# Patient Record
Sex: Male | Born: 1940
Health system: Southern US, Community
[De-identification: ages and names within clinical notes are randomized; demographics above are authoritative.]

## PROBLEM LIST (undated history)

## (undated) DIAGNOSIS — G4730 Sleep apnea, unspecified: Secondary | ICD-10-CM

## (undated) DIAGNOSIS — I10 Essential (primary) hypertension: Secondary | ICD-10-CM

## (undated) DIAGNOSIS — I639 Cerebral infarction, unspecified: Secondary | ICD-10-CM

## (undated) DIAGNOSIS — I480 Paroxysmal atrial fibrillation: Secondary | ICD-10-CM

## (undated) DIAGNOSIS — R609 Edema, unspecified: Secondary | ICD-10-CM

## (undated) DIAGNOSIS — IMO0002 Reserved for concepts with insufficient information to code with codable children: Secondary | ICD-10-CM

## (undated) DIAGNOSIS — I739 Peripheral vascular disease, unspecified: Secondary | ICD-10-CM

## (undated) DIAGNOSIS — J449 Chronic obstructive pulmonary disease, unspecified: Secondary | ICD-10-CM

## (undated) DIAGNOSIS — I2510 Atherosclerotic heart disease of native coronary artery without angina pectoris: Secondary | ICD-10-CM

## (undated) DIAGNOSIS — E785 Hyperlipidemia, unspecified: Secondary | ICD-10-CM

## (undated) DIAGNOSIS — I219 Acute myocardial infarction, unspecified: Secondary | ICD-10-CM

## (undated) DIAGNOSIS — K219 Gastro-esophageal reflux disease without esophagitis: Secondary | ICD-10-CM

## (undated) DIAGNOSIS — M1990 Unspecified osteoarthritis, unspecified site: Secondary | ICD-10-CM

## (undated) DIAGNOSIS — E119 Type 2 diabetes mellitus without complications: Secondary | ICD-10-CM

## (undated) DIAGNOSIS — I499 Cardiac arrhythmia, unspecified: Secondary | ICD-10-CM

## (undated) HISTORY — PX: TONSILLECTOMY: SUR1361

## (undated) HISTORY — PX: CORONARY ANGIOPLASTY WITH STENT PLACEMENT: SHX49

## (undated) HISTORY — PX: APPENDECTOMY: SHX54

## (undated) HISTORY — PX: BACK SURGERY: SHX140

## (undated) HISTORY — PX: CATARACT EXTRACTION W/ INTRAOCULAR LENS  IMPLANT, BILATERAL: SHX1307

## (undated) HISTORY — PX: HEMORRHOID SURGERY: SHX153

## (undated) SURGERY — PERIPHERAL VASCULAR THROMBECTOMY
Anesthesia: LOCAL

---

## 1976-06-23 HISTORY — PX: POSTERIOR LUMBAR FUSION: SHX6036

## 1979-06-24 HISTORY — PX: ANTERIOR LUMBAR FUSION: SHX1170

## 1994-06-23 DIAGNOSIS — I219 Acute myocardial infarction, unspecified: Secondary | ICD-10-CM

## 1994-06-23 HISTORY — DX: Acute myocardial infarction, unspecified: I21.9

## 2000-05-20 ENCOUNTER — Ambulatory Visit (HOSPITAL_BASED_OUTPATIENT_CLINIC_OR_DEPARTMENT_OTHER): Admission: RE | Admit: 2000-05-20 | Discharge: 2000-05-20 | Payer: Self-pay | Admitting: General Surgery

## 2011-12-10 DIAGNOSIS — E785 Hyperlipidemia, unspecified: Secondary | ICD-10-CM | POA: Diagnosis not present

## 2011-12-10 DIAGNOSIS — I10 Essential (primary) hypertension: Secondary | ICD-10-CM | POA: Diagnosis not present

## 2011-12-10 DIAGNOSIS — I739 Peripheral vascular disease, unspecified: Secondary | ICD-10-CM | POA: Diagnosis not present

## 2011-12-10 DIAGNOSIS — I2510 Atherosclerotic heart disease of native coronary artery without angina pectoris: Secondary | ICD-10-CM | POA: Diagnosis not present

## 2013-03-10 DIAGNOSIS — I70219 Atherosclerosis of native arteries of extremities with intermittent claudication, unspecified extremity: Secondary | ICD-10-CM | POA: Diagnosis not present

## 2013-03-10 DIAGNOSIS — T82898A Other specified complication of vascular prosthetic devices, implants and grafts, initial encounter: Secondary | ICD-10-CM | POA: Diagnosis not present

## 2013-03-11 DIAGNOSIS — I70309 Unspecified atherosclerosis of unspecified type of bypass graft(s) of the extremities, unspecified extremity: Secondary | ICD-10-CM | POA: Diagnosis not present

## 2013-03-11 DIAGNOSIS — T82898A Other specified complication of vascular prosthetic devices, implants and grafts, initial encounter: Secondary | ICD-10-CM | POA: Diagnosis not present

## 2013-03-12 DIAGNOSIS — T82898A Other specified complication of vascular prosthetic devices, implants and grafts, initial encounter: Secondary | ICD-10-CM | POA: Diagnosis not present

## 2013-03-13 DIAGNOSIS — T82898A Other specified complication of vascular prosthetic devices, implants and grafts, initial encounter: Secondary | ICD-10-CM | POA: Diagnosis not present

## 2014-07-02 ENCOUNTER — Emergency Department (HOSPITAL_COMMUNITY): Payer: Medicare Other

## 2014-07-02 ENCOUNTER — Observation Stay (HOSPITAL_COMMUNITY)
Admission: EM | Admit: 2014-07-02 | Discharge: 2014-07-04 | Disposition: A | Discharge status: Home / Self Care | Payer: Medicare Other | Attending: Internal Medicine | Admitting: Internal Medicine

## 2014-07-02 ENCOUNTER — Encounter (HOSPITAL_COMMUNITY): Payer: Self-pay | Admitting: Emergency Medicine

## 2014-07-02 DIAGNOSIS — R778 Other specified abnormalities of plasma proteins: Secondary | ICD-10-CM

## 2014-07-02 DIAGNOSIS — I2510 Atherosclerotic heart disease of native coronary artery without angina pectoris: Secondary | ICD-10-CM | POA: Insufficient documentation

## 2014-07-02 DIAGNOSIS — R6510 Systemic inflammatory response syndrome (SIRS) of non-infectious origin without acute organ dysfunction: Secondary | ICD-10-CM | POA: Diagnosis present

## 2014-07-02 DIAGNOSIS — I4891 Unspecified atrial fibrillation: Principal | ICD-10-CM | POA: Diagnosis present

## 2014-07-02 DIAGNOSIS — R000 Tachycardia, unspecified: Secondary | ICD-10-CM | POA: Diagnosis not present

## 2014-07-02 DIAGNOSIS — E1122 Type 2 diabetes mellitus with diabetic chronic kidney disease: Secondary | ICD-10-CM

## 2014-07-02 DIAGNOSIS — I25119 Atherosclerotic heart disease of native coronary artery with unspecified angina pectoris: Secondary | ICD-10-CM

## 2014-07-02 DIAGNOSIS — R748 Abnormal levels of other serum enzymes: Secondary | ICD-10-CM | POA: Diagnosis not present

## 2014-07-02 DIAGNOSIS — I480 Paroxysmal atrial fibrillation: Secondary | ICD-10-CM

## 2014-07-02 DIAGNOSIS — I959 Hypotension, unspecified: Secondary | ICD-10-CM

## 2014-07-02 DIAGNOSIS — R7989 Other specified abnormal findings of blood chemistry: Secondary | ICD-10-CM | POA: Diagnosis not present

## 2014-07-02 DIAGNOSIS — E119 Type 2 diabetes mellitus without complications: Secondary | ICD-10-CM | POA: Insufficient documentation

## 2014-07-02 DIAGNOSIS — E785 Hyperlipidemia, unspecified: Secondary | ICD-10-CM

## 2014-07-02 DIAGNOSIS — R262 Difficulty in walking, not elsewhere classified: Secondary | ICD-10-CM | POA: Insufficient documentation

## 2014-07-02 DIAGNOSIS — R05 Cough: Secondary | ICD-10-CM

## 2014-07-02 DIAGNOSIS — R079 Chest pain, unspecified: Secondary | ICD-10-CM | POA: Diagnosis present

## 2014-07-02 DIAGNOSIS — A419 Sepsis, unspecified organism: Secondary | ICD-10-CM | POA: Diagnosis not present

## 2014-07-02 DIAGNOSIS — I10 Essential (primary) hypertension: Secondary | ICD-10-CM

## 2014-07-02 DIAGNOSIS — N1831 Chronic kidney disease, stage 3a: Secondary | ICD-10-CM

## 2014-07-02 DIAGNOSIS — R059 Cough, unspecified: Secondary | ICD-10-CM

## 2014-07-02 DIAGNOSIS — N289 Disorder of kidney and ureter, unspecified: Secondary | ICD-10-CM

## 2014-07-02 DIAGNOSIS — D72829 Elevated white blood cell count, unspecified: Secondary | ICD-10-CM | POA: Diagnosis not present

## 2014-07-02 DIAGNOSIS — Z87891 Personal history of nicotine dependence: Secondary | ICD-10-CM | POA: Insufficient documentation

## 2014-07-02 DIAGNOSIS — R0789 Other chest pain: Secondary | ICD-10-CM | POA: Diagnosis not present

## 2014-07-02 HISTORY — DX: Hyperlipidemia, unspecified: E78.5

## 2014-07-02 HISTORY — DX: Unspecified osteoarthritis, unspecified site: M19.90

## 2014-07-02 HISTORY — DX: Cardiac arrhythmia, unspecified: I49.9

## 2014-07-02 HISTORY — DX: Edema, unspecified: R60.9

## 2014-07-02 HISTORY — DX: Paroxysmal atrial fibrillation: I48.0

## 2014-07-02 HISTORY — DX: Gastro-esophageal reflux disease without esophagitis: K21.9

## 2014-07-02 HISTORY — DX: Essential (primary) hypertension: I10

## 2014-07-02 HISTORY — DX: Atherosclerotic heart disease of native coronary artery without angina pectoris: I25.10

## 2014-07-02 HISTORY — DX: Reserved for concepts with insufficient information to code with codable children: IMO0002

## 2014-07-02 HISTORY — DX: Acute myocardial infarction, unspecified: I21.9

## 2014-07-02 HISTORY — DX: Peripheral vascular disease, unspecified: I73.9

## 2014-07-02 LAB — URINALYSIS, ROUTINE W REFLEX MICROSCOPIC
Glucose, UA: NEGATIVE mg/dL
HGB URINE DIPSTICK: NEGATIVE
Ketones, ur: NEGATIVE mg/dL
Leukocytes, UA: NEGATIVE
Nitrite: NEGATIVE
Protein, ur: NEGATIVE mg/dL
SPECIFIC GRAVITY, URINE: 1.021 (ref 1.005–1.030)
UROBILINOGEN UA: 0.2 mg/dL (ref 0.0–1.0)
pH: 5.5 (ref 5.0–8.0)

## 2014-07-02 LAB — APTT: aPTT: 44 seconds — ABNORMAL HIGH (ref 24–37)

## 2014-07-02 LAB — BASIC METABOLIC PANEL
Anion gap: 16 — ABNORMAL HIGH (ref 5–15)
BUN: 21 mg/dL (ref 6–23)
CHLORIDE: 105 meq/L (ref 96–112)
CO2: 19 mmol/L (ref 19–32)
CREATININE: 1.37 mg/dL — AB (ref 0.50–1.35)
Calcium: 9.3 mg/dL (ref 8.4–10.5)
GFR calc Af Amer: 57 mL/min — ABNORMAL LOW (ref >90)
GFR calc non Af Amer: 49 mL/min — ABNORMAL LOW (ref >90)
Glucose, Bld: 131 mg/dL — ABNORMAL HIGH (ref 70–99)
POTASSIUM: 4.7 mmol/L (ref 3.5–5.1)
SODIUM: 140 mmol/L (ref 135–145)

## 2014-07-02 LAB — I-STAT TROPONIN, ED
Troponin i, poc: 0.02 ng/mL (ref 0.00–0.08)
Troponin i, poc: 0.36 ng/mL (ref 0.00–0.08)
Troponin i, poc: 0.79 ng/mL (ref 0.00–0.08)

## 2014-07-02 LAB — PROTIME-INR
INR: 2.82 — AB (ref 0.00–1.49)
Prothrombin Time: 29.9 seconds — ABNORMAL HIGH (ref 11.6–15.2)

## 2014-07-02 LAB — CBC
HCT: 43.8 % (ref 39.0–52.0)
HEMOGLOBIN: 14.4 g/dL (ref 13.0–17.0)
MCH: 30.3 pg (ref 26.0–34.0)
MCHC: 32.9 g/dL (ref 30.0–36.0)
MCV: 92.2 fL (ref 78.0–100.0)
Platelets: 286 10*3/uL (ref 150–400)
RBC: 4.75 MIL/uL (ref 4.22–5.81)
RDW: 13.2 % (ref 11.5–15.5)
WBC: 27.3 10*3/uL — ABNORMAL HIGH (ref 4.0–10.5)

## 2014-07-02 LAB — TROPONIN I: TROPONIN I: 1.25 ng/mL — AB (ref <0.031)

## 2014-07-02 MED ORDER — INSULIN ASPART 100 UNIT/ML ~~LOC~~ SOLN
0.0000 [IU] | Freq: Three times a day (TID) | SUBCUTANEOUS | Status: DC
  Administered 2014-07-04: 2 [IU] via SUBCUTANEOUS

## 2014-07-02 MED ORDER — ASPIRIN EC 81 MG PO TBEC
81.0000 mg | DELAYED_RELEASE_TABLET | Freq: Every day | ORAL | Status: DC
  Administered 2014-07-03: 81 mg via ORAL
  Filled 2014-07-02: qty 1

## 2014-07-02 MED ORDER — WARFARIN SODIUM 5 MG PO TABS
5.0000 mg | ORAL_TABLET | ORAL | Status: AC
  Administered 2014-07-02: 5 mg via ORAL
  Filled 2014-07-02: qty 1

## 2014-07-02 MED ORDER — METOPROLOL TARTRATE 50 MG PO TABS
50.0000 mg | ORAL_TABLET | Freq: Two times a day (BID) | ORAL | Status: DC
  Administered 2014-07-02 – 2014-07-04 (×4): 50 mg via ORAL
  Filled 2014-07-02: qty 1
  Filled 2014-07-02: qty 2
  Filled 2014-07-02: qty 1
  Filled 2014-07-02: qty 2

## 2014-07-02 MED ORDER — TRAMADOL HCL 50 MG PO TABS: 50.0000 mg | ORAL_TABLET | Freq: Four times a day (QID) | ORAL | Status: DC | PRN

## 2014-07-02 MED ORDER — WARFARIN - PHARMACIST DOSING INPATIENT
Freq: Every day | Status: DC
Start: 2014-07-03 — End: 2014-07-03

## 2014-07-02 MED ORDER — LOSARTAN POTASSIUM 50 MG PO TABS
50.0000 mg | ORAL_TABLET | Freq: Two times a day (BID) | ORAL | Status: DC
  Administered 2014-07-02: 50 mg via ORAL
  Filled 2014-07-02 (×4): qty 1

## 2014-07-02 MED ORDER — CHLORTHALIDONE 25 MG PO TABS: 6.0000 mg | ORAL_TABLET | Freq: Every day | ORAL | Status: DC

## 2014-07-02 MED ORDER — DILTIAZEM HCL 25 MG/5ML IV SOLN
10.0000 mg | Freq: Once | INTRAVENOUS | Status: AC
  Administered 2014-07-02: 10 mg via INTRAVENOUS
  Filled 2014-07-02: qty 5

## 2014-07-02 MED ORDER — ASPIRIN 81 MG PO CHEW
324.0000 mg | CHEWABLE_TABLET | Freq: Once | ORAL | Status: AC
  Administered 2014-07-03: 324 mg via ORAL
  Filled 2014-07-02: qty 4

## 2014-07-02 MED ORDER — SODIUM CHLORIDE 0.9 % IV SOLN
1000.0000 mL | INTRAVENOUS | Status: DC
  Administered 2014-07-02: 1000 mL via INTRAVENOUS

## 2014-07-02 MED ORDER — ASPIRIN 81 MG PO CHEW: 324.0000 mg | CHEWABLE_TABLET | Freq: Once | ORAL | Status: DC

## 2014-07-02 MED ORDER — ACETAMINOPHEN 325 MG PO TABS
650.0000 mg | ORAL_TABLET | ORAL | Status: DC | PRN
  Administered 2014-07-04: 650 mg via ORAL
  Filled 2014-07-02: qty 2

## 2014-07-02 MED ORDER — OMEGA-3-ACID ETHYL ESTERS 1 G PO CAPS
1.0000 g | ORAL_CAPSULE | Freq: Every day | ORAL | Status: DC
  Administered 2014-07-03 – 2014-07-04 (×2): 1 g via ORAL
  Filled 2014-07-02 (×4): qty 1

## 2014-07-02 MED ORDER — EZETIMIBE 10 MG PO TABS
5.0000 mg | ORAL_TABLET | Freq: Every day | ORAL | Status: DC
  Administered 2014-07-03 – 2014-07-04 (×2): 5 mg via ORAL
  Filled 2014-07-02 (×2): qty 0.5
  Filled 2014-07-02: qty 1
  Filled 2014-07-02: qty 0.5

## 2014-07-02 MED ORDER — SODIUM CHLORIDE 0.9 % IJ SOLN
3.0000 mL | Freq: Two times a day (BID) | INTRAMUSCULAR | Status: DC
  Administered 2014-07-02 – 2014-07-04 (×4): 3 mL via INTRAVENOUS
  Filled 2014-07-02: qty 3

## 2014-07-02 MED ORDER — SODIUM CHLORIDE 0.9 % IV BOLUS (SEPSIS)
500.0000 mL | Freq: Once | INTRAVENOUS | Status: AC
  Administered 2014-07-02: 500 mL via INTRAVENOUS

## 2014-07-02 MED ORDER — ONDANSETRON HCL 4 MG/2ML IJ SOLN: 4.0000 mg | Freq: Four times a day (QID) | INTRAMUSCULAR | Status: DC | PRN

## 2014-07-02 MED ORDER — CLOPIDOGREL BISULFATE 75 MG PO TABS
75.0000 mg | ORAL_TABLET | Freq: Every day | ORAL | Status: DC
  Administered 2014-07-03 – 2014-07-04 (×2): 75 mg via ORAL
  Filled 2014-07-02 (×2): qty 1

## 2014-07-02 MED ORDER — GABAPENTIN 300 MG PO CAPS
300.0000 mg | ORAL_CAPSULE | Freq: Every day | ORAL | Status: DC
  Administered 2014-07-03 – 2014-07-04 (×2): 300 mg via ORAL
  Filled 2014-07-02 (×2): qty 1

## 2014-07-02 MED ORDER — NITROGLYCERIN 0.4 MG SL SUBL: 0.4000 mg | SUBLINGUAL_TABLET | SUBLINGUAL | Status: DC | PRN

## 2014-07-02 MED ORDER — PANTOPRAZOLE SODIUM 40 MG PO TBEC
40.0000 mg | DELAYED_RELEASE_TABLET | Freq: Every day | ORAL | Status: DC
  Administered 2014-07-03 – 2014-07-04 (×2): 40 mg via ORAL
  Filled 2014-07-02 (×2): qty 1

## 2014-07-02 NOTE — Progress Notes (Signed)
ANTICOAGULATION CONSULT NOTE - Initial Consult  Pharmacy Consult for Coumadin Indication: atrial fibrillation  Allergies  Allergen Reactions  . Statins Other (See Comments)    Muscle and Bone pain    Patient Measurements: Height: 5\' 9"  (175.3 cm) Weight: 166 lb (75.297 kg) IBW/kg (Calculated) : 70.7  Vital Signs: Temp: 97.7 F (36.5 C) (01/10 1702) Temp Source: Oral (01/10 1702) BP: 116/53 mmHg (01/10 2200) Pulse Rate: 85 (01/10 2200)  Labs:  Recent Labs  07/02/14 1150  HGB 14.4  HCT 43.8  PLT 286  APTT 44*  LABPROT 29.9*  INR 2.82*  CREATININE 1.37*    Estimated Creatinine Clearance: 47.3 mL/min (by C-G formula based on Cr of 1.37).   Medical History: Past Medical History  Diagnosis Date  . PAF (paroxysmal atrial fibrillation)   . Coronary artery disease     a. MI s/p balloon 1996, details unclear.  . Hypertension   . Diabetes mellitus without complication   . PAD (peripheral artery disease)     a. s/p stenting 08/2012, 02/2013.   . Thrombosis of lower extremity     a. Listed on patient's medical bracelet - at [place].  Marland Kitchen Hyperlipidemia   . Chronic edema     a. Chronic RLE edema.    Assessment: 74 year old male admitted on Coumadin PTA for Afib admitted with Afib with RVR and cold congestion type symptoms  INR = 2.82 on admission Home dose = 5 mg on Tuesday, 7.5 mg other days (last dose 1/9)  Goal of Therapy:  INR 2-3 Monitor platelets by anticoagulation protocol: Yes   Plan:  Coumadin 5 mg po x 1 dose now (INR on the high end) Daily INR  Thank you. Anette Guarneri, PharmD [phone]  07/02/2014,10:22 PM

## 2014-07-02 NOTE — ED Provider Notes (Signed)
  Physical Exam  BP 122/63 mmHg  Pulse 80  Temp(Src) 97.7 F (36.5 C) (Oral)  Resp 20  Ht 5\' 9"  (1.753 m)  Wt 166 lb (75.297 kg)  BMI 24.50 kg/m2  SpO2 95%  Physical Exam  ED Course  Procedures  MDM Cardiology called me about Mr. Brabson. States that they have seen the patient and since he has a white count of 27 now requested he be admitted to internal medicine. Patient had a cough a month and half ago but has not had any since. Denies any changes in his urinary symptoms. No nausea vomiting diarrhea.      Jasper Riling. Alvino Chapel, MD 07/02/14 [phone]

## 2014-07-02 NOTE — ED Notes (Signed)
Dr Alvino Chapel given a copy of troponin results .[place]

## 2014-07-02 NOTE — H&P (Signed)
[facility] History and Physical  Donald Gordon VPX:[id number] DOB: May 29, 1941 DOA: 07/02/2014  Referring physician: ER physician. PCP: No PCP Per Patient   Chief Complaint: Cold sweaty and congestion.  HPI: Donald Gordon is a 74 y.o. male with history of atrial fibrillation, peripheral vascular disease status post stenting, CAD status post balloon angioplasty in 1996, hypertension and diabetes mellitus type 2 was brought to the ER after patient was having some chest congestion and palpitation. Patient was found to be in atrial fibrillation with RVR and patient was given Cardizem bolus following which patient reverted back to normal sinus rhythm. Patient's troponin was found to be elevated and on call cardiologist was consulted. Patient states that he woke up this morning around 1 AM with cold and chills and aching elbows. Following which eventually patient started developing some congestion by a morning with some palpitation. Patient took some sublingual nitroglycerin and aspirin and called EMS and patient was brought to the ER. In the ER patient was found to be in A. fib with RVR which converted back to sinus rhythm after Cardizem bolus. Patient denies any nausea vomiting abdominal pain diarrhea productive cough. Does not have any dysuria or discharge. Patient's lab work showed elevated troponin with markedly elevated WBC count. Patient has been afebrile. Chest x-ray and UA was unremarkable.   Review of Systems: As presented in the history of presenting illness, rest negative.  Past Medical History  Diagnosis Date  . PAF (paroxysmal atrial fibrillation)   . Coronary artery disease     a. MI s/p balloon 1996, details unclear.  . Hypertension   . Diabetes mellitus without complication   . PAD (peripheral artery disease)     a. s/p stenting 08/2012, 02/2013.   . Thrombosis of lower extremity     a. Listed on patient's medical bracelet - at [place].  Marland Kitchen Hyperlipidemia   . Chronic edema     a.  Chronic RLE edema.   Past Surgical History  Procedure Laterality Date  . Back surgery  1978, 1981   Social History:  reports that he has quit smoking. He does not have any smokeless tobacco history on file. He reports that he does not drink alcohol or use illicit drugs. Where does patient live home. Can patient participate in ADLs? Yes.  Allergies  Allergen Reactions  . Statins Other (See Comments)    Muscle and Bone pain    Family History:  Family History  Problem Relation Age of Onset  . CAD Mother   . CAD Brother   . Hypertension        Prior to Admission medications   Medication Sig Start Date End Date Taking? Authorizing Provider  aspirin 325 MG tablet Take 325 mg by mouth daily.   Yes Historical Provider, MD  chlorthalidone (HYGROTON) 25 MG tablet Take 6 mg by mouth daily. Pt breaks med up into 1/4 tabs   Yes Historical Provider, MD  clopidogrel (PLAVIX) 75 MG tablet Take 75 mg by mouth daily.   Yes Historical Provider, MD  ezetimibe (ZETIA) 10 MG tablet Take 5 mg by mouth daily.   Yes Historical Provider, MD  gabapentin (NEURONTIN) 300 MG capsule Take 300 mg by mouth daily.   Yes Historical Provider, MD  losartan (COZAAR) 50 MG tablet Take 50 mg by mouth 2 (two) times daily.   Yes Historical Provider, MD  metFORMIN (GLUCOPHAGE) 500 MG tablet Take 500 mg by mouth 2 (two) times daily with a meal.   Yes  Historical Provider, MD  metoprolol (LOPRESSOR) 50 MG tablet Take 50 mg by mouth 2 (two) times daily.   Yes Historical Provider, MD  Multiple Vitamins-Minerals (MULTIVITAMIN WITH MINERALS) tablet Take 1 tablet by mouth daily.   Yes Historical Provider, MD  nitroGLYCERIN (NITROSTAT) 0.4 MG SL tablet Place 0.4 mg under the tongue every 5 (five) minutes as needed for chest pain.   Yes Historical Provider, MD  Omega-3 Fatty Acids (FISH OIL PO) Take 1 capsule by mouth daily.   Yes Historical Provider, MD  pantoprazole (PROTONIX) 40 MG tablet Take 40 mg by mouth daily.   Yes  Historical Provider, MD  traMADol (ULTRAM) 50 MG tablet Take 50 mg by mouth every 6 (six) hours as needed for moderate pain.   Yes Historical Provider, MD  warfarin (COUMADIN) 5 MG tablet Take 5-7.5 mg by mouth daily. Takes 5 mg on Tuesday, all other days takes 7.5 mg   Yes Historical Provider, MD    Physical Exam: Filed Vitals:   07/02/14 1800 07/02/14 1830 07/02/14 1901 07/02/14 1956  BP: 109/86 131/60 139/58 123/64  Pulse: 81 85 86 89  Temp:      TempSrc:      Resp: 17 21 22 19   Height:      Weight:      SpO2: 99% 96% 94% 95%     General:  Moderately built and nourished.  Eyes: Anicteric no pallor.  ENT: No discharge from the ears eyes nose or mouth.  Neck: No mass felt.  Cardiovascular: S1 and S2 heard.  Respiratory: No rhonchi or crepitations.  Abdomen: Soft nontender bowel sounds present.  Skin: No rash.  Musculoskeletal: No edema.  Psychiatric: Appears normal.  Neurologic: Alert awake oriented to time place and person. Moves all extremities. No facial asymmetry. Tongue is midline.  Labs on Admission:  Basic Metabolic Panel:  Recent Labs Lab 07/02/14 1150  NA 140  K 4.7  CL 105  CO2 19  GLUCOSE 131*  BUN 21  CREATININE 1.37*  CALCIUM 9.3   Liver Function Tests: No results for input(s): AST, ALT, ALKPHOS, BILITOT, PROT, ALBUMIN in the last 168 hours. No results for input(s): LIPASE, AMYLASE in the last 168 hours. No results for input(s): AMMONIA in the last 168 hours. CBC:  Recent Labs Lab 07/02/14 1150  WBC 27.3*  HGB 14.4  HCT 43.8  MCV 92.2  PLT 286   Cardiac Enzymes: No results for input(s): CKTOTAL, CKMB, CKMBINDEX, TROPONINI in the last 168 hours.  BNP (last 3 results) No results for input(s): PROBNP in the last 8760 hours. CBG: No results for input(s): GLUCAP in the last 168 hours.  Radiological Exams on Admission: Dg Chest Port 1 View  07/02/2014   CLINICAL DATA:  Chest pain, tachycardia  EXAM: PORTABLE CHEST - 1 VIEW   COMPARISON:  None.  FINDINGS: Chronic interstitial markings. No focal consolidation. No pleural effusion or pneumothorax.  The heart is top-normal in size.  IMPRESSION: No evidence of acute cardiopulmonary disease.   Electronically Signed   By: Julian Hy M.D.   On: 07/02/2014 13:39    EKG: Independently reviewed. First EKG showed A. fib with RVR. Second EKG shows normal sinus rhythm with nonspecific ST-T changes.  Assessment/Plan Principal Problem:   Atrial fibrillation with RVR Active Problems:   Leukocytosis   Paroxysmal atrial fibrillation - with RVR   Elevated troponin   Renal insufficiency   Essential hypertension   Hypotension   CAD (coronary artery disease)   Cough  Hyperlipemia   SIRS (systemic inflammatory response syndrome)   Diabetes mellitus type 2, controlled   1. Atrial fibrillation with RVR - at this time is converted back to normal sinus rhythm. Patient is on metoprolol which will be continued. Coumadin per pharmacy. If patient's creatinine worsened and may change to heparin infusion in anticipation of possible cardiac procedures. 2. Elevated troponin with history of CAD - at this time we will cycle cardiac markers. Check 2-D echo. Patient is on Plavix and aspirin. Presently chest pain-free. 3. SIRS - patient has markedly elevated white blood cell count. Patient is afebrile. Patient does not have any history of leukemia. Blood cultures have been obtained. Closely follow CBC trends. Check influenza PCR. 4. Hypertension - since patient has low normal blood pressure I'm holding off Cozaar and HCT for now and closely follow blood pressure trends. 5. Diabetes mellitus type 2 - since patient's creatinine is mildly elevated and in anticipation of possible cardiac procedures we will hold off metformin for now. Sliding scale coverage. 6. Renal insufficiency duration not clear - closely follow intake output and metabolic panel. 7. Peripheral vascular disease status post  stenting - presently on anticoagulants and antiplatelet agents.  Appreciate cardiology consult.   DVT Prophylaxis patient is on full dose anticoagulants.  Code Status: Full code.  Family Communication: None.  Disposition Plan: Admit to inpatient.    Mackinzie Vuncannon N. [facility] Pager [phone].  If 7PM-7AM, please contact night-coverage [email or web address] Password [email or web address] 07/02/2014, 9:08 PM

## 2014-07-02 NOTE — ED Notes (Signed)
EKG completed and given to EDP.  

## 2014-07-02 NOTE — ED Notes (Signed)
Patient states does not feel any pain 0/10 checked own pulses and said " I bet my heart is back in rhythm." Alert answering and following commands appropriate.

## 2014-07-02 NOTE — ED Notes (Signed)
Onset today at midnight chest pain 4/10 feels palpitations called EMS Afib rvr per EMS patient took own aspirin 618ms aspirin prior to arrival. Alert answering and following commands appropriate.

## 2014-07-02 NOTE — ED Provider Notes (Signed)
CSN: [id number]     Arrival date & time 07/02/14  1127 History   First MD Initiated Contact with Patient 07/02/14 1139     Chief Complaint  Patient presents with  . Tachycardia  . Chest Pain   HPI Last evening the patient woke up feeling chilled. He thought he was feverish but when he felt his arms and legs they were normal. Patient eventually was able to go back to sleep. He woke up this morning he felt his heart racing and was having pain in his chest that was radiating to both elbows. Patient thought that he was in atrial fibrillation. He has a history of perioperative atrial fibrillation in the past. The symptoms today feel similar.  The patient is still having symptoms. Pain in his chest is similar to when you are running real hard and get out of breath. He denies any nausea or vomiting. He has a history of coronary artery disease but never has had stents or bypass. His cardiac care is pain through the [place]. He does take Coumadin. Past Medical History  Diagnosis Date  . A-fib   . Coronary artery disease   . Hypertension   . Diabetes mellitus without complication    Past Surgical History  Procedure Laterality Date  . Back surgery     No family history on file. History  Substance Use Topics  . Smoking status: Former Research scientist (life sciences)  . Smokeless tobacco: Not on file  . Alcohol Use: No    Review of Systems  All other systems reviewed and are negative.     Allergies  Statins  Home Medications   Prior to Admission medications   Not on File   BP 111/63 mmHg  Pulse 79  Temp(Src) 98.1 F (36.7 C) (Oral)  Resp 16  Ht 5\' 9"  (1.753 m)  Wt 166 lb (75.297 kg)  BMI 24.50 kg/m2  SpO2 97% Physical Exam  Constitutional: He appears well-developed and well-nourished. No distress.  HENT:  Head: Normocephalic and atraumatic.  Right Ear: External ear normal.  Left Ear: External ear normal.  Eyes: Conjunctivae are normal. Right eye exhibits no discharge. Left eye exhibits no discharge. No  scleral icterus.  Neck: Neck supple. No tracheal deviation present.  Cardiovascular: Normal rate and intact distal pulses.  An irregularly irregular rhythm present.  Pulmonary/Chest: Effort normal and breath sounds normal. No stridor. No respiratory distress. He has no wheezes. He has no rales.  Abdominal: Soft. Bowel sounds are normal. He exhibits no distension. There is no tenderness. There is no rebound and no guarding.  Musculoskeletal: He exhibits no edema or tenderness.  Neurological: He is alert. He has normal strength. No cranial nerve deficit (no facial droop, extraocular movements intact, no slurred speech) or sensory deficit. He exhibits normal muscle tone. He displays no seizure activity. Coordination normal.  Skin: Skin is warm and dry. No rash noted.  Psychiatric: He has a normal mood and affect.  Nursing note and vitals reviewed.   ED Course  Procedures (including critical care time) Labs Review Labs Reviewed  BASIC METABOLIC PANEL - Abnormal; Notable for the following:    Glucose, Bld 131 (*)    Creatinine, Ser 1.37 (*)    GFR calc non Af Amer 49 (*)    GFR calc Af Amer 57 (*)    Anion gap 16 (*)    All other components within normal limits  CBC - Abnormal; Notable for the following:    WBC 27.3 (*)  All other components within normal limits  PROTIME-INR - Abnormal; Notable for the following:    Prothrombin Time 29.9 (*)    INR 2.82 (*)    All other components within normal limits  APTT - Abnormal; Notable for the following:    aPTT 44 (*)    All other components within normal limits  I-STAT TROPOININ, ED - Abnormal; Notable for the following:    Troponin i, poc 0.36 (*)    All other components within normal limits  APTT  I-STAT TROPOININ, ED  I-STAT TROPOININ, ED  Randolm Idol, ED    Imaging Review Dg Chest Port 1 View  07/02/2014   CLINICAL DATA:  Chest pain, tachycardia  EXAM: PORTABLE CHEST - 1 VIEW  COMPARISON:  None.  FINDINGS: Chronic  interstitial markings. No focal consolidation. No pleural effusion or pneumothorax.  The heart is top-normal in size.  IMPRESSION: No evidence of acute cardiopulmonary disease.   Electronically Signed   By: Julian Hy M.D.   On: 07/02/2014 13:39     EKG Interpretation   Date/Time:  Sunday July 02 2014 11:30:13 EST Ventricular Rate:  153 PR Interval:    QRS Duration: 78 QT Interval:  324 QTC Calculation: 517 R Axis:   46 Text Interpretation:  Atrial fibrillation with rapid V-rate ST depression,  probably rate related No previous tracing Confirmed by Bellamy Judson  MD-J, Ahyana Skillin  ([id number]) on 07/02/2014 11:35:22 AM      EKG Interpretation  Date/Time:  Sunday July 02 2014 13:11:46 EST Ventricular Rate:  77 PR Interval:  153 QRS Duration: 90 QT Interval:  393 QTC Calculation: 445 R Axis:   61 Text Interpretation:  Sinus rhythm Borderline repolarization abnormality atrial fibrillation resolved sine last tracing Confirmed by Shamal Stracener  MD-J, Chanel Mcadams ([id number]) on 07/02/2014 1:20:51 PM         MDM   Final diagnoses:  Atrial fibrillation with rapid ventricular response  Elevated troponin  Leukocytosis    Repeat BP is over 100.  Will give fluid bolus and a low dose of cardizem.  Will need to watch his blood pressure.  Will check INR but patient may be a candidate for cardioversion.  Pt's A fib spontaneously resolved in the ED.  Back in sinus rhythm.  Pain in elbows and chest resolved however troponin is elevated.  Will consult with cardiology.   Dorie Rank, MD 07/03/14 [phone]

## 2014-07-02 NOTE — Consult Note (Signed)
Cardiology Consultation Note  Patient ID: Donald Gordon, MRN: [id number], DOB/AGE: 1941-03-29 74 y.o. Admit date: 07/02/2014   Date of Consult: 07/02/2014 Primary Physician: No PCP Per Patient Primary Cardiologist: Remotely Dr. Lia Foyer. Followed at [facility]. INR followed by [facility].  Chief Complaint: felt cold, sweaty, elbows aching Reason for Consult: AF RVR  HPI: Donald Gordon is a 74 y/o M with history of CAD (s/p balloon angioplasty in 1996), HTN, DM, HLD, PAF, PVD (s/p stenting to LE 08/2012, 02/2013, with LE thrombosis listed on patient's medical necklace), and 30 years of former tobacco abuse who presented to [facility] with AF RVR and marked leukocytosis. Records unavailable from the [place]. He says he hasn't seen a cardiologist in about 2 years due to his [place] doctor retiring. He says he was diagnosed with AF several years ago and believes his last episode to be in 02/2013 at time of LE intervention. He is maintained on Plavix and warfarin. He awoke at 2 am with the sensation of feeling cold, sweaty, and his elbows aching. He tried to change positions in bed but found no relief. He described a chest sensation like "congestion, when you walk out into the real cold." He felt somewhat SOB but denied palpitations. He took ASA, SL NTG and his usual BP meds without relief so 911 was called. They instructed family to administer 324mg  ASA. EMS brought the patient to [facility] where he was noted to be in AF RVR 150s with soft BPs. He was given NS bolus and 10mg  IV cardizem with spontaneous DCCV to NSR about a half hour later. The patient noted abrupt cessation of his symptoms of feeling cold/congestion/elbows aching as soon as he converted.  Labs notable for POC troponin 0.02->0.36. INR 2.82. Cr 1.37. WBC 27.3, normal Hgb/Plt. He denies any history of leukemia or blood disorder. Denies weight loss. CXR chronic interstitial markings, no acute changes. He has noted a chronic nonproductive cough  since Thanksgiving that he has been unable to shake.  Otherwise he denies acute complaint.   Past Medical History  Diagnosis Date  . PAF (paroxysmal atrial fibrillation)   . Coronary artery disease     a. MI s/p balloon 1996, details unclear.  . Hypertension   . Diabetes mellitus without complication   . PAD (peripheral artery disease)     a. s/p stenting 08/2012, 02/2013.   . Thrombosis of lower extremity     a. Listed on patient's medical bracelet - at [place].  Marland Kitchen Hyperlipidemia   . Chronic edema     a. Chronic RLE edema.      Most Recent Cardiac Studies: None available   Surgical History:  Past Surgical History  Procedure Laterality Date  . Back surgery  1978, 1981     Home Meds: Prior to Admission medications   Medication Sig Start Date End Date Taking? Authorizing Provider  aspirin 325 MG tablet Take 325 mg by mouth daily.   Yes Historical Provider, MD  chlorthalidone (HYGROTON) 25 MG tablet Take 6 mg by mouth daily. Pt breaks med up into 1/4 tabs   Yes Historical Provider, MD  clopidogrel (PLAVIX) 75 MG tablet Take 75 mg by mouth daily.   Yes Historical Provider, MD  ezetimibe (ZETIA) 10 MG tablet Take 5 mg by mouth daily.   Yes Historical Provider, MD  gabapentin (NEURONTIN) 300 MG capsule Take 300 mg by mouth daily.   Yes Historical Provider, MD  losartan (COZAAR) 50 MG tablet Take 50  mg by mouth 2 (two) times daily.   Yes Historical Provider, MD  metFORMIN (GLUCOPHAGE) 500 MG tablet Take 500 mg by mouth 2 (two) times daily with a meal.   Yes Historical Provider, MD  metoprolol (LOPRESSOR) 50 MG tablet Take 50 mg by mouth 2 (two) times daily.   Yes Historical Provider, MD  Multiple Vitamins-Minerals (MULTIVITAMIN WITH MINERALS) tablet Take 1 tablet by mouth daily.   Yes Historical Provider, MD  nitroGLYCERIN (NITROSTAT) 0.4 MG SL tablet Place 0.4 mg under the tongue every 5 (five) minutes as needed for chest pain.   Yes Historical Provider, MD  Omega-3 Fatty Acids (FISH OIL  PO) Take 1 capsule by mouth daily.   Yes Historical Provider, MD  pantoprazole (PROTONIX) 40 MG tablet Take 40 mg by mouth daily.   Yes Historical Provider, MD  traMADol (ULTRAM) 50 MG tablet Take 50 mg by mouth every 6 (six) hours as needed for moderate pain.   Yes Historical Provider, MD  warfarin (COUMADIN) 5 MG tablet Take 5-7.5 mg by mouth daily. Takes 5 mg on Tuesday, all other days takes 7.5 mg   Yes Historical Provider, MD    Inpatient Medications:  . aspirin  324 mg Oral Once  . aspirin  324 mg Oral Once   . sodium chloride 1,000 mL (Jul 12, 2014 1159)    Allergies:  Allergies  Allergen Reactions  . Statins Other (See Comments)    Muscle and Bone pain    History   Social History  . Marital Status: Legally Separated    Spouse Name: N/A    Number of Children: N/A  . Years of Education: N/A   Occupational History  . Not on file.   Social History Main Topics  . Smoking status: Former Research scientist (life sciences)  . Smokeless tobacco: Not on file     Comment: Smoked for 30 years, quit 1990s  . Alcohol Use: No     Comment: Remote alcohol use  . Drug Use: No  . Sexual Activity: Not on file   Other Topics Concern  . Not on file   Social History Narrative  . No narrative on file     Family History  Problem Relation Age of Onset  . CAD Mother   . CAD Brother   . Hypertension       Review of Systems:negative for orthopnea. He reports intermittent trace RLE edema which was present since LE stenting. No BRBPR, melena, hematemesis. No fevers or chills. All other systems reviewed and are otherwise negative except as noted above.  Labs:  Lab Results  Component Value Date   WBC 27.3* 07-12-2014   HGB 14.4 07/12/14   HCT 43.8 2014/07/12   MCV 92.2 12-Jul-2014   PLT 286 Jul 12, 2014    Recent Labs Lab 07/12/14 1150  NA 140  K 4.7  CL 105  CO2 19  BUN 21  CREATININE 1.37*  CALCIUM 9.3  GLUCOSE 131*   POC troponin 0.02->0.36.  Radiology/Studies:  Dg Chest Port 1  View  07/12/14   CLINICAL DATA:  Chest pain, tachycardia  EXAM: PORTABLE CHEST - 1 VIEW  COMPARISON:  None.  FINDINGS: Chronic interstitial markings. No focal consolidation. No pleural effusion or pneumothorax.  The heart is top-normal in size.  IMPRESSION: No evidence of acute cardiopulmonary disease.   Electronically Signed   By: Julian Hy M.D.   On: 2014-07-12 13:39   EKG:  1) atrial fib RVR 153bpm, subtle ST depression in II, V5-V6, TWI avL 2) NSR  77bpm, subtle ST sagging V3-V6, TWI avL, nonspecific T wave change I  Physical Exam: Blood pressure 116/57, pulse 79, temperature 98.1 F (36.7 C), temperature source Oral, resp. rate 21, height 5\' 9"  (1.753 m), weight 166 lb (75.297 kg), SpO2 96 %. General: Well developed, well nourished WM, in no acute distress. Head: Normocephalic, atraumatic, sclera non-icteric, no xanthomas, nares are without discharge.  Neck: Negative for carotid bruits. JVD not elevated. Lungs: Clear bilaterally to auscultation without wheezes, rales, or rhonchi. Breathing is unlabored. Heart: RRR with S1 S2. No murmurs, rubs, or gallops appreciated. Abdomen: Soft, non-tender, non-distended with normoactive bowel sounds. No hepatomegaly. No rebound/guarding. No obvious abdominal masses. Msk:  Strength and tone appear normal for age. Extremities: No clubbing or cyanosis. No edema.  Distal pedal pulses are 1+ on the left and difficult to appreciate on the right. Warm bilaterally with preserved pedal hair pattern. Neuro: Alert and oriented X 3. No facial asymmetry. No focal deficit. Moves all extremities spontaneously. Psych:  Responds to questions appropriately with a normal affect.   Assessment and Plan:   1. Recurrent AF with RVR, converted to NSR spontaneously in the ED 2. Marked leukocytosis of unclear cause 3. CAD s/p remote PTCA 1996, mildly elevated troponin 4. Recent cough >2 months 5. PAD s/p intervention 6. HTN with soft BP while in AF 7.  Hyperlipidemia, reportedly intolerant of statins 8. Renal insufficiency of unclear duration  Symptoms of elbow discomfort, "cold/congestion," and diaphoresis are likely attributed to AF as they resolved abruptly upon conversion in to NSR in the ED. The patient feels this is the first time he's had a recurrence of AF since 02/2013. At present time would observe for further episodes before determining if he requires anti-arrhythmic therapy. He is presently asymptomatic and denies any recent exertional CP or dyspnea. Troponin elevation may be rate-related but we recommend to cycle enzymes. Would continue Coumadin at present time but if chest discomfort develops or troponin rises significantly, we can hold and consider further ischemic evaluation. If troponins remain plateaued would consider stress testing as inpatient. Would decrease aspirin to 81mg  upon admission since he is presently on triple anticoagulant therapy. May need to determine if this is still necessarily before he goes home.   At hand we also have a markedly elevated WBC count of 27k without known history of hematologic malignancy. He also reports an unresolving cough for 2 months. This may be the bigger issue at hand since his cardiac symptoms have resolved. Recommend IM admission for further workup of these items. We will follow.  Signed, Melina Copa PA-C 07/02/2014, 4:45 PM

## 2014-07-02 NOTE — ED Notes (Signed)
Dr Alvino Chapel given a copy of the troponin results .Botines

## 2014-07-03 ENCOUNTER — Encounter (HOSPITAL_COMMUNITY): Payer: Self-pay | Admitting: General Practice

## 2014-07-03 DIAGNOSIS — N289 Disorder of kidney and ureter, unspecified: Secondary | ICD-10-CM | POA: Diagnosis not present

## 2014-07-03 DIAGNOSIS — I2510 Atherosclerotic heart disease of native coronary artery without angina pectoris: Secondary | ICD-10-CM | POA: Diagnosis not present

## 2014-07-03 DIAGNOSIS — D72829 Elevated white blood cell count, unspecified: Secondary | ICD-10-CM | POA: Diagnosis not present

## 2014-07-03 DIAGNOSIS — E785 Hyperlipidemia, unspecified: Secondary | ICD-10-CM | POA: Diagnosis not present

## 2014-07-03 DIAGNOSIS — I10 Essential (primary) hypertension: Secondary | ICD-10-CM | POA: Diagnosis not present

## 2014-07-03 DIAGNOSIS — I4891 Unspecified atrial fibrillation: Secondary | ICD-10-CM | POA: Insufficient documentation

## 2014-07-03 DIAGNOSIS — R7989 Other specified abnormal findings of blood chemistry: Secondary | ICD-10-CM | POA: Diagnosis not present

## 2014-07-03 DIAGNOSIS — E119 Type 2 diabetes mellitus without complications: Secondary | ICD-10-CM | POA: Diagnosis not present

## 2014-07-03 LAB — COMPREHENSIVE METABOLIC PANEL
ALBUMIN: 3.1 g/dL — AB (ref 3.5–5.2)
ALK PHOS: 55 U/L (ref 39–117)
ALT: 16 U/L (ref 0–53)
AST: 23 U/L (ref 0–37)
Anion gap: 8 (ref 5–15)
BILIRUBIN TOTAL: 0.3 mg/dL (ref 0.3–1.2)
BUN: 26 mg/dL — AB (ref 6–23)
CO2: 25 mmol/L (ref 19–32)
CREATININE: 0.9 mg/dL (ref 0.50–1.35)
Calcium: 8.4 mg/dL (ref 8.4–10.5)
Chloride: 104 mEq/L (ref 96–112)
GFR calc non Af Amer: 82 mL/min — ABNORMAL LOW (ref >90)
Glucose, Bld: 120 mg/dL — ABNORMAL HIGH (ref 70–99)
POTASSIUM: 3.7 mmol/L (ref 3.5–5.1)
Sodium: 137 mmol/L (ref 135–145)
TOTAL PROTEIN: 5.5 g/dL — AB (ref 6.0–8.3)

## 2014-07-03 LAB — LIPID PANEL
CHOLESTEROL: 134 mg/dL (ref 0–200)
HDL: 40 mg/dL (ref >39)
LDL Cholesterol: 70 mg/dL (ref 0–99)
Total CHOL/HDL Ratio: 3.4 RATIO
Triglycerides: 118 mg/dL (ref <150)
VLDL: 24 mg/dL (ref 0–40)

## 2014-07-03 LAB — CBC WITH DIFFERENTIAL/PLATELET
Basophils Absolute: 0 10*3/uL (ref 0.0–0.1)
Basophils Relative: 0 % (ref 0–1)
EOS ABS: 0.1 10*3/uL (ref 0.0–0.7)
Eosinophils Relative: 1 % (ref 0–5)
HEMATOCRIT: 37.1 % — AB (ref 39.0–52.0)
HEMOGLOBIN: 12.1 g/dL — AB (ref 13.0–17.0)
LYMPHS PCT: 16 % (ref 12–46)
Lymphs Abs: 2.2 10*3/uL (ref 0.7–4.0)
MCH: 30.3 pg (ref 26.0–34.0)
MCHC: 32.6 g/dL (ref 30.0–36.0)
MCV: 93 fL (ref 78.0–100.0)
Monocytes Absolute: 2 10*3/uL — ABNORMAL HIGH (ref 0.1–1.0)
Monocytes Relative: 15 % — ABNORMAL HIGH (ref 3–12)
NEUTROS ABS: 9.4 10*3/uL — AB (ref 1.7–7.7)
Neutrophils Relative %: 68 % (ref 43–77)
Platelets: 225 10*3/uL (ref 150–400)
RBC: 3.99 MIL/uL — ABNORMAL LOW (ref 4.22–5.81)
RDW: 13.5 % (ref 11.5–15.5)
WBC: 13.8 10*3/uL — ABNORMAL HIGH (ref 4.0–10.5)

## 2014-07-03 LAB — PROTIME-INR
INR: 3 — ABNORMAL HIGH (ref 0.00–1.49)
Prothrombin Time: 31.4 seconds — ABNORMAL HIGH (ref 11.6–15.2)

## 2014-07-03 LAB — CBG MONITORING, ED
GLUCOSE-CAPILLARY: 98 mg/dL (ref 70–99)
Glucose-Capillary: 93 mg/dL (ref 70–99)

## 2014-07-03 LAB — INFLUENZA PANEL BY PCR (TYPE A & B)
H1N1 flu by pcr: NOT DETECTED
Influenza A By PCR: NEGATIVE
Influenza B By PCR: NEGATIVE

## 2014-07-03 LAB — TSH: TSH: 2.486 u[IU]/mL (ref 0.350–4.500)

## 2014-07-03 LAB — TROPONIN I
Troponin I: 1.38 ng/mL (ref <0.031)
Troponin I: 0.83 ng/mL (ref <0.031)

## 2014-07-03 LAB — GLUCOSE, CAPILLARY
GLUCOSE-CAPILLARY: 102 mg/dL — AB (ref 70–99)
Glucose-Capillary: 172 mg/dL — ABNORMAL HIGH (ref 70–99)

## 2014-07-03 LAB — T4, FREE: Free T4: 1.12 ng/dL (ref 0.80–1.80)

## 2014-07-03 MED ORDER — MENTHOL 3 MG MT LOZG
1.0000 | LOZENGE | OROMUCOSAL | Status: DC | PRN
  Administered 2014-07-03: 3 mg via ORAL
  Filled 2014-07-03: qty 9

## 2014-07-03 NOTE — ED Notes (Signed)
Okay'd to transport by teletech to floor by [employer], RN

## 2014-07-03 NOTE — ED Notes (Signed)
Notified pharmacy again for ASA

## 2014-07-03 NOTE — ED Notes (Signed)
Notified RN of CBG 98

## 2014-07-03 NOTE — ED Notes (Signed)
Ordered meal through environmental and they will send to 3W10.

## 2014-07-03 NOTE — ED Notes (Signed)
Notified Admitting MD of elevated troponin of 1.25

## 2014-07-03 NOTE — Progress Notes (Addendum)
Patient Name: Donald Gordon Date of Encounter: 07/03/2014     Principal Problem:   Atrial fibrillation with RVR Active Problems:   Leukocytosis   Paroxysmal atrial fibrillation - with RVR   Elevated troponin   Renal insufficiency   Essential hypertension   Hypotension   CAD (coronary artery disease)   Cough   Hyperlipemia   SIRS (systemic inflammatory response syndrome)   Diabetes mellitus type 2, controlled    SUBJECTIVE  Feeling quite well. No CP or SOB. No swelling, orthopnea or palpitations. No fevers or chills.   CURRENT MEDS . aspirin EC  81 mg Oral Daily  . clopidogrel  75 mg Oral Daily  . ezetimibe  5 mg Oral Daily  . gabapentin  300 mg Oral Daily  . insulin aspart  0-9 Units Subcutaneous TID WC  . metoprolol  50 mg Oral BID  . omega-3 acid ethyl esters  1 g Oral Daily  . pantoprazole  40 mg Oral Daily  . sodium chloride  3 mL Intravenous Q12H  . Warfarin - Pharmacist Dosing Inpatient   Does not apply q1800    OBJECTIVE  Filed Vitals:   07/03/14 1015 07/03/14 1100 07/03/14 1145 07/03/14 1319  BP: 128/68 123/65 112/56 155/60  Pulse: 71 64 65 66  Temp:    97.7 F (36.5 C)  TempSrc:    Oral  Resp: 15 20 21 19   Height:    5\' 9"  (1.753 m)  Weight:    166 lb 7.2 oz (75.5 kg)  SpO2: 95% 94% 95% 96%    Intake/Output Summary (Last 24 hours) at 07/03/14 1351 Last data filed at 07/03/14 0808  Gross per 24 hour  Intake      0 ml  Output    475 ml  Net   -475 ml   Filed Weights   07/02/14 1133 07/03/14 1319  Weight: 166 lb (75.297 kg) 166 lb 7.2 oz (75.5 kg)    PHYSICAL EXAM  General: Pleasant, NAD. Neuro: Alert and oriented X 3. Moves all extremities spontaneously. Psych: Normal affect. HEENT:  Normal  Neck: Supple without bruits or JVD. Lungs:  Resp regular and unlabored, CTA. Heart: RRR no s3, s4, or murmurs. Abdomen: Soft, non-tender, non-distended, BS + x 4.  Extremities: No clubbing, cyanosis or edema. DP/PT/Radials 2+ and equal  bilaterally.  Accessory Clinical Findings  CBC  Recent Labs  07/02/14 1150 07/03/14 0138  WBC 27.3* 13.8*  NEUTROABS  --  9.4*  HGB 14.4 12.1*  HCT 43.8 37.1*  MCV 92.2 93.0  PLT 286 700   Basic Metabolic Panel  Recent Labs  07/02/14 1150 07/03/14 0138  NA 140 137  K 4.7 3.7  CL 105 104  CO2 19 25  GLUCOSE 131* 120*  BUN 21 26*  CREATININE 1.37* 0.90  CALCIUM 9.3 8.4   Liver Function Tests  Recent Labs  07/03/14 0138  AST 23  ALT 16  ALKPHOS 55  BILITOT 0.3  PROT 5.5*  ALBUMIN 3.1*   No results for input(s): LIPASE, AMYLASE in the last 72 hours. Cardiac Enzymes  Recent Labs  07/02/14 2220 07/03/14 0203 07/03/14 1008  TROPONINI 1.25* 1.38* 0.83*    Fasting Lipid Panel  Recent Labs  07/03/14 0138  CHOL 134  HDL 40  LDLCALC 70  TRIG 118  CHOLHDL 3.4   Thyroid Function Tests  Recent Labs  07/02/14 2220  TSH 2.486    TELE  NSR  Radiology/Studies  Dg Chest [facility]  07/02/2014   CLINICAL DATA:  Chest pain, tachycardia  EXAM: PORTABLE CHEST - 1 VIEW  COMPARISON:  None.  FINDINGS: Chronic interstitial markings. No focal consolidation. No pleural effusion or pneumothorax.  The heart is top-normal in size.  IMPRESSION: No evidence of acute cardiopulmonary disease.   Electronically Signed   By: Julian Hy M.D.   On: 07/02/2014 13:39    ASSESSMENT AND PLAN  Mr. Fredell is a 74 y/o M with history of CAD (s/p balloon angioplasty in 1996), HTN, DM, HLD, PAF, PVD (s/p stenting to LE 08/2012, 02/2013, with LE thrombosis listed on patient's medical necklace), and 30 years of former tobacco abuse who presented to [facility] with AF RVR and marked leukocytosis.  Recurrent AF with RVR- converted to NSR spontaneously in the ED -- Patient is on metoprolol 50mg  BID which will be continued.  --  INR 3. Coumadin currently not re-ordered in case he needs an ischemic eval. Heparin gtt per pharmacy.  -- TSH normal   CAD s/p remote PTCA  1996/ Mild troponin elevation- 1.25--> 1.38--> 0.83.  -- May need nuclear stress test while inpatient.   Marked leukocytosis of unclear cause- WBC 27K. Now trending downwards 13.8 today.   Recent cough >2 months  PAD s/p multiple interventions  HTN- continue metoprolol 50mg  BID. Consider adding back home losartan as his creat has normalized and BP mildly elevated.   Hyperlipidemia, reportedly intolerant of statins. Continue Zetia and lovaza.  Renal insufficiency - improving. 1.37--> 0.90   Signed, Eileen Stanford PA-C  Pager [phone]

## 2014-07-03 NOTE — ED Notes (Signed)
Attempted report to floor.  

## 2014-07-03 NOTE — Evaluation (Signed)
Physical Therapy Evaluation Patient Details Name: Donald Gordon MRN: [id number] DOB: 07-09-1940 Today's Date: 07/03/2014   History of Present Illness    74 year old male with a history of COPD, proximal margin fibrillation, peripheral vascular disease, coronary artery disease with history of angioplasty, hypertension, diabetes mellitus type 2 presented with one-day history of palpitations and chest discomfort. He was found to have atrial fibrillation with RVR by EMS.    Clinical Impression  Pt reported to be functioning at baseline status. Pt able to safely perform DGI with a score of 22/24 indicating that he is at a low risk of falls. Pt able to safely negotiate 10 stairs without rails and with alternating step pattern. Pt demonstrated to PT the exercises for his legs that he does at home. Pt refused to perform other exercises with PT but PT stressed the importance of pt keeping up with his set of exercises. Pt does not require further acute PT at this time and does not require PT at discharge.     Follow Up Recommendations No PT follow up    Equipment Recommendations  None recommended by PT    Recommendations for Other Services       Precautions / Restrictions Precautions Precautions: Fall Restrictions Weight Bearing Restrictions: No      Mobility  Bed Mobility Overal bed mobility: Independent             General bed mobility comments: Pt able to safely perform bed mobility without physical assistance or cuing. Pt able to perform at a normal pace  Transfers Overall transfer level: Independent Equipment used: None             General transfer comment: Pt able to safely stand from lowered bed without assistance, cuing or UE assistance. Pt without loss of balance in standing.   Ambulation/Gait Ambulation/Gait assistance: Supervision Ambulation Distance (Feet): 200 Feet Assistive device: None Gait Pattern/deviations: Step-through pattern;Decreased stride  length;Decreased step length - right;Decreased step length - left   Gait velocity interpretation: at or above normal speed for age/gender General Gait Details: Pt able to safely ambulate to stairs and back without assistive device. Pt had one instance of loss of balance which he was able to self correct. Able to accept challenges during ambulation without further loss of balance.   Stairs Stairs: Yes Stairs assistance: Supervision Stair Management: No rails;Alternating pattern;Forwards Number of Stairs: 10 General stair comments: Pt able to safely negotiate 10 stairs without loss of balance, assistance or cuing. PT supervision for safety.   Wheelchair Mobility    Modified Rankin (Stroke Patients Only)       Balance                                 Standardized Balance Assessment Standardized Balance Assessment : Dynamic Gait Index   Dynamic Gait Index Level Surface: Normal Change in Gait Speed: Normal Gait with Horizontal Head Turns: Normal Gait with Vertical Head Turns: Normal Gait and Pivot Turn: Mild Impairment Step Over Obstacle: Mild Impairment Step Around Obstacles: Normal Steps: Normal Total Score: 22       Pertinent Vitals/Pain Pain Assessment: No/denies pain    Home Living Family/patient expects to be discharged to:: Private residence Living Arrangements: Non-relatives/Friends Available Help at Discharge: Friend(s);Available 24 hours/day Type of Home: House Home Access: Stairs to enter Entrance Stairs-Rails: None Entrance Stairs-Number of Steps: 3 Home Layout: Two level;Able to live on main level with bedroom/bathroom  Home Equipment: [place] - 4 wheels Additional Comments: Pt stated that he lives with his "friend" Clinical cytogeneticist. At discharge he stated he will be going to Peggy's house so all information is for her residence. Pt stated that he lives in a mobile home.     Prior Function Level of Independence: Independent         Comments: Pt  continues to drive     Hand Dominance   Dominant Hand: Right    Extremity/Trunk Assessment               Lower Extremity Assessment: Overall WFL for tasks assessed         Communication   Communication: No difficulties  Cognition Arousal/Alertness: Awake/alert Behavior During Therapy: WFL for tasks assessed/performed Overall Cognitive Status: Within Functional Limits for tasks assessed                      General Comments      Exercises        Assessment/Plan    PT Assessment Patent does not need any further PT services  PT Diagnosis Difficulty walking   PT Problem List    PT Treatment Interventions     PT Goals (Current goals can be found in the Care Plan section)      Frequency     Barriers to discharge        Co-evaluation               End of Session Equipment Utilized During Treatment: Gait belt Activity Tolerance: Patient tolerated treatment well Patient left: in chair;with call bell/phone within reach Nurse Communication: Mobility status         Time: 1431-1445 PT Time Calculation (min) (ACUTE ONLY): 14 min   Charges:   PT Evaluation $Initial PT Evaluation Tier I: 1 Procedure PT Treatments $Gait Training: 8-22 mins   PT G CodesIrwin Gordon F SPT 07/03/2014, 3:11 PM  Donald Gordon, SPT  Acute Rehabilitation [phone] [facility] [place] Acute Rehabilitation [phone] (pager)

## 2014-07-03 NOTE — Progress Notes (Signed)
PROGRESS NOTE  Donald Gordon [id number] DOB: 19-Jul-1940 DOA: 07/02/2014 PCP: No PCP Per Patient  Brief history 74 year old male with a history of COPD, proximal margin fibrillation, peripheral vascular disease, coronary artery disease with history of angioplasty, hypertension, diabetes mellitus type 2 presented with one-day history of palpitations and chest discomfort.  The patient woke up approximately 1 AM on 07/02/2014 with chills and shivering and bilateral arthralgias and aching in his elbows. The patient took some subjective nitroglycerin and aspirin and called EMS. He was found to have atrial fibrillation with RVR by EMS. In the emergency department, the patient was given a bolus of diltiazem IV and he spontaneously converted to sinus rhythm. After conversion, the patient stated that his chest discomfort improved. The patient has a 90-pack-year history of tobacco, but quit in 1996. He denies any alcohol or illegal drug use. The patient has been in his usual state of health prior to the admission. Patient denies fevers, chills, headache, dyspnea, nausea, vomiting, diarrhea, abdominal pain, dysuria, hematuria, hematochezia, melena, synovitis. He has nonproductive cough, but states that this is not new for him. In the emergency department, the patient was found to have troponin elevation up to 1.38. In addition, he was also noted to have leukocytosis of 27.3. However the patient was afebrile and hemodynamically stable albeit his blood pressure was soft.  Assessment/Plan: Atrial fibrillation with RVR -Spontaneously converted to sinus rhythm after one intravenous bolus of diltiazem -Continue metoprolol tartrate -Echocardiogram -Appreciate cardiology evaluation and recommendations -Patient is already on warfarin, INR therapeutic -TSH--2.486 Elevated troponin with history of CAD -Appreciate cardiology evaluation -Plans noted for Myoview -repeat EKG with nonspecific ST-T  changes Leukocytosis -The patient is nontoxic,and afebrile, and hemodynamically stable -Urinalysis is negative for pyuria -will not start antibiotics at this time -Follow blood culture data -Chest x-ray without any consolidation -influenza PCR--neg -Differential on CBC is unremarkable -WBC improved to 13.8-->?reactive due to stress demargination AKI -Serum creatinine 1.37 on the day of admission -Improved to 0.9 -Hold metformin Hypertension -hold losartan and chlorthalidone due to soft BP and renal insufficiency Diabetes mellitus type 2 -NovoLog sliding scale -Hemoglobin A1c Peripheral vascular disease -The patient is on aspirin and Plavix--apparently due to hx of peripheral stents, bypass   Family Communication:   Pt at beside Disposition Plan:   Home when medically stable      Procedures/Studies: Dg Chest Port 1 View  07/02/2014   CLINICAL DATA:  Chest pain, tachycardia  EXAM: PORTABLE CHEST - 1 VIEW  COMPARISON:  None.  FINDINGS: Chronic interstitial markings. No focal consolidation. No pleural effusion or pneumothorax.  The heart is top-normal in size.  IMPRESSION: No evidence of acute cardiopulmonary disease.   Electronically Signed   By: Julian Hy M.D.   On: 07/02/2014 13:39         Subjective: Patient denies fevers, chills, headache, chest pain, dyspnea, nausea, vomiting, diarrhea, abdominal pain, dysuria, hematuria   Objective: Filed Vitals:   07/03/14 0930 07/03/14 1015 07/03/14 1100 07/03/14 1145  BP: 120/62 128/68 123/65 112/56  Pulse: 73 71 64 65  Temp:      TempSrc:      Resp: 16 15 20 21   Height:      Weight:      SpO2: 93% 95% 94% 95%    Intake/Output Summary (Last 24 hours) at 07/03/14 1242 Last data filed at 07/03/14 0808  Gross per 24 hour  Intake      0  ml  Output    475 ml  Net   -475 ml   Weight change:  Exam:   General:  Pt is alert, follows commands appropriately, not in acute distress  HEENT: No icterus, No thrush,  [place]/AT  Cardiovascular: RRR, S1/S2, no rubs, no gallops  Respiratory: Bibasilar crackles. No wheezing. Good air movement   Abdomen: Soft/+BS, non tender, non distended, no guarding  Extremities: No edema, No lymphangitis, No petechiae, No rashes, no synovitis  Data Reviewed: Basic Metabolic Panel:  Recent Labs Lab 07/02/14 1150 07/03/14 0138  NA 140 137  K 4.7 3.7  CL 105 104  CO2 19 25  GLUCOSE 131* 120*  BUN 21 26*  CREATININE 1.37* 0.90  CALCIUM 9.3 8.4   Liver Function Tests:  Recent Labs Lab 07/03/14 0138  AST 23  ALT 16  ALKPHOS 55  BILITOT 0.3  PROT 5.5*  ALBUMIN 3.1*   No results for input(s): LIPASE, AMYLASE in the last 168 hours. No results for input(s): AMMONIA in the last 168 hours. CBC:  Recent Labs Lab 07/02/14 1150 07/03/14 0138  WBC 27.3* 13.8*  NEUTROABS  --  9.4*  HGB 14.4 12.1*  HCT 43.8 37.1*  MCV 92.2 93.0  PLT 286 225   Cardiac Enzymes:  Recent Labs Lab 07/02/14 2220 07/03/14 0203 07/03/14 1008  TROPONINI 1.25* 1.38* 0.83*   BNP: Invalid input(s): POCBNP CBG:  Recent Labs Lab 07/03/14 0807 07/03/14 1152  GLUCAP 98 93    No results found for this or any previous visit (from the past 240 hour(s)).   Scheduled Meds: . aspirin EC  81 mg Oral Daily  . clopidogrel  75 mg Oral Daily  . ezetimibe  5 mg Oral Daily  . gabapentin  300 mg Oral Daily  . insulin aspart  0-9 Units Subcutaneous TID WC  . metoprolol  50 mg Oral BID  . omega-3 acid ethyl esters  1 g Oral Daily  . pantoprazole  40 mg Oral Daily  . sodium chloride  3 mL Intravenous Q12H  . Warfarin - Pharmacist Dosing Inpatient   Does not apply q1800   Continuous Infusions:    Cadey Bazile, DO  [facility] Pager [phone]  If 7PM-7AM, please contact night-coverage [email or web address] Password [email or web address] 07/03/2014, 12:42 PM   LOS: 1 day

## 2014-07-03 NOTE — Progress Notes (Signed)
ANTICOAGULATION CONSULT NOTE - Follow Up Consult  Pharmacy Consult for Heparin Indication: atrial fibrillation  Allergies  Allergen Reactions  . Statins Other (See Comments)    Muscle and Bone pain    Patient Measurements: Height: 5\' 9"  (175.3 cm) Weight: 166 lb 7.2 oz (75.5 kg) IBW/kg (Calculated) : 70.7 Heparin Dosing Weight:   Vital Signs: Temp: 97.7 F (36.5 C) (01/11 1319) Temp Source: Oral (01/11 1319) BP: 155/60 mmHg (01/11 1319) Pulse Rate: 66 (01/11 1319)  Labs:  Recent Labs  07/02/14 1150 07/02/14 2220 07/03/14 0138 07/03/14 0203 07/03/14 1008  HGB 14.4  --  12.1*  --   --   HCT 43.8  --  37.1*  --   --   PLT 286  --  225  --   --   APTT 44*  --   --   --   --   LABPROT 29.9*  --  31.4*  --   --   INR 2.82*  --  3.00*  --   --   CREATININE 1.37*  --  0.90  --   --   TROPONINI  --  1.25*  --  1.38* 0.83*    Estimated Creatinine Clearance: 72 mL/min (by C-G formula based on Cr of 0.9).   Medications:  Scheduled:  . aspirin EC  81 mg Oral Daily  . clopidogrel  75 mg Oral Daily  . ezetimibe  5 mg Oral Daily  . gabapentin  300 mg Oral Daily  . insulin aspart  0-9 Units Subcutaneous TID WC  . metoprolol  50 mg Oral BID  . omega-3 acid ethyl esters  1 g Oral Daily  . pantoprazole  40 mg Oral Daily  . sodium chloride  3 mL Intravenous Q12H  . Warfarin - Pharmacist Dosing Inpatient   Does not apply q1800    Assessment: 74yo male with AFib, Coumadin currently on hold with order for Heparin to start when INR < 2.  INR = 3 this AM, Hg 12.1 and pltc wnl.  No bleeding noted.  Have discussed KThompson PA as to whether pt should now be on Coumadin due to questions raised by her Progress note today.  She states to continue current plan and MD will evaluate if no further work-up necessary, resuming Coumadin at that time.  Goal of Therapy:  Heparin level 0.3-0.7 units/ml Monitor platelets by anticoagulation protocol: Yes   Plan:  -Heparin when INR <  2 -Daily INR -Monitor for s/s of bleeding  Gracy Bruins, [facility]

## 2014-07-03 NOTE — ED Notes (Signed)
Called pharmacy to ask for remaining meds for patient.

## 2014-07-03 NOTE — ED Notes (Signed)
Notified RN CBG [id number]

## 2014-07-03 NOTE — Progress Notes (Signed)
ANTICOAGULATION CONSULT NOTE - Initial Consult  Pharmacy Consult for Heparin (warfarin on hold) Indication: chest pain/ACS, afib  Patient Measurements: Height: 5\' 9"  (175.3 cm) Weight: 166 lb (75.297 kg) IBW/kg (Calculated) : 70.7   Labs:  Recent Labs  07/02/14 1150 07/02/14 2220 07/03/14 0138 07/03/14 0203  HGB 14.4  --  12.1*  --   HCT 43.8  --  37.1*  --   PLT 286  --  225  --   APTT 44*  --   --   --   LABPROT 29.9*  --  31.4*  --   INR 2.82*  --  3.00*  --   CREATININE 1.37*  --  0.90  --   TROPONINI  --  1.25*  --  1.38*    Assessment: INR 3, Hgb 12.1  Goal of Therapy:  Heparin level 0.3-0.7 units/ml Monitor platelets by anticoagulation protocol: Yes   Plan:  -Hold warfarin during CP work-up -Start heparin when INR is <2  Narda Bonds 07/03/2014,6:19 AM

## 2014-07-03 NOTE — ED Notes (Addendum)
Message to Dr. Carles Collet to see if patient can get a diet.

## 2014-07-04 ENCOUNTER — Inpatient Hospital Stay (HOSPITAL_COMMUNITY): Payer: Medicare Other

## 2014-07-04 DIAGNOSIS — I517 Cardiomegaly: Secondary | ICD-10-CM | POA: Diagnosis not present

## 2014-07-04 DIAGNOSIS — I4891 Unspecified atrial fibrillation: Secondary | ICD-10-CM | POA: Diagnosis present

## 2014-07-04 DIAGNOSIS — N289 Disorder of kidney and ureter, unspecified: Secondary | ICD-10-CM | POA: Diagnosis not present

## 2014-07-04 DIAGNOSIS — I2510 Atherosclerotic heart disease of native coronary artery without angina pectoris: Secondary | ICD-10-CM | POA: Diagnosis not present

## 2014-07-04 DIAGNOSIS — D72829 Elevated white blood cell count, unspecified: Secondary | ICD-10-CM

## 2014-07-04 DIAGNOSIS — I10 Essential (primary) hypertension: Secondary | ICD-10-CM | POA: Diagnosis not present

## 2014-07-04 DIAGNOSIS — E119 Type 2 diabetes mellitus without complications: Secondary | ICD-10-CM | POA: Diagnosis not present

## 2014-07-04 DIAGNOSIS — R7989 Other specified abnormal findings of blood chemistry: Secondary | ICD-10-CM | POA: Diagnosis not present

## 2014-07-04 DIAGNOSIS — R079 Chest pain, unspecified: Secondary | ICD-10-CM | POA: Diagnosis not present

## 2014-07-04 LAB — CBC WITH DIFFERENTIAL/PLATELET
Basophils Absolute: 0 10*3/uL (ref 0.0–0.1)
Basophils Relative: 1 % (ref 0–1)
EOS ABS: 0.2 10*3/uL (ref 0.0–0.7)
EOS PCT: 3 % (ref 0–5)
HEMATOCRIT: 38.3 % — AB (ref 39.0–52.0)
HEMOGLOBIN: 12.6 g/dL — AB (ref 13.0–17.0)
LYMPHS ABS: 1.8 10*3/uL (ref 0.7–4.0)
Lymphocytes Relative: 24 % (ref 12–46)
MCH: 31.3 pg (ref 26.0–34.0)
MCHC: 32.9 g/dL (ref 30.0–36.0)
MCV: 95.3 fL (ref 78.0–100.0)
MONO ABS: 1.1 10*3/uL — AB (ref 0.1–1.0)
MONOS PCT: 15 % — AB (ref 3–12)
Neutro Abs: 4.6 10*3/uL (ref 1.7–7.7)
Neutrophils Relative %: 59 % (ref 43–77)
Platelets: 250 10*3/uL (ref 150–400)
RBC: 4.02 MIL/uL — AB (ref 4.22–5.81)
RDW: 13.5 % (ref 11.5–15.5)
WBC: 7.8 10*3/uL (ref 4.0–10.5)

## 2014-07-04 LAB — PROTIME-INR
INR: 2.37 — ABNORMAL HIGH (ref 0.00–1.49)
Prothrombin Time: 26.1 seconds — ABNORMAL HIGH (ref 11.6–15.2)

## 2014-07-04 LAB — BASIC METABOLIC PANEL
Anion gap: 11 (ref 5–15)
BUN: 18 mg/dL (ref 6–23)
CHLORIDE: 107 meq/L (ref 96–112)
CO2: 22 mmol/L (ref 19–32)
Calcium: 8.7 mg/dL (ref 8.4–10.5)
Creatinine, Ser: 0.84 mg/dL (ref 0.50–1.35)
GFR calc Af Amer: >90 mL/min (ref >90)
GFR calc non Af Amer: 84 mL/min — ABNORMAL LOW (ref >90)
GLUCOSE: 102 mg/dL — AB (ref 70–99)
Potassium: 4.7 mmol/L (ref 3.5–5.1)
Sodium: 140 mmol/L (ref 135–145)

## 2014-07-04 LAB — GLUCOSE, CAPILLARY
Glucose-Capillary: 131 mg/dL — ABNORMAL HIGH (ref 70–99)
Glucose-Capillary: 200 mg/dL — ABNORMAL HIGH (ref 70–99)

## 2014-07-04 LAB — HEMOGLOBIN A1C
Hgb A1c MFr Bld: 6.1 % — ABNORMAL HIGH (ref <5.7)
Mean Plasma Glucose: 128 mg/dL — ABNORMAL HIGH (ref <117)

## 2014-07-04 LAB — T3, FREE: T3, Free: 1.9 pg/mL — ABNORMAL LOW (ref 2.0–4.4)

## 2014-07-04 MED ORDER — TECHNETIUM TC 99M SESTAMIBI GENERIC - CARDIOLITE
10.0000 | Freq: Once | INTRAVENOUS | Status: AC | PRN
  Administered 2014-07-04: 10 via INTRAVENOUS

## 2014-07-04 MED ORDER — ACETAMINOPHEN 325 MG PO TABS
ORAL_TABLET | ORAL | Status: AC
  Administered 2014-07-04: 325 mg
  Filled 2014-07-04: qty 2

## 2014-07-04 MED ORDER — REGADENOSON 0.4 MG/5ML IV SOLN
0.4000 mg | Freq: Once | INTRAVENOUS | Status: AC
  Administered 2014-07-04: 0.4 mg via INTRAVENOUS

## 2014-07-04 MED ORDER — TECHNETIUM TC 99M SESTAMIBI GENERIC - CARDIOLITE
30.0000 | Freq: Once | INTRAVENOUS | Status: AC | PRN
  Administered 2014-07-04: 30 via INTRAVENOUS

## 2014-07-04 MED ORDER — LOSARTAN POTASSIUM 50 MG PO TABS
50.0000 mg | ORAL_TABLET | Freq: Two times a day (BID) | ORAL | Status: DC
  Administered 2014-07-04: 50 mg via ORAL
  Filled 2014-07-04: qty 1

## 2014-07-04 MED ORDER — REGADENOSON 0.4 MG/5ML IV SOLN
INTRAVENOUS | Status: AC
  Filled 2014-07-04: qty 5

## 2014-07-04 NOTE — Progress Notes (Signed)
Echocardiogram 2D Echocardiogram has been performed.  Donald Gordon 07/04/2014, 11:19 AM

## 2014-07-04 NOTE — Discharge Summary (Signed)
Physician Discharge Summary  UTAH DELAUDER XBM:[id number] DOB: 1940/10/06 DOA: 07/02/2014  PCP: No PCP Per Patient  Admit date: 07/02/2014 Discharge date: 07/04/2014  Recommendations for Outpatient Follow-up:  1. Pt will need to follow up with PCP in 2 weeks post discharge 2. Please obtain BMP and CBC in one week 3. Please check INR on 07/06/2014 and adjust warfarin accordingly  Discharge Diagnoses:  Atrial fibrillation with RVR -Spontaneously converted to sinus rhythm after one intravenous bolus of diltiazem -Continue metoprolol tartrate -Echocardiogram--EF 65-70 percent, grade 1 diastolic dysfunction, no wall motion abnormality -Appreciate cardiology evaluation and recommendations -Patient is already on warfarin, INR therapeutic -TSH--2.486 -The patient was instructed to continue Plavix, d/c ASA Elevated troponin with history of CAD -Appreciate cardiology evaluation -Plans noted for Myoview -07/04/2014 blood medicine stress test negative for reversible ischemia, EF 71% -repeat EKG with nonspecific ST-T changes Leukocytosis -The patient is nontoxic,and afebrile, and hemodynamically stable -Urinalysis is negative for pyuria -will not start antibiotics at this time -Follow blood culture data--negative to date -Chest x-ray without any consolidation -influenza PCR--neg -Differential on CBC is unremarkable -WBC improved to 27.3-->13.8-->7.8-->?reactive due to stress demargination AKI -Serum creatinine 1.37 on the day of admission -Hold metformin during the hospitalization, but can be restarted after discharge -Improved to 0.8 for the day of discharge Hypertension -hold losartan and chlorthalidone due to soft BP and renal insufficiency Diabetes mellitus type 2 -NovoLog sliding scale -Hemoglobin A1c--6.1 Peripheral vascular disease -The patient is on aspirin and Plavix--apparently due to hx of peripheral stents, bypass Discharge Condition: Stable  Disposition:   home  Diet:heart healthy Wt Readings from Last 3 Encounters:  07/04/14 75 kg (165 lb 5.5 oz)    History of present illness:  74 year old male with a history of COPD, proximal margin fibrillation, peripheral vascular disease, coronary artery disease with history of angioplasty, hypertension, diabetes mellitus type 2 presented with one-day history of palpitations and chest discomfort. The patient woke up approximately 1 AM on 07/02/2014 with chills and shivering and bilateral arthralgias and aching in his elbows. The patient took some subjective nitroglycerin and aspirin and called EMS. He was found to have atrial fibrillation with RVR by EMS. In the emergency department, the patient was given a bolus of diltiazem IV and he spontaneously converted to sinus rhythm. After conversion, the patient stated that his chest discomfort improved. The patient has a 90-pack-year history of tobacco, but quit in 1996. He denies any alcohol or illegal drug use. The patient has been in his usual state of health prior to the admission. Patient denies fevers, chills, headache, dyspnea, nausea, vomiting, diarrhea, abdominal pain, dysuria, hematuria, hematochezia, melena, synovitis. He has nonproductive cough, but states that this is not new for him. In the emergency department, the patient was found to have troponin elevation up to 1.38. In addition, he was also noted to have leukocytosis of 27.3. However the patient was afebrile and hemodynamically stable albeit his blood pressure was soft. The patient's blood pressure improved throughout the hospitalization and actually became hypertensive once again. The patient's home medications including chlorthalidone and lipase are normal were restarted. Nuclear medicine stress test was negative for any reversible ischemia with ejection fraction 71%. Echocardiogram showed EF 65-70 percent, grade 1 diastolic dysfunction, no wall motion abnormality.  Consultants: cardiology  Discharge  Exam: Filed Vitals:   07/04/14 0943  BP: 152/71  Pulse:   Temp:   Resp:    Filed Vitals:   07/04/14 0937 07/04/14 0939 07/04/14 0941 07/04/14 0943  BP: 168/75  156/75 154/75 152/71  Pulse:      Temp:      TempSrc:      Resp:      Height:      Weight:      SpO2:       General: A&O x 3, NAD, pleasant, cooperative Cardiovascular: RRR, no rub, no gallop, no S3 Respiratory: CTAB, no wheeze, no rhonchi Abdomen:soft, nontender, nondistended, positive bowel sounds Extremities: No edema, No lymphangitis, no petechiae  Discharge Instructions      Discharge Instructions    Diet - low sodium heart healthy    Complete by:  As directed      Increase activity slowly    Complete by:  As directed             Medication List    STOP taking these medications        aspirin 325 MG tablet      TAKE these medications        chlorthalidone 25 MG tablet  Commonly known as:  HYGROTON  Take 6 mg by mouth daily. Pt breaks med up into 1/4 tabs     clopidogrel 75 MG tablet  Commonly known as:  PLAVIX  Take 75 mg by mouth daily.     ezetimibe 10 MG tablet  Commonly known as:  ZETIA  Take 5 mg by mouth daily.     FISH OIL PO  Take 1 capsule by mouth daily.     gabapentin 300 MG capsule  Commonly known as:  NEURONTIN  Take 300 mg by mouth daily.     losartan 50 MG tablet  Commonly known as:  COZAAR  Take 50 mg by mouth 2 (two) times daily.     metFORMIN 500 MG tablet  Commonly known as:  GLUCOPHAGE  Take 500 mg by mouth 2 (two) times daily with a meal.     metoprolol 50 MG tablet  Commonly known as:  LOPRESSOR  Take 50 mg by mouth 2 (two) times daily.     multivitamin with minerals tablet  Take 1 tablet by mouth daily.     nitroGLYCERIN 0.4 MG SL tablet  Commonly known as:  NITROSTAT  Place 0.4 mg under the tongue every 5 (five) minutes as needed for chest pain.     pantoprazole 40 MG tablet  Commonly known as:  PROTONIX  Take 40 mg by mouth daily.     traMADol  50 MG tablet  Commonly known as:  ULTRAM  Take 50 mg by mouth every 6 (six) hours as needed for moderate pain.     warfarin 5 MG tablet  Commonly known as:  COUMADIN  Take 5-7.5 mg by mouth daily. Takes 5 mg on Tuesday, all other days takes 7.5 mg         The results of significant diagnostics from this hospitalization (including imaging, microbiology, ancillary and laboratory) are listed below for reference.    Significant Diagnostic Studies: Nm Myocar Multi W/spect W/wall Motion / Ef  07/04/2014   CLINICAL DATA:  Chest pain, CAD  EXAM: MYOCARDIAL IMAGING WITH SPECT (REST AND PHARMACOLOGIC-STRESS)  GATED LEFT VENTRICULAR WALL MOTION STUDY  LEFT VENTRICULAR EJECTION FRACTION  TECHNIQUE: Standard myocardial SPECT imaging was performed after resting intravenous injection of 10 mCi Tc-29m sestamibi. Subsequently, intravenous infusion of Lexiscan was performed under the supervision of the Cardiology staff. At peak effect of the drug, 30 mCi Tc-28m sestamibi was injected intravenously and standard myocardial SPECT imaging was performed. Quantitative  gated imaging was also performed to evaluate left ventricular wall motion, and estimate left ventricular ejection fraction.  COMPARISON:  None.  FINDINGS: Perfusion: No decreased activity in the left ventricle on stress imaging to suggest reversible ischemia or infarction. Small fixed defect along the anterior apex.  Wall Motion: Normal left ventricular wall motion. No left ventricular dilation.  Left Ventricular Ejection Fraction: 71 %  End diastolic volume 64 ml  End systolic volume 18 ml  IMPRESSION: 1. No reversible ischemia or infarction. Small fixed defect along the anterior apex, possibly reflecting soft tissue attenuation versus prior infarct.  2. Normal left ventricular wall motion.  3. Left ventricular ejection fraction 71%  4. Low-risk stress test findings*.  *2012 Appropriate Use Criteria for Coronary Revascularization Focused Update: J Am Coll  Cardiol. 5465;03(5):465-681. http://content.airportbarriers.com.aspx?articleid=1201161   Electronically Signed   By: Julian Hy M.D.   On: 07/04/2014 14:14   Dg Chest Port 1 View  07/02/2014   CLINICAL DATA:  Chest pain, tachycardia  EXAM: PORTABLE CHEST - 1 VIEW  COMPARISON:  None.  FINDINGS: Chronic interstitial markings. No focal consolidation. No pleural effusion or pneumothorax.  The heart is top-normal in size.  IMPRESSION: No evidence of acute cardiopulmonary disease.   Electronically Signed   By: Julian Hy M.D.   On: 07/02/2014 13:39     Microbiology: Recent Results (from the past 240 hour(s))  Blood culture (routine x 2)     Status: None (Preliminary result)   Collection Time: 07/02/14  7:26 PM  Result Value Ref Range Status   Specimen Description BLOOD RIGHT HAND  Final   Special Requests BOTTLES DRAWN AEROBIC AND ANAEROBIC 5CC EA  Final   Culture   Final           BLOOD CULTURE RECEIVED NO GROWTH TO DATE CULTURE WILL BE HELD FOR 5 DAYS BEFORE ISSUING A FINAL NEGATIVE REPORT Performed at [facility]    Report Status PENDING  Incomplete  Blood culture (routine x 2)     Status: None (Preliminary result)   Collection Time: 07/02/14  7:27 PM  Result Value Ref Range Status   Specimen Description BLOOD LEFT ANTECUBITAL  Final   Special Requests BOTTLES DRAWN AEROBIC AND ANAEROBIC 5CC EA  Final   Culture   Final           BLOOD CULTURE RECEIVED NO GROWTH TO DATE CULTURE WILL BE HELD FOR 5 DAYS BEFORE ISSUING A FINAL NEGATIVE REPORT Performed at [facility]    Report Status PENDING  Incomplete     Labs: Basic Metabolic Panel:  Recent Labs Lab 07/02/14 1150 07/03/14 0138 07/04/14 0449  NA 140 137 140  K 4.7 3.7 4.7  CL 105 104 107  CO2 19 25 22   GLUCOSE 131* 120* 102*  BUN 21 26* 18  CREATININE 1.37* 0.90 0.84  CALCIUM 9.3 8.4 8.7   Liver Function Tests:  Recent Labs Lab 07/03/14 0138  AST 23  ALT 16  ALKPHOS 55  BILITOT 0.3   PROT 5.5*  ALBUMIN 3.1*   No results for input(s): LIPASE, AMYLASE in the last 168 hours. No results for input(s): AMMONIA in the last 168 hours. CBC:  Recent Labs Lab 07/02/14 1150 07/03/14 0138 07/04/14 0449  WBC 27.3* 13.8* 7.8  NEUTROABS  --  9.4* 4.6  HGB 14.4 12.1* 12.6*  HCT 43.8 37.1* 38.3*  MCV 92.2 93.0 95.3  PLT 286 225 250   Cardiac Enzymes:  Recent Labs Lab 07/02/14 2220  07/03/14 0203 07/03/14 1008  TROPONINI 1.25* 1.38* 0.83*   BNP: Invalid input(s): POCBNP CBG:  Recent Labs Lab 07/03/14 1152 07/03/14 1636 07/03/14 2049 07/04/14 1037 07/04/14 1147  GLUCAP 93 172* 102* 131* 200*    Time coordinating discharge:  Greater than 30 minutes  Signed:  Brock Larmon, DO [facility] Pager: [phone] 07/04/2014, 4:06 PM

## 2014-07-04 NOTE — Progress Notes (Signed)
Subjective: No chest pain, no SOB.    Objective: Vital signs in last 24 hours: Temp:  [97.7 F (36.5 C)-98.7 F (37.1 C)] 98.7 F (37.1 C) (01/12 0422) Pulse Rate:  [64-85] 77 (01/12 0422) Resp:  [15-21] 18 (01/12 0422) BP: (112-156)/(54-79) 156/79 mmHg (01/12 0846) SpO2:  [93 %-96 %] 94 % (01/12 0422) Weight:  [165 lb 5.5 oz (75 kg)-166 lb 7.2 oz (75.5 kg)] 165 lb 5.5 oz (75 kg) (01/12 0422) Weight change: 7.2 oz (0.203 kg) Last BM Date: 07/03/14 Intake/Output from previous day:-235 01/11 0701 - 01/12 0700 In: 240 [P.O.:240] Out: 475 [Urine:475] Intake/Output this shift:    PE: General:Pleasant affect, NAD Skin:Warm and dry, brisk capillary refill HEENT:normocephalic, sclera clear, mucus membranes moist Heart:S1S2 RRR without murmur, gallup, rub or click Lungs:clear without rales, rhonchi, or wheezes KZS:WFUX, non tender, + BS, do not palpate liver spleen or masses Ext:no lower ext edema, 2+ pedal pulses, 2+ radial pulses Neuro:alert and oriented, MAE, follows commands, + facial symmetry TELE: SR Lab Results:  Recent Labs  07/03/14 0138 07/04/14 0449  WBC 13.8* 7.8  HGB 12.1* 12.6*  HCT 37.1* 38.3*  PLT 225 250   BMET  Recent Labs  07/03/14 0138 07/04/14 0449  NA 137 140  K 3.7 4.7  CL 104 107  CO2 25 22  GLUCOSE 120* 102*  BUN 26* 18  CREATININE 0.90 0.84  CALCIUM 8.4 8.7    Recent Labs  07/03/14 0203 07/03/14 1008  TROPONINI 1.38* 0.83*    Lab Results  Component Value Date   CHOL 134 07/03/2014   HDL 40 07/03/2014   LDLCALC 70 07/03/2014   TRIG 118 07/03/2014   CHOLHDL 3.4 07/03/2014   No results found for: HGBA1C   Lab Results  Component Value Date   TSH 2.486 07/02/2014    Hepatic Function Panel  Recent Labs  07/03/14 0138  PROT 5.5*  ALBUMIN 3.1*  AST 23  ALT 16  ALKPHOS 55  BILITOT 0.3    Recent Labs  07/03/14 0138  CHOL 134   No results for input(s): PROTIME in the last 72  hours.     Studies/Results: Dg Chest Port 1 View  07/02/2014   CLINICAL DATA:  Chest pain, tachycardia  EXAM: PORTABLE CHEST - 1 VIEW  COMPARISON:  None.  FINDINGS: Chronic interstitial markings. No focal consolidation. No pleural effusion or pneumothorax.  The heart is top-normal in size.  IMPRESSION: No evidence of acute cardiopulmonary disease.   Electronically Signed   By: Julian Hy M.D.   On: 07/02/2014 13:39    Medications: I have reviewed the patient's current medications. Scheduled Meds: . aspirin EC  81 mg Oral Daily  . clopidogrel  75 mg Oral Daily  . ezetimibe  5 mg Oral Daily  . gabapentin  300 mg Oral Daily  . insulin aspart  0-9 Units Subcutaneous TID WC  . metoprolol  50 mg Oral BID  . omega-3 acid ethyl esters  1 g Oral Daily  . pantoprazole  40 mg Oral Daily  . sodium chloride  3 mL Intravenous Q12H   Continuous Infusions:  PRN Meds:.acetaminophen, menthol-cetylpyridinium, nitroGLYCERIN, ondansetron (ZOFRAN) IV, traMADol  Assessment/Plan: Mr. Baade is a 74 y/o M with history of CAD (s/p balloon angioplasty in 1996), HTN, DM, HLD, PAF, PVD (s/p stenting to LE 08/2012, 02/2013, with LE thrombosis listed on patient's medical necklace), and 30 years of former tobacco abuse who presented to [facility] with AF RVR and marked leukocytosis.  Recurrent AF with RVR- converted to NSR spontaneously in the ED no further episodes -- Patient is on metoprolol 50mg  BID which will be continued.  -- INR 3. Coumadin currently not re-ordered in case he needs an ischemic eval. Heparin gtt per pharmacy once INR <2.0.  Today 2.37 will hold coumadin until results back.  -- TSH normal   CAD s/p remote PTCA 1996/ Mild troponin elevation- 1.25--> 1.38--> 0.83.  -- for nuclear stress testtoday.   Marked leukocytosis of unclear cause- WBC 27K. Now trending downwards 13.8 today.   Recent cough >2 months  PAD s/p multiple interventions  HTN- continue metoprolol 50mg  BID.  Consider adding back home losartan as his creat has normalized and BP mildly elevated.   Hyperlipidemia, reportedly intolerant of statins. Continue Zetia and lovaza.  Renal insufficiency - improving. 1.37--> 0.90-->0.84   LOS: 2 days   Time spent with pt. :15 minutes. [facility] R  Nurse Practitioner Certified Pager [phone] or after 5pm and on weekends call [phone] 07/04/2014, 9:32 AM

## 2014-07-04 NOTE — Progress Notes (Signed)
UR Completed Francina Beery Graves-Bigelow, RN,BSN [phone]  

## 2014-07-04 NOTE — Progress Notes (Signed)
ANTICOAGULATION CONSULT NOTE - Follow Up Consult  Pharmacy Consult for Heparin when INR < 2 Indication: atrial fibrillation  Allergies  Allergen Reactions  . Statins Other (See Comments)    Muscle and Bone pain    Patient Measurements: Height: 5\' 9"  (175.3 cm) Weight: 165 lb 5.5 oz (75 kg) IBW/kg (Calculated) : 70.7 Heparin Dosing Weight:   Vital Signs: Temp: 98.7 F (37.1 C) (01/12 0422) Temp Source: Oral (01/12 0422) BP: 152/71 mmHg (01/12 0943) Pulse Rate: 77 (01/12 0422)  Labs:  Recent Labs  07/02/14 1150 07/02/14 2220 07/03/14 0138 07/03/14 0203 07/03/14 1008 07/04/14 0449  HGB 14.4  --  12.1*  --   --  12.6*  HCT 43.8  --  37.1*  --   --  38.3*  PLT 286  --  225  --   --  250  APTT 44*  --   --   --   --   --   LABPROT 29.9*  --  31.4*  --   --  26.1*  INR 2.82*  --  3.00*  --   --  2.37*  CREATININE 1.37*  --  0.90  --   --  0.84  TROPONINI  --  1.25*  --  1.38* 0.83*  --     Estimated Creatinine Clearance: 77.2 mL/min (by C-G formula based on Cr of 0.84).   Medications:  Scheduled:  . clopidogrel  75 mg Oral Daily  . ezetimibe  5 mg Oral Daily  . gabapentin  300 mg Oral Daily  . insulin aspart  0-9 Units Subcutaneous TID WC  . losartan  50 mg Oral BID  . metoprolol  50 mg Oral BID  . omega-3 acid ethyl esters  1 g Oral Daily  . pantoprazole  40 mg Oral Daily  . sodium chloride  3 mL Intravenous Q12H    Assessment: 74yo male on Coumadin pta for AFib, currently awaiting INR to fall < 2 to initiate Heparin.  Stress test was negative and currently awaiting ECHO results.  There is the possibility Heparin will be d/c'd based on these results.  INR was 2.37 this AM and Hg 12.6/pltc wnl.  No bleeding noted.    Goal of Therapy:  Heparin level 0.3-0.7 units/ml Monitor platelets by anticoagulation protocol: Yes   Plan:  Repeat INR this PM F/U plan re: Heparin   Gracy Bruins, PharmD Clinical Pharmacist [facility]

## 2014-07-04 NOTE — Progress Notes (Signed)
lexiscan myoview completed without complications.  No chest pain,  nuc result to follow.

## 2014-07-09 LAB — CULTURE, BLOOD (ROUTINE X 2)
CULTURE: NO GROWTH
CULTURE: NO GROWTH

## 2014-09-06 NOTE — Progress Notes (Deleted)
Late entry for missed gcode 07/03/14:   Sep 14, 2014 1000  PT G-Codes **NOT FOR INPATIENT CLASS**  Functional Assessment Tool Used clinical judgment  Functional Limitation Mobility: Walking and moving around  Mobility: Walking and Moving Around Current Status (E6754) CI  Mobility: Walking and Moving Around Goal Status [phone]) CI  Mobility: Walking and Moving Around Discharge Status [phone]) CI  [facility] [phone] (pager)

## 2014-09-06 NOTE — Progress Notes (Signed)
Late entry for missed gcode 07/03/14:   09/14/14 1000  PT G-Codes **NOT FOR INPATIENT CLASS**  Functional Assessment Tool Used clinical judgment  Functional Limitation Mobility: Walking and moving around  Mobility: Walking and Moving Around Current Status (I7185) CI  Mobility: Walking and Moving Around Goal Status [phone]) CI  Mobility: Walking and Moving Around Discharge Status [phone]) CI  [facility] [phone] (pager)

## 2018-01-09 ENCOUNTER — Inpatient Hospital Stay (HOSPITAL_COMMUNITY)
Admission: EM | Admit: 2018-01-09 | Discharge: 2018-01-14 | DRG: 254 | Disposition: A | Discharge status: Home Health | Payer: Medicare Other | Attending: Vascular Surgery | Admitting: Vascular Surgery

## 2018-01-09 ENCOUNTER — Encounter (HOSPITAL_COMMUNITY)
Admission: EM | Disposition: A | Discharge status: Home Health | Payer: Self-pay | Source: Home / Self Care | Attending: Vascular Surgery

## 2018-01-09 ENCOUNTER — Emergency Department (HOSPITAL_COMMUNITY): Payer: Medicare Other | Admitting: Anesthesiology

## 2018-01-09 ENCOUNTER — Encounter (HOSPITAL_COMMUNITY): Payer: Self-pay

## 2018-01-09 ENCOUNTER — Encounter (HOSPITAL_COMMUNITY): Payer: Medicare Other

## 2018-01-09 ENCOUNTER — Other Ambulatory Visit: Payer: Self-pay

## 2018-01-09 ENCOUNTER — Emergency Department (HOSPITAL_COMMUNITY): Payer: Medicare Other

## 2018-01-09 DIAGNOSIS — I70209 Unspecified atherosclerosis of native arteries of extremities, unspecified extremity: Secondary | ICD-10-CM | POA: Diagnosis present

## 2018-01-09 DIAGNOSIS — I480 Paroxysmal atrial fibrillation: Secondary | ICD-10-CM | POA: Diagnosis present

## 2018-01-09 DIAGNOSIS — Z888 Allergy status to other drugs, medicaments and biological substances status: Secondary | ICD-10-CM | POA: Diagnosis not present

## 2018-01-09 DIAGNOSIS — E1151 Type 2 diabetes mellitus with diabetic peripheral angiopathy without gangrene: Secondary | ICD-10-CM | POA: Diagnosis present

## 2018-01-09 DIAGNOSIS — Z87891 Personal history of nicotine dependence: Secondary | ICD-10-CM | POA: Diagnosis not present

## 2018-01-09 DIAGNOSIS — M79604 Pain in right leg: Secondary | ICD-10-CM

## 2018-01-09 DIAGNOSIS — I252 Old myocardial infarction: Secondary | ICD-10-CM | POA: Diagnosis not present

## 2018-01-09 DIAGNOSIS — Z7901 Long term (current) use of anticoagulants: Secondary | ICD-10-CM | POA: Diagnosis not present

## 2018-01-09 DIAGNOSIS — I2510 Atherosclerotic heart disease of native coronary artery without angina pectoris: Secondary | ICD-10-CM | POA: Diagnosis present

## 2018-01-09 DIAGNOSIS — Z8249 Family history of ischemic heart disease and other diseases of the circulatory system: Secondary | ICD-10-CM | POA: Diagnosis not present

## 2018-01-09 DIAGNOSIS — Z7984 Long term (current) use of oral hypoglycemic drugs: Secondary | ICD-10-CM

## 2018-01-09 DIAGNOSIS — Z79899 Other long term (current) drug therapy: Secondary | ICD-10-CM

## 2018-01-09 DIAGNOSIS — K219 Gastro-esophageal reflux disease without esophagitis: Secondary | ICD-10-CM | POA: Diagnosis present

## 2018-01-09 DIAGNOSIS — I70229 Atherosclerosis of native arteries of extremities with rest pain, unspecified extremity: Secondary | ICD-10-CM

## 2018-01-09 DIAGNOSIS — Z955 Presence of coronary angioplasty implant and graft: Secondary | ICD-10-CM | POA: Diagnosis not present

## 2018-01-09 DIAGNOSIS — E1122 Type 2 diabetes mellitus with diabetic chronic kidney disease: Secondary | ICD-10-CM | POA: Diagnosis present

## 2018-01-09 DIAGNOSIS — I998 Other disorder of circulatory system: Secondary | ICD-10-CM | POA: Diagnosis present

## 2018-01-09 DIAGNOSIS — R55 Syncope and collapse: Secondary | ICD-10-CM | POA: Diagnosis not present

## 2018-01-09 DIAGNOSIS — Z7902 Long term (current) use of antithrombotics/antiplatelets: Secondary | ICD-10-CM

## 2018-01-09 DIAGNOSIS — I481 Persistent atrial fibrillation: Secondary | ICD-10-CM | POA: Diagnosis not present

## 2018-01-09 DIAGNOSIS — E785 Hyperlipidemia, unspecified: Secondary | ICD-10-CM | POA: Diagnosis present

## 2018-01-09 DIAGNOSIS — I10 Essential (primary) hypertension: Secondary | ICD-10-CM | POA: Diagnosis present

## 2018-01-09 DIAGNOSIS — I5030 Unspecified diastolic (congestive) heart failure: Secondary | ICD-10-CM | POA: Diagnosis not present

## 2018-01-09 HISTORY — PX: FEMORAL-POPLITEAL BYPASS GRAFT: SHX937

## 2018-01-09 HISTORY — PX: FASCIOTOMY: SHX132

## 2018-01-09 HISTORY — PX: PATCH ANGIOPLASTY: SHX6230

## 2018-01-09 HISTORY — PX: APPLICATION OF WOUND VAC: SHX5189

## 2018-01-09 HISTORY — PX: THROMBECTOMY FEMORAL ARTERY: SHX6406

## 2018-01-09 LAB — CBC WITH DIFFERENTIAL/PLATELET
Abs Immature Granulocytes: 0.1 10*3/uL (ref 0.0–0.1)
BASOS PCT: 0 %
Basophils Absolute: 0 10*3/uL (ref 0.0–0.1)
Eosinophils Absolute: 0 10*3/uL (ref 0.0–0.7)
Eosinophils Relative: 0 %
HCT: 41.5 % (ref 39.0–52.0)
Hemoglobin: 13.7 g/dL (ref 13.0–17.0)
Immature Granulocytes: 1 %
Lymphocytes Relative: 7 %
Lymphs Abs: 1.1 10*3/uL (ref 0.7–4.0)
MCH: 30.6 pg (ref 26.0–34.0)
MCHC: 33 g/dL (ref 30.0–36.0)
MCV: 92.6 fL (ref 78.0–100.0)
Monocytes Absolute: 1.1 10*3/uL — ABNORMAL HIGH (ref 0.1–1.0)
Monocytes Relative: 7 %
NEUTROS PCT: 85 %
Neutro Abs: 12.7 10*3/uL — ABNORMAL HIGH (ref 1.7–7.7)
PLATELETS: 261 10*3/uL (ref 150–400)
RBC: 4.48 MIL/uL (ref 4.22–5.81)
RDW: 13.2 % (ref 11.5–15.5)
WBC: 14.9 10*3/uL — ABNORMAL HIGH (ref 4.0–10.5)

## 2018-01-09 LAB — COMPREHENSIVE METABOLIC PANEL
ALT: 25 U/L (ref 0–44)
AST: 26 U/L (ref 15–41)
Albumin: 4.2 g/dL (ref 3.5–5.0)
Alkaline Phosphatase: 100 U/L (ref 38–126)
Anion gap: 14 (ref 5–15)
BUN: 21 mg/dL (ref 8–23)
CALCIUM: 9.6 mg/dL (ref 8.9–10.3)
CO2: 21 mmol/L — ABNORMAL LOW (ref 22–32)
Chloride: 107 mmol/L (ref 98–111)
Creatinine, Ser: 1.93 mg/dL — ABNORMAL HIGH (ref 0.61–1.24)
GFR calc Af Amer: 37 mL/min — ABNORMAL LOW (ref >60)
GFR, EST NON AFRICAN AMERICAN: 32 mL/min — AB (ref >60)
Glucose, Bld: 170 mg/dL — ABNORMAL HIGH (ref 70–99)
Potassium: 4.2 mmol/L (ref 3.5–5.1)
Sodium: 142 mmol/L (ref 135–145)
Total Bilirubin: 0.7 mg/dL (ref 0.3–1.2)
Total Protein: 7 g/dL (ref 6.5–8.1)

## 2018-01-09 LAB — GLUCOSE, CAPILLARY: Glucose-Capillary: 142 mg/dL — ABNORMAL HIGH (ref 70–99)

## 2018-01-09 LAB — TYPE AND SCREEN
ABO/RH(D): A POS
Antibody Screen: NEGATIVE

## 2018-01-09 LAB — ABO/RH: ABO/RH(D): A POS

## 2018-01-09 LAB — PROTIME-INR
INR: 1.28
Prothrombin Time: 15.9 seconds — ABNORMAL HIGH (ref 11.4–15.2)

## 2018-01-09 SURGERY — THROMBECTOMY, ARTERY, FEMORAL
Anesthesia: General | Site: Leg Lower | Laterality: Right

## 2018-01-09 MED ORDER — DOCUSATE SODIUM 100 MG PO CAPS
100.0000 mg | ORAL_CAPSULE | Freq: Every day | ORAL | Status: DC
  Administered 2018-01-10 – 2018-01-14 (×4): 100 mg via ORAL
  Filled 2018-01-09 (×4): qty 1

## 2018-01-09 MED ORDER — POLYETHYLENE GLYCOL 3350 17 G PO PACK
17.0000 g | PACK | Freq: Every day | ORAL | Status: DC | PRN
  Administered 2018-01-10: 17 g via ORAL
  Filled 2018-01-09: qty 1

## 2018-01-09 MED ORDER — PROTAMINE SULFATE 10 MG/ML IV SOLN
INTRAVENOUS | Status: DC | PRN
  Administered 2018-01-09: 50 mg via INTRAVENOUS

## 2018-01-09 MED ORDER — ONDANSETRON HCL 4 MG/2ML IJ SOLN
INTRAMUSCULAR | Status: AC
  Filled 2018-01-09: qty 2

## 2018-01-09 MED ORDER — FLEET ENEMA 7-19 GM/118ML RE ENEM: 1.0000 | ENEMA | Freq: Once | RECTAL | Status: DC | PRN

## 2018-01-09 MED ORDER — PROTAMINE SULFATE 10 MG/ML IV SOLN
INTRAVENOUS | Status: AC
  Filled 2018-01-09: qty 5

## 2018-01-09 MED ORDER — ONDANSETRON HCL 4 MG/2ML IJ SOLN
4.0000 mg | Freq: Four times a day (QID) | INTRAMUSCULAR | Status: DC | PRN
  Administered 2018-01-12 – 2018-01-13 (×2): 4 mg via INTRAVENOUS
  Filled 2018-01-09 (×2): qty 2

## 2018-01-09 MED ORDER — 0.9 % SODIUM CHLORIDE (POUR BTL) OPTIME
TOPICAL | Status: DC | PRN
  Administered 2018-01-09: 2000 mL

## 2018-01-09 MED ORDER — ACETAMINOPHEN 325 MG RE SUPP: 325.0000 mg | RECTAL | Status: DC | PRN

## 2018-01-09 MED ORDER — GLYCOPYRROLATE PF 0.2 MG/ML IJ SOSY
PREFILLED_SYRINGE | INTRAMUSCULAR | Status: AC
  Filled 2018-01-09: qty 1

## 2018-01-09 MED ORDER — PHENYLEPHRINE HCL 10 MG/ML IJ SOLN
INTRAMUSCULAR | Status: DC | PRN
  Administered 2018-01-09 (×2): 100 ug via INTRAVENOUS

## 2018-01-09 MED ORDER — OXYCODONE HCL 5 MG/5ML PO SOLN: 5.0000 mg | Freq: Once | ORAL | Status: DC | PRN

## 2018-01-09 MED ORDER — OMEGA-3-ACID ETHYL ESTERS 1 G PO CAPS
1.0000 g | ORAL_CAPSULE | Freq: Two times a day (BID) | ORAL | Status: DC
  Administered 2018-01-10 – 2018-01-14 (×7): 1 g via ORAL
  Filled 2018-01-09 (×8): qty 1

## 2018-01-09 MED ORDER — HYDROMORPHONE HCL 1 MG/ML IJ SOLN
1.0000 mg | INTRAMUSCULAR | Status: DC | PRN
  Administered 2018-01-09: 1 mg via INTRAVENOUS
  Filled 2018-01-09: qty 1

## 2018-01-09 MED ORDER — ROCURONIUM BROMIDE 100 MG/10ML IV SOLN
INTRAVENOUS | Status: DC | PRN
  Administered 2018-01-09: 60 mg via INTRAVENOUS

## 2018-01-09 MED ORDER — GUAIFENESIN-DM 100-10 MG/5ML PO SYRP: 15.0000 mL | ORAL_SOLUTION | ORAL | Status: DC | PRN

## 2018-01-09 MED ORDER — SODIUM CHLORIDE 0.9 % IV SOLN: 500.0000 mL | Freq: Once | INTRAVENOUS | Status: DC | PRN

## 2018-01-09 MED ORDER — FENTANYL CITRATE (PF) 250 MCG/5ML IJ SOLN
INTRAMUSCULAR | Status: DC | PRN
  Administered 2018-01-09 (×4): 25 ug via INTRAVENOUS
  Administered 2018-01-09: 50 ug via INTRAVENOUS

## 2018-01-09 MED ORDER — BISACODYL 10 MG RE SUPP: 10.0000 mg | Freq: Every day | RECTAL | Status: DC | PRN

## 2018-01-09 MED ORDER — EZETIMIBE 10 MG PO TABS
5.0000 mg | ORAL_TABLET | Freq: Every day | ORAL | Status: DC
  Administered 2018-01-10 – 2018-01-14 (×4): 5 mg via ORAL
  Filled 2018-01-09 (×4): qty 1

## 2018-01-09 MED ORDER — MIDAZOLAM HCL 2 MG/2ML IJ SOLN
INTRAMUSCULAR | Status: DC | PRN
  Administered 2018-01-09: 1 mg via INTRAVENOUS

## 2018-01-09 MED ORDER — SODIUM CHLORIDE 0.9 % IV SOLN
INTRAVENOUS | Status: AC
  Filled 2018-01-09: qty 1.2

## 2018-01-09 MED ORDER — METFORMIN HCL 500 MG PO TABS
500.0000 mg | ORAL_TABLET | Freq: Two times a day (BID) | ORAL | Status: DC
  Administered 2018-01-10 – 2018-01-14 (×8): 500 mg via ORAL
  Filled 2018-01-09 (×8): qty 1

## 2018-01-09 MED ORDER — MIDAZOLAM HCL 2 MG/2ML IJ SOLN
INTRAMUSCULAR | Status: AC
  Filled 2018-01-09: qty 2

## 2018-01-09 MED ORDER — NITROGLYCERIN 0.4 MG SL SUBL
0.4000 mg | SUBLINGUAL_TABLET | SUBLINGUAL | Status: DC | PRN
  Filled 2018-01-09: qty 1

## 2018-01-09 MED ORDER — METOPROLOL TARTRATE 50 MG PO TABS
50.0000 mg | ORAL_TABLET | Freq: Two times a day (BID) | ORAL | Status: DC
  Administered 2018-01-10 – 2018-01-14 (×9): 50 mg via ORAL
  Filled 2018-01-09 (×9): qty 1

## 2018-01-09 MED ORDER — GABAPENTIN 300 MG PO CAPS
300.0000 mg | ORAL_CAPSULE | Freq: Every day | ORAL | Status: DC
  Administered 2018-01-10 – 2018-01-14 (×4): 300 mg via ORAL
  Filled 2018-01-09 (×4): qty 1

## 2018-01-09 MED ORDER — LACTATED RINGERS IV SOLN
INTRAVENOUS | Status: DC | PRN
  Administered 2018-01-09 (×2): via INTRAVENOUS

## 2018-01-09 MED ORDER — PANTOPRAZOLE SODIUM 40 MG PO TBEC: 40.0000 mg | DELAYED_RELEASE_TABLET | Freq: Every day | ORAL | Status: DC

## 2018-01-09 MED ORDER — PROPOFOL 10 MG/ML IV BOLUS
INTRAVENOUS | Status: DC | PRN
  Administered 2018-01-09 (×2): 20 mg via INTRAVENOUS
  Administered 2018-01-09: 90 mg via INTRAVENOUS
  Administered 2018-01-09: 20 mg via INTRAVENOUS

## 2018-01-09 MED ORDER — HYDRALAZINE HCL 20 MG/ML IJ SOLN
INTRAMUSCULAR | Status: AC
Start: 2018-01-09 — End: 2018-01-09
  Administered 2018-01-09: 23:00:00
  Filled 2018-01-09: qty 1

## 2018-01-09 MED ORDER — INSULIN ASPART 100 UNIT/ML ~~LOC~~ SOLN
0.0000 [IU] | Freq: Three times a day (TID) | SUBCUTANEOUS | Status: DC
  Administered 2018-01-10 – 2018-01-11 (×2): 2 [IU] via SUBCUTANEOUS
  Administered 2018-01-11: 3 [IU] via SUBCUTANEOUS
  Administered 2018-01-12 (×3): 2 [IU] via SUBCUTANEOUS

## 2018-01-09 MED ORDER — LIDOCAINE HCL (CARDIAC) PF 100 MG/5ML IV SOSY
PREFILLED_SYRINGE | INTRAVENOUS | Status: DC | PRN
  Administered 2018-01-09: 60 mg via INTRATRACHEAL

## 2018-01-09 MED ORDER — ALUM & MAG HYDROXIDE-SIMETH 200-200-20 MG/5ML PO SUSP
15.0000 mL | ORAL | Status: DC | PRN
  Administered 2018-01-12 – 2018-01-13 (×2): 30 mL via ORAL
  Filled 2018-01-09 (×2): qty 30

## 2018-01-09 MED ORDER — HYDRALAZINE HCL 20 MG/ML IJ SOLN: 5.0000 mg | INTRAMUSCULAR | Status: DC | PRN

## 2018-01-09 MED ORDER — OXYCODONE HCL 5 MG PO TABS
5.0000 mg | ORAL_TABLET | ORAL | Status: DC | PRN
  Administered 2018-01-10 – 2018-01-12 (×6): 10 mg via ORAL
  Filled 2018-01-09 (×6): qty 2

## 2018-01-09 MED ORDER — SODIUM CHLORIDE 0.9 % IV SOLN
INTRAVENOUS | Status: DC
  Administered 2018-01-09: 17:00:00 via INTRAVENOUS

## 2018-01-09 MED ORDER — FENTANYL CITRATE (PF) 100 MCG/2ML IJ SOLN
25.0000 ug | INTRAMUSCULAR | Status: DC | PRN
  Administered 2018-01-09 (×2): 50 ug via INTRAVENOUS

## 2018-01-09 MED ORDER — PHENOL 1.4 % MT LIQD
1.0000 | OROMUCOSAL | Status: DC | PRN
  Administered 2018-01-10: 1 via OROMUCOSAL
  Filled 2018-01-09: qty 177

## 2018-01-09 MED ORDER — ADULT MULTIVITAMIN W/MINERALS CH
1.0000 | ORAL_TABLET | Freq: Every day | ORAL | Status: DC
  Administered 2018-01-10 – 2018-01-14 (×4): 1 via ORAL
  Filled 2018-01-09 (×4): qty 1

## 2018-01-09 MED ORDER — LABETALOL HCL 5 MG/ML IV SOLN: 10.0000 mg | INTRAVENOUS | Status: DC | PRN

## 2018-01-09 MED ORDER — CEFAZOLIN SODIUM-DEXTROSE 2-4 GM/100ML-% IV SOLN
2.0000 g | Freq: Three times a day (TID) | INTRAVENOUS | Status: AC
  Administered 2018-01-09 – 2018-01-10 (×2): 2 g via INTRAVENOUS
  Filled 2018-01-09: qty 100

## 2018-01-09 MED ORDER — LIDOCAINE 2% (20 MG/ML) 5 ML SYRINGE
INTRAMUSCULAR | Status: AC
  Filled 2018-01-09: qty 5

## 2018-01-09 MED ORDER — ACETAMINOPHEN 325 MG PO TABS
325.0000 mg | ORAL_TABLET | ORAL | Status: DC | PRN
  Administered 2018-01-10 – 2018-01-11 (×2): 650 mg via ORAL
  Filled 2018-01-09 (×2): qty 2

## 2018-01-09 MED ORDER — PHENYLEPHRINE 40 MCG/ML (10ML) SYRINGE FOR IV PUSH (FOR BLOOD PRESSURE SUPPORT)
PREFILLED_SYRINGE | INTRAVENOUS | Status: AC
  Filled 2018-01-09: qty 10

## 2018-01-09 MED ORDER — SODIUM CHLORIDE 0.9 % IV SOLN
INTRAVENOUS | Status: DC
  Administered 2018-01-09 – 2018-01-12 (×3): via INTRAVENOUS

## 2018-01-09 MED ORDER — POTASSIUM CHLORIDE CRYS ER 20 MEQ PO TBCR: 20.0000 meq | EXTENDED_RELEASE_TABLET | Freq: Every day | ORAL | Status: DC | PRN

## 2018-01-09 MED ORDER — FENTANYL CITRATE (PF) 100 MCG/2ML IJ SOLN
INTRAMUSCULAR | Status: AC
  Filled 2018-01-09: qty 2

## 2018-01-09 MED ORDER — MORPHINE SULFATE (PF) 2 MG/ML IV SOLN
2.0000 mg | INTRAVENOUS | Status: DC | PRN
  Administered 2018-01-10: 2 mg via INTRAVENOUS
  Filled 2018-01-09: qty 1

## 2018-01-09 MED ORDER — PANTOPRAZOLE SODIUM 40 MG PO TBEC
40.0000 mg | DELAYED_RELEASE_TABLET | Freq: Every day | ORAL | Status: DC
Start: 2018-01-10 — End: 2018-01-14
  Administered 2018-01-10 – 2018-01-14 (×4): 40 mg via ORAL
  Filled 2018-01-09 (×4): qty 1

## 2018-01-09 MED ORDER — HEPARIN SODIUM (PORCINE) 1000 UNIT/ML IJ SOLN
INTRAMUSCULAR | Status: DC | PRN
  Administered 2018-01-09: 2000 [IU] via INTRAVENOUS
  Administered 2018-01-09: 8500 [IU] via INTRAVENOUS

## 2018-01-09 MED ORDER — SODIUM CHLORIDE 0.9 % IV SOLN
INTRAVENOUS | Status: DC | PRN
  Administered 2018-01-09: 10 ug/min via INTRAVENOUS

## 2018-01-09 MED ORDER — HEMOSTATIC AGENTS (NO CHARGE) OPTIME
TOPICAL | Status: DC | PRN
  Administered 2018-01-09: 1 via TOPICAL

## 2018-01-09 MED ORDER — DIPHENHYDRAMINE HCL 50 MG/ML IJ SOLN
INTRAMUSCULAR | Status: AC
  Filled 2018-01-09: qty 1

## 2018-01-09 MED ORDER — FENTANYL CITRATE (PF) 250 MCG/5ML IJ SOLN
INTRAMUSCULAR | Status: AC
  Filled 2018-01-09: qty 5

## 2018-01-09 MED ORDER — PHENYLEPHRINE HCL 10 MG/ML IJ SOLN
INTRAMUSCULAR | Status: AC
  Filled 2018-01-09: qty 1

## 2018-01-09 MED ORDER — SODIUM CHLORIDE 0.9 % IV SOLN
INTRAVENOUS | Status: DC | PRN
  Administered 2018-01-09: 18:00:00

## 2018-01-09 MED ORDER — CEFAZOLIN SODIUM-DEXTROSE 2-4 GM/100ML-% IV SOLN
INTRAVENOUS | Status: AC
  Filled 2018-01-09: qty 100

## 2018-01-09 MED ORDER — ROCURONIUM BROMIDE 10 MG/ML (PF) SYRINGE
PREFILLED_SYRINGE | INTRAVENOUS | Status: AC
  Filled 2018-01-09: qty 10

## 2018-01-09 MED ORDER — METOPROLOL TARTRATE 5 MG/5ML IV SOLN
2.0000 mg | INTRAVENOUS | Status: DC | PRN
  Administered 2018-01-10: 5 mg via INTRAVENOUS
  Filled 2018-01-09: qty 5

## 2018-01-09 MED ORDER — FENTANYL CITRATE (PF) 100 MCG/2ML IJ SOLN
INTRAMUSCULAR | Status: AC
  Administered 2018-01-09: 23:00:00
  Filled 2018-01-09: qty 2

## 2018-01-09 MED ORDER — OXYCODONE HCL 5 MG PO TABS: 5.0000 mg | ORAL_TABLET | Freq: Once | ORAL | Status: DC | PRN

## 2018-01-09 MED ORDER — ESMOLOL HCL 100 MG/10ML IV SOLN
INTRAVENOUS | Status: DC | PRN
  Administered 2018-01-09: 10 mg via INTRAVENOUS

## 2018-01-09 MED ORDER — EPHEDRINE SULFATE 50 MG/ML IJ SOLN
INTRAMUSCULAR | Status: DC | PRN
  Administered 2018-01-09 (×2): 5 mg via INTRAVENOUS

## 2018-01-09 MED ORDER — SODIUM CHLORIDE 0.9 % IJ SOLN
INTRAMUSCULAR | Status: AC
  Filled 2018-01-09: qty 20

## 2018-01-09 MED ORDER — MAGNESIUM SULFATE 2 GM/50ML IV SOLN: 2.0000 g | Freq: Every day | INTRAVENOUS | Status: DC | PRN

## 2018-01-09 MED ORDER — FENTANYL CITRATE (PF) 100 MCG/2ML IJ SOLN
50.0000 ug | INTRAMUSCULAR | Status: DC | PRN
  Administered 2018-01-09: 50 ug via INTRAVENOUS

## 2018-01-09 MED ORDER — HEPARIN SODIUM (PORCINE) 1000 UNIT/ML IJ SOLN
INTRAMUSCULAR | Status: AC
  Filled 2018-01-09: qty 4

## 2018-01-09 MED ORDER — EPHEDRINE SULFATE 50 MG/ML IJ SOLN
INTRAMUSCULAR | Status: AC
  Filled 2018-01-09: qty 1

## 2018-01-09 MED ORDER — CEFAZOLIN SODIUM-DEXTROSE 2-4 GM/100ML-% IV SOLN
2.0000 g | INTRAVENOUS | Status: AC
  Administered 2018-01-09: 2 g via INTRAVENOUS
  Filled 2018-01-09: qty 100

## 2018-01-09 MED ORDER — CLOPIDOGREL BISULFATE 75 MG PO TABS
75.0000 mg | ORAL_TABLET | Freq: Every day | ORAL | Status: DC
  Administered 2018-01-10 – 2018-01-14 (×4): 75 mg via ORAL
  Filled 2018-01-09 (×4): qty 1

## 2018-01-09 SURGICAL SUPPLY — 67 items
BANDAGE ACE 4X5 VEL STRL LF (GAUZE/BANDAGES/DRESSINGS) IMPLANT
BANDAGE ESMARK 6X9 LF (GAUZE/BANDAGES/DRESSINGS) IMPLANT
BNDG ESMARK 6X9 LF (GAUZE/BANDAGES/DRESSINGS)
CANISTER SUCT 3000ML PPV (MISCELLANEOUS) ×4 IMPLANT
CATH EMB 3FR 80CM (CATHETERS) ×8 IMPLANT
CATH EMB 4FR 80CM (CATHETERS) ×4 IMPLANT
CATH EMB 5FR 80CM (CATHETERS) ×4 IMPLANT
CLIP VESOCCLUDE MED 24/CT (CLIP) ×8 IMPLANT
CLIP VESOCCLUDE SM WIDE 24/CT (CLIP) ×8 IMPLANT
CONNECTOR Y ATS VAC SYSTEM (MISCELLANEOUS) ×4 IMPLANT
CONT SPEC 4OZ CLIKSEAL STRL BL (MISCELLANEOUS) ×4 IMPLANT
COVER PROBE W GEL 5X96 (DRAPES) ×4 IMPLANT
CUFF TOURNIQUET SINGLE 24IN (TOURNIQUET CUFF) IMPLANT
CUFF TOURNIQUET SINGLE 34IN LL (TOURNIQUET CUFF) IMPLANT
CUFF TOURNIQUET SINGLE 44IN (TOURNIQUET CUFF) IMPLANT
DERMABOND ADHESIVE PROPEN (GAUZE/BANDAGES/DRESSINGS) ×2
DERMABOND ADVANCED .7 DNX6 (GAUZE/BANDAGES/DRESSINGS) ×2 IMPLANT
DRAIN CHANNEL 15F RND FF W/TCR (WOUND CARE) IMPLANT
DRAPE C-ARM 42X72 X-RAY (DRAPES) ×4 IMPLANT
DRSG VAC ATS LRG SENSATRAC (GAUZE/BANDAGES/DRESSINGS) ×4 IMPLANT
ELECT REM PT RETURN 9FT ADLT (ELECTROSURGICAL) ×4
ELECTRODE REM PT RTRN 9FT ADLT (ELECTROSURGICAL) ×2 IMPLANT
EVACUATOR SILICONE 100CC (DRAIN) IMPLANT
GLOVE BIO SURGEON STRL SZ7 (GLOVE) ×8 IMPLANT
GLOVE BIOGEL PI IND STRL 7.5 (GLOVE) ×6 IMPLANT
GLOVE BIOGEL PI INDICATOR 7.5 (GLOVE) ×6
GOWN STRL REUS W/ TWL LRG LVL3 (GOWN DISPOSABLE) ×10 IMPLANT
GOWN STRL REUS W/TWL LRG LVL3 (GOWN DISPOSABLE) ×10
GRAFT PROPATEN THIN WALL 6X80 (Vascular Products) ×4 IMPLANT
HEMOSTAT SPONGE AVITENE ULTRA (HEMOSTASIS) ×4 IMPLANT
INSERT FOGARTY SM (MISCELLANEOUS) IMPLANT
KIT BASIN OR (CUSTOM PROCEDURE TRAY) ×4 IMPLANT
KIT TURNOVER KIT B (KITS) ×4 IMPLANT
LOOP VESSEL MAXI BLUE (MISCELLANEOUS) ×4 IMPLANT
MARKER GRAFT CORONARY BYPASS (MISCELLANEOUS) IMPLANT
NS IRRIG 1000ML POUR BTL (IV SOLUTION) ×8 IMPLANT
PACK PERIPHERAL VASCULAR (CUSTOM PROCEDURE TRAY) ×4 IMPLANT
PAD ARMBOARD 7.5X6 YLW CONV (MISCELLANEOUS) ×8 IMPLANT
PATCH VASC XENOSURE 1CMX6CM (Vascular Products) ×2 IMPLANT
PATCH VASC XENOSURE 1X6 (Vascular Products) ×2 IMPLANT
SET MICROPUNCTURE 5F STIFF (MISCELLANEOUS) IMPLANT
STAPLER VISISTAT 35W (STAPLE) IMPLANT
STOPCOCK 4 WAY LG BORE MALE ST (IV SETS) IMPLANT
SUT ETHILON 3 0 PS 1 (SUTURE) IMPLANT
SUT GORETEX 5 0 TT13 24 (SUTURE) IMPLANT
SUT GORETEX 6.0 TT13 (SUTURE) IMPLANT
SUT MNCRL AB 4-0 PS2 18 (SUTURE) ×16 IMPLANT
SUT PROLENE 5 0 C 1 24 (SUTURE) ×8 IMPLANT
SUT PROLENE 6 0 BV (SUTURE) ×4 IMPLANT
SUT PROLENE 6 0 CC (SUTURE) ×12 IMPLANT
SUT PROLENE 7 0 BV 1 (SUTURE) IMPLANT
SUT SILK 2 0 PERMA HAND 18 BK (SUTURE) IMPLANT
SUT SILK 3 0 (SUTURE)
SUT SILK 3-0 18XBRD TIE 12 (SUTURE) IMPLANT
SUT VIC AB 2-0 CT1 27 (SUTURE) ×6
SUT VIC AB 2-0 CT1 36 (SUTURE) ×12 IMPLANT
SUT VIC AB 2-0 CT1 TAPERPNT 27 (SUTURE) ×6 IMPLANT
SUT VIC AB 3-0 SH 27 (SUTURE) ×10
SUT VIC AB 3-0 SH 27X BRD (SUTURE) ×10 IMPLANT
SYR 3ML LL SCALE MARK (SYRINGE) ×4 IMPLANT
TOWEL GREEN STERILE (TOWEL DISPOSABLE) ×4 IMPLANT
TRAY FOLEY MTR SLVR 16FR STAT (SET/KITS/TRAYS/PACK) ×4 IMPLANT
TUBING EXTENTION W/L.L. (IV SETS) IMPLANT
UNDERPAD 30X30 (UNDERPADS AND DIAPERS) ×4 IMPLANT
VACUUM HOSE 7/8X10 W/ WAND (MISCELLANEOUS) ×4 IMPLANT
WATER STERILE IRR 1000ML POUR (IV SOLUTION) ×4 IMPLANT
WND VAC CANISTER 500ML (MISCELLANEOUS) ×4 IMPLANT

## 2018-01-09 NOTE — Consult Note (Addendum)
Requested by:  Dr. Laverta Baltimore [facility])  Reason for consultation: right leg ischemia   History of Present Illness   Donald Gordon is a 77 y.o. ([birth year]) male s/p prior bypass and stenting in right leg who presents with chief complaint: passed out and woke with numbness and weakness in right foot.  Patient thinks his leg sx started around noon.  Pain is described as numbness and motor loss in right foot.  Pt is uncertain what type of bypass was done in right leg.  His work was done at the [place].  Pt is chronically on Coumadin for prior MI and atrial fibrillation.  Patient has a limited recollection of the details of his prior vascular reconstruction.  Atherosclerotic risk factors include: DM, HLD, HTN.  Past Medical History:  Diagnosis Date  . Arthritis   . Cataract   . Chronic edema    a. Chronic RLE edema.  . Coronary artery disease    a. MI s/p balloon 1996, details unclear.  . Diabetes mellitus without complication ([place])    TYPE 2   . Dysrhythmia    PROXIMAL MARGIN FIBRILATION  . GERD (gastroesophageal reflux disease)   . Hyperlipidemia   . Hypertension   . Myocardial infarction ([place])   . PAD (peripheral artery disease) ([place])    a. s/p stenting 08/2012, 02/2013.   Marland Kitchen PAF (paroxysmal atrial fibrillation) ([place])   . Thrombosis of lower extremity    a. Listed on patient's medical bracelet - at [place].    Past Surgical History:  Procedure Laterality Date  . APPENDECTOMY    . [place]  . HEMORRHOID SURGERY    . TONSILLECTOMY      Social History   Socioeconomic History  . Marital status: Legally Separated    Spouse name: Not on file  . Number of children: Not on file  . Years of education: Not on file  . Highest education level: Not on file  Occupational History  . Not on file  Social Needs  . Financial resource strain: Not on file  . Food insecurity:    Worry: Not on file    Inability: Not on file  . Transportation needs:    Medical: Not on file   Non-medical: Not on file  Tobacco Use  . Smoking status: Former Research scientist (life sciences)  . Smokeless tobacco: Never Used  . Tobacco comment: Smoked for 30 years, quit 1990s  Substance and Sexual Activity  . Alcohol use: No    Alcohol/week: 0.0 oz    Comment: Remote alcohol use  . Drug use: No  . Sexual activity: Not on file  Lifestyle  . Physical activity:    Days per week: Not on file    Minutes per session: Not on file  . Stress: Not on file  Relationships  . Social connections:    Talks on phone: Not on file    Gets together: Not on file    Attends religious service: Not on file    Active member of club or organization: Not on file    Attends meetings of clubs or organizations: Not on file    Relationship status: Not on file  . Intimate partner violence:    Fear of current or ex partner: Not on file    Emotionally abused: Not on file    Physically abused: Not on file    Forced sexual activity: Not on file  Other Topics Concern  . Not on file  Social  History Narrative  . Not on file    Family History  Problem Relation Gordon of Onset  . CAD Mother   . CAD Brother   . Hypertension Unknown     Current Facility-Administered Medications  Medication Dose Route Frequency Provider Last Rate Last Dose  . [START ON 01/10/2018] ceFAZolin (ANCEF) IVPB 2g/100 mL premix  2 g Intravenous On Call to [place], MD      . HYDROmorphone (DILAUDID) injection 1 mg  1 mg Intravenous Q1H PRN Long, Wonda [place], MD   1 mg at 01/09/18 1623   Current Outpatient Medications  Medication Sig Dispense Refill  . chlorthalidone (HYGROTON) 25 MG tablet Take 6 mg by mouth daily. Pt breaks med up into 1/4 tabs    . clopidogrel (PLAVIX) 75 MG tablet Take 75 mg by mouth daily.    Marland Kitchen ezetimibe (ZETIA) 10 MG tablet Take 5 mg by mouth daily.    Marland Kitchen gabapentin (NEURONTIN) 300 MG capsule Take 300 mg by mouth daily.    Marland Kitchen losartan (COZAAR) 50 MG tablet Take 50 mg by mouth 2 (two) times daily.    . metFORMIN (GLUCOPHAGE) 500  MG tablet Take 500 mg by mouth 2 (two) times daily with a meal.    . metoprolol (LOPRESSOR) 50 MG tablet Take 50 mg by mouth 2 (two) times daily.    . Multiple Vitamins-Minerals (MULTIVITAMIN WITH MINERALS) tablet Take 1 tablet by mouth daily.    . nitroGLYCERIN (NITROSTAT) 0.4 MG SL tablet Place 0.4 mg under the tongue every 5 (five) minutes as needed for chest pain.    . Omega-3 Fatty Acids (FISH OIL PO) Take 1 capsule by mouth daily.    . pantoprazole (PROTONIX) 40 MG tablet Take 40 mg by mouth daily.    . traMADol (ULTRAM) 50 MG tablet Take 50 mg by mouth every 6 (six) hours as needed for moderate pain.    Marland Kitchen warfarin (COUMADIN) 5 MG tablet Take 5-7.5 mg by mouth daily. Takes 5 mg on Tuesday, all other days takes 7.5 mg      Allergies  Allergen Reactions  . Statins Other (See Comments)    Muscle and Bone pain    REVIEW OF SYSTEMS (negative unless checked):   Cardiac:  []  Chest pain or chest pressure? []  Shortness of breath upon activity? []  Shortness of breath when lying flat? [x]  Irregular heart rhythm?  Vascular:  [x]  Pain in calf, thigh, or hip brought on by walking? [x]  Pain in feet at night that wakes you up from your sleep? []  Blood clot in your veins? []  Leg swelling?  Pulmonary:  []  Oxygen at home? []  Productive cough? []  Wheezing?  Neurologic:  [x]  Sudden weakness in arms or legs? [x]  Sudden numbness in arms or legs? []  Sudden onset of difficult speaking or slurred speech? []  Temporary loss of vision in one eye? []  Problems with dizziness?  Gastrointestinal:  []  Blood in stool? []  Vomited blood?  Genitourinary:  []  Burning when urinating? []  Blood in urine?  Psychiatric:  []  Major depression  Hematologic:  []  Bleeding problems? []  Problems with blood clotting?  Dermatologic:  []  Rashes or ulcers?  Constitutional:  []  Fever or chills?  Ear/Nose/Throat:  []  Change in hearing? []  Nose bleeds? []  Sore throat?  Musculoskeletal:  []  Back  pain? []  Joint pain? []  Muscle pain?   For VQI Use Only   PRE-ADM LIVING Home  AMB STATUS Ambulatory  CAD Sx History of MI, but no symptoms  No MI within 6 months  PRIOR CHF None  STRESS TEST No    Physical Examination     Vitals:   01/09/18 1550 01/09/18 1551 01/09/18 1600 01/09/18 1615  BP: (!) 186/76  (!) 187/66 (!) 180/76  Pulse: 76  72 87  Resp: 18  16 20   Temp: 98 F (36.7 C)     TempSrc: Oral     SpO2: 95%  98% 93%  Weight:  180 lb (81.6 kg)    Height:  5\' 9"  (1.753 m)     Body mass index is 26.58 kg/m.  General Alert, O x 3, WD, Ill appearing  Head [place]/AT,    Ear/Nose/ Throat Hearing grossly intact, nares without erythema or drainage, oropharynx without Erythema or Exudate, Mallampati score: 3,   Eyes PERRLA, EOMI,    Neck Supple, mid-line trachea,    Pulmonary Sym exp, good B air movt, CTA B  Cardiac Irregularly, irregular rate and rhythm, no Murmurs, No rubs, No S3,S4  Vascular Vessel Right Left  Radial Palpable Palpable  Brachial Palpable Palpable  Carotid Palpable, No Bruit Palpable, No Bruit  Aorta Not palpable N/A  Femoral Palpable Palpable  Popliteal Not palpable Not palpable  PT Not palpable Not palpable  DP Not palpable Faintly palpable    Gastro- intestinal soft, non-distended, non-tender to palpation, No guarding or rebound, no HSM, no masses, no CVAT B, No palpable prominent aortic pulse,    Musculo- skeletal M/S 5/5 throughout except RLE 0/5 at foot level, Extremities without ischemic changes except R lower leg from mid-calf down appears mottled, No edema present, No visible varicosities , No Lipodermatosclerosis present, healed incisions in R groin, calf and thigh  Neurologic Cranial nerves grossly intact, Pain and light touch intact in extremities, Motor exam as listed above  Psychiatric Judgement intact, Mood & affect appropriate for pt's clinical situation  Dermatologic See M/S exam for extremity exam, No rashes otherwise noted    Lymphatic  Palpable lymph nodes: None   Laboratory   CBC CBC Latest Ref Rng & Units 01/09/2018 07/04/2014 07/03/2014  WBC 4.0 - 10.5 K/uL 14.9(H) 7.8 13.8(H)  Hemoglobin 13.0 - 17.0 g/dL 13.7 12.6(L) 12.1(L)  Hematocrit 39.0 - 52.0 % 41.5 38.3(L) 37.1(L)  Platelets 150 - 400 K/uL 261 250 225    BMP BMP Latest Ref Rng & Units 01/09/2018 07/04/2014 07/03/2014  Glucose 70 - 99 mg/dL 170(H) 102(H) 120(H)  BUN 8 - 23 mg/dL 21 18 26(H)  Creatinine 0.61 - 1.24 mg/dL 1.93(H) 0.84 0.90  Sodium 135 - 145 mmol/L 142 140 137  Potassium 3.5 - 5.1 mmol/L 4.2 4.7 3.7  Chloride 98 - 111 mmol/L 107 107 104  CO2 22 - 32 mmol/L 21(L) 22 25  Calcium 8.9 - 10.3 mg/dL 9.6 8.7 8.4    Coagulation Lab Results  Component Value Date   INR 1.28 01/09/2018   INR 2.37 (H) 07/04/2014   INR 3.00 (H) 07/03/2014   No results found for: PTT  Lipids    Component Value Date/Time   CHOL 134 07/03/2014 0138   TRIG 118 07/03/2014 0138   HDL 40 07/03/2014 0138   CHOLHDL 3.4 07/03/2014 0138   VLDL 24 07/03/2014 0138   LDLCALC 70 07/03/2014 0138    Radiology     Dg Chest Portable 1 View  Result Date: 01/09/2018 CLINICAL DATA:  Syncopal episode today. Coronary artery disease and previous myocardial infarction. Peripheral vascular disease. EXAM: PORTABLE CHEST 1 VIEW COMPARISON:  07/02/2014 FINDINGS: Stable mild cardiomegaly.  Both lungs are clear. The visualized skeletal structures are unremarkable. IMPRESSION: Mild cardiomegaly.  No active lung disease. Electronically Signed   By: Earle Gell M.D.   On: 01/09/2018 16:13     Medical Decision Making   Donald Gordon is a 77 y.o. male who presents with: likely thrombosed right femoropopliteal bypass resulting in threatened right leg, history of prior CKD, possible dehydration leading to ARI, known CAD, afib, DM   Unclear etiology of patient's syncope.  Work-up post-op will be needed.  Also unclear exactly what prior operation was done.  Incisions look like a  Right CFA to BK pop bypass with vein exploration.  Usually more incisions are needed for vein harvest, so prosthetic bypass might have been done.  Regardless, patient is likely >5 hours in his episode of acute limb ischemia and is at high risk of limb loss.  I discussed with the patient going emergently to the OR and proceeding with: thromboembolectomy of femoropopliteal graft, possible redo femoropopliteal bypass, likely 4-compartment fasciotomy. The risk, benefits, and alternative for bypass operations were discussed with the patient.   The patient is aware the risks include but are not limited to: bleeding, infection, myocardial infarction, stroke, limb loss, nerve damage, limb edema, need for additional procedures in the future, wound complications, and inability to complete the bypass.  Given the multiple challenges in this patient and co-morbidities, I discussed with the patient he is at high risk of limb loss and morbidity and momortality. The patient is aware of these risks and agreed to proceed.   Thank you for allowing [place] to participate in this patient's care.   Adele Barthel, MD, FACS [facility] Office: [phone] Pager: [phone]  01/09/2018, 5:02 PM

## 2018-01-09 NOTE — H&P (View-Only) (Signed)
Requested by:  Dr. Laverta Baltimore [facility] ED)  Reason for consultation: right leg ischemia   History of Present Illness   Donald Gordon is a 77 y.o. (August 07, 1940) male s/p prior bypass and stenting in right leg who presents with chief complaint: passed out and woke with numbness and weakness in right foot.  Patient thinks his leg sx started around noon.  Pain is described as numbness and motor loss in right foot.  Pt is uncertain what type of bypass was done in right leg.  His work was done at the [place].  Pt is chronically on Coumadin for prior MI and atrial fibrillation.  Patient has a limited recollection of the details of his prior vascular reconstruction.  Atherosclerotic risk factors include: DM, HLD, HTN.  Past Medical History:  Diagnosis Date  . Arthritis   . Cataract   . Chronic edema    a. Chronic RLE edema.  . Coronary artery disease    a. MI s/p balloon 1996, details unclear.  . Diabetes mellitus without complication ([place])    TYPE 2   . Dysrhythmia    PROXIMAL MARGIN FIBRILATION  . GERD (gastroesophageal reflux disease)   . Hyperlipidemia   . Hypertension   . Myocardial infarction ([place])   . PAD (peripheral artery disease) ([place])    a. s/p stenting 08/2012, 02/2013.   Marland Kitchen PAF (paroxysmal atrial fibrillation) ([place])   . Thrombosis of lower extremity    a. Listed on patient's medical bracelet - at [place].    Past Surgical History:  Procedure Laterality Date  . APPENDECTOMY    . [place]  . HEMORRHOID SURGERY    . TONSILLECTOMY      Social History   Socioeconomic History  . Marital status: Legally Separated    Spouse name: Not on file  . Number of children: Not on file  . Years of education: Not on file  . Highest education level: Not on file  Occupational History  . Not on file  Social Needs  . Financial resource strain: Not on file  . Food insecurity:    Worry: Not on file    Inability: Not on file  . Transportation needs:    Medical: Not on file   Non-medical: Not on file  Tobacco Use  . Smoking status: Former Research scientist (life sciences)  . Smokeless tobacco: Never Used  . Tobacco comment: Smoked for 30 years, quit 1990s  Substance and Sexual Activity  . Alcohol use: No    Alcohol/week: 0.0 oz    Comment: Remote alcohol use  . Drug use: No  . Sexual activity: Not on file  Lifestyle  . Physical activity:    Days per week: Not on file    Minutes per session: Not on file  . Stress: Not on file  Relationships  . Social connections:    Talks on phone: Not on file    Gets together: Not on file    Attends religious service: Not on file    Active member of club or organization: Not on file    Attends meetings of clubs or organizations: Not on file    Relationship status: Not on file  . Intimate partner violence:    Fear of current or ex partner: Not on file    Emotionally abused: Not on file    Physically abused: Not on file    Forced sexual activity: Not on file  Other Topics Concern  . Not on file  Social  History Narrative  . Not on file    Family History  Problem Relation Age of Onset  . CAD Mother   . CAD Brother   . Hypertension Unknown     Current Facility-Administered Medications  Medication Dose Route Frequency Provider Last Rate Last Dose  . [START ON 01/10/2018] ceFAZolin (ANCEF) IVPB 2g/100 mL premix  2 g Intravenous On Call to Raemon, MD      . HYDROmorphone (DILAUDID) injection 1 mg  1 mg Intravenous Q1H PRN Long, Wonda [facility], MD   1 mg at 01/09/18 1623   Current Outpatient Medications  Medication Sig Dispense Refill  . chlorthalidone (HYGROTON) 25 MG tablet Take 6 mg by mouth daily. Pt breaks med up into 1/4 tabs    . clopidogrel (PLAVIX) 75 MG tablet Take 75 mg by mouth daily.    Marland Kitchen ezetimibe (ZETIA) 10 MG tablet Take 5 mg by mouth daily.    Marland Kitchen gabapentin (NEURONTIN) 300 MG capsule Take 300 mg by mouth daily.    Marland Kitchen losartan (COZAAR) 50 MG tablet Take 50 mg by mouth 2 (two) times daily.    . metFORMIN (GLUCOPHAGE) 500  MG tablet Take 500 mg by mouth 2 (two) times daily with a meal.    . metoprolol (LOPRESSOR) 50 MG tablet Take 50 mg by mouth 2 (two) times daily.    . Multiple Vitamins-Minerals (MULTIVITAMIN WITH MINERALS) tablet Take 1 tablet by mouth daily.    . nitroGLYCERIN (NITROSTAT) 0.4 MG SL tablet Place 0.4 mg under the tongue every 5 (five) minutes as needed for chest pain.    . Omega-3 Fatty Acids (FISH OIL PO) Take 1 capsule by mouth daily.    . pantoprazole (PROTONIX) 40 MG tablet Take 40 mg by mouth daily.    . traMADol (ULTRAM) 50 MG tablet Take 50 mg by mouth every 6 (six) hours as needed for moderate pain.    Marland Kitchen warfarin (COUMADIN) 5 MG tablet Take 5-7.5 mg by mouth daily. Takes 5 mg on Tuesday, all other days takes 7.5 mg      Allergies  Allergen Reactions  . Statins Other (See Comments)    Muscle and Bone pain    REVIEW OF SYSTEMS (negative unless checked):   Cardiac:  []  Chest pain or chest pressure? []  Shortness of breath upon activity? []  Shortness of breath when lying flat? [x]  Irregular heart rhythm?  Vascular:  [x]  Pain in calf, thigh, or hip brought on by walking? [x]  Pain in feet at night that wakes you up from your sleep? []  Blood clot in your veins? []  Leg swelling?  Pulmonary:  []  Oxygen at home? []  Productive cough? []  Wheezing?  Neurologic:  [x]  Sudden weakness in arms or legs? [x]  Sudden numbness in arms or legs? []  Sudden onset of difficult speaking or slurred speech? []  Temporary loss of vision in one eye? []  Problems with dizziness?  Gastrointestinal:  []  Blood in stool? []  Vomited blood?  Genitourinary:  []  Burning when urinating? []  Blood in urine?  Psychiatric:  []  Major depression  Hematologic:  []  Bleeding problems? []  Problems with blood clotting?  Dermatologic:  []  Rashes or ulcers?  Constitutional:  []  Fever or chills?  Ear/Nose/Throat:  []  Change in hearing? []  Nose bleeds? []  Sore throat?  Musculoskeletal:  []  Back  pain? []  Joint pain? []  Muscle pain?   For VQI Use Only   PRE-ADM LIVING Home  AMB STATUS Ambulatory  CAD Sx History of MI, but no symptoms  No MI within 6 months  PRIOR CHF None  STRESS TEST No    Physical Examination     Vitals:   01/09/18 1550 01/09/18 1551 01/09/18 1600 01/09/18 1615  BP: (!) 186/76  (!) 187/66 (!) 180/76  Pulse: 76  72 87  Resp: 18  16 20   Temp: 98 F (36.7 C)     TempSrc: Oral     SpO2: 95%  98% 93%  Weight:  180 lb (81.6 kg)    Height:  5\' 9"  (1.753 m)     Body mass index is 26.58 kg/m.  General Alert, O x 3, WD, Ill appearing  Head [place]/AT,    Ear/Nose/ Throat Hearing grossly intact, nares without erythema or drainage, oropharynx without Erythema or Exudate, Mallampati score: 3,   Eyes PERRLA, EOMI,    Neck Supple, mid-line trachea,    Pulmonary Sym exp, good B air movt, CTA B  Cardiac Irregularly, irregular rate and rhythm, no Murmurs, No rubs, No S3,S4  Vascular Vessel Right Left  Radial Palpable Palpable  Brachial Palpable Palpable  Carotid Palpable, No Bruit Palpable, No Bruit  Aorta Not palpable N/A  Femoral Palpable Palpable  Popliteal Not palpable Not palpable  PT Not palpable Not palpable  DP Not palpable Faintly palpable    Gastro- intestinal soft, non-distended, non-tender to palpation, No guarding or rebound, no HSM, no masses, no CVAT B, No palpable prominent aortic pulse,    Musculo- skeletal M/S 5/5 throughout except RLE 0/5 at foot level, Extremities without ischemic changes except R lower leg from mid-calf down appears mottled, No edema present, No visible varicosities , No Lipodermatosclerosis present, healed incisions in R groin, calf and thigh  Neurologic Cranial nerves grossly intact, Pain and light touch intact in extremities, Motor exam as listed above  Psychiatric Judgement intact, Mood & affect appropriate for pt's clinical situation  Dermatologic See M/S exam for extremity exam, No rashes otherwise noted    Lymphatic  Palpable lymph nodes: None   Laboratory   CBC CBC Latest Ref Rng & Units 01/09/2018 07/04/2014 07/03/2014  WBC 4.0 - 10.5 K/uL 14.9(H) 7.8 13.8(H)  Hemoglobin 13.0 - 17.0 g/dL 13.7 12.6(L) 12.1(L)  Hematocrit 39.0 - 52.0 % 41.5 38.3(L) 37.1(L)  Platelets 150 - 400 K/uL 261 250 225    BMP BMP Latest Ref Rng & Units 01/09/2018 07/04/2014 07/03/2014  Glucose 70 - 99 mg/dL 170(H) 102(H) 120(H)  BUN 8 - 23 mg/dL 21 18 26(H)  Creatinine 0.61 - 1.24 mg/dL 1.93(H) 0.84 0.90  Sodium 135 - 145 mmol/L 142 140 137  Potassium 3.5 - 5.1 mmol/L 4.2 4.7 3.7  Chloride 98 - 111 mmol/L 107 107 104  CO2 22 - 32 mmol/L 21(L) 22 25  Calcium 8.9 - 10.3 mg/dL 9.6 8.7 8.4    Coagulation Lab Results  Component Value Date   INR 1.28 01/09/2018   INR 2.37 (H) 07/04/2014   INR 3.00 (H) 07/03/2014   No results found for: PTT  Lipids    Component Value Date/Time   CHOL 134 07/03/2014 0138   TRIG 118 07/03/2014 0138   HDL 40 07/03/2014 0138   CHOLHDL 3.4 07/03/2014 0138   VLDL 24 07/03/2014 0138   LDLCALC 70 07/03/2014 0138    Radiology     Dg Chest Portable 1 View  Result Date: 01/09/2018 CLINICAL DATA:  Syncopal episode today. Coronary artery disease and previous myocardial infarction. Peripheral vascular disease. EXAM: PORTABLE CHEST 1 VIEW COMPARISON:  07/02/2014 FINDINGS: Stable mild cardiomegaly.  Both lungs are clear. The visualized skeletal structures are unremarkable. IMPRESSION: Mild cardiomegaly.  No active lung disease. Electronically Signed   By: Earle Gell M.D.   On: 01/09/2018 16:13     Medical Decision Making   DAE HIGHLEY is a 77 y.o. male who presents with: likely thrombosed right femoropopliteal bypass resulting in threatened right leg, history of prior CKD, possible dehydration leading to ARI, known CAD, afib, DM   Unclear etiology of patient's syncope.  Work-up post-op will be needed.  Also unclear exactly what prior operation was done.  Incisions look like a  Right CFA to BK pop bypass with vein exploration.  Usually more incisions are needed for vein harvest, so prosthetic bypass might have been done.  Regardless, patient is likely >5 hours in his episode of acute limb ischemia and is at high risk of limb loss.  I discussed with the patient going emergently to the OR and proceeding with: thromboembolectomy of femoropopliteal graft, possible redo femoropopliteal bypass, likely 4-compartment fasciotomy. The risk, benefits, and alternative for bypass operations were discussed with the patient.   The patient is aware the risks include but are not limited to: bleeding, infection, myocardial infarction, stroke, limb loss, nerve damage, limb edema, need for additional procedures in the future, wound complications, and inability to complete the bypass.  Given the multiple challenges in this patient and co-morbidities, I discussed with the patient he is at high risk of limb loss and morbidity and momortality. The patient is aware of these risks and agreed to proceed.   Thank you for allowing [place] to participate in this patient's care.   Adele Barthel, MD, FACS [facility] Office: [phone] Pager: [phone]  01/09/2018, 5:02 PM

## 2018-01-09 NOTE — Anesthesia Procedure Notes (Signed)
Procedure Name: Intubation Date/Time: 01/09/2018 5:49 PM Performed by: Adalberto Ill, CRNA Pre-anesthesia Checklist: Patient identified, Emergency Drugs available, Suction available, Patient being monitored and Timeout performed Patient Re-evaluated:Patient Re-evaluated prior to induction Oxygen Delivery Method: Circle system utilized Preoxygenation: Pre-oxygenation with 100% oxygen Induction Type: IV induction Ventilation: Mask ventilation without difficulty Laryngoscope Size: Miller and 2 Grade View: Grade I Tube type: Oral Tube size: 7.5 mm Number of attempts: 1 (brief atraumatic ) Airway Equipment and Method: Stylet Placement Confirmation: ETT inserted through vocal cords under direct vision,  positive ETCO2 and breath sounds checked- equal and bilateral Secured at: 21 cm Tube secured with: Tape Dental Injury: Teeth and Oropharynx as per pre-operative assessment

## 2018-01-09 NOTE — Transfer of Care (Signed)
Immediate Anesthesia Transfer of Care Note  Patient: Donald Gordon  Procedure(s) Performed: THROMBECTOMY RIGHT LOWER LEG (Right Leg Lower) PATCH ANGIOPLASTY USING XENOSURE BIOLOGIC 1CM X 6CM PATCH (Right Leg Lower) RIGHT FEMORAL-POPLITEAL ARTERY BYPASS USING PROPATEN 6MM X 80CM VASCULAR GRAFT (Right Groin) FASCIOTOMY RIGHT LOWER LEG (Right Leg Lower) APPLICATION OF WOUND VAC ON RIGHT LOWER LEG (Right Leg Lower)  Patient Location: PACU  Anesthesia Type:General  Level of Consciousness: awake, alert  and patient cooperative  Airway & Oxygen Therapy: Patient Spontanous Breathing and Patient connected to nasal cannula oxygen  Post-op Assessment: Report given to RN and Post -op Vital signs reviewed and stable  Post vital signs: Reviewed and stable  Last Vitals:  Vitals Value Taken Time  BP 152/77 01/09/2018  9:32 PM  Temp    Pulse 93 01/09/2018  9:34 PM  Resp 18 01/09/2018  9:34 PM  SpO2 89 % 01/09/2018  9:34 PM  Vitals shown include unvalidated device data.  Last Pain:  Vitals:   01/09/18 1625  TempSrc:   PainSc: 10-Worst pain ever         Complications: No apparent anesthesia complications

## 2018-01-09 NOTE — Op Note (Addendum)
OPERATIVE NOTE   PROCEDURE: 1. Redo exposure of right common femoral artery and below-the-knee popliteal artery  2. Thrombectomy of right anterior tibial artery, tibioperoneal trunk, peroneal artery, and below-the-knee popliteal artery  3. Bovine patch angiopalsty of right below-the-knee popliteal artery  4.   Right common femoral artery to below-the-knee popliteal artery bypass with Propaten 5.   Four compartment fasciotomy of right calf  PRE-OPERATIVE DIAGNOSIS: Acute limb ischemia right leg  POST-OPERATIVE DIAGNOSIS: same  SURGEON: Adele Barthel, MD  ASSISTANT(S): RNFA  ANESTHESIA: general  ESTIMATED BLOOD LOSS: 250 cc  FINDING(S): 1.  No evidence of femoropopliteal bypass.   2.  Findings are more consistent with prior thrombectomy on common femoral artery and below-the-knee popliteal artery: suture at both locations 3.  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  Sub-acute thrombus in anterior tibial artery: return of retrograde bleeding after thrombectomy 5.  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  No thrombus in common femoral artery  7.  Dopplerable anterior tibial artery and posterior tibial artery signals after femoropopliteal bypass: aborts with graft compression  SPECIMEN(S):  none  INDICATIONS:   Donald Gordon is a 77 y.o. male who presents with acute limb ischemia in right leg after a 5-6 hours duration.  The patient was not clear what prior procedure had been done in his right leg.  Based on exam, I recommended: thrombectomy of femoropopliteal graft, possible redo femoropopliteal bypass, and four-compartment fasciotomy.  The risk, benefits, and alternative for bypass operations were discussed with the patient.   The patient is aware the risks include but are not limited to: bleeding, infection, myocardial infarction, stroke, limb loss, nerve damage, limb edema, need for additional procedures in the future, wound complications, and inability to complete the bypass. The patient was made aware of the increase risk of limb loss given the duration of his process and increase morbidity and mortality.  The patient is aware of these risks and agreed to proceed.  DESCRIPTION: After obtaining full informed written consent, the patient was brought back to the operating room and placed supine upon the operating table.  The patient received IV antibiotics prior to induction.  A procedure time out was completed and the correct surgical site was verified.  After obtaining adequate anesthesia, the patient was prepped and draped in the standard fashion for: right femoropopliteal bypass.  Note at the beginning of this case, the right foot was mottled with early signs of rigor mortis.    I turned my attention to the right calf.  I placed a bump in the right knee and externally rotated the knee.  I made an incision through the prior incision.  I dissected down through the subcutaneous tissue with some difficulty due to the extensive scarring.  I came across the greater saphenous vein which appeared to thickened and not useful as conduit.  I took down the fascia into the deep posterior compartment with electrocautery.  I retracted the medial belly of the gastrocneumius muscle.  I could not see a medial popliteal vein.  I could identify the below-the-knee popliteal artery.  The lateral popliteal vein appeared to be hypertrophied.  I dissected out the below-the-knee popliteal artery with difficulty given the extensive scar tissue.  I was able to identify the bifurcation into tibioperoneal trunk and  anterior tibial artery and distal below-the-knee popliteal artery.  The distal popliteal artery demonstrated an injury at the prior arteriotomy,  which was marked with Prolene stitches.  The arteriotomy had torn open during dissection.  There remained over 50% of intact lumen, so I felt repair with a bovine patch would be adequate.  I placed vessel loops on the anterior tibial artery, tibioperoneal trunk and below-the-knee popliteal artery.    The patient was given 8500 units of Heparin intravenously, which was a therapeutic bolus. An additional 2000 units of Heparin was administered every hour after initial bolus to maintain anticoagulation.  In total, 10500 units of Heparin was administrated to achieve and maintain a therapeutic level of anticoagulation.  I extended the below-the-knee popliteal artery arteriotomy proximally and distally with a Potts scissor.  I passed the 3 Fogarty down the anterior tibial artery.  I was able to retrieve sub-acute thrombus from the anterior tibial artery, regaining backbleeding after two passes.  After another two passes, no further thrombus was obtained down to the level of the ankle.  I placed the anterior tibial artery under tension.  I then passed the 3 Fogarty down the tibioperoneal trunk.  The Fogarty would only go down 30 cm, tracking presumptive down the posterior tibial artery.  Chronic thrombus was retrieved from the posterior tibial artery and tibioperoneal trunk.  There was return of some backbleeding.  I placed the tibioperoneal trunk under tension.  I passed a 3 and 4 Fogarty catheter proximally, only passing 30 cm.  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  Two of the 3 Fogarty balloons ruptured on what either calcific disease or a stent.  There was return of some antegrade bleeding from the below-the-knee popliteal artery.  I sharply tailored the frayed edges of the popliteal artery arteriotomy.  I fashioned a bovine pericardial patch for the geometry of this below-the-knee popliteal artery arteriotomy.  I sewed the bovine pericardial  patch to the below-the-knee popliteal artery with a running stitch of 6-0 Prolene.    I turned my attention to the right groin.  I made an incision through the prior incision.  I dissected through the scarred subcutaneous tissue with great difficulty.  Eventually I got down to the common femoral artery.  The scar tissue was dense enough to make dissection of the femoral bifurcation difficulty.  I eventually got the superficial femoral artery and profunda femoral artery dissected out enough to identify each.  In the process dissecting out the distal common femoral artery, an injury to the posterior distal common femoral artery occurred.  I repaired this with a 6-0 Prolene stitch.  I dissected out the common femoral artery more proximally, slowly dissecting through the scar tissue.  I identified another Prolene stitch proximally.  Eventually, I could dissect enough of the proximal common femoral artery to allow clamping proximally.  Distally, I could clamped the common femoral artery proximal to the femoral bifurcation.  Due to the above-the-knee popliteal artery occlusion, I felt a femoropopliteal bypass was necessary.  As the greater saphenous vein was inadequate, I elected to use 6 mm Propaten.  I turned my attention to the calf, I dissected a tunnel in the popliteal exposure and passed a long Gore tunneler in a sub-sartorial fashion up to the right groin.  I sewed a 6 mm Propaten graft to the inner cannula.  I pulled the graft through the metal tunnel, keeping the orientation of the graft.  I sharply released the graft and then pulled out the metal tunnel, leaving the graft in place.  I clamped the common femoral artery proximally and distally.  I made an arteriotomy and extended it proximally and distally.  I spatulated the proximal end of the graft and sewed the graft to the common femoral artery with a running stitch of 5-0 Prolene in an end-to-side configuration.  I pulled up on the suture line with a  nerve hook prior to completing the anastomosis.  I clamped the graft near the new anastomosis.  I turned my attention to the calf.  I reset the exposure of the popliteal space.  I pulled the graft to tension.  I straightened out the lower leg to determine the length of graft needed.  I spatulated the distal end of the graft, shortening the graft in the process.  I placed the anterior tibial artery, tibioperoneal trunk and below-the-knee popliteal artery under tension.  I made an incision in the bovine patch.  I sewed the graft to the bovine patched below-the-knee popliteal artery with a running stitch of 6-0 Prolene.  Prior to completion of this anastomosis, I backbled the anterior tibial artery and tibioperoneal trunk.  No thrombus was present and backbleeding was still present.  There was also limited antegrade bleeding from the above-the-knee popliteal artery.  I completed the anastomosis in the usual fashion.  I released all vessel loops and clamps.  Immediately there was a pulse in the below-the-knee popliteal artery.  Distally, I could doppler a posterior tibial artery and anterior tibial artery.  I packed Avitene into all incisions.  I gave the patient 50 mg of Protamine to reverse anticoagulation.  I turned my attention to the right groin.  There was no bleeding in this incision after removing the Avitene.  I repaired the right groin with a double layer of 2-0 Vicryl immediately superficial to the graft.  The superficial subcutaneous tissue was reapproximated in a double layer of 3-0 Vicryl.  The skin was reapproximated with a running subcuticular stitch of 4-0 Monocryl.  The skin was cleaned, dried, and reinforced with Dermabond.  I turned my attention to the popliteal artery exposure.  There was no active bleeding though there was a diffuse ooze.  I extended the incision down to distal 1/3 of the calf.  I dissected out the superficial fascia and opened it with electrocautery.  I then took down the  soleus muscle to open the deep posterior compartment.  The soleus muscle already appeared to be somewhat swollen.  I washed out the medial calf exposures.  I reapproximated the medial gastrocneumius muscle to its normal anatomic position with interrupted stitches of 2-0 Vicryl, covering the bypass graft.  I then loosely reapproximated the soleus muscle, covering the tibial veins with interrupted stitches of 2-0 Vicryl.  Using retractors, I was able to extended the fasciotomy distally to the level of the ankle in a subcutaneous fashion.  I packed the medial fasciotomy with a dry lap pad.   I turned my attention the lateral calf.  I made an incision half-way between the tibia and fibula.  I made a mirror incision to the medial incision.  Using electrocautery, I opened the subcutaneous tissue and dissected down to the fascia.  I opened the anterior and lateral compartment fascia with electrocautery.  The muscle in both compartments did not look swollen.  Using retractors, I was able to extended the fasciotomy distally to the level of the ankle in a subcutaneous  fashion.  I packed the lateral fasciotomy with a dry lap pad.   I fashioned a VAC sponge for the geometry of the medial and lateral fasciotomy.  I applied the VAC dressing to the medial fasciotomy and affixed the sponge with adhesive strips.   Similarly I applied the VAC dressing to the lateral fasciotomy and affixed the sponge with adhesive strips.  I cut a hole in the medial and lateral adhesive strips and applying a lilypad to the hole medially and laterally.  The two lilypads were joined with a Y-connector.  The Y-connector was connected to the VAC pump.  The pump was engaged at 125 continuous.  Both sponges became adherent without any leak.    At the end of the case, the right foot appeared viable with resolution of the mottled appearance.  There was a dopplerable anterior tibial artery and posterior tibial artery.   COMPLICATIONS:  none  CONDITION: stable   Adele Barthel, MD, [facility] [place] Office: [phone] Pager: [phone]  01/09/2018, 9:35 PM

## 2018-01-09 NOTE — Anesthesia Preprocedure Evaluation (Signed)
Anesthesia Evaluation  Patient identified by MRN, date of birth, ID band Patient awake    Reviewed: Allergy & Precautions, NPO status , Patient's Chart, lab work & pertinent test results, reviewed documented beta blocker date and time   History of Anesthesia Complications Negative for: history of anesthetic complications  Airway Mallampati: II  TM Distance: >3 FB Neck ROM: Full    Dental  (+) Edentulous Upper, Edentulous Lower   Pulmonary former smoker,    breath sounds clear to auscultation       Cardiovascular hypertension, Pt. on medications and Pt. on home beta blockers + CAD, + Past MI, + Cardiac Stents and + Peripheral Vascular Disease  + dysrhythmias Atrial Fibrillation  Rhythm:Regular     Neuro/Psych ? Syncope today, no witness    GI/Hepatic GERD  ,  Endo/Other  diabetes, Type 2  Renal/GU Renal disease     Musculoskeletal  (+) Arthritis ,   Abdominal   Peds  Hematology Coumadin for afib   Anesthesia Other Findings MI 1996, stent 1996, PVD with RLE bypass, xlap scar for unknown surgery, [facility] not [facility], patient poor historian  Reproductive/Obstetrics                             Anesthesia Physical Anesthesia Plan  ASA: IV  Anesthesia Plan: General   Post-op Pain Management:    Induction: Intravenous  PONV Risk Score and Plan: 2 and Ondansetron and Dexamethasone  Airway Management Planned: Oral ETT  Additional Equipment: None  Intra-op Plan:   Post-operative Plan: Extubation in OR and Possible Post-op intubation/ventilation  Informed Consent: I have reviewed the patients History and Physical, chart, labs and discussed the procedure including the risks, benefits and alternatives for the proposed anesthesia with the patient or authorized representative who has indicated his/her understanding and acceptance.   Dental advisory given  Plan Discussed with: CRNA and  Surgeon  Anesthesia Plan Comments:         Anesthesia Quick Evaluation

## 2018-01-09 NOTE — Interval H&P Note (Signed)
   History and Physical Update  The patient was interviewed and re-examined.  The patient's previous History and Physical has been reviewed and is unchanged from my consult.  There is no change in the plan of care: thromboembolectomy right femoropopliteal bypass graft, possible redo femoropopliteal bypass, and four-compartment fasciotomy.   The risk, benefits, and alternative for bypass operations were discussed with the patient.    The patient is aware the risks include but are not limited to: bleeding, infection, myocardial infarction, stroke, limb loss, nerve damage, limb edema, need for additional procedures in the future, wound complications, and inability to complete the bypass.   Patient is aware he is at higher risk for limb loss and morbidity and mortality.  The patient is aware of these risks and agreed to proceed. Adele Barthel, MD, FACS [facility] [place] Office: [phone] Pager: [phone]  01/09/2018, 5:15 PM

## 2018-01-09 NOTE — ED Provider Notes (Signed)
Emergency Department Provider Note   I have reviewed the triage vital signs and the nursing notes.   HISTORY  Chief Complaint Leg Pain and Loss of Consciousness   HPI Donald Gordon is a 77 y.o. male with PMH of DM, PAD s/p vascular bypass on the right leg at the [place], HTN, HLD, and PAF on Coumadin presents to the emergency department for evaluation after syncope event while working outside.  Patient states he became very sweaty and felt lightheaded.  He sat down and shortly afterwards began having severe pain and some numbness in the right foot.  He states he has had prior bypass procedure in the leg proximally 2 years ago at the [place]. he denies any associated chest pain, abdominal pain, palpitations, shortness of breath.  The leg became acutely painful at approximately 12:30 PM.  Patient is no longer feeling lightheaded or sweating but is complaining of severe pain in the right leg.   Past Medical History:  Diagnosis Date  . Arthritis   . Cataract   . Chronic edema    a. Chronic RLE edema.  . Coronary artery disease    a. MI s/p balloon 1996, details unclear.  . Diabetes mellitus without complication ([place])    TYPE 2   . Dysrhythmia    PROXIMAL MARGIN FIBRILATION  . GERD (gastroesophageal reflux disease)   . Hyperlipidemia   . Hypertension   . Myocardial infarction ([place])   . PAD (peripheral artery disease) ([place])    a. s/p stenting 08/2012, 02/2013.   Marland Kitchen PAF (paroxysmal atrial fibrillation) ([place])   . Thrombosis of lower extremity    a. Listed on patient's medical bracelet - at [place].    Patient Active Problem List   Diagnosis Date Noted  . Ischemia of right lower extremity 01/09/2018  . Critical lower limb ischemia 01/09/2018  . Atrial fibrillation ([place]) 07/04/2014  . Atrial fibrillation with rapid ventricular response ([place])   . Leukocytosis 07/02/2014  . Paroxysmal atrial fibrillation - with RVR 07/02/2014  . Elevated troponin 07/02/2014  . Renal insufficiency 07/02/2014  .  Essential hypertension 07/02/2014  . Hypotension 07/02/2014  . CAD (coronary artery disease) 07/02/2014  . Cough 07/02/2014  . Hyperlipemia 07/02/2014  . Atrial fibrillation with RVR ([place]) 07/02/2014  . SIRS (systemic inflammatory response syndrome) ([place]) 07/02/2014  . Diabetes mellitus type 2, controlled ([place]) 07/02/2014    Past Surgical History:  Procedure Laterality Date  . APPENDECTOMY    . [place]  . HEMORRHOID SURGERY    . TONSILLECTOMY      Allergies Statins  Family History  Problem Relation Age of Onset  . CAD Mother   . CAD Brother   . Hypertension Unknown     Social History Social History   Tobacco Use  . Smoking status: Former Research scientist (life sciences)  . Smokeless tobacco: Never Used  . Tobacco comment: Smoked for 30 years, quit 1990s  Substance Use Topics  . Alcohol use: No    Alcohol/week: 0.0 oz    Comment: Remote alcohol use  . Drug use: No    Review of Systems  Constitutional: No fever/chills Eyes: No visual changes. ENT: No sore throat. Cardiovascular: Denies chest pain.  Respiratory: Denies shortness of breath. Gastrointestinal: No abdominal pain.  No nausea, no vomiting.  No diarrhea.  No constipation. Genitourinary: Negative for dysuria. Musculoskeletal: Negative for back pain. Positive pain and numbness in the right lower leg/foot.  Skin: Negative for rash. Neurological: Negative for headaches, focal  weakness. Right lower leg numbness.   10-point ROS otherwise negative.  ____________________________________________   PHYSICAL EXAM:  VITAL SIGNS: ED Triage Vitals  Enc Vitals Group     BP 01/09/18 1550 (!) 186/76     Pulse Rate 01/09/18 1550 76     Resp 01/09/18 1550 18     Temp 01/09/18 1550 98 F (36.7 C)     Temp Source 01/09/18 1550 Oral     SpO2 01/09/18 1548 93 %     Weight 01/09/18 1551 180 lb (81.6 kg)     Height 01/09/18 1551 5\' 9"  (1.753 m)     Pain Score 01/09/18 1551 10   Constitutional: Alert and oriented. Well  appearing and in no acute distress. Eyes: Conjunctivae are normal.  Head: Atraumatic. Nose: No congestion/rhinnorhea. Mouth/Throat: Mucous membranes are moist.  Neck: No stridor.  Cardiovascular: Normal rate, regular rhythm. Grossly normal heart sounds. Unable to palpate or doppler right DP/PT pulses on the right foot. Pale right forefoot.  Respiratory: Normal respiratory effort.  No retractions. Lungs CTAB. Gastrointestinal: Soft and nontender. No distention.  Musculoskeletal: No lower extremity tenderness nor edema. No gross deformities of extremities. Neurologic:  Normal speech and language. No gross focal neurologic deficits are appreciated.  Skin:  Skin is warm, dry and intact. No rash noted.   ____________________________________________   LABS (all labs ordered are listed, but only abnormal results are displayed)  Labs Reviewed  COMPREHENSIVE METABOLIC PANEL - Abnormal; Notable for the following components:      Result Value   CO2 21 (*)    Glucose, Bld 170 (*)    Creatinine, Ser 1.93 (*)    GFR calc non Af Amer 32 (*)    GFR calc Af Amer 37 (*)    All other components within normal limits  CBC WITH DIFFERENTIAL/PLATELET - Abnormal; Notable for the following components:   WBC 14.9 (*)    Neutro Abs 12.7 (*)    Monocytes Absolute 1.1 (*)    All other components within normal limits  PROTIME-INR - Abnormal; Notable for the following components:   Prothrombin Time 15.9 (*)    All other components within normal limits  GLUCOSE, CAPILLARY - Abnormal; Notable for the following components:   Glucose-Capillary 142 (*)    All other components within normal limits  I-STAT TROPONIN, ED  TYPE AND SCREEN  ABO/RH  SURGICAL PATHOLOGY   ____________________________________________  EKG   EKG Interpretation  Date/Time:  Saturday January 09 2018 15:45:05 EDT Ventricular Rate:  72 PR Interval:    QRS Duration: 130 QT Interval:  429 QTC Calculation: 470 R Axis:   17 Text  Interpretation:  Sinus rhythm Nonspecific intraventricular conduction delay Borderline repolarization abnormality No STEMI.  Confirmed by Nanda Quinton [id number]) on 01/09/2018 4:01:37 PM       ____________________________________________  RADIOLOGY  Dg Chest Portable 1 View  Result Date: 01/09/2018 CLINICAL DATA:  Syncopal episode today. Coronary artery disease and previous myocardial infarction. Peripheral vascular disease. EXAM: PORTABLE CHEST 1 VIEW COMPARISON:  07/02/2014 FINDINGS: Stable mild cardiomegaly. Both lungs are clear. The visualized skeletal structures are unremarkable. IMPRESSION: Mild cardiomegaly.  No active lung disease. Electronically Signed   By: Earle Gell M.D.   On: 01/09/2018 16:13    ____________________________________________   PROCEDURES  Procedure(s) performed:   Procedures  CRITICAL CARE Performed by: Margette Fast Total critical care time: 35 minutes Critical care time was exclusive of separately billable procedures and treating other patients. Critical care was necessary  to treat or prevent imminent or life-threatening deterioration. Critical care was time spent personally by me on the following activities: development of treatment plan with patient and/or surrogate as well as nursing, discussions with consultants, evaluation of patient's response to treatment, examination of patient, obtaining history from patient or surrogate, ordering and performing treatments and interventions, ordering and review of laboratory studies, ordering and review of radiographic studies, pulse oximetry and re-evaluation of patient's condition.  Nanda Quinton, MD Emergency Medicine  ____________________________________________   INITIAL IMPRESSION / ASSESSMENT AND PLAN / ED COURSE  Pertinent labs & imaging results that were available during my care of the patient were reviewed by me and considered in my medical decision making (see chart for details).  Patient presents  to the emergency department for evaluation of acute onset right foot pain, numbness, discoloration.  Patient is on Coumadin at home and states is been compliant.  He also had a syncope event prior to acute onset of pain.  He is not experiencing any chest or abdominal pain.  My suspicion for aortic dissection is extremely low.  Patient has a history of peripheral artery disease and had an arterial bypass in that same leg 2 years ago at the [place]. consulting vascular surgery immediately.   04:05 PM Spoke with Vascular Surgery Dr. Bridgett Larsson. No Heparin at this time. Wants a Bilateral ABI and arterial duplex on the right. He is in the OR now but will evaluate in the ED ASAP.   Dr Bridgett Larsson plans to take the patient directly to the OR. Labs reviewed. Vas imaging cancelled with plan to go straight to the OR.   Discussed patient's case with Vascular Surgery, Dr. Bridgett Larsson to request admission. Patient and family (if present) updated with plan. Care transferred to Vascular Surgery service.  I reviewed all nursing notes, vitals, pertinent old records, EKGs, labs, imaging (as available).  ____________________________________________  FINAL CLINICAL IMPRESSION(S) / ED DIAGNOSES  Final diagnoses:  Critical ischemia of foot  Right leg pain     MEDICATIONS GIVEN DURING THIS VISIT:  Medications  0.9 %  sodium chloride infusion (has no administration in time range)  magnesium sulfate IVPB 2 g 50 mL (has no administration in time range)  ceFAZolin (ANCEF) IVPB 2g/100 mL premix (2 g Intravenous New Bag/Given 01/09/18 2225)  labetalol (NORMODYNE,TRANDATE) injection 10 mg (has no administration in time range)  hydrALAZINE (APRESOLINE) injection 5 mg (has no administration in time range)  0.9 %  sodium chloride infusion ( Intravenous New Bag/Given 01/09/18 2206)  fentaNYL (SUBLIMAZE) 100 MCG/2ML injection (has no administration in time range)  hydrALAZINE (APRESOLINE) 20 MG/ML injection (has no administration in time range)    ceFAZolin (ANCEF) 2-4 GM/100ML-% IVPB (has no administration in time range)  ceFAZolin (ANCEF) IVPB 2g/100 mL premix ( Intravenous MAR Unhold 01/09/18 2251)    Note:  This document was prepared using Dragon voice recognition software and may include unintentional dictation errors.  Nanda Quinton, MD Emergency Medicine    Long, [facility], MD 01/09/18 2252

## 2018-01-09 NOTE — ED Triage Notes (Signed)
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 Pt woke up with right lower leg pain/numbness. Has hx of bypass in same leg. Unable to palpate pulse in right foot with doppler on arrival. Pt axox4. Denies shob. VSS

## 2018-01-10 ENCOUNTER — Other Ambulatory Visit: Payer: Self-pay

## 2018-01-10 DIAGNOSIS — I481 Persistent atrial fibrillation: Secondary | ICD-10-CM

## 2018-01-10 LAB — GLUCOSE, CAPILLARY
GLUCOSE-CAPILLARY: 112 mg/dL — AB (ref 70–99)
GLUCOSE-CAPILLARY: 130 mg/dL — AB (ref 70–99)
GLUCOSE-CAPILLARY: 109 mg/dL — AB (ref 70–99)
Glucose-Capillary: 126 mg/dL — ABNORMAL HIGH (ref 70–99)

## 2018-01-10 LAB — HEMOGLOBIN A1C
Hgb A1c MFr Bld: 6.5 % — ABNORMAL HIGH (ref 4.8–5.6)
MEAN PLASMA GLUCOSE: 139.85 mg/dL

## 2018-01-10 LAB — BASIC METABOLIC PANEL
Anion gap: 11 (ref 5–15)
BUN: 20 mg/dL (ref 8–23)
CO2: 24 mmol/L (ref 22–32)
Calcium: 8.3 mg/dL — ABNORMAL LOW (ref 8.9–10.3)
Chloride: 108 mmol/L (ref 98–111)
Creatinine, Ser: 1.56 mg/dL — ABNORMAL HIGH (ref 0.61–1.24)
GFR calc Af Amer: 48 mL/min — ABNORMAL LOW (ref >60)
GFR, EST NON AFRICAN AMERICAN: 41 mL/min — AB (ref >60)
GLUCOSE: 154 mg/dL — AB (ref 70–99)
Potassium: 5 mmol/L (ref 3.5–5.1)
Sodium: 143 mmol/L (ref 135–145)

## 2018-01-10 LAB — CBC
HEMATOCRIT: 36.5 % — AB (ref 39.0–52.0)
Hemoglobin: 11.9 g/dL — ABNORMAL LOW (ref 13.0–17.0)
MCH: 30.5 pg (ref 26.0–34.0)
MCHC: 32.6 g/dL (ref 30.0–36.0)
MCV: 93.6 fL (ref 78.0–100.0)
Platelets: 153 10*3/uL (ref 150–400)
RBC: 3.9 MIL/uL — ABNORMAL LOW (ref 4.22–5.81)
RDW: 13.6 % (ref 11.5–15.5)
WBC: 15.8 10*3/uL — ABNORMAL HIGH (ref 4.0–10.5)

## 2018-01-10 LAB — MAGNESIUM: MAGNESIUM: 1.8 mg/dL (ref 1.7–2.4)

## 2018-01-10 LAB — HEPARIN LEVEL (UNFRACTIONATED): HEPARIN UNFRACTIONATED: 1.56 [IU]/mL — AB (ref 0.30–0.70)

## 2018-01-10 MED ORDER — CEFAZOLIN SODIUM-DEXTROSE 1-4 GM/50ML-% IV SOLN
1.0000 g | Freq: Three times a day (TID) | INTRAVENOUS | Status: DC
  Filled 2018-01-10: qty 50

## 2018-01-10 MED ORDER — HEPARIN (PORCINE) IN NACL 100-0.45 UNIT/ML-% IJ SOLN
1000.0000 [IU]/h | INTRAMUSCULAR | Status: DC
  Administered 2018-01-10: 1000 [IU]/h via INTRAVENOUS
  Filled 2018-01-10 (×2): qty 250

## 2018-01-10 MED ORDER — DILTIAZEM HCL-DEXTROSE 100-5 MG/100ML-% IV SOLN (PREMIX)
5.0000 mg/h | INTRAVENOUS | Status: DC
  Administered 2018-01-11: 10 mg/h via INTRAVENOUS
  Filled 2018-01-10 (×3): qty 100

## 2018-01-10 MED ORDER — DILTIAZEM LOAD VIA INFUSION
15.0000 mg | Freq: Once | INTRAVENOUS | Status: AC
  Administered 2018-01-10: 15 mg via INTRAVENOUS
  Filled 2018-01-10: qty 15

## 2018-01-10 MED ORDER — HEPARIN (PORCINE) IN NACL 100-0.45 UNIT/ML-% IJ SOLN: 800.0000 [IU]/h | INTRAMUSCULAR | Status: DC

## 2018-01-10 MED ORDER — HEPARIN (PORCINE) IN NACL 100-0.45 UNIT/ML-% IJ SOLN
1000.0000 [IU]/h | INTRAMUSCULAR | Status: DC
  Administered 2018-01-11: 800 [IU]/h via INTRAVENOUS

## 2018-01-10 MED ORDER — HEPARIN (PORCINE) IN NACL 100-0.45 UNIT/ML-% IJ SOLN: 1000.0000 [IU]/h | INTRAMUSCULAR | Status: DC

## 2018-01-10 MED ORDER — CEFAZOLIN SODIUM-DEXTROSE 1-4 GM/50ML-% IV SOLN
1.0000 g | Freq: Three times a day (TID) | INTRAVENOUS | Status: DC
  Administered 2018-01-10 – 2018-01-12 (×7): 1 g via INTRAVENOUS
  Filled 2018-01-10 (×7): qty 50

## 2018-01-10 NOTE — Progress Notes (Addendum)
Second attempt: pt still tachycardic with minimal bed level activity. Plan to reattempt possibly later today if schedule permits if not then tomorrow.  OT Cancellation Note  Patient Details Name: Donald Gordon MRN: [id number] DOB: [birth year]   Cancelled Treatment:    Reason Eval/Treat Not Completed: Medical issues which prohibited therapy. Per conversation with nurse, pt recently in a-fib with elevated heart rate, given med. Plan to reattempt later this morning.   Tyrone Schimke OTR/L Pager: [phone]  01/10/2018, 8:46 AM

## 2018-01-10 NOTE — Plan of Care (Signed)
Plan of care initiated and progressing

## 2018-01-10 NOTE — Progress Notes (Signed)
ANTICOAGULATION CONSULT NOTE - Initial Consult  Pharmacy Consult for Heparin (warfarin on hold) Indication: atrial fibrillation and right leg ischemia s/p embolectomy   Allergies  Allergen Reactions  . Statins Other (See Comments)    Muscle and Bone pain    Patient Measurements: Height: 5\' 9"  (175.3 cm) Weight: 180 lb (81.6 kg) IBW/kg (Calculated) : 70.7  Vital Signs: Temp: 98.5 F (36.9 C) (07/21 0000) Temp Source: Oral (07/21 0000) BP: 155/69 (07/21 0000) Pulse Rate: 78 (07/21 0000)  Labs: Recent Labs    01/09/18 1600 01/10/18 0028  HGB 13.7 11.9*  HCT 41.5 36.5*  PLT 261 153  LABPROT 15.9*  --   INR 1.28  --   CREATININE 1.93* 1.56*    Estimated Creatinine Clearance: 39.7 mL/min (A) (by C-G formula based on SCr of 1.56 mg/dL (H)).   Medical History: Past Medical History:  Diagnosis Date  . Arthritis   . Cataract   . Chronic edema    a. Chronic RLE edema.  . Coronary artery disease    a. MI s/p balloon 1996, details unclear.  . Diabetes mellitus without complication ([place])    TYPE 2   . Dysrhythmia    PROXIMAL MARGIN FIBRILATION  . GERD (gastroesophageal reflux disease)   . Hyperlipidemia   . Hypertension   . Myocardial infarction ([place])   . PAD (peripheral artery disease) ([place])    a. s/p stenting 08/2012, 02/2013.   Marland Kitchen PAF (paroxysmal atrial fibrillation) ([place])   . Thrombosis of lower extremity    a. Listed on patient's medical bracelet - at [place].    Assessment: 77 y/o M on warfarin PTA for afib, pt presents to the ED with right leg ischemia, pt in now s/p OR for embolectomy/bypass/fasciotomy, INR is low at 1.28, Hgb down some post-op, consulted to start heparin 8 hours post-op  Goal of Therapy:  Heparin level 0.3-0.7 units/ml Monitor platelets by anticoagulation protocol: Yes   Plan:  -No bolus -Start heparin drip at 1000 units/hr at 0600 -1400 HL -Daily CBC/HL -Monitor for bleeding  Narda Bonds 01/10/2018,3:55 AM

## 2018-01-10 NOTE — Consult Note (Addendum)
Cardiology Consultation:   Patient ID: Donald Gordon; [id number]; [birth year]   Admit date: 01/09/2018 Date of Consult: 01/10/2018  Primary Care Provider: Patient, No Pcp Per Primary Cardiologist: No primary care provider on file. last seen in [facility] 2016 no outpt follow up, normally followed a [place]  Primary Electrophysiologist:  NA   Patient Profile:   Donald Gordon is a 77 y.o. male with a hx of CAD (s/p PTCA 1996) PAF, HTN, HLD, DM, PAD with stent to LE 2014, and sept 2016with LE thrombosis)  who is being seen today for the evaluation of PAF at the request of Dr. Bridgett Larsson after admit for Rt leg ischemia.  History of Present Illness:   Donald Gordon with hx of CAD, with PTCA in 1996, PAF on coumadin, HTN, HLD, PAD with prior evals.  Last echo EF 65-70% with mild concentric hypertrophy, G1DD, LA was normal in size.      Pt presented to ER 7/201/19 with syncope, and woke with Rt foot numbness and motor loss of Rt foot.   Pt was seen by Dr. Bridgett Larsson for ischemia in Rt leg and taken to OR and underwent redo exposure of Rt CFA and below knee popliteal artery, thrombectomy of Rt ant. tib artery, tibioperoneal trunk, peroneal artery and below the knee popliteal artery.   Bovine patch angiopalsty of right below-the-knee popliteal artery.  Right common femoral artery to below-the-knee popliteal artery bypass with Propaten and Four compartment fasciotomy of right calf.    Today going in and out of a fib.  IV heparin is infusing.    EKG SR with mild ST depression V 2 and V4-6 some present on prior EKGs.  Na 143 . K+ 5.0, BUN 20, Cr 1.56 down from 1.93 Mg+ 1.8  Hgb 11.9, WBC 15.8, plts 153 down from 261 on admit. Hgb A1c 6.5   PCXR:  Mild cardiomegaly.  No active lung disease.  BP today 142/75 P- 97 to 146  Currently in a fib at 140 pt is aware of rapid HR.  He tells me last time he was in was 2 months ago.  Usually when he has a fib he has pain down Lt arm, this last time it was both arms.   Yesterday was working outside and did not feel well no racing HR no dizziness went to sit down and fell back with syncope.  He woke on ground about 2 feet from below where he was sitting.   No chest pain.  He then developed leg pain.   He only goes to [place] every 3 months.  He has orgotein meds at times.  Now no SOB or chest  pain.    Past Medical History:  Diagnosis Date  . Arthritis   . Cataract   . Chronic edema    a. Chronic RLE edema.  . Coronary artery disease    a. MI s/p balloon 1996, details unclear.  . Diabetes mellitus without complication ([place])    TYPE 2   . Dysrhythmia    PROXIMAL MARGIN FIBRILATION  . GERD (gastroesophageal reflux disease)   . Hyperlipidemia   . Hypertension   . Myocardial infarction ([place])   . PAD (peripheral artery disease) ([place])    a. s/p stenting 08/2012, 02/2013.   Marland Kitchen PAF (paroxysmal atrial fibrillation) ([place])   . Thrombosis of lower extremity    a. Listed on patient's medical bracelet - at [place].    Past Surgical History:  Procedure Laterality Date  . APPENDECTOMY    .  [place]  . HEMORRHOID SURGERY    . TONSILLECTOMY       Home Medications:  Prior to Admission medications   Medication Sig Start Date End Date Taking? Authorizing Provider  chlorthalidone (HYGROTON) 25 MG tablet Take 6 mg by mouth daily. Pt breaks med up into 1/4 tabs    [provider]  clopidogrel (PLAVIX) 75 MG tablet Take 75 mg by mouth daily.    [provider]  ezetimibe (ZETIA) 10 MG tablet Take 5 mg by mouth daily.    [provider]  gabapentin (NEURONTIN) 300 MG capsule Take 300 mg by mouth daily.    [provider]  losartan (COZAAR) 50 MG tablet Take 50 mg by mouth 2 (two) times daily.    [provider]  metFORMIN (GLUCOPHAGE) 500 MG tablet Take 500 mg by mouth 2 (two) times daily with a meal.    [provider]  metoprolol (LOPRESSOR) 50 MG tablet Take 50 mg by mouth 2 (two) times daily.     [provider]  Multiple Vitamins-Minerals (MULTIVITAMIN WITH MINERALS) tablet Take 1 tablet by mouth daily.    [provider]  nitroGLYCERIN (NITROSTAT) 0.4 MG SL tablet Place 0.4 mg under the tongue every 5 (five) minutes as needed for chest pain.    [provider]  Omega-3 Fatty Acids (FISH OIL PO) Take 1 capsule by mouth daily.    [provider]  pantoprazole (PROTONIX) 40 MG tablet Take 40 mg by mouth daily.    [provider]  traMADol (ULTRAM) 50 MG tablet Take 50 mg by mouth every 6 (six) hours as needed for moderate pain.    [provider]  warfarin (COUMADIN) 5 MG tablet Take 5-7.5 mg by mouth daily. Takes 5 mg on Tuesday, all other days takes 7.5 mg    [provider]    Inpatient Medications: Scheduled Meds: . clopidogrel  75 mg Oral Daily  . docusate sodium  100 mg Oral Daily  . ezetimibe  5 mg Oral Daily  . gabapentin  300 mg Oral Daily  . insulin aspart  0-15 Units Subcutaneous TID WC  . metFORMIN  500 mg Oral BID WC  . metoprolol tartrate  50 mg Oral BID  . multivitamin with minerals  1 tablet Oral Daily  . omega-3 acid ethyl esters  1 g Oral BID  . pantoprazole  40 mg Oral Daily   Continuous Infusions: . sodium chloride    . sodium chloride 75 mL/hr at 01/09/18 2206  .  ceFAZolin (ANCEF) IV    . heparin 1,000 Units/hr (01/10/18 0601)  . magnesium sulfate 1 - 4 g bolus IVPB     PRN Meds: sodium chloride, acetaminophen **OR** acetaminophen, alum & mag hydroxide-simeth, bisacodyl, guaiFENesin-dextromethorphan, hydrALAZINE, labetalol, magnesium sulfate 1 - 4 g bolus IVPB, metoprolol tartrate, morphine injection, nitroGLYCERIN, ondansetron, oxyCODONE, phenol, polyethylene glycol, potassium chloride, sodium phosphate  Allergies:    Allergies  Allergen Reactions  . Statins Other (See Comments)    Muscle and Bone pain    Social History:   Social History   Socioeconomic History  . Marital status:  Legally Separated    Spouse name: Not on file  . Number of children: Not on file  . Years of education: Not on file  . Highest education level: Not on file  Occupational History  . Not on file  Social Needs  . Financial resource strain: Not on file  . Food insecurity:  Worry: Not on file    Inability: Not on file  . Transportation needs:    Medical: Not on file    Non-medical: Not on file  Tobacco Use  . Smoking status: Former Research scientist (life sciences)  . Smokeless tobacco: Never Used  . Tobacco comment: Smoked for 30 years, quit 1990s  Substance and Sexual Activity  . Alcohol use: No    Alcohol/week: 0.0 oz    Comment: Remote alcohol use  . Drug use: No  . Sexual activity: Not on file  Lifestyle  . Physical activity:    Days per week: Not on file    Minutes per session: Not on file  . Stress: Not on file  Relationships  . Social connections:    Talks on phone: Not on file    Gets together: Not on file    Attends religious service: Not on file    Active member of club or organization: Not on file    Attends meetings of clubs or organizations: Not on file    Relationship status: Not on file  . Intimate partner violence:    Fear of current or ex partner: Not on file    Emotionally abused: Not on file    Physically abused: Not on file    Forced sexual activity: Not on file  Other Topics Concern  . Not on file  Social History Narrative  . Not on file    Family History:    Family History  Problem Relation Age of Onset  . CAD Mother   . CAD Brother   . Hypertension Unknown      ROS:  Please see the history of present illness.  General:no colds or fevers, no weight changes Skin:no rashes or ulcers HEENT:no blurred vision, no congestion CV:see HPI PUL:see HPI GI:no diarrhea constipation or melena, no indigestion GU:no hematuria, no dysuria MS:no joint pain, no claudication Neuro:no syncope, no lightheadedness Endo:no diabetes, no thyroid disease  All other ROS reviewed  and negative.     Physical Exam/Data:   Vitals:   01/09/18 2300 01/10/18 0000 01/10/18 0402 01/10/18 0808  BP:  (!) 155/69 (!) 144/78 (!) 142/75  Pulse: 82 78    Resp: 14 14    Temp:  98.5 F (36.9 C) 98.3 F (36.8 C) 97.9 F (36.6 C)  TempSrc:  Oral Oral Oral  SpO2: 95% 96% 96%   Weight:      Height:        Intake/Output Summary (Last 24 hours) at 01/10/2018 1037 Last data filed at 01/10/2018 4403 Gross per 24 hour  Intake 3223.54 ml  Output 990 ml  Net 2233.54 ml   Filed Weights   01/09/18 1551  Weight: 180 lb (81.6 kg)   Body mass index is 26.58 kg/m.  General:  Well nourished, well developed, in no acute distress HEENT: normal Lymph: no adenopathy Neck: no JVD Endocrine:  No thryomegaly Vascular: No carotid bruits; pedal pulses with doppler Cardiac:  irreg irreg rapid; no murmur gallup rub or click Lungs:  clear to auscultation bilaterally, no wheezing, rhonchi or rales  Abd: soft, nontender, no hepatomegaly  Ext: no edema Musculoskeletal:  No deformities, BUE and BLE strength normal and equal Skin: warm and dry  Neuro:  CNs 2-12 intact, no focal abnormalities noted Psych:  Normal affect    Relevant CV Studies: Echo 06/2014  Study Conclusions  - Left ventricle: The cavity size was normal. There was mild concentric hypertrophy. Systolic function was vigorous. The estimated ejection fraction  was in the range of 65% to 70%. Wall motion was normal; there were no regional wall motion abnormalities. Doppler parameters are consistent with abnormal left ventricular relaxation (grade 1 diastolic dysfunction). There was no evidence of elevated ventricular filling pressure by Doppler parameters. - Aortic valve: Trileaflet; normal thickness leaflets. There was no regurgitation. - Aortic root: The aortic root was normal in size. - Left atrium: The atrium was normal in size. - Right ventricle: Systolic function was normal. - Right atrium: The atrium  was normal in size. - Tricuspid valve: There was trivial regurgitation. - Pulmonic valve: There was no regurgitation. - Pulmonary arteries: Systolic pressure was within the normal range. - Inferior vena cava: The vessel was normal in size. - Pericardium, extracardiac: There was no pericardial effusion.  Impressions:  - Normal biventricular size and systolic function. Impaired relaxation with normal filling pressures. Trivial mitral and tricuspid regurgitation. Normal RVSP. Laboratory Data:  Chemistry Recent Labs  Lab 01/09/18 1600 01/10/18 0028  NA 142 143  K 4.2 5.0  CL 107 108  CO2 21* 24  GLUCOSE 170* 154*  BUN 21 20  CREATININE 1.93* 1.56*  CALCIUM 9.6 8.3*  GFRNONAA 32* 41*  GFRAA 37* 48*  ANIONGAP 14 11    Recent Labs  Lab 01/09/18 1600  PROT 7.0  ALBUMIN 4.2  AST 26  ALT 25  ALKPHOS 100  BILITOT 0.7   Hematology Recent Labs  Lab 01/09/18 1600 01/10/18 0028  WBC 14.9* 15.8*  RBC 4.48 3.90*  HGB 13.7 11.9*  HCT 41.5 36.5*  MCV 92.6 93.6  MCH 30.6 30.5  MCHC 33.0 32.6  RDW 13.2 13.6  PLT 261 153   Cardiac EnzymesNo results for input(s): TROPONINI in the last 168 hours. No results for input(s): TROPIPOC in the last 168 hours.  BNPNo results for input(s): BNP, PROBNP in the last 168 hours.  DDimer No results for input(s): DDIMER in the last 168 hours.  Radiology/Studies:  Dg Chest Portable 1 View  Result Date: 01/09/2018 CLINICAL DATA:  Syncopal episode today. Coronary artery disease and previous myocardial infarction. Peripheral vascular disease. EXAM: PORTABLE CHEST 1 VIEW COMPARISON:  07/02/2014 FINDINGS: Stable mild cardiomegaly. Both lungs are clear. The visualized skeletal structures are unremarkable. IMPRESSION: Mild cardiomegaly.  No active lung disease. Electronically Signed   By: Earle Gell M.D.   On: 01/09/2018 16:13    Assessment and Plan:   1. PAF had been on coumadin--INR was 1.28 on admit --now on IV heparin will add IV dilt  --he will need INR checks more freq as outpt.  Dr. Lovena Le to see.  2. Ischemic Rt leg pot op now improved.  Per Dr. Bridgett Larsson for fasciotomy closure tomorrow.  Once cleared by surgery would go to oral anticoagulation.   3.         CAD with hx of remote PTCA [facility] ? Check echo or leave to [place]     For questions or updates, please contact [place] Please consult [email or web address] for contact info under Cardiology/STEMI.   Signed, Cecilie Kicks, NP  01/10/2018 10:37 AM   Cardiology attending   Patient seen and examined. Agree with the findings as noted above.  The patient is a very pleasant 77 year old man with a history of coronary artery disease, peripheral vascular disease, paroxysmal atrial fibrillation, who was admitted to the hospital after having acute pain in his right leg after syncopal episode.  His INR was subtherapeutic.  He is undergone fasciotomy and revascularization.  He was noted to be in atrial fibrillation with a rapid ventricular response.  He was placed on intravenous heparin.  His exam is as noted above.  Assessment and plan 1.  Paroxysmal atrial fibrillation -  His ventricular rates are not well controlled. I have recommended he initiate intravenous Cardizem.  He will need intravenous heparin until his INR is therapeutic.  We will transition to oral calcium channel blockers over the next few days. 2.  Coronary artery disease -he denies anginal symptoms.  I would not work this up further while he is in the hospital. 3.  Anticoagulation with his subtherapeutic INR, I wonder if an oral anticoagulant would be more appropriate for this patient at this time -. 4.  Syncope -etiology is unclear.  I wonder about posttermination pauses with atrial fibrillation.  Hopefully he will return to sinus rhythm and we will better be able to evaluate this.  Having the patient wear a cardiac monitor as an outpatient would be worthwhile.  Cristopher Peru, MD

## 2018-01-10 NOTE — Progress Notes (Signed)
ANTICOAGULATION CONSULT NOTE  Pharmacy Consult for Heparin Indication: atrial fibrillation and right leg ischemia s/p embolectomy   Allergies  Allergen Reactions  . Statins Other (See Comments)    Muscle and Bone pain    Patient Measurements: Height: 5\' 9"  (175.3 cm) Weight: 180 lb (81.6 kg) IBW/kg (Calculated) : 70.7  Vital Signs: Temp: 97.9 F (36.6 C) (07/21 0808) Temp Source: Oral (07/21 0808) BP: 142/75 (07/21 0808)  Labs: Recent Labs    01/09/18 1600 01/10/18 0028 01/10/18 1614  HGB 13.7 11.9*  --   HCT 41.5 36.5*  --   PLT 261 153  --   LABPROT 15.9*  --   --   INR 1.28  --   --   HEPARINUNFRC  --   --  1.56*  CREATININE 1.93* 1.56*  --     Estimated Creatinine Clearance: 39.7 mL/min (A) (by C-G formula based on SCr of 1.56 mg/dL (H)).  Assessment: 77 y/o M admitted with right leg ischemia, now s/p OR for embolectomy/bypass/fasciotomy. Pharmacy consulted to resume heparin 8 hours post-op on 7/21 AM. CBC trend down a bit post-op. No bleeding issues documented.  **Note - previously thought to be on warfarin PTA for afib when heparin initially started and adjusted for high level, but family brought in updated med list 7/21 PM and appears patient is actually on apixaban PTA, NOT warfarin.  Heparin level previously though to be elevated is now thought to be falsely elevated due to influence of apixaban. Will monitor using aPTT, next level 6 hours after previous rate change.  Goal of Therapy:  Heparin level 0.3-0.7 units/ml Monitor platelets by anticoagulation protocol: Yes   Plan:  Continue heparin at 800 units/hr for now 6h aPTT Monitor daily heparin level/aPTT/CBC, s/sx bleeding  Elicia Lamp, PharmD, BCPS Clinical Pharmacist Please check AMION for all [place] contact numbers 01/10/2018 8:07 PM

## 2018-01-10 NOTE — Progress Notes (Signed)
Pt arrived to the floor with 2 nurses, pt has a foley, alert and oriented to self. Pt has a wound vac on the right lower extremity, 11ml drained. Pt on O2 at 3 l/h. Pulses doppler on BLLE. CHG bath done, Hear monitor applied, ccmd notified. We'll continue to monitor.

## 2018-01-10 NOTE — Progress Notes (Signed)
ANTICOAGULATION CONSULT NOTE - Initial Consult  Pharmacy Consult for Heparin (warfarin on hold) Indication: atrial fibrillation and right leg ischemia s/p embolectomy   Allergies  Allergen Reactions  . Statins Other (See Comments)    Muscle and Bone pain    Patient Measurements: Height: 5\' 9"  (175.3 cm) Weight: 180 lb (81.6 kg) IBW/kg (Calculated) : 70.7  Vital Signs: Temp: 97.9 F (36.6 C) (07/21 0808) Temp Source: Oral (07/21 0808) BP: 142/75 (07/21 0808)  Labs: Recent Labs    01/09/18 1600 01/10/18 0028 01/10/18 1614  HGB 13.7 11.9*  --   HCT 41.5 36.5*  --   PLT 261 153  --   LABPROT 15.9*  --   --   INR 1.28  --   --   HEPARINUNFRC  --   --  1.56*  CREATININE 1.93* 1.56*  --     Estimated Creatinine Clearance: 39.7 mL/min (A) (by C-G formula based on SCr of 1.56 mg/dL (H)).  Assessment: 77 y/o M on warfarin PTA for afib, pt presents to the ED with right leg ischemia, pt in now s/p OR for embolectomy/bypass/fasciotomy, INR is low at 1.28, Hgb down some post-op, consulted to start heparin 8 hours post-op  Initial hep lvl 1.56 - drawn arm opposite of infusion  Goal of Therapy:  Heparin level 0.3-0.7 units/ml Monitor platelets by anticoagulation protocol: Yes   Plan:  Hold heparin x 1 hr Heparin 800 units/hr at 1915 Next lvl w am labs  Levester Fresh, PharmD, BCPS, BCCCP Clinical Pharmacist [phone]  Please check AMION for all [place] numbers  01/10/2018 6:11 PM

## 2018-01-10 NOTE — Progress Notes (Signed)
PT Cancellation Note  Patient Details Name: Donald Gordon MRN: [id number] DOB: 08-Dec-1940   Cancelled Treatment:    Reason Eval/Treat Not Completed: Medical issues which prohibited therapy   Currently tachycardic in afib;   Will continue to follow for PT eval;   Roney Marion, [place] Pager [phone] Office [phone]    Colletta [place] 01/10/2018, 8:49 AM

## 2018-01-10 NOTE — Progress Notes (Signed)
   Daily Progress Note   Assessment/Planning:   POD #1 s/p R thrombectomy pop, AT, TPT and PT, BPA BK pop, R CFA to BK pop BPG w/ Propaten, 4-comp fasciotomy   Neuro: pain controlled  Pulm: maintain sat on RA  CV: flipping in and out of afib with RVR, pt minimally sx,   metoprolol given,   will get Cardiology involved   heparin drip already running  Held ARB and diuretic due to acute on chronic renal insufficiency  GI: ok to eat  FEN: cont NS @ 75 cc/hr to rehydrate, Cr already improving  Renal: 730 cc/12 hr so adequate UOP, pt baseline on diuretics  HEME/ID: stable HCT postop, will empirically continue abx until fasciotomies close  VASC: R foot foot viable with pulse, motor weak as expected with open fasciotomies, PT/OT likely needed postop, will plan closure Mon/Tues.  May not be available OR space tomorrow   Subjective  - 1 Day Post-Op   Pain much better in R foot   Objective   Vitals:   01/09/18 2300 01/10/18 0000 01/10/18 0402 01/10/18 0808  BP:  (!) 155/69 (!) 144/78 (!) 142/75  Pulse: 82 78    Resp: 14 14    Temp:  98.5 F (36.9 C) 98.3 F (36.8 C) 97.9 F (36.6 C)  TempSrc:  Oral Oral Oral  SpO2: 95% 96% 96%   Weight:      Height:         Intake/Output Summary (Last 24 hours) at 01/10/2018 2536 Last data filed at 01/10/2018 6440 Gross per 24 hour  Intake 3223.54 ml  Output 990 ml  Net 2233.54 ml    PULM  CTAB  CV  Irr, irr, HR 100-120s  GI  soft, NTND  VASC R groin inc c/d/i, calf inc with adherent VAC  NEURO Improved sensation in foot (slight somewhat decreased), PF/DF 3/5 (pain with testing, so possibly inaccurate)    Laboratory   CBC CBC Latest Ref Rng & Units 01/10/2018 01/09/2018 07/04/2014  WBC 4.0 - 10.5 K/uL 15.8(H) 14.9(H) 7.8  Hemoglobin 13.0 - 17.0 g/dL 11.9(L) 13.7 12.6(L)  Hematocrit 39.0 - 52.0 % 36.5(L) 41.5 38.3(L)  Platelets 150 - 400 K/uL 153 261 250    BMET    Component Value Date/Time   NA 143 01/10/2018 0028     K 5.0 01/10/2018 0028   CL 108 01/10/2018 0028   CO2 24 01/10/2018 0028   GLUCOSE 154 (H) 01/10/2018 0028   BUN 20 01/10/2018 0028   CREATININE 1.56 (H) 01/10/2018 0028   CALCIUM 8.3 (L) 01/10/2018 0028   GFRNONAA 41 (L) 01/10/2018 0028   GFRAA 48 (L) 01/10/2018 0028     Adele Barthel, MD, FACS [facility] Office: [phone] Pager: [phone]  01/10/2018, 9:23 AM

## 2018-01-11 ENCOUNTER — Inpatient Hospital Stay (HOSPITAL_COMMUNITY): Payer: Medicare Other

## 2018-01-11 ENCOUNTER — Encounter (HOSPITAL_COMMUNITY)
Admission: EM | Disposition: A | Discharge status: Home Health | Payer: Self-pay | Source: Home / Self Care | Attending: Vascular Surgery

## 2018-01-11 ENCOUNTER — Encounter (HOSPITAL_COMMUNITY): Payer: Self-pay | Admitting: Anesthesiology

## 2018-01-11 ENCOUNTER — Inpatient Hospital Stay (HOSPITAL_COMMUNITY): Payer: Medicare Other | Admitting: Anesthesiology

## 2018-01-11 ENCOUNTER — Other Ambulatory Visit (HOSPITAL_COMMUNITY): Payer: Medicare Other

## 2018-01-11 DIAGNOSIS — R55 Syncope and collapse: Secondary | ICD-10-CM

## 2018-01-11 DIAGNOSIS — I480 Paroxysmal atrial fibrillation: Secondary | ICD-10-CM

## 2018-01-11 DIAGNOSIS — I2510 Atherosclerotic heart disease of native coronary artery without angina pectoris: Secondary | ICD-10-CM

## 2018-01-11 HISTORY — PX: FASCIOTOMY CLOSURE: SHX5829

## 2018-01-11 LAB — APTT
APTT: 69 s — AB (ref 24–36)
aPTT: 59 seconds — ABNORMAL HIGH (ref 24–36)

## 2018-01-11 LAB — CBC
HEMATOCRIT: 37 % — AB (ref 39.0–52.0)
HEMOGLOBIN: 11.6 g/dL — AB (ref 13.0–17.0)
MCH: 30.4 pg (ref 26.0–34.0)
MCHC: 31.4 g/dL (ref 30.0–36.0)
MCV: 96.9 fL (ref 78.0–100.0)
PLATELETS: 217 10*3/uL (ref 150–400)
RBC: 3.82 MIL/uL — AB (ref 4.22–5.81)
RDW: 13.8 % (ref 11.5–15.5)
WBC: 15 10*3/uL — AB (ref 4.0–10.5)

## 2018-01-11 LAB — BASIC METABOLIC PANEL
ANION GAP: 11 (ref 5–15)
BUN: 16 mg/dL (ref 8–23)
CO2: 26 mmol/L (ref 22–32)
Calcium: 8.1 mg/dL — ABNORMAL LOW (ref 8.9–10.3)
Chloride: 102 mmol/L (ref 98–111)
Creatinine, Ser: 1.44 mg/dL — ABNORMAL HIGH (ref 0.61–1.24)
GFR calc non Af Amer: 45 mL/min — ABNORMAL LOW (ref >60)
GFR, EST AFRICAN AMERICAN: 53 mL/min — AB (ref >60)
Glucose, Bld: 131 mg/dL — ABNORMAL HIGH (ref 70–99)
Potassium: 4.5 mmol/L (ref 3.5–5.1)
Sodium: 139 mmol/L (ref 135–145)

## 2018-01-11 LAB — SURGICAL PCR SCREEN
MRSA, PCR: NEGATIVE
Staphylococcus aureus: NEGATIVE

## 2018-01-11 LAB — GLUCOSE, CAPILLARY
GLUCOSE-CAPILLARY: 122 mg/dL — AB (ref 70–99)
GLUCOSE-CAPILLARY: 128 mg/dL — AB (ref 70–99)
GLUCOSE-CAPILLARY: 174 mg/dL — AB (ref 70–99)
Glucose-Capillary: 125 mg/dL — ABNORMAL HIGH (ref 70–99)
Glucose-Capillary: 123 mg/dL — ABNORMAL HIGH (ref 70–99)
Glucose-Capillary: 132 mg/dL — ABNORMAL HIGH (ref 70–99)
Glucose-Capillary: 158 mg/dL — ABNORMAL HIGH (ref 70–99)

## 2018-01-11 LAB — HEPARIN LEVEL (UNFRACTIONATED)
Heparin Unfractionated: 1.16 IU/mL — ABNORMAL HIGH (ref 0.30–0.70)
Heparin Unfractionated: 1.2 IU/mL — ABNORMAL HIGH (ref 0.30–0.70)

## 2018-01-11 SURGERY — FASCIOTOMY CLOSURE
Anesthesia: General | Site: Leg Lower | Laterality: Right

## 2018-01-11 MED ORDER — MIDAZOLAM HCL 2 MG/2ML IJ SOLN
INTRAMUSCULAR | Status: AC
  Filled 2018-01-11: qty 2

## 2018-01-11 MED ORDER — FENTANYL CITRATE (PF) 100 MCG/2ML IJ SOLN
25.0000 ug | INTRAMUSCULAR | Status: DC | PRN
  Administered 2018-01-11: 50 ug via INTRAVENOUS

## 2018-01-11 MED ORDER — PROPOFOL 10 MG/ML IV BOLUS
INTRAVENOUS | Status: DC | PRN
  Administered 2018-01-11: 120 mg via INTRAVENOUS

## 2018-01-11 MED ORDER — SODIUM CHLORIDE 0.9 % IJ SOLN
INTRAMUSCULAR | Status: AC
  Filled 2018-01-11: qty 10

## 2018-01-11 MED ORDER — LACTATED RINGERS IV SOLN
INTRAVENOUS | Status: DC
  Administered 2018-01-11: 09:00:00 via INTRAVENOUS

## 2018-01-11 MED ORDER — ROCURONIUM BROMIDE 10 MG/ML (PF) SYRINGE
PREFILLED_SYRINGE | INTRAVENOUS | Status: AC
  Filled 2018-01-11: qty 10

## 2018-01-11 MED ORDER — SUGAMMADEX SODIUM 200 MG/2ML IV SOLN
INTRAVENOUS | Status: DC | PRN
  Administered 2018-01-11: 200 mg via INTRAVENOUS

## 2018-01-11 MED ORDER — HYDROCHLOROTHIAZIDE 12.5 MG PO CAPS
12.5000 mg | ORAL_CAPSULE | Freq: Every day | ORAL | Status: DC
  Administered 2018-01-12 – 2018-01-14 (×3): 12.5 mg via ORAL
  Filled 2018-01-11 (×3): qty 1

## 2018-01-11 MED ORDER — LIDOCAINE 2% (20 MG/ML) 5 ML SYRINGE
INTRAMUSCULAR | Status: AC
  Filled 2018-01-11: qty 5

## 2018-01-11 MED ORDER — DILTIAZEM HCL ER COATED BEADS 180 MG PO CP24
180.0000 mg | ORAL_CAPSULE | Freq: Every day | ORAL | Status: DC
  Administered 2018-01-11 – 2018-01-14 (×4): 180 mg via ORAL
  Filled 2018-01-11 (×7): qty 1

## 2018-01-11 MED ORDER — SUGAMMADEX SODIUM 200 MG/2ML IV SOLN
INTRAVENOUS | Status: AC
  Filled 2018-01-11: qty 2

## 2018-01-11 MED ORDER — EPHEDRINE SULFATE 50 MG/ML IJ SOLN
INTRAMUSCULAR | Status: AC
  Filled 2018-01-11: qty 1

## 2018-01-11 MED ORDER — LIDOCAINE 2% (20 MG/ML) 5 ML SYRINGE
INTRAMUSCULAR | Status: DC | PRN
  Administered 2018-01-11: 100 mg via INTRAVENOUS

## 2018-01-11 MED ORDER — FENTANYL CITRATE (PF) 100 MCG/2ML IJ SOLN
INTRAMUSCULAR | Status: AC
  Filled 2018-01-11: qty 2

## 2018-01-11 MED ORDER — FENTANYL CITRATE (PF) 250 MCG/5ML IJ SOLN
INTRAMUSCULAR | Status: AC
  Filled 2018-01-11: qty 5

## 2018-01-11 MED ORDER — FENTANYL CITRATE (PF) 100 MCG/2ML IJ SOLN
INTRAMUSCULAR | Status: DC | PRN
  Administered 2018-01-11: 100 ug via INTRAVENOUS

## 2018-01-11 MED ORDER — MEMANTINE HCL 10 MG PO TABS
10.0000 mg | ORAL_TABLET | Freq: Two times a day (BID) | ORAL | Status: DC
  Administered 2018-01-11 – 2018-01-14 (×7): 10 mg via ORAL
  Filled 2018-01-11 (×7): qty 1

## 2018-01-11 MED ORDER — MEPERIDINE HCL 50 MG/ML IJ SOLN: 6.2500 mg | INTRAMUSCULAR | Status: DC | PRN

## 2018-01-11 MED ORDER — ROCURONIUM BROMIDE 10 MG/ML (PF) SYRINGE
PREFILLED_SYRINGE | INTRAVENOUS | Status: DC | PRN
  Administered 2018-01-11: 40 mg via INTRAVENOUS

## 2018-01-11 MED ORDER — DILTIAZEM HCL ER 180 MG PO CP24: 180.0000 mg | ORAL_CAPSULE | Freq: Every day | ORAL | Status: DC

## 2018-01-11 MED ORDER — ASPIRIN EC 81 MG PO TBEC
81.0000 mg | DELAYED_RELEASE_TABLET | Freq: Every day | ORAL | Status: DC
  Administered 2018-01-11 – 2018-01-13 (×3): 81 mg via ORAL
  Filled 2018-01-11 (×3): qty 1

## 2018-01-11 MED ORDER — DEXAMETHASONE SODIUM PHOSPHATE 10 MG/ML IJ SOLN
INTRAMUSCULAR | Status: DC | PRN
  Administered 2018-01-11: 10 mg via INTRAVENOUS

## 2018-01-11 MED ORDER — PROPOFOL 10 MG/ML IV BOLUS
INTRAVENOUS | Status: AC
Start: 2018-01-11 — End: ?
  Filled 2018-01-11: qty 20

## 2018-01-11 MED ORDER — RIVASTIGMINE TARTRATE 1.5 MG PO CAPS
6.0000 mg | ORAL_CAPSULE | Freq: Two times a day (BID) | ORAL | Status: DC
  Administered 2018-01-11 – 2018-01-14 (×7): 6 mg via ORAL
  Filled 2018-01-11 (×8): qty 4

## 2018-01-11 MED ORDER — TAMSULOSIN HCL 0.4 MG PO CAPS
0.4000 mg | ORAL_CAPSULE | Freq: Every day | ORAL | Status: DC
  Administered 2018-01-11 – 2018-01-13 (×3): 0.4 mg via ORAL
  Filled 2018-01-11 (×3): qty 1

## 2018-01-11 MED ORDER — PROMETHAZINE HCL 25 MG/ML IJ SOLN: 6.2500 mg | INTRAMUSCULAR | Status: DC | PRN

## 2018-01-11 MED ORDER — ONDANSETRON HCL 4 MG/2ML IJ SOLN
INTRAMUSCULAR | Status: DC | PRN
  Administered 2018-01-11: 4 mg via INTRAVENOUS

## 2018-01-11 MED ORDER — APIXABAN 5 MG PO TABS
5.0000 mg | ORAL_TABLET | Freq: Two times a day (BID) | ORAL | Status: DC
  Administered 2018-01-11 – 2018-01-14 (×7): 5 mg via ORAL
  Filled 2018-01-11 (×7): qty 1

## 2018-01-11 MED ORDER — RAMIPRIL 10 MG PO CAPS
10.0000 mg | ORAL_CAPSULE | Freq: Two times a day (BID) | ORAL | Status: DC
  Administered 2018-01-11 – 2018-01-14 (×7): 10 mg via ORAL
  Filled 2018-01-11 (×7): qty 1

## 2018-01-11 MED ORDER — DEXAMETHASONE SODIUM PHOSPHATE 10 MG/ML IJ SOLN
INTRAMUSCULAR | Status: AC
  Filled 2018-01-11: qty 1

## 2018-01-11 MED ORDER — 0.9 % SODIUM CHLORIDE (POUR BTL) OPTIME
TOPICAL | Status: DC | PRN
  Administered 2018-01-11: 1000 mL

## 2018-01-11 MED ORDER — FENTANYL CITRATE (PF) 100 MCG/2ML IJ SOLN
INTRAMUSCULAR | Status: AC
  Administered 2018-01-11: 50 ug via INTRAVENOUS
  Filled 2018-01-11: qty 2

## 2018-01-11 SURGICAL SUPPLY — 35 items
BANDAGE ACE 4X5 VEL STRL LF (GAUZE/BANDAGES/DRESSINGS) IMPLANT
BANDAGE ACE 6X5 VEL STRL LF (GAUZE/BANDAGES/DRESSINGS) IMPLANT
BENZOIN TINCTURE PRP APPL 2/3 (GAUZE/BANDAGES/DRESSINGS) ×3 IMPLANT
BNDG GAUZE ELAST 4 BULKY (GAUZE/BANDAGES/DRESSINGS) IMPLANT
BRUSH SCRUB EZ PLAIN DRY (MISCELLANEOUS) ×6 IMPLANT
CANISTER SUCT 3000ML PPV (MISCELLANEOUS) ×3 IMPLANT
COVER SURGICAL LIGHT HANDLE (MISCELLANEOUS) ×3 IMPLANT
DRAPE INCISE IOBAN 66X45 STRL (DRAPES) IMPLANT
DRAPE ORTHO SPLIT 77X108 STRL (DRAPES) ×4
DRAPE SURG ORHT 6 SPLT 77X108 (DRAPES) ×2 IMPLANT
DRSG ADAPTIC 3X8 NADH LF (GAUZE/BANDAGES/DRESSINGS) ×3 IMPLANT
DRSG COVADERM 4X14 (GAUZE/BANDAGES/DRESSINGS) ×3 IMPLANT
ELECT REM PT RETURN 9FT ADLT (ELECTROSURGICAL) ×3
ELECTRODE REM PT RTRN 9FT ADLT (ELECTROSURGICAL) ×1 IMPLANT
GAUZE SPONGE 4X4 12PLY STRL (GAUZE/BANDAGES/DRESSINGS) ×3 IMPLANT
GLOVE BIO SURGEON STRL SZ7 (GLOVE) ×3 IMPLANT
GLOVE BIOGEL PI IND STRL 7.5 (GLOVE) ×1 IMPLANT
GLOVE BIOGEL PI INDICATOR 7.5 (GLOVE) ×2
GOWN STRL REUS W/ TWL LRG LVL3 (GOWN DISPOSABLE) ×3 IMPLANT
GOWN STRL REUS W/TWL LRG LVL3 (GOWN DISPOSABLE) ×6
KIT BASIN OR (CUSTOM PROCEDURE TRAY) ×3 IMPLANT
KIT TURNOVER KIT B (KITS) ×3 IMPLANT
NS IRRIG 1000ML POUR BTL (IV SOLUTION) ×3 IMPLANT
PACK GENERAL/GYN (CUSTOM PROCEDURE TRAY) ×3 IMPLANT
PACK UNIVERSAL I (CUSTOM PROCEDURE TRAY) IMPLANT
PAD ARMBOARD 7.5X6 YLW CONV (MISCELLANEOUS) ×6 IMPLANT
STAPLER VISISTAT 35W (STAPLE) ×6 IMPLANT
SUT ETHILON 2 0 PSLX (SUTURE) ×21 IMPLANT
SUT ETHILON 3 0 PS 1 (SUTURE) IMPLANT
SUT MNCRL AB 4-0 PS2 18 (SUTURE) IMPLANT
SUT VIC AB 2-0 CTX 36 (SUTURE) IMPLANT
SUT VIC AB 3-0 SH 27 (SUTURE)
SUT VIC AB 3-0 SH 27X BRD (SUTURE) IMPLANT
TOWEL GREEN STERILE (TOWEL DISPOSABLE) ×3 IMPLANT
WATER STERILE IRR 1000ML POUR (IV SOLUTION) ×3 IMPLANT

## 2018-01-11 NOTE — Progress Notes (Signed)
Progress Note  Patient Name: Donald Gordon Date of Encounter: 01/11/2018  Primary Cardiologist: Lowanda Foster [facility]  Subjective   Mr. Stapel is back from the OR having undergone closure of his fasciotomy by Dr. Bridgett Larsson this morning.  He is back in sinus rhythm.  He denies chest pain or shortness of breath.  Inpatient Medications    Scheduled Meds: . clopidogrel  75 mg Oral Daily  . docusate sodium  100 mg Oral Daily  . ezetimibe  5 mg Oral Daily  . gabapentin  300 mg Oral Daily  . insulin aspart  0-15 Units Subcutaneous TID WC  . metFORMIN  500 mg Oral BID WC  . metoprolol tartrate  50 mg Oral BID  . midazolam      . multivitamin with minerals  1 tablet Oral Daily  . omega-3 acid ethyl esters  1 g Oral BID  . pantoprazole  40 mg Oral Daily   Continuous Infusions: . sodium chloride    . sodium chloride 75 mL/hr at 01/09/18 2206  .  ceFAZolin (ANCEF) IV 1 g (01/11/18 0543)  . diltiazem (CARDIZEM) infusion 10 mg/hr (01/11/18 0940)  . heparin 950 Units/hr (01/11/18 0940)  . lactated ringers 10 mL/hr at 01/11/18 0849  . magnesium sulfate 1 - 4 g bolus IVPB     PRN Meds: sodium chloride, acetaminophen **OR** acetaminophen, alum & mag hydroxide-simeth, bisacodyl, guaiFENesin-dextromethorphan, hydrALAZINE, labetalol, magnesium sulfate 1 - 4 g bolus IVPB, metoprolol tartrate, morphine injection, nitroGLYCERIN, ondansetron, oxyCODONE, phenol, polyethylene glycol, potassium chloride, sodium phosphate   Vital Signs    Vitals:   01/11/18 1044 01/11/18 1100 01/11/18 1115 01/11/18 1120  BP: (!) 136/92 123/72 121/60 (!) 123/57  Pulse: 70 64 68 63  Resp: 17 15 14 14   Temp: 97.7 F (36.5 C)   97.9 F (36.6 C)  TempSrc:      SpO2: 98% 92% 93% 92%  Weight:      Height:        Intake/Output Summary (Last 24 hours) at 01/11/2018 1141 Last data filed at 01/11/2018 1121 Gross per 24 hour  Intake 1770.38 ml  Output 625 ml  Net 1145.38 ml   Filed Weights   01/09/18 1551 01/11/18 0452    Weight: 180 lb (81.6 kg) 187 lb 9.8 oz (85.1 kg)    Telemetry    Normal sinus rhythm- Personally Reviewed  ECG    Not performed today- Personally Reviewed  Physical Exam   GEN: No acute distress.   Neck: No JVD Cardiac: RRR, no murmurs, rubs, or gallops.  Respiratory: Clear to auscultation bilaterally. GI: Soft, nontender, non-distended  MS: No edema; No deformity. Neuro:  Nonfocal  Psych: Normal affect   Labs    Chemistry Recent Labs  Lab 01/09/18 1600 01/10/18 0028 01/11/18 0231  NA 142 143 139  K 4.2 5.0 4.5  CL 107 108 102  CO2 21* 24 26  GLUCOSE 170* 154* 131*  BUN 21 20 16   CREATININE 1.93* 1.56* 1.44*  CALCIUM 9.6 8.3* 8.1*  PROT 7.0  --   --   ALBUMIN 4.2  --   --   AST 26  --   --   ALT 25  --   --   ALKPHOS 100  --   --   BILITOT 0.7  --   --   GFRNONAA 32* 41* 45*  GFRAA 37* 48* 53*  ANIONGAP 14 11 11      Hematology Recent Labs  Lab 01/09/18 1600 01/10/18 0028  01/11/18 0231  WBC 14.9* 15.8* 15.0*  RBC 4.48 3.90* 3.82*  HGB 13.7 11.9* 11.6*  HCT 41.5 36.5* 37.0*  MCV 92.6 93.6 96.9  MCH 30.6 30.5 30.4  MCHC 33.0 32.6 31.4  RDW 13.2 13.6 13.8  PLT 261 153 217    Cardiac EnzymesNo results for input(s): TROPONINI in the last 168 hours. No results for input(s): TROPIPOC in the last 168 hours.   BNPNo results for input(s): BNP, PROBNP in the last 168 hours.   DDimer No results for input(s): DDIMER in the last 168 hours.   Radiology    Dg Chest Portable 1 View  Result Date: 01/09/2018 CLINICAL DATA:  Syncopal episode today. Coronary artery disease and previous myocardial infarction. Peripheral vascular disease. EXAM: PORTABLE CHEST 1 VIEW COMPARISON:  07/02/2014 FINDINGS: Stable mild cardiomegaly. Both lungs are clear. The visualized skeletal structures are unremarkable. IMPRESSION: Mild cardiomegaly.  No active lung disease. Electronically Signed   By: Earle Gell M.D.   On: 01/09/2018 16:13    Cardiac Studies   None  Patient  Profile     77 y.o. male with a history of CAD status post remote PCI in 1996.  His other problems include hypertension, hyperlipidemia, diabetes and PAD.  He was admitted with critical limb ischemia.  He underwent popliteal thrombectomy with bypass grafting and fasciotomy by Dr. Bridgett Larsson.  We were asked to see him because of A. fib with RVR.  His outpatient meds are long-acting diltiazem and Eliquis.  He has converted back to normal sinus rhythm today.  Assessment & Plan    1: PAF- currently in sinus rhythm.  His outpatient meds included Eliquis which I would put him back on as well as long-acting diltiazem.  2: Essential hypertension- controlled on current medications  3: Hyperlipidemia- on Zetia  4: Coronary artery disease- stable  5: Syncope- unclear etiology.  It might be related to his PAF.   From a cardiology point of view, nothing further to add.  He is back in sinus rhythm.  He can be discharged when the primary service feels appropriate from a vascular stent point.  He can go back on his home meds including Eliquis and long-acting diltiazem and follow-up with the [facility].  [facility] will sign off.   Medication Recommendations: Restart Eliquis and long-acting diltiazem Other recommendations (labs, testing, etc):   Follow up as an outpatient: With the [facility].  For questions or updates, please contact [place] Please consult [email or web address] for contact info under Cardiology/STEMI.      Signed, Quay Burow, MD  01/11/2018, 11:41 AM

## 2018-01-11 NOTE — Interval H&P Note (Signed)
History and Physical Interval Note:  01/11/2018 9:03 AM  Donald Gordon  has presented today for surgery, with the diagnosis of RIGHT CALF FASCIOTOMIES  The various methods of treatment have been discussed with the patient and family. After consideration of risks, benefits and other options for treatment, the patient has consented to  Procedure(s): FASCIOTOMY CLOSURE RIGHT CALF (Right) as a surgical intervention .  The patient's history has been reviewed, patient examined, no change in status, stable for surgery.  I have reviewed the patient's chart and labs.  Questions were answered to the patient's satisfaction.     Adele Barthel

## 2018-01-11 NOTE — Progress Notes (Signed)
OT Cancellation    01/11/18 1000  OT Visit Information  Last OT Received On 01/11/18  Reason Eval/Treat Not Completed Patient at procedure or test/ unavailable (Pt at surgery. Will return as scheule allows. Thank you.)   [place], OTR/L Acute Rehab Pager: [phone] Office: [phone]

## 2018-01-11 NOTE — Transfer of Care (Signed)
Immediate Anesthesia Transfer of Care Note  Patient: Donald Gordon  Procedure(s) Performed: FASCIOTOMY CLOSURE RIGHT CALF (Right Leg Lower)  Patient Location: PACU  Anesthesia Type:General  Level of Consciousness: awake, oriented and patient cooperative  Airway & Oxygen Therapy: Patient Spontanous Breathing and Patient connected to face mask oxygen  Post-op Assessment: Report given to RN and Post -op Vital signs reviewed and stable  Post vital signs: Reviewed  Last Vitals:  Vitals Value Taken Time  BP 136/92 01/11/2018 10:44 AM  Temp 36.5 C 01/11/2018 10:44 AM  Pulse 63 01/11/2018 10:46 AM  Resp 16 01/11/2018 10:46 AM  SpO2 97 % 01/11/2018 10:46 AM  Vitals shown include unvalidated device data.  Last Pain:  Vitals:   01/11/18 0452  TempSrc: Oral  PainSc:          Complications: No apparent anesthesia complications

## 2018-01-11 NOTE — Op Note (Signed)
    OPERATIVE NOTE   PROCEDURE: 1. Simple closure of right medial fasciotomy (25 cm) 2. Simple closure of right lateral fasciotomy (25 cm)  PRE-OPERATIVE DIAGNOSIS: right leg acute limb ischemia, prosthetic femoropopliteal bypass  POST-OPERATIVE DIAGNOSIS: same as above   SURGEON: Adele Barthel, MD  ANESTHESIA: general  ESTIMATED BLOOD LOSS: 50 cc  FINDING(S): 1.  Viable muscle throughout all compartment 2.  Mild amount of tension in suture lines  SPECIMEN(S):  none   INDICATIONS:   Donald Gordon is a 77 y.o. male who presents with after revascularization of right leg for acute limb ischemia due to above-the-knee popliteal artery occlusion.  The patient had a prosthetic femoropopliteal bypass completed due to severe ischemia in right leg.  He also required fasciotomies as revascularization occurred >6 hours after onset of symptoms.  Care was taken during the first procedure to cover the graft with muscle.  I recommended: early washout and possible fasciotomy closure due to the presence of prosthetic graft in the wound.  The patient is aware risk include but are not limited to: bleeding, infection, nerve damage, inability to close fasciotomy incision, and need for additional procedures.   DESCRIPTION: After obtaining full informed written consent, the patient was brought back to the operating room and placed supine upon the operating table.  The patient received IV antibiotics prior to induction.  A procedure time out was completed and the correct surgical site was verified.  After obtaining adequate anesthesia, the patient was prepped and draped in the standard fashion for: right calf fasciotomy closure.  I washed off both fasciotomy wounds with sterile normal saline.  Muscle were tested in all compartments with electrocautery: intact muscle function.  There was minimal swelling in all compartments.  I closed the medial fasciotomy with a combination of 2-0 Nylon stitches to offload  tension off the skin and staples to reapproximate the skin.  This incision measured 25 cm.  The lateral fasciotomy was similarly closed with 2-0 Nylon simple sutures from the proximal and distal end, moving toward the mid-segment to offload tension on the skin.  The skin was reapproximated with staples.  The right leg was washed off and sterile dressing applied to the medial and lateral incisions..   COMPLICATIONS: none  CONDITION: stable   Adele Barthel, MD, [facility] [place] Office: [phone] Pager: [phone]  01/11/2018, 10:27 AM

## 2018-01-11 NOTE — Evaluation (Addendum)
Occupational Therapy Evaluation Patient Details Name: Donald Gordon MRN: [id number] DOB: 1940-08-15 Today's Date: 01/11/2018    History of Present Illness 77 y.o. male who presenting to ED after passing out and woke with numbness and weakness of RLE. Pt reporting he had prior bypass and stenting in RLE completed at [place]. 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 PMH including cataract, DM type 2, GERD, HTN, MI, PAD, PAF, and thrombosis of LEs.   Clinical Impression   PTA, pt was living alone and was independent. Pt currently requiring Min-Mod A for LB ADLs and Min Guard A with RW for functional transfers. Pt presenting decreased strength and balance and pain at RLE. Pt would benefit from further acute OT to facilitate safe dc. Recommend dc to home once medically stable per physician with initial 24/7 support for safety.     Follow Up Recommendations  No OT follow up;Supervision/Assistance - 24 hour    Equipment Recommendations  3 in 1 bedside commode    Recommendations for Other Services PT consult     Precautions / Restrictions Precautions Precautions: Fall Restrictions Weight Bearing Restrictions: No      Mobility Bed Mobility Overal bed mobility: Needs Assistance Bed Mobility: Supine to Sit     Supine to sit: Min assist     General bed mobility comments: MIn A for managing RLE  Transfers Overall transfer level: Needs assistance Equipment used: Rolling walker (2 wheeled) Transfers: Sit to/from [facility] Sit to Stand: Supervision Stand pivot transfers: Min guard       General transfer comment: supervision-Min Guard A for safety. Pt using EOB for stability with BLEs until achieving standing    Balance Overall balance assessment: Needs assistance Sitting-balance support: No upper extremity supported;Feet supported Sitting balance-Leahy Scale: Fair     Standing  balance support: Bilateral upper extremity supported;During functional activity Standing balance-Leahy Scale: Poor Standing balance comment: Reliant on UE support                           ADL either performed or assessed with clinical judgement   ADL Overall ADL's : Needs assistance/impaired Eating/Feeding: Set up;Sitting   Grooming: Set up;Sitting   Upper Body Bathing: Set up;Supervision/ safety;Sitting   Lower Body Bathing: Minimal assistance;Sit to/from stand   Upper Body Dressing : Set up;Supervision/safety;Sitting   Lower Body Dressing: Moderate assistance;Sit to/from stand Lower Body Dressing Details (indicate cue type and reason): Donned right sock Toilet Transfer: Min guard;Stand-pivot;RW(Simulated to recliner)           Functional mobility during ADLs: Min guard;Rolling walker(stand pivot only) General ADL Comments: Pt presenting with decreased balance, strength, and acitvity tolerance compared to baseline. Feel pt with progress well with time. '     Vision         Perception     Praxis      Pertinent Vitals/Pain Pain Assessment: 0-10 Pain Score: 4  Pain Location: RLE Pain Descriptors / Indicators: Constant;Discomfort;Grimacing Pain Intervention(s): Monitored during session;Limited activity within patient's tolerance;Repositioned;Premedicated before session     Hand Dominance Right   Extremity/Trunk Assessment Upper Extremity Assessment Upper Extremity Assessment: Overall WFL for tasks assessed   Lower Extremity Assessment Lower Extremity Assessment: LLE deficits/detail LLE Deficits / Details: S/p femoropopliteal bypass LLE Coordination: decreased gross motor   Cervical / Trunk Assessment Cervical / Trunk Assessment: Normal   Communication Communication Communication: No difficulties  Cognition Arousal/Alertness: Awake/alert Behavior During Therapy: WFL for tasks assessed/performed Overall Cognitive Status: Within Functional  Limits for tasks assessed                                     General Comments  SpO2 in 90s on 2L O2    Exercises     Shoulder Instructions      Home Living Family/patient expects to be discharged to:: Private residence Living Arrangements: Alone Available Help at Discharge: Friend(s);Available PRN/intermittently Type of Home: Mobile home Home Access: Stairs to enter Entrance Stairs-Number of Steps: 3 Entrance Stairs-Rails: Can reach both Home Layout: One level     Bathroom Shower/Tub: Tub/shower unit;Curtain   Biochemist, clinical: Standard     Home Equipment: Environmental consultant - 4 wheels          Prior Functioning/Environment Level of Independence: Independent        Comments: ADLs, IADLs, driving taking care of his dog and cat.        OT Problem List: Decreased strength;Decreased range of motion;Decreased activity tolerance;Impaired balance (sitting and/or standing);Decreased knowledge of use of DME or AE;Decreased knowledge of precautions;Pain;Decreased safety awareness      OT Treatment/Interventions: Self-care/ADL training;Therapeutic exercise;Energy conservation;DME and/or AE instruction;Therapeutic activities;Patient/family education    OT Goals(Current goals can be found in the care plan section) Acute Rehab OT Goals Patient Stated Goal: "get back to my dog and cat." OT Goal Formulation: With patient Time For Goal Achievement: 01/25/18 Potential to Achieve Goals: Good  OT Frequency: Min 3X/week   Barriers to D/C:            Co-evaluation              AM-PAC PT "6 Clicks" Daily Activity     Outcome Measure Help from another person eating meals?: None Help from another person taking care of personal grooming?: None Help from another person toileting, which includes using toliet, bedpan, or urinal?: A Little Help from another person bathing (including washing, rinsing, drying)?: A Little Help from another person to put on and taking off  regular upper body clothing?: None Help from another person to put on and taking off regular lower body clothing?: A Little 6 Click Score: 21   End of Session Equipment Utilized During Treatment: Gait belt;Rolling walker;Oxygen Nurse Communication: Mobility status  Activity Tolerance: Patient tolerated treatment well Patient left: in chair;with call bell/phone within reach  OT Visit Diagnosis: Unsteadiness on feet (R26.81);Other abnormalities of gait and mobility (R26.89);Muscle weakness (generalized) (M62.81);Pain Pain - Right/Left: Right Pain - part of body: Leg                Time: 0932-6712 OT Time Calculation (min): 23 min Charges:  OT General Charges $OT Visit: 1 Visit OT Evaluation $OT Eval Moderate Complexity: 1 Mod OT Treatments $Self Care/Home Management : 8-22 mins G-Codes:     [place], OTR/L Acute Rehab Pager: [phone] Office: [place] 01/11/2018, 4:53 PM

## 2018-01-11 NOTE — Anesthesia Procedure Notes (Addendum)
Procedure Name: Intubation Date/Time: 01/11/2018 9:58 AM Performed by: Jenne Campus, CRNA Pre-anesthesia Checklist: Patient identified, Emergency Drugs available, Suction available and Patient being monitored Patient Re-evaluated:Patient Re-evaluated prior to induction Oxygen Delivery Method: Circle System Utilized Preoxygenation: Pre-oxygenation with 100% oxygen Induction Type: IV induction Ventilation: Mask ventilation without difficulty and Oral airway inserted - appropriate to patient size Laryngoscope Size: Sabra Heck and 2 Grade View: Grade I Tube type: Oral Tube size: 7.5 mm Number of attempts: 1 Airway Equipment and Method: Stylet and Oral airway Placement Confirmation: ETT inserted through vocal cords under direct vision,  positive ETCO2 and breath sounds checked- equal and bilateral Secured at: 23 cm Tube secured with: Tape Dental Injury: Teeth and Oropharynx as per pre-operative assessment

## 2018-01-11 NOTE — Progress Notes (Signed)
PT Cancellation Note  Patient Details Name: Donald Gordon MRN: [id number] DOB: [birth year]   Cancelled Treatment:    Reason Eval/Treat Not Completed: Patient at procedure or test/unavailable   Shary Decamp [facility] 01/11/2018, 10:51 AM Suanne Marker PT [phone]

## 2018-01-11 NOTE — Anesthesia Preprocedure Evaluation (Addendum)
Anesthesia Evaluation  Patient identified by MRN, date of birth, ID band Patient awake    Reviewed: Allergy & Precautions, NPO status , Patient's Chart, lab work & pertinent test results, reviewed documented beta blocker date and time   History of Anesthesia Complications Negative for: history of anesthetic complications  Airway Mallampati: II  TM Distance: >3 FB Neck ROM: Full    Dental  (+) Edentulous Upper, Edentulous Lower, Dental Advisory Given   Pulmonary former smoker,    breath sounds clear to auscultation       Cardiovascular hypertension, Pt. on medications and Pt. on home beta blockers + CAD, + Past MI, + Cardiac Stents and + Peripheral Vascular Disease  + dysrhythmias Atrial Fibrillation  Rhythm:Regular     Neuro/Psych ? Syncope today, no witness    GI/Hepatic GERD  ,  Endo/Other  diabetes, Type 2  Renal/GU Renal disease     Musculoskeletal  (+) Arthritis ,   Abdominal   Peds  Hematology Coumadin for afib   Anesthesia Other Findings MI 1996, stent 1996, PVD with RLE bypass, xlap scar for unknown surgery, [facility] not [facility], patient poor historian  Reproductive/Obstetrics                            Anesthesia Physical  Anesthesia Plan  ASA: IV  Anesthesia Plan: General   Post-op Pain Management:    Induction: Intravenous  PONV Risk Score and Plan: 2 and Ondansetron and Dexamethasone  Airway Management Planned: Oral ETT  Additional Equipment: None  Intra-op Plan:   Post-operative Plan: Extubation in OR  Informed Consent: I have reviewed the patients History and Physical, chart, labs and discussed the procedure including the risks, benefits and alternatives for the proposed anesthesia with the patient or authorized representative who has indicated his/her understanding and acceptance.   Dental advisory given  Plan Discussed with: CRNA  Anesthesia Plan  Comments:         Anesthesia Quick Evaluation

## 2018-01-11 NOTE — Anesthesia Postprocedure Evaluation (Signed)
Anesthesia Post Note  Patient: Donald Gordon  Procedure(s) Performed: FASCIOTOMY CLOSURE RIGHT CALF (Right Leg Lower)     Patient location during evaluation: PACU Anesthesia Type: General Level of consciousness: sedated and patient cooperative Pain management: pain level controlled Vital Signs Assessment: post-procedure vital signs reviewed and stable Respiratory status: spontaneous breathing Cardiovascular status: stable Anesthetic complications: no    Last Vitals:  Vitals:   01/11/18 1200 01/11/18 1300  BP: (!) 116/99 118/89  Pulse: 70 74  Resp: 18 20  Temp:    SpO2: 91% (!) 84%    Last Pain:  Vitals:   01/11/18 1254  TempSrc:   PainSc: 0-No pain                 Nolon Nations

## 2018-01-11 NOTE — Progress Notes (Addendum)
[place] for Heparin (apixban on hold) Indication: atrial fibrillation and right leg ischemia s/p embolectomy   Allergies  Allergen Reactions  . Statins Other (See Comments)    Muscle and Bone pain    Patient Measurements: Height: 5\' 9"  (175.3 cm) Weight: 187 lb 9.8 oz (85.1 kg) IBW/kg (Calculated) : 70.7  Vital Signs: Temp: 97.9 F (36.6 C) (07/22 1120) Temp Source: Oral (07/22 0452) BP: 123/57 (07/22 1120) Pulse Rate: 63 (07/22 1120)  Labs: Recent Labs    01/09/18 1600 01/10/18 0028 01/10/18 1614 01/11/18 0231 01/11/18 1133  HGB 13.7 11.9*  --  11.6*  --   HCT 41.5 36.5*  --  37.0*  --   PLT 261 153  --  217  --   APTT  --   --   --  59* 69*  LABPROT 15.9*  --   --   --   --   INR 1.28  --   --   --   --   HEPARINUNFRC  --   --  1.56* 1.16* 1.20*  CREATININE 1.93* 1.56*  --  1.44*  --     Estimated Creatinine Clearance: 46.5 mL/min (A) (by C-G formula based on SCr of 1.44 mg/dL (H)).   Medical History: Past Medical History:  Diagnosis Date  . Arthritis   . Cataract   . Chronic edema    a. Chronic RLE edema.  . Coronary artery disease    a. MI s/p balloon 1996, details unclear.  . Diabetes mellitus without complication ([place])    TYPE 2   . Dysrhythmia    PROXIMAL MARGIN FIBRILATION  . GERD (gastroesophageal reflux disease)   . Hyperlipidemia   . Hypertension   . Myocardial infarction ([place])   . PAD (peripheral artery disease) ([place])    a. s/p stenting 08/2012, 02/2013.   Marland Kitchen PAF (paroxysmal atrial fibrillation) ([place])   . Thrombosis of lower extremity    a. Listed on patient's medical bracelet - at [place].    Assessment: 77 y/o M on apixaban PTA for afib, pt presents to the ED with right leg ischemia, pt in now s/p OR for embolectomy/bypass/fasciotomy s/p closure. Currently using aPTT to dose heparin given apixaban influence on anti-Xa levels.  APTT is now low therapeutic at 69. H/H low stable. Plt wnl. RN reports no  s/s of bleeding   Goal of Therapy:  Heparin level 0.3-0.7 units/ml aPTT 66-102 seconds Monitor platelets by anticoagulation protocol: Yes   Plan:  -Inc heparin drip slightly to 1000 units/hr -2100 confirmatory HL -Daily CBC/HL/aPTT -Monitor for bleeding  Albertina Parr, PharmD., BCPS Clinical Pharmacist Clinical phone for 01/11/18 until 3:30pm: [phone] If after 3:30pm, please refer to [facility] for unit-specific pharmacist   Addendum: Now resuming home apixaban. SCr 1.44. Stop IV heparin and start apixaban 5 mg twice daily. Monitor renal function and for s/s of bleeding  Albertina Parr, PharmD., BCPS Clinical Pharmacist

## 2018-01-11 NOTE — Progress Notes (Signed)
[place] for Heparin (apixban on hold) Indication: atrial fibrillation and right leg ischemia s/p embolectomy   Allergies  Allergen Reactions  . Statins Other (See Comments)    Muscle and Bone pain    Patient Measurements: Height: 5\' 9"  (175.3 cm) Weight: 180 lb (81.6 kg) IBW/kg (Calculated) : 70.7  Vital Signs: Temp: 99.8 F (37.7 C) (07/21 2343) Temp Source: Oral (07/21 2343) BP: 134/65 (07/21 2343) Pulse Rate: 80 (07/21 2300)  Labs: Recent Labs    01/09/18 1600 01/10/18 0028 01/10/18 1614 01/11/18 0231  HGB 13.7 11.9*  --  11.6*  HCT 41.5 36.5*  --  37.0*  PLT 261 153  --  217  APTT  --   --   --  59*  LABPROT 15.9*  --   --   --   INR 1.28  --   --   --   HEPARINUNFRC  --   --  1.56* 1.16*  CREATININE 1.93* 1.56*  --  1.44*    Estimated Creatinine Clearance: 43 mL/min (A) (by C-G formula based on SCr of 1.44 mg/dL (H)).   Medical History: Past Medical History:  Diagnosis Date  . Arthritis   . Cataract   . Chronic edema    a. Chronic RLE edema.  . Coronary artery disease    a. MI s/p balloon 1996, details unclear.  . Diabetes mellitus without complication ([place])    TYPE 2   . Dysrhythmia    PROXIMAL MARGIN FIBRILATION  . GERD (gastroesophageal reflux disease)   . Hyperlipidemia   . Hypertension   . Myocardial infarction ([place])   . PAD (peripheral artery disease) ([place])    a. s/p stenting 08/2012, 02/2013.   Marland Kitchen PAF (paroxysmal atrial fibrillation) ([place])   . Thrombosis of lower extremity    a. Listed on patient's medical bracelet - at [place].    Assessment: 77 y/o M on apixaban PTA for afib, pt presents to the ED with right leg ischemia, pt in now s/p OR for embolectomy/bypass/fasciotomy, currently using aPTT to dose heparin given apixaban influence on anti-Xa levels, aPTT is low at 59  Goal of Therapy:  Heparin level 0.3-0.7 units/ml Monitor platelets by anticoagulation protocol: Yes   Plan:  -Inc heparin drip  to 950 units/hr -1200 aPTT/HL -Daily CBC/HL/aPTT -Monitor for bleeding  Narda Bonds 01/11/2018,3:25 AM

## 2018-01-11 NOTE — Progress Notes (Signed)
*  PRELIMINARY RESULTS* Vascular Ultrasound Carotid Duplex (Doppler) has been completed.   Findings suggest 1-39% internal carotid artery stenosis bilaterally. Vertebral arteries are patent with antegrade flow.  01/11/2018 4:46 PM Maudry Mayhew, BS, RVT, RDCS, RDMS

## 2018-01-11 NOTE — Progress Notes (Signed)
PT Cancellation Note  Patient Details Name: Donald Gordon MRN: [id number] DOB: [birth year]   Cancelled Treatment:    Reason Eval/Treat Not Completed: Other (comment). Pt with surgery this AM and now just up with OT. Will re-attempt tomorrow.   [place] 01/11/2018, 4:00 PM  [place]

## 2018-01-12 ENCOUNTER — Inpatient Hospital Stay (HOSPITAL_COMMUNITY): Payer: Medicare Other

## 2018-01-12 ENCOUNTER — Encounter (HOSPITAL_COMMUNITY): Payer: Self-pay | Admitting: Vascular Surgery

## 2018-01-12 DIAGNOSIS — R55 Syncope and collapse: Secondary | ICD-10-CM

## 2018-01-12 DIAGNOSIS — I5030 Unspecified diastolic (congestive) heart failure: Secondary | ICD-10-CM

## 2018-01-12 LAB — CBC
HEMATOCRIT: 31.2 % — AB (ref 39.0–52.0)
Hemoglobin: 10 g/dL — ABNORMAL LOW (ref 13.0–17.0)
MCH: 30.7 pg (ref 26.0–34.0)
MCHC: 32.1 g/dL (ref 30.0–36.0)
MCV: 95.7 fL (ref 78.0–100.0)
PLATELETS: 178 10*3/uL (ref 150–400)
RBC: 3.26 MIL/uL — ABNORMAL LOW (ref 4.22–5.81)
RDW: 13.6 % (ref 11.5–15.5)
WBC: 16.3 10*3/uL — AB (ref 4.0–10.5)

## 2018-01-12 LAB — GLUCOSE, CAPILLARY
GLUCOSE-CAPILLARY: 135 mg/dL — AB (ref 70–99)
GLUCOSE-CAPILLARY: 131 mg/dL — AB (ref 70–99)
Glucose-Capillary: 130 mg/dL — ABNORMAL HIGH (ref 70–99)
Glucose-Capillary: 127 mg/dL — ABNORMAL HIGH (ref 70–99)

## 2018-01-12 LAB — ECHOCARDIOGRAM COMPLETE
HEIGHTINCHES: 69 in
WEIGHTICAEL: 3001.78 [oz_av]

## 2018-01-12 NOTE — Progress Notes (Signed)
PT Cancellation Note  Patient Details Name: Donald Gordon MRN: [id number] DOB: December 24, 1940   Cancelled Treatment:    Reason Eval/Treat Not Completed: Patient at procedure or test/unavailable(Pt in vascular lab.  Will return as able.)   [name] 01/12/2018, 9:15 AM [name] Acute Rehabilitation [phone] (pager)

## 2018-01-12 NOTE — Plan of Care (Signed)
  Problem: Elimination: Goal: Will not experience complications related to bowel motility Outcome: Completed/Met   Problem: Elimination: Goal: Will not experience complications related to urinary retention Outcome: Completed/Met   Problem: Pain Managment: Goal: General experience of comfort will improve Outcome: Completed/Met   Problem: Clinical Measurements: Goal: Ability to maintain clinical measurements within normal limits will improve Outcome: Progressing   Problem: Clinical Measurements: Goal: Will remain free from infection Outcome: Progressing   Problem: Clinical Measurements: Goal: Respiratory complications will improve Outcome: Progressing

## 2018-01-12 NOTE — Progress Notes (Signed)
  Echocardiogram 2D Echocardiogram has been performed.  Merrie Roof F 01/12/2018, 10:10 AM

## 2018-01-12 NOTE — Progress Notes (Signed)
VASCULAR LAB PRELIMINARY  ARTERIAL  ABI completed:    RIGHT    LEFT    PRESSURE WAVEFORM  PRESSURE WAVEFORM  BRACHIAL 129 Triphasic BRACHIAL 139 Truphasic  DP Unable to obtain due to surgical incisions triphasic DP 120 Triphasic  AT   AT    PT Unable to obtain due to surgical incisions Triphasic PT 138 Triphasic  PER   PER    GREAT TOE  NA GREAT TOE  NA    RIGHT LEFT  ABI  0.99     Marshayla Mitschke E Marchel Foote 01/12/2018, 9:35 AM

## 2018-01-12 NOTE — Progress Notes (Addendum)
  Progress Note    01/12/2018 7:36 AM 1 Day Post-Op  Subjective:  Soreness in R lower leg but patient believes ROM of R foot is much improved   Vitals:   01/12/18 0000 01/12/18 0413  BP: (!) 107/44 (!) 123/50  Pulse: 81 71  Resp: 16 16  Temp: 98.6 F (37 C) 98.2 F (36.8 C)  SpO2: 94% 94%   Physical Exam: Lungs:  Non labored Incisions:  R groin incision soft, without drainage; lower leg incisions with minimal sanguinous collection on dressings Extremities:  R calf soft; AROM R ankle; R DP and PT by doppler Abdomen:  Soft Neurologic: A&O  CBC    Component Value Date/Time   WBC 15.0 (H) 01/11/2018 0231   RBC 3.82 (L) 01/11/2018 0231   HGB 11.6 (L) 01/11/2018 0231   HCT 37.0 (L) 01/11/2018 0231   PLT 217 01/11/2018 0231   MCV 96.9 01/11/2018 0231   MCH 30.4 01/11/2018 0231   MCHC 31.4 01/11/2018 0231   RDW 13.8 01/11/2018 0231   LYMPHSABS 1.1 01/09/2018 1600   MONOABS 1.1 (H) 01/09/2018 1600   EOSABS 0.0 01/09/2018 1600   BASOSABS 0.0 01/09/2018 1600    BMET    Component Value Date/Time   NA 139 01/11/2018 0231   K 4.5 01/11/2018 0231   CL 102 01/11/2018 0231   CO2 26 01/11/2018 0231   GLUCOSE 131 (H) 01/11/2018 0231   BUN 16 01/11/2018 0231   CREATININE 1.44 (H) 01/11/2018 0231   CALCIUM 8.1 (L) 01/11/2018 0231   GFRNONAA 45 (L) 01/11/2018 0231   GFRAA 53 (L) 01/11/2018 0231    INR    Component Value Date/Time   INR 1.28 01/09/2018 1600     Intake/Output Summary (Last 24 hours) at 01/12/2018 0736 Last data filed at 01/12/2018 0003 Gross per 24 hour  Intake 1445.23 ml  Output 275 ml  Net 1170.23 ml     Assessment/Plan:  77 y.o. male is s/p RLE thrombectomy with R fem - BK pop bypass with PTFE and 4 compartment fasciotomy with subsequent closure of fasciotomies 1 Day Post-Op   Eliquis per pharmacy; plavix restarted Carotid duplex shows B ICA 1-39% Echo pending PT/OT to eval and treat today Possible d/c tomorrow if embolic workup complete and  mobility improved   Dagoberto Ligas, PA-C [facility] [phone] 01/12/2018 7:36 AM   Addendum  I have independently interviewed and examined the patient, and I agree with the physician assistant's findings.  PT/OT.  Likely some PT needs as pt has some minimal foot drop which is greatly improved from his paralysis preop.  Finish syncope work-up: TTE.  I don't think Holter or loop recorder is going to add anything to his care, as this patient flips in and out of afib.  - Will need to see his PCP in the [place] after recovering for rx optimization   Adele Barthel, MD, FACS [facility] [place] Office: [phone] Pager: [phone]  01/12/2018, 8:56 AM

## 2018-01-12 NOTE — Progress Notes (Signed)
OT Cancellation Note  Patient Details Name: Donald Gordon MRN: [id number] DOB: June 22, 1941   Cancelled Treatment:    Reason Eval/Treat Not Completed: Patient declined, no reason specified (Pt reporting he is tired from PT session earlier and feeling nauseous; RN notified. Pt requesting OT return later today. Will return as schedule allows. Thank you.)  [place], OTR/L Acute Rehab Pager: [phone] Office: [phone] 01/12/2018, 11:09 AM

## 2018-01-12 NOTE — Progress Notes (Signed)
Occupational Therapy Treatment Patient Details Name: Donald Gordon MRN: [id number] DOB: May 04, 1941 Today's Date: 01/12/2018    History of present illness 77 y.o. male who presenting to ED after passing out and woke with numbness and weakness of RLE. Pt reporting he had prior bypass and stenting in RLE completed at [place]. 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 PMH including cataract, DM type 2, GERD, HTN, MI, PAD, PAF, and thrombosis of LEs.   OT comments  Pt progressing towards established OT goals. Pt performing toileting, hand hygiene, and LB dressing with Min guard A for safety and balance. Providing education on compensatory techniques for LB dressing and pt demonstrated understanding by donning underwear with Min guard A. Continue to recommend dc home once medically stable per physican. Will continue to follow acutely as admitted.    Follow Up Recommendations  No OT follow up;Supervision/Assistance - 24 hour    Equipment Recommendations  3 in 1 bedside commode    Recommendations for Other Services PT consult    Precautions / Restrictions Precautions Precautions: Fall Restrictions Weight Bearing Restrictions: No       Mobility Bed Mobility Overal bed mobility: Needs Assistance Bed Mobility: Supine to Sit     Supine to sit: Min guard     General bed mobility comments: Min Guard A for safety  Transfers Overall transfer level: Needs assistance Equipment used: Rolling walker (2 wheeled) Transfers: Sit to/from [facility] Sit to Stand: Min guard         General transfer comment: Min Guard A for safety    Balance Overall balance assessment: Needs assistance Sitting-balance support: No upper extremity supported;Feet supported Sitting balance-Leahy Scale: Fair     Standing balance support: Bilateral upper extremity supported;During functional activity Standing  balance-Leahy Scale: Poor Standing balance comment: Reliant on UE support                           ADL either performed or assessed with clinical judgement   ADL Overall ADL's : Needs assistance/impaired     Grooming: Set up;Sitting Grooming Details (indicate cue type and reason): Min Guard A for safety at sink. Pt with single LOB and requirign to reach out for UE support o correct balance and prevent fall.              Lower Body Dressing: Min guard;Sit to/from stand Lower Body Dressing Details (indicate cue type and reason): Education pt on compensatory techniques for LB dressing. Pt donning underwear with Min guard A for safety in standing Toilet Transfer: Min guard;RW;Ambulation;Regular Toilet;Grab bars Toilet Transfer Details (indicate cue type and reason): Min Guard A for safety Toileting- Clothing Manipulation and Hygiene: Supervision/safety;Sitting/lateral lean       Functional mobility during ADLs: Min guard;Rolling walker(stand pivot only) General ADL Comments: Pt presenting with decreased safety awareness as pt stating he is able to perform ADLs alone but demosntrating poor balance and strength     Vision       Perception     Praxis      Cognition Arousal/Alertness: Awake/alert Behavior During Therapy: WFL for tasks assessed/performed Overall Cognitive Status: Within Functional Limits for tasks assessed  Exercises     Shoulder Instructions       General Comments VSS    Pertinent Vitals/ Pain       Pain Assessment: Faces Faces Pain Scale: Hurts little more Pain Location: RLE Pain Descriptors / Indicators: Constant;Discomfort;Grimacing Pain Intervention(s): Monitored during session;Limited activity within patient's tolerance;Repositioned  Home Living                                          Prior Functioning/Environment              Frequency  Min  3X/week        Progress Toward Goals  OT Goals(current goals can now be found in the care plan section)  Progress towards OT goals: Progressing toward goals  Acute Rehab OT Goals Patient Stated Goal: "get back to my dog and cat." OT Goal Formulation: With patient Time For Goal Achievement: 01/25/18 Potential to Achieve Goals: Good  Plan Discharge plan remains appropriate    Co-evaluation                 AM-PAC PT "6 Clicks" Daily Activity     Outcome Measure   Help from another person eating meals?: None Help from another person taking care of personal grooming?: None Help from another person toileting, which includes using toliet, bedpan, or urinal?: A Little Help from another person bathing (including washing, rinsing, drying)?: A Little Help from another person to put on and taking off regular upper body clothing?: None Help from another person to put on and taking off regular lower body clothing?: A Little 6 Click Score: 21    End of Session Equipment Utilized During Treatment: Gait belt;Rolling walker  OT Visit Diagnosis: Unsteadiness on feet (R26.81);Other abnormalities of gait and mobility (R26.89);Muscle weakness (generalized) (M62.81);Pain Pain - Right/Left: Right Pain - part of body: Leg   Activity Tolerance Patient tolerated treatment well   Patient Left in chair;with call bell/phone within reach   Nurse Communication Mobility status        Time: 8466-5993 OT Time Calculation (min): 16 min  Charges: OT General Charges $OT Visit: 1 Visit OT Treatments $Self Care/Home Management : 8-22 mins  [place], OTR/L Acute Rehab Pager: [phone] Office: [place] 01/12/2018, 5:02 PM

## 2018-01-12 NOTE — Evaluation (Signed)
Physical Therapy Evaluation Patient Details Name: Donald Gordon MRN: [id number] DOB: [birth year] Today's Date: 01/12/2018   History of Present Illness  77 y.o. male who presenting to ED after passing out and woke with numbness and weakness of RLE. Pt reporting he had prior bypass and stenting in RLE completed at [place]. 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 PMH including cataract, DM type 2, GERD, HTN, MI, PAD, PAF, and thrombosis of LEs.  Clinical Impression  Pt admitted with above diagnosis. Pt currently with functional limitations due to the deficits listed below (see PT Problem List). Pt was able to ambulate with RW with overall good safety.  Will need a RW for home as feel that rollator may not be supportive enough for pt.  Pt agrees.  HH safety eval recommended to work on techniques with pt.  Pt will benefit from skilled PT to increase their independence and safety with mobility to allow discharge to the venue listed below.      Follow Up Recommendations Home health PT;Supervision/Assistance - 24 hour    Equipment Recommendations  Rolling walker with 5" wheels    Recommendations for Other Services       Precautions / Restrictions Precautions Precautions: Fall Restrictions Weight Bearing Restrictions: No      Mobility  Bed Mobility Overal bed mobility: Needs Assistance Bed Mobility: Supine to Sit     Supine to sit: Min guard     General bed mobility comments: did not need assist  for managing RLE  Transfers Overall transfer level: Needs assistance Equipment used: Rolling walker (2 wheeled) Transfers: Sit to/from Stand Sit to Stand: Min guard         General transfer comment: Min Guard A for safety. cues for hand placement.  Pt using EOB for stability with BLEs until achieving standing  Ambulation/Gait Ambulation/Gait assistance: Min guard Gait Distance (Feet): 18 Feet Assistive  device: Rolling walker (2 wheeled) Gait Pattern/deviations: Step-to pattern;Decreased stance time - right;Decreased weight shift to right;Decreased stride length;Antalgic   Gait velocity interpretation: <1.31 ft/sec, indicative of household ambulator General Gait Details: Pt needed cues to sequence steps and RW.  Pt with good safety with RW.  did need cues upon coming back to bed as pt sat abruptly and needed cues and assist to reach back for bed and control descent into bed.    Stairs            Wheelchair Mobility    Modified Rankin (Stroke Patients Only)       Balance Overall balance assessment: Needs assistance Sitting-balance support: No upper extremity supported;Feet supported Sitting balance-Leahy Scale: Fair     Standing balance support: Bilateral upper extremity supported;During functional activity Standing balance-Leahy Scale: Poor Standing balance comment: Reliant on UE support                             Pertinent Vitals/Pain Pain Assessment: Faces Faces Pain Scale: Hurts little more Pain Location: RLE Pain Descriptors / Indicators: Constant;Discomfort;Grimacing Pain Intervention(s): Limited activity within patient's tolerance;Monitored during session;Repositioned  VSS  Home Living Family/patient expects to be discharged to:: Private residence Living Arrangements: Alone Available Help at Discharge: Friend(s);Available PRN/intermittently Type of Home: Mobile home Home Access: Stairs to enter Entrance Stairs-Rails: Can reach both Entrance Stairs-Number of Steps: 3 Home Layout: One level Home Equipment: Walker - 4 wheels      Prior Function Level of  Independence: Independent         Comments: ADLs, IADLs, driving taking care of his dog and cat.     Hand Dominance   Dominant Hand: Right    Extremity/Trunk Assessment   Upper Extremity Assessment Upper Extremity Assessment: Defer to OT evaluation    Lower Extremity  Assessment Lower Extremity Assessment: RLE deficits/detail RLE Deficits / Details: grossly 3-/5 RLE: Unable to fully assess due to pain    Cervical / Trunk Assessment Cervical / Trunk Assessment: Normal  Communication   Communication: No difficulties  Cognition Arousal/Alertness: Awake/alert Behavior During Therapy: WFL for tasks assessed/performed Overall Cognitive Status: Within Functional Limits for tasks assessed                                        General Comments General comments (skin integrity, edema, etc.): O2 92-94% on RA throughout.  Replaced 2L O2 at end of treatment as it was on on arrival.     Exercises     Assessment/Plan    PT Assessment Patient needs continued PT services  PT Problem List Decreased strength;Decreased balance;Decreased activity tolerance;Decreased mobility;Decreased knowledge of use of DME;Decreased safety awareness;Decreased knowledge of precautions;Pain       PT Treatment Interventions DME instruction;Gait training;Functional mobility training;Stair training;Therapeutic activities;Therapeutic exercise;Balance training;Patient/family education    PT Goals (Current goals can be found in the Care Plan section)  Acute Rehab PT Goals Patient Stated Goal: "get back to my dog and cat." PT Goal Formulation: With patient Time For Goal Achievement: 01/26/18 Potential to Achieve Goals: Good    Frequency Min 3X/week   Barriers to discharge        Co-evaluation               AM-PAC PT "6 Clicks" Daily Activity  Outcome Measure Difficulty turning over in bed (including adjusting bedclothes, sheets and blankets)?: None Difficulty moving from lying on back to sitting on the side of the bed? : None Difficulty sitting down on and standing up from a chair with arms (e.g., wheelchair, bedside commode, etc,.)?: A Lot Help needed moving to and from a bed to chair (including a wheelchair)?: A Lot Help needed walking in hospital  room?: A Little Help needed climbing 3-5 steps with a railing? : Total 6 Click Score: 16    End of Session Equipment Utilized During Treatment: Gait belt;Oxygen Activity Tolerance: Patient limited by fatigue;Patient limited by pain Patient left: in bed;with call bell/phone within reach;with bed alarm set Nurse Communication: Mobility status PT Visit Diagnosis: Unsteadiness on feet (R26.81);Muscle weakness (generalized) (M62.81);Pain Pain - Right/Left: Right Pain - part of body: Leg    Time: 1011-1024 PT Time Calculation (min) (ACUTE ONLY): 13 min   Charges:   PT Evaluation $PT Eval Moderate Complexity: 1 Mod     PT G Codes:        Blakeley Scheier,PT Acute Rehabilitation [phone] (pager)   [name] 01/12/2018, 11:14 AM

## 2018-01-13 LAB — CBC
HCT: 28.5 % — ABNORMAL LOW (ref 39.0–52.0)
Hemoglobin: 9.1 g/dL — ABNORMAL LOW (ref 13.0–17.0)
MCH: 30.3 pg (ref 26.0–34.0)
MCHC: 31.9 g/dL (ref 30.0–36.0)
MCV: 95 fL (ref 78.0–100.0)
Platelets: 178 10*3/uL (ref 150–400)
RBC: 3 MIL/uL — ABNORMAL LOW (ref 4.22–5.81)
RDW: 13.7 % (ref 11.5–15.5)
WBC: 14.2 10*3/uL — ABNORMAL HIGH (ref 4.0–10.5)

## 2018-01-13 LAB — GLUCOSE, CAPILLARY
GLUCOSE-CAPILLARY: 108 mg/dL — AB (ref 70–99)
Glucose-Capillary: 120 mg/dL — ABNORMAL HIGH (ref 70–99)
Glucose-Capillary: 105 mg/dL — ABNORMAL HIGH (ref 70–99)
Glucose-Capillary: 104 mg/dL — ABNORMAL HIGH (ref 70–99)

## 2018-01-13 NOTE — Plan of Care (Signed)
Care plans reviewed and patient is progressing.  

## 2018-01-13 NOTE — Progress Notes (Addendum)
  Progress Note    01/13/2018 7:20 AM 2 Days Post-Op  Subjective:  Nauseous this morning   Vitals:   01/13/18 0018 01/13/18 0422  BP: (!) 122/57 (!) 114/58  Pulse:  (!) 48  Resp: (!) 21 18  Temp: 98.4 F (36.9 C) 97.7 F (36.5 C)  SpO2: 92% 95%   Physical Exam: Cardiac:  RRR Lungs:  Non labored on RA Incisions:  R groin incision c/d/i; lower leg incisions intact without drainage; pitting edema to level of knee RLE; no calf pain; R ankle ROM stable; palpable R ATA Abdomen:  Soft Neurologic: A&O  CBC    Component Value Date/Time   WBC 14.2 (H) 01/13/2018 0334   RBC 3.00 (L) 01/13/2018 0334   HGB 9.1 (L) 01/13/2018 0334   HCT 28.5 (L) 01/13/2018 0334   PLT 178 01/13/2018 0334   MCV 95.0 01/13/2018 0334   MCH 30.3 01/13/2018 0334   MCHC 31.9 01/13/2018 0334   RDW 13.7 01/13/2018 0334   LYMPHSABS 1.1 01/09/2018 1600   MONOABS 1.1 (H) 01/09/2018 1600   EOSABS 0.0 01/09/2018 1600   BASOSABS 0.0 01/09/2018 1600    BMET    Component Value Date/Time   NA 139 01/11/2018 0231   K 4.5 01/11/2018 0231   CL 102 01/11/2018 0231   CO2 26 01/11/2018 0231   GLUCOSE 131 (H) 01/11/2018 0231   BUN 16 01/11/2018 0231   CREATININE 1.44 (H) 01/11/2018 0231   CALCIUM 8.1 (L) 01/11/2018 0231   GFRNONAA 45 (L) 01/11/2018 0231   GFRAA 53 (L) 01/11/2018 0231    INR    Component Value Date/Time   INR 1.28 01/09/2018 1600     Intake/Output Summary (Last 24 hours) at 01/13/2018 0720 Last data filed at 01/12/2018 2000 Gross per 24 hour  Intake 240 ml  Output 125 ml  Net 115 ml     Assessment/Plan:  77 y.o. male is s/p RLE thrombectomy with R fem - BK pop bypass with PTFE and 4 compartment fasciotomy with subsequent closure of fasciotomies   2 Days Post-Op   Patent bypass with palpable ATA pulse Echo negative; etiology of syncope unknown; pt to follow up with PCP after d/c Eliquis per pharmacy; continue plavix PT recommended HH; case management consulted D/c this  afternoon if nausea improved and HH arranged   Dagoberto Ligas, PA-C [facility] [phone] 01/13/2018 7:20 AM  Addendum  I have independently interviewed and examined the patient, and I agree with the physician assistant's findings.  ABI demonstrate triphasic flow in tibial arteries.  [place] with d/c home if home needs arranged.  Pt can follow up in [place] for further work-up of syncope.  Staples out in 2 weeks.  F/U at that time  Adele Barthel, MD, [facility] [place] Office: [phone] Pager: [phone]  01/13/2018, 8:13 AM

## 2018-01-13 NOTE — Progress Notes (Signed)
PT Cancellation Note  Patient Details Name: LORANZO DESHA MRN: [id number] DOB: 1941-02-17   Cancelled Treatment:    Reason Eval/Treat Not Completed: Other (comment). Declining mobility secondary to nausea despite max encouragement especially if pt to d/c home today. "I just need to lay here and let this pass." Will follow-up for PT treatment as schedule permits.  Mabeline Caras, PT, DPT Acute Rehab Services  Pager: [phone] 01/13/2018, 8:27 AM

## 2018-01-13 NOTE — Discharge Instructions (Signed)

## 2018-01-13 NOTE — Care Management Note (Signed)
Case Management Note Marvetta Gibbons RN, BSN Unit 4E-Case Manager [phone]  Patient Details  Name: Donald Gordon MRN: [id number] Date of Birth: [birth year]  Subjective/Objective: Pt admitted s/p RLE thrombectomy with R fem - BK pop bypass with PTFE and 4 compartment fasciotomy with subsequent closure of fasciotomies            Action/Plan: PTA pt lived at home alone, orders placed for [facility] and DME needs- spoke with pt at bedside per pt he does not want 3n1 at this time and declines DME. - choice offered for [facility] agency- per pt he has used HH in past- thinks it may have been [facility]- wants to use them this time. Pt goes to the [facility] for primary care- PCP is Sin[place], CSW there is Percell Locus- contact # is [phone] ext. [phone]. Per pt he gets is meds via mail order from the [place]- was on Eliquis PTA. - Referral called to [place] with [facility] for HHPT needs- pt states he plans to go stay with his girlfriend Lavina Hamman in [place]- address is [address], [place] [address] phone# is [phone]  Expected Discharge Date:   01/13/18               Expected Discharge Plan:  [place]  In-House Referral:  NA  Discharge planning Services  CM Consult  Post Acute Care Choice:  Durable Medical Equipment, Home Health Choice offered to:  Patient  DME Arranged:  3-N-1, Patient refused services DME Agency:  NA  HH Arranged:  PT [place] Agency:  [facility] (formerly [employer])  Status of Service:  Completed, signed off  If discussed at [employer] of Stay Meetings, dates discussed:    Discharge Disposition: home/home health   Additional Comments:  Dawayne Patricia, RN 01/13/2018, 2:24 PM

## 2018-01-14 ENCOUNTER — Telehealth: Payer: Self-pay | Admitting: Vascular Surgery

## 2018-01-14 DIAGNOSIS — I998 Other disorder of circulatory system: Principal | ICD-10-CM

## 2018-01-14 LAB — CBC
HEMATOCRIT: 30.7 % — AB (ref 39.0–52.0)
Hemoglobin: 9.9 g/dL — ABNORMAL LOW (ref 13.0–17.0)
MCH: 30.4 pg (ref 26.0–34.0)
MCHC: 32.2 g/dL (ref 30.0–36.0)
MCV: 94.2 fL (ref 78.0–100.0)
PLATELETS: 204 10*3/uL (ref 150–400)
RBC: 3.26 MIL/uL — ABNORMAL LOW (ref 4.22–5.81)
RDW: 13.6 % (ref 11.5–15.5)
WBC: 9.2 10*3/uL (ref 4.0–10.5)

## 2018-01-14 LAB — GLUCOSE, CAPILLARY
GLUCOSE-CAPILLARY: 109 mg/dL — AB (ref 70–99)
Glucose-Capillary: 90 mg/dL (ref 70–99)

## 2018-01-14 MED ORDER — OXYCODONE HCL 5 MG PO TABS: 5.0000 mg | ORAL_TABLET | Freq: Four times a day (QID) | ORAL | 0 refills | Status: DC | PRN

## 2018-01-14 MED ORDER — DILTIAZEM HCL 60 MG PO TABS
60.0000 mg | ORAL_TABLET | Freq: Once | ORAL | Status: AC
  Administered 2018-01-14: 60 mg via ORAL
  Filled 2018-01-14: qty 1

## 2018-01-14 MED ORDER — CLOPIDOGREL BISULFATE 75 MG PO TABS: 75.0000 mg | ORAL_TABLET | Freq: Every day | ORAL | 1 refills | Status: DC

## 2018-01-14 NOTE — Telephone Encounter (Signed)
sch appt phone NA mld ltr 02/01/18 1130am p/o MD

## 2018-01-14 NOTE — Progress Notes (Signed)
Physical Therapy Treatment Patient Details Name: Donald Gordon MRN: [id number] DOB: September 28, 1940 Today's Date: 01/14/2018    History of Present Illness 77 y.o. male who presenting to ED after passing out and woke with numbness and weakness of RLE. Pt reporting he had prior bypass and stenting in RLE completed at [place]. 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 PMH including cataract, DM type 2, GERD, HTN, MI, PAD, PAF, and thrombosis of LEs.    PT Comments    Patient demonstrating progress this visit, able to focus on stair training. Ambulates short distances without AD, with mild unsteady noted, safer with RW at this time. Pt with safety on stairs and able to perform with supervision after cueing for sequencing. Agree with HHPT recs, no concerns for safe return home with supervision once medically stable. VSS on RA.      Follow Up Recommendations  Home health PT;Supervision/Assistance - 24 hour     Equipment Recommendations  Rolling walker with 5" wheels    Recommendations for Other Services       Precautions / Restrictions Precautions Precautions: Fall Restrictions Weight Bearing Restrictions: No    Mobility  Bed Mobility Overal bed mobility: Needs Assistance Bed Mobility: Supine to Sit     Supine to sit: Min guard     General bed mobility comments: Min Guard A for safety  Transfers Overall transfer level: Needs assistance Equipment used: Rolling walker (2 wheeled) Transfers: Sit to/from [facility] Sit to Stand: Supervision Stand pivot transfers: Supervision          Ambulation/Gait Ambulation/Gait assistance: Supervision Gait Distance (Feet): 120 Feet Assistive device: Rolling walker (2 wheeled) Gait Pattern/deviations: Step-to pattern;Decreased stance time - right;Decreased weight shift to right;Decreased stride length;Antalgic Gait velocity: decreased    General Gait Details: Pt ambulating without AD for short steps, unsteady safer with RW at this time. VSS   Stairs Stairs: Yes Stairs assistance: Min guard;Supervision Stair Management: One rail Left;One rail Right;Step to pattern Number of Stairs: 12 General stair comments: cues for sequencing to ensure safety, step to step pattern, pt able to demosntrate safety with BUE suport on rails and follow proper sequencing.    Wheelchair Mobility    Modified Rankin (Stroke Patients Only)       Balance Overall balance assessment: Needs assistance Sitting-balance support: No upper extremity supported;Feet supported Sitting balance-Leahy Scale: Fair     Standing balance support: Bilateral upper extremity supported;During functional activity Standing balance-Leahy Scale: Fair                              Cognition Arousal/Alertness: Awake/alert Behavior During Therapy: WFL for tasks assessed/performed Overall Cognitive Status: Within Functional Limits for tasks assessed                                        Exercises      General Comments General comments (skin integrity, edema, etc.): VSS      Pertinent Vitals/Pain Pain Assessment: 0-10 Pain Score: 4  Pain Location: RLE Pain Descriptors / Indicators: Constant;Discomfort;Grimacing Pain Intervention(s): Limited activity within patient's tolerance;Monitored during session;Premedicated before session    Home Living                      Prior Function  PT Goals (current goals can now be found in the care plan section) Acute Rehab PT Goals Patient Stated Goal: "get back to my dog and cat." PT Goal Formulation: With patient Time For Goal Achievement: 01/26/18 Potential to Achieve Goals: Good Progress towards PT goals: Progressing toward goals    Frequency    Min 3X/week      PT Plan Current plan remains appropriate    Co-evaluation              AM-PAC PT  "6 Clicks" Daily Activity  Outcome Measure  Difficulty turning over in bed (including adjusting bedclothes, sheets and blankets)?: None Difficulty moving from lying on back to sitting on the side of the bed? : None Difficulty sitting down on and standing up from a chair with arms (e.g., wheelchair, bedside commode, etc,.)?: A Lot Help needed moving to and from a bed to chair (including a wheelchair)?: A Lot Help needed walking in hospital room?: A Little Help needed climbing 3-5 steps with a railing? : Total 6 Click Score: 16    End of Session Equipment Utilized During Treatment: Gait belt Activity Tolerance: Patient tolerated treatment well Patient left: in chair;with call bell/phone within reach   PT Visit Diagnosis: Unsteadiness on feet (R26.81);Muscle weakness (generalized) (M62.81);Pain Pain - Right/Left: Right Pain - part of body: Leg     Time: 0815-0830 PT Time Calculation (min) (ACUTE ONLY): 15 min  Charges:  $Gait Training: 8-22 mins                    G Codes:       Reinaldo Berber, PT, DPT Acute Rehab Services Pager: [phone]     Reinaldo Berber 01/14/2018, 8:34 AM

## 2018-01-14 NOTE — Discharge Summary (Addendum)
Physician Discharge Summary   Patient ID: Donald Gordon [id number] 76 y.o. 12-03-1940  Admit date: 01/09/2018  Discharge date and time:  01/14/18  Admitting Physician: Conrad Vernon, MD   Discharge Physician: same  Admission Diagnoses: Critical lower limb ischemia [I99.8]  Discharge Diagnoses: same  Admission Condition: poor  Discharged Condition: fair  Indication for Admission: Acute limb ischemia right leg  Hospital Course: Donald Gordon is a 77 year old male who was brought to the emergency department with a likely thrombosed right femoral to popliteal bypass resulting in acute limb ischemia after experiencing a syncopal episode on 01/09/2018.  He was brought emergently to the OR by Dr. Bridgett Larsson for thrombectomy of right below the knee popliteal artery and tibial arteries as well as right femoral to below the knee popliteal artery bypass with PTFE and a 4 compartment fasciotomy.  He tolerated procedure well and was admitted to the stepdown unit postoperatively.  Cardiology was consulted postoperatively for a syncopal work-up.  Echocardiogram was negative.  Patient has a history of atrial fibrillation however had a low fibrillation burden during hospitalization.  Recommendations were to continue anticoagulation and to follow-up as an outpatient for patient to wear a cardiac monitor.  POD #1 patient was perfusing his right foot and had some return of sensation and motor function.  POD #2 he was taken back to the operating room for closure of medial and lateral fasciotomy.  Physical therapy and Occupational Therapy were consulted for evaluation and treatment.  Recommendations included home health physical therapy as well as 3 and 1 bedside commode.  Case management was consulted to arrange home health needs.  On exam today patient states he is ambulating with minimal assistance, tolerating a regular diet, and feeling fit for discharge home.  He will resume Eliquis and Plavix.  He will be prescribed 2 to  3 days of narcotic pain medication for continued postoperative pain control.  He will follow-up in office in 2 weeks for suture and staple removal and to evaluate circulation right lower extremity.  Discharge instructions were reviewed with the patient and he voices understanding.  He will be discharged home with home health this morning in stable condition.  It should also be noted that at the time of discharge patient has a palpable right anterior tibial artery pulse.  Consults: cardiology  Treatments: surgery by Dr. Bridgett Larsson 01/09/18 1. Redo exposure of right common femoral artery and below-the-knee popliteal artery  2. Thrombectomy of right anterior tibial artery, tibioperoneal trunk, peroneal artery, and below-the-knee popliteal artery  3. Bovine patch angiopalsty of right below-the-knee popliteal artery  4.   Right common femoral artery to below-the-knee popliteal artery bypass with Propaten 5.   Four compartment fasciotomy of right calf    Discharge Exam: See progress note 01/14/2018 Vitals:   01/13/18 2350 01/14/18 0512  BP: (!) 121/59 (!) 118/41  Pulse:    Resp:    Temp: 98.6 F (37 C) 98.8 F (37.1 C)  SpO2: 96%      Disposition: Discharge disposition: 01-Home or Self Care       - For [facility] Registry use ---  Post-op:  Wound infection: No  Graft infection: No  New Arrhythmia: No Ipsilateral amputation: [x ] no, [ ]  Minor, [ ]  BKA, [ ]  AKA Patency judged by: [ ]  Dopper only, [ ]  Palpable graft pulse, [x ] Palpable distal pulse, [ ]  ABI inc. > 0.15, [ ]  Duplex Discharge ABI: R unobtainable, L 0.99 D/C Ambulatory Status: Ambulatory with Assistance  Complications: MI: [x ] No, [ ]  Troponin only, [ ]  EKG or Clinical CHF: No Resp failure: [ x] none, [ ]  Pneumonia, [ ]  Ventilator Chg in renal function: [x ] none, [ ]  Inc. Cr > 0.5, [ ]  Temp. Dialysis, [ ]  Permanent dialysis Stroke: [x ] None, [ ]  Minor, [ ]  Major Return to OR: No  Reason for return to OR: [ ]  Bleeding, [  ] Infection, [ ]  Thrombosis, [ ]  Revision  Discharge medications: Statin use:  Yes ASA use:  Yes Plavix use:  Yes Beta blocker use: Yes Coumadin use: Yes, Eliquis    Patient Instructions:  Allergies as of 01/14/2018      Reactions   Statins Other (See Comments)   Muscle and Bone pain      Medication List    TAKE these medications   acetaminophen 500 MG tablet Commonly known as:  TYLENOL Take 500 mg by mouth 2 (two) times daily as needed (for pain or headaches).   aspirin EC 81 MG tablet Take 81 mg by mouth at bedtime.   clopidogrel 75 MG tablet Commonly known as:  PLAVIX Take 1 tablet (75 mg total) by mouth daily.   diltiazem 180 MG 24 hr capsule Commonly known as:  DILACOR XR Take 180 mg by mouth daily.   ELIQUIS 5 MG Tabs tablet Generic drug:  apixaban Take 5 mg by mouth 2 (two) times daily.   ezetimibe 10 MG tablet Commonly known as:  ZETIA Take 5 mg by mouth daily.   hydrochlorothiazide 12.5 MG capsule Commonly known as:  MICROZIDE Take 12.5 mg by mouth daily with breakfast.   memantine 10 MG tablet Commonly known as:  NAMENDA Take 10 mg by mouth 2 (two) times daily.   metoprolol tartrate 50 MG tablet Commonly known as:  LOPRESSOR Take 50 mg by mouth 2 (two) times daily.   multivitamin with minerals tablet Take 1 tablet by mouth daily.   nitroGLYCERIN 0.4 MG SL tablet Commonly known as:  NITROSTAT Place 0.4 mg under the tongue every 5 (five) minutes as needed for chest pain.   oxyCODONE 5 MG immediate release tablet Commonly known as:  Oxy IR/ROXICODONE Take 1 tablet (5 mg total) by mouth every 6 (six) hours as needed for moderate pain.   pantoprazole 40 MG tablet Commonly known as:  PROTONIX Take 40 mg by mouth daily.   ramipril 10 MG capsule Commonly known as:  ALTACE Take 10 mg by mouth 2 (two) times daily.   rivastigmine 6 MG capsule Commonly known as:  EXELON Take 6 mg by mouth 2 (two) times daily.   tamsulosin 0.4 MG Caps  capsule Commonly known as:  FLOMAX Take 0.4 mg by mouth at bedtime.   VOLTAREN 1 % Gel Generic drug:  diclofenac sodium Apply 2 g topically See admin instructions. Apply 2 grams to affected areas three times a day as directed            Durable Medical Equipment  (From admission, onward)        Start     Ordered   01/13/18 0709  For home use only DME 3 n 1  Once     01/13/18 0711     Activity: activity as tolerated Diet: regular diet Wound Care: keep wound clean and dry  Follow-up with Dr. Bridgett Larsson in 2 weeks.  Signed: Dagoberto Ligas 01/14/2018 8:49 AM   Addendum  Donald Gordon is a 77 y.o. (09-20-1940) male presented with threatened  right limb with likely 5-6 hours of acute limb ischemia.  The patient had a syncopal episode earlier the day.  Reportedly he discovered his leg symptoms upon waking.  The exact chronology was unknown.  I emergently took this patient to the operating room.  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  The patient underwent a left leg thrombectomy via a popliteal exposure.  Thrombus was retrieved from the entire anterior tibial artery and partially from the presumed posterior tibial artery.  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  I patched the distal below-the-knee popliteal artery with a bovine patch.  I explored the common femoral artery and no thrombus was noted here.  Both the popliteal and groin incision were redo exposure with dense scar tissue making the exposure difficult.  I elected to proceed with a right common femoral artery to below-the-knee popliteal artery artery bypass due to the limb threatened status of this right leg.  This was completed with Propaten to avoid additional ischemia time needed for vein harvest.  The greater saphenous vein at the calf also appeared to be thickened and likely not suitable for use.  After completion of the femoropopliteal bypass, I did  four compartment fasciotomies of this right calf.  I took care to cover the distal anastomosis with gastrocnemius muscle.  Immediately some superficial posterior compartment muscle swelling was noted.  I applied VAC dressing to both the medial and lateral fasciotomies.    In this patient's post-operative course, patient have recovery of motor and sensation.  On POD #3, I closed the medial and lateral fasciotomies in her right calf.  The rest of this patient's course consisted of advancing activity and further recovery of motor and sensation in the right foot.  ABI demonstrates triphasic flow in the right tibial arteries.  The patient will follow up with me in 2 weeks for staple remove and suture removal.   Adele Barthel, MD, [facility] [place]: [phone] Pager: [phone]  01/14/2018, 10:10 AM

## 2018-01-14 NOTE — Progress Notes (Addendum)
  Progress Note    01/14/2018 7:13 AM 3 Days Post-Op  Subjective:  Nausea resolved; pt anticipating discharge today   Vitals:   01/13/18 2350 01/14/18 0512  BP: (!) 121/59 (!) 118/41  Pulse:    Resp:    Temp: 98.6 F (37 C) 98.8 F (37.1 C)  SpO2: 96%    Physical Exam: Lungs:  Non labored Incisions:  R groin incision soft, without drainage; lower leg incisions c/d/i Extremities:  Palpable R ATA pulse Abdomen:  Soft Neurologic: A&O  CBC    Component Value Date/Time   WBC 9.2 01/14/2018 0246   RBC 3.26 (L) 01/14/2018 0246   HGB 9.9 (L) 01/14/2018 0246   HCT 30.7 (L) 01/14/2018 0246   PLT 204 01/14/2018 0246   MCV 94.2 01/14/2018 0246   MCH 30.4 01/14/2018 0246   MCHC 32.2 01/14/2018 0246   RDW 13.6 01/14/2018 0246   LYMPHSABS 1.1 01/09/2018 1600   MONOABS 1.1 (H) 01/09/2018 1600   EOSABS 0.0 01/09/2018 1600   BASOSABS 0.0 01/09/2018 1600    BMET    Component Value Date/Time   NA 139 01/11/2018 0231   K 4.5 01/11/2018 0231   CL 102 01/11/2018 0231   CO2 26 01/11/2018 0231   GLUCOSE 131 (H) 01/11/2018 0231   BUN 16 01/11/2018 0231   CREATININE 1.44 (H) 01/11/2018 0231   CALCIUM 8.1 (L) 01/11/2018 0231   GFRNONAA 45 (L) 01/11/2018 0231   GFRAA 53 (L) 01/11/2018 0231    INR    Component Value Date/Time   INR 1.28 01/09/2018 1600     Intake/Output Summary (Last 24 hours) at 01/14/2018 0713 Last data filed at 01/14/2018 0514 Gross per 24 hour  Intake 720 ml  Output 1026 ml  Net -306 ml     Assessment/Plan:  77 y.o. male is s/p RLE thrombectomy with R fem - BK pop bypass with PTFE and 4 compartment fasciotomy with subsequent closure of fasciotomies   3 Days Post-Op   Patent bypass with palpable R AT pulse HH arranged by case management D/c home today Follow up in office in 2 weeks   Dagoberto Ligas, PA-C [facility] [phone] 01/14/2018 7:13 AM   Addendum  I have independently interviewed and examined the patient,  and I agree with the physician assistant's findings.  F/U 2 weeks for suture and staple removal.   Adele Barthel, MD, [place] [facility] [place]: [phone] Pager: [phone]  01/14/2018, 7:24 AM

## 2018-01-14 NOTE — Care Management Important Message (Signed)
Important Message  Patient Details  Name: Donald Gordon MRN: [id number] Date of Birth: [birth year]   Medicare Important Message Given:  Yes    [name] P [name] 01/14/2018, 2:12 PM

## 2018-01-14 NOTE — Progress Notes (Signed)
   Notified by RN that patient is in atrial fibrillation with RVR this morning. HR persistently in the 120s-130s. Patient is without complaints and notes that this happens from time to time when he doesn't take his medications on time. He was discharged by Vascular Surgery this morning and his IV was already removed.   Plan: - Will given an extra dose of po diltiazem 60mg  now in an effort to slow him down. Could consider additional dosing if he remains in RVR - Continue eliquis for stroke prevention.  - He should follow-up at the [facility] for close monitoring  Roby Lofts, PA-C

## 2018-01-18 ENCOUNTER — Encounter (HOSPITAL_COMMUNITY): Payer: Self-pay | Admitting: Vascular Surgery

## 2018-01-18 NOTE — Anesthesia Postprocedure Evaluation (Signed)
Anesthesia Post Note  Patient: Donald Gordon  Procedure(s) Performed: THROMBECTOMY RIGHT LOWER LEG (Right Leg Lower) PATCH ANGIOPLASTY USING XENOSURE BIOLOGIC 1CM X 6CM PATCH (Right Leg Lower) RIGHT FEMORAL-POPLITEAL ARTERY BYPASS USING PROPATEN 6MM X 80CM VASCULAR GRAFT (Right Groin) FASCIOTOMY RIGHT LOWER LEG (Right Leg Lower) APPLICATION OF WOUND VAC ON RIGHT LOWER LEG (Right Leg Lower)     Patient location during evaluation: PACU Anesthesia Type: General Level of consciousness: awake and alert Pain management: pain level controlled Vital Signs Assessment: post-procedure vital signs reviewed and stable Respiratory status: spontaneous breathing, nonlabored ventilation, respiratory function stable and patient connected to nasal cannula oxygen Cardiovascular status: blood pressure returned to baseline and stable Postop Assessment: no apparent nausea or vomiting Anesthetic complications: no    Last Vitals:  Vitals:   01/14/18 0948 01/14/18 1200  BP:    Pulse:    Resp: (!) 21 20  Temp:    SpO2:      Last Pain:  Vitals:   01/14/18 0900  TempSrc:   PainSc: 0-No pain                 Chloey Ricard

## 2018-01-28 NOTE — Progress Notes (Deleted)
    Postoperative Visit   History of Present Illness   Donald Gordon is a 77 y.o. year old male who presents for postoperative follow-up for: TE R AT, TPT, peroneal and BK pop, BPA R BTK pop, R CFA to BK pop BPG w/ Propaten, calf fasciotomy followed by fasciotomy closure (01/09/18, 01/11/18).  The patient's wounds are *** healed.  The patient notes *** resolution of lower extremity symptoms.  The patient is *** able to complete their activities of daily living.  The patient's current symptoms are: ***.   For VQI Use Only   PRE-ADM LIVING: {VQI Pre-admission Living:20973}  AMB STATUS: {VQI Ambulatory Status:20974}   Physical Examination  ***There were no vitals filed for this visit.  RLE: Incisions are *** healed, wounds are *** healed, pedal pulses are ***   Medical Decision Making   Donald Gordon is a 77 y.o. year old male who presents s/p TE R AT, TPT, peroneal and BK pop, BPA R BTK pop, R CFA to BK pop BPG w/ Propaten, calf fasciotomy followed by fasciotomy closure   The patient's bypass incisions are *** healing appropriately with resolution of pre-operative symptoms. I discussed in depth with the patient the nature of atherosclerosis, and emphasized the importance of maximal medical management including strict control of blood pressure, blood glucose, and lipid levels, obtaining regular exercise, and cessation of smoking.  The patient is aware that without maximal medical management the underlying atherosclerotic disease process will progress, limiting the benefit of any interventions. The patient's surveillance will included ABI and bypass duplex studies which will be completed in: 3 months, at which time the patient will be re-evaluated.   I emphasized the importance of routine surveillance of the patient's bypass, as the vascular surgery literature emphasize the improved patency possible with assisted primary patency procedures versus secondary patency procedures. The patient  agrees to participate in their maximal medical care and routine surveillance. Thank you for allowing [place] to participate in this patient's care.   Adele Barthel, MD, FACS [facility] [place] Office: [phone] Pager: [phone]

## 2018-02-01 ENCOUNTER — Encounter: Payer: Self-pay | Admitting: Vascular Surgery

## 2018-02-01 ENCOUNTER — Encounter: Payer: Medicare Other | Admitting: Vascular Surgery

## 2018-02-01 ENCOUNTER — Other Ambulatory Visit: Payer: Self-pay

## 2018-02-01 ENCOUNTER — Ambulatory Visit (INDEPENDENT_AMBULATORY_CARE_PROVIDER_SITE_OTHER): Payer: Self-pay | Admitting: Vascular Surgery

## 2018-02-01 VITALS — BP 122/73 | HR 66 | Temp 96.7°F | Resp 16 | Ht 69.0 in | Wt 180.0 lb

## 2018-02-01 DIAGNOSIS — I743 Embolism and thrombosis of arteries of the lower extremities: Secondary | ICD-10-CM

## 2018-02-01 NOTE — Progress Notes (Signed)
    Postoperative Visit   History of Present Illness   KAIREE ISA is a 77 y.o. year old male who presents for postoperative follow-up for: post-op.  Prior procedures include: 1. TE R AT, TPT, peroneal, and BK pop, R calf fasciotomies (01/09/18) 2. Repair of fasciotomies (01/11/18)  The patient's wounds are not healed.  The patient notes resolution of lower extremity symptoms.  The patient is able to complete their activities of daily living.  The patient's current symptoms are: drainage R groin.  Pt has been pouring hydrogen peroxide on R groin wound.   For VQI Use Only   PRE-ADM LIVING: Home  AMB STATUS: Ambulatory   Physical Examination   Vitals:   02/01/18 0846 02/01/18 0853  BP: (!) 142/71 122/73  Pulse: 61 66  Resp: 16   Temp: (!) 96.7 F (35.9 C)   TempSrc: Oral   SpO2: 97%   Weight: 180 lb (81.6 kg)   Height: 5\' 9"  (1.753 m)     RLE:   R groin: superficial separation superiorly with some dry fat evident, rest of incision healed  R calf: Incisions are healed, staples and stitches in place, some irritation at staple site, swelling 1-2+  Faintly palpable DP  Medical Decision Making   BLAIKE VICKERS is a 77 y.o. year old male who presents s/p R leg TE, R fem-pop BPG w/ Propaten, 4-comp fasciotomies and closure.  Wet-to-dry dressing to R groin.  Will set up home health.  Suspect will take 1-2 months. Wound check in 2 months with Dr. Carlis Abbott, who is assuming my panel. Thank you for allowing [place] to participate in this patient's care.   Adele Barthel, MD, FACS [facility] [place] Office: [phone] Pager: [phone]

## 2018-02-04 ENCOUNTER — Telehealth: Payer: Self-pay | Admitting: *Deleted

## 2018-02-04 NOTE — Telephone Encounter (Signed)
Called patient to discuss home health needs.  Tiffany, from [facility] came by the office to inform me that since Donald Gordon is not homebound, [facility] could not extend home care.   Some supplies were left with the patient and instructions were given.  Patient expressed to me that he has spoken to the [place] and was waiting for a call back to see if they would pay for the care.  I explained to the patient if he would come by [facility] I would give him additional supplies and instruct him further on wound care and the importance of keeping the wound clean and dry.  Patient states that he understands and he will try to find someone to bring him by the office.

## 2018-03-16 DIAGNOSIS — I4891 Unspecified atrial fibrillation: Secondary | ICD-10-CM | POA: Diagnosis not present

## 2018-03-16 DIAGNOSIS — R079 Chest pain, unspecified: Secondary | ICD-10-CM | POA: Diagnosis not present

## 2018-03-16 DIAGNOSIS — I361 Nonrheumatic tricuspid (valve) insufficiency: Secondary | ICD-10-CM | POA: Diagnosis not present

## 2018-03-16 DIAGNOSIS — I200 Unstable angina: Secondary | ICD-10-CM

## 2018-03-17 ENCOUNTER — Inpatient Hospital Stay (HOSPITAL_COMMUNITY)
Admission: AD | Admit: 2018-03-17 | Discharge: 2018-03-19 | DRG: 282 | Disposition: A | Discharge status: Home / Self Care | Payer: Medicare Other | Source: Other Acute Inpatient Hospital | Attending: Cardiovascular Disease | Admitting: Cardiovascular Disease

## 2018-03-17 ENCOUNTER — Other Ambulatory Visit: Payer: Self-pay

## 2018-03-17 DIAGNOSIS — Z7982 Long term (current) use of aspirin: Secondary | ICD-10-CM | POA: Diagnosis not present

## 2018-03-17 DIAGNOSIS — I252 Old myocardial infarction: Secondary | ICD-10-CM

## 2018-03-17 DIAGNOSIS — J449 Chronic obstructive pulmonary disease, unspecified: Secondary | ICD-10-CM | POA: Diagnosis present

## 2018-03-17 DIAGNOSIS — E785 Hyperlipidemia, unspecified: Secondary | ICD-10-CM | POA: Diagnosis present

## 2018-03-17 DIAGNOSIS — I214 Non-ST elevation (NSTEMI) myocardial infarction: Secondary | ICD-10-CM | POA: Diagnosis present

## 2018-03-17 DIAGNOSIS — M1990 Unspecified osteoarthritis, unspecified site: Secondary | ICD-10-CM | POA: Diagnosis not present

## 2018-03-17 DIAGNOSIS — E119 Type 2 diabetes mellitus without complications: Secondary | ICD-10-CM

## 2018-03-17 DIAGNOSIS — E1151 Type 2 diabetes mellitus with diabetic peripheral angiopathy without gangrene: Secondary | ICD-10-CM | POA: Diagnosis not present

## 2018-03-17 DIAGNOSIS — Z7901 Long term (current) use of anticoagulants: Secondary | ICD-10-CM | POA: Diagnosis not present

## 2018-03-17 DIAGNOSIS — N1831 Chronic kidney disease, stage 3a: Secondary | ICD-10-CM

## 2018-03-17 DIAGNOSIS — I25119 Atherosclerotic heart disease of native coronary artery with unspecified angina pectoris: Secondary | ICD-10-CM | POA: Diagnosis present

## 2018-03-17 DIAGNOSIS — Z86718 Personal history of other venous thrombosis and embolism: Secondary | ICD-10-CM | POA: Diagnosis not present

## 2018-03-17 DIAGNOSIS — I4891 Unspecified atrial fibrillation: Secondary | ICD-10-CM | POA: Diagnosis present

## 2018-03-17 DIAGNOSIS — I10 Essential (primary) hypertension: Secondary | ICD-10-CM | POA: Diagnosis present

## 2018-03-17 DIAGNOSIS — Z885 Allergy status to narcotic agent status: Secondary | ICD-10-CM

## 2018-03-17 DIAGNOSIS — I480 Paroxysmal atrial fibrillation: Secondary | ICD-10-CM | POA: Diagnosis not present

## 2018-03-17 DIAGNOSIS — Z7902 Long term (current) use of antithrombotics/antiplatelets: Secondary | ICD-10-CM

## 2018-03-17 DIAGNOSIS — Z888 Allergy status to other drugs, medicaments and biological substances status: Secondary | ICD-10-CM | POA: Diagnosis not present

## 2018-03-17 DIAGNOSIS — Z87891 Personal history of nicotine dependence: Secondary | ICD-10-CM

## 2018-03-17 DIAGNOSIS — K219 Gastro-esophageal reflux disease without esophagitis: Secondary | ICD-10-CM | POA: Diagnosis not present

## 2018-03-17 DIAGNOSIS — Z8249 Family history of ischemic heart disease and other diseases of the circulatory system: Secondary | ICD-10-CM

## 2018-03-17 DIAGNOSIS — D649 Anemia, unspecified: Secondary | ICD-10-CM | POA: Diagnosis present

## 2018-03-17 DIAGNOSIS — R778 Other specified abnormalities of plasma proteins: Secondary | ICD-10-CM | POA: Diagnosis present

## 2018-03-17 DIAGNOSIS — Z955 Presence of coronary angioplasty implant and graft: Secondary | ICD-10-CM

## 2018-03-17 DIAGNOSIS — I2510 Atherosclerotic heart disease of native coronary artery without angina pectoris: Secondary | ICD-10-CM | POA: Diagnosis not present

## 2018-03-17 DIAGNOSIS — R7989 Other specified abnormal findings of blood chemistry: Secondary | ICD-10-CM | POA: Diagnosis present

## 2018-03-17 DIAGNOSIS — R748 Abnormal levels of other serum enzymes: Secondary | ICD-10-CM | POA: Diagnosis not present

## 2018-03-17 DIAGNOSIS — I200 Unstable angina: Secondary | ICD-10-CM | POA: Diagnosis not present

## 2018-03-17 DIAGNOSIS — R079 Chest pain, unspecified: Secondary | ICD-10-CM | POA: Diagnosis present

## 2018-03-17 HISTORY — DX: Type 2 diabetes mellitus without complications: E11.9

## 2018-03-17 HISTORY — DX: Sleep apnea, unspecified: G47.30

## 2018-03-17 HISTORY — DX: Chronic obstructive pulmonary disease, unspecified: J44.9

## 2018-03-17 HISTORY — DX: Cerebral infarction, unspecified: I63.9

## 2018-03-17 LAB — APTT: aPTT: 40 seconds — ABNORMAL HIGH (ref 24–36)

## 2018-03-17 LAB — MRSA PCR SCREENING: MRSA by PCR: NEGATIVE

## 2018-03-17 LAB — HEPARIN LEVEL (UNFRACTIONATED): Heparin Unfractionated: >2.2 IU/mL — ABNORMAL HIGH (ref 0.30–0.70)

## 2018-03-17 MED ORDER — ACETAMINOPHEN 500 MG PO TABS: 500.0000 mg | ORAL_TABLET | Freq: Two times a day (BID) | ORAL | Status: DC | PRN

## 2018-03-17 MED ORDER — ADULT MULTIVITAMIN W/MINERALS CH
1.0000 | ORAL_TABLET | Freq: Every day | ORAL | Status: DC
  Administered 2018-03-18 – 2018-03-19 (×2): 1 via ORAL
  Filled 2018-03-17 (×2): qty 1

## 2018-03-17 MED ORDER — HEPARIN (PORCINE) IN NACL 100-0.45 UNIT/ML-% IJ SOLN
1000.0000 [IU]/h | INTRAMUSCULAR | Status: DC
  Administered 2018-03-17: 850 [IU]/h via INTRAVENOUS
  Administered 2018-03-19: 1000 [IU]/h via INTRAVENOUS
  Filled 2018-03-17 (×2): qty 250

## 2018-03-17 MED ORDER — CLOPIDOGREL BISULFATE 75 MG PO TABS
75.0000 mg | ORAL_TABLET | Freq: Every day | ORAL | Status: DC
  Administered 2018-03-18 – 2018-03-19 (×2): 75 mg via ORAL
  Filled 2018-03-17 (×2): qty 1

## 2018-03-17 MED ORDER — NITROGLYCERIN 0.4 MG SL SUBL: 0.4000 mg | SUBLINGUAL_TABLET | SUBLINGUAL | Status: DC | PRN

## 2018-03-17 MED ORDER — TAMSULOSIN HCL 0.4 MG PO CAPS
0.4000 mg | ORAL_CAPSULE | Freq: Every day | ORAL | Status: DC
  Administered 2018-03-17 – 2018-03-18 (×2): 0.4 mg via ORAL
  Filled 2018-03-17 (×2): qty 1

## 2018-03-17 MED ORDER — EZETIMIBE 10 MG PO TABS
5.0000 mg | ORAL_TABLET | Freq: Every day | ORAL | Status: DC
  Administered 2018-03-18 – 2018-03-19 (×2): 5 mg via ORAL
  Filled 2018-03-17 (×2): qty 1

## 2018-03-17 MED ORDER — RAMIPRIL 10 MG PO CAPS
10.0000 mg | ORAL_CAPSULE | Freq: Two times a day (BID) | ORAL | Status: DC
  Administered 2018-03-17 – 2018-03-19 (×4): 10 mg via ORAL
  Filled 2018-03-17 (×4): qty 1

## 2018-03-17 MED ORDER — ONDANSETRON HCL 4 MG/2ML IJ SOLN: 4.0000 mg | Freq: Four times a day (QID) | INTRAMUSCULAR | Status: DC | PRN

## 2018-03-17 MED ORDER — ASPIRIN EC 81 MG PO TBEC
81.0000 mg | DELAYED_RELEASE_TABLET | Freq: Every day | ORAL | Status: DC
  Administered 2018-03-17 – 2018-03-18 (×2): 81 mg via ORAL
  Filled 2018-03-17 (×4): qty 1

## 2018-03-17 MED ORDER — METOPROLOL TARTRATE 50 MG PO TABS
50.0000 mg | ORAL_TABLET | Freq: Two times a day (BID) | ORAL | Status: DC
  Administered 2018-03-17 – 2018-03-19 (×4): 50 mg via ORAL
  Filled 2018-03-17 (×4): qty 1

## 2018-03-17 MED ORDER — DILTIAZEM HCL ER COATED BEADS 180 MG PO CP24
180.0000 mg | ORAL_CAPSULE | Freq: Every day | ORAL | Status: DC
  Administered 2018-03-18 – 2018-03-19 (×2): 180 mg via ORAL
  Filled 2018-03-17 (×2): qty 1

## 2018-03-17 MED ORDER — AMIODARONE HCL 200 MG PO TABS
400.0000 mg | ORAL_TABLET | Freq: Two times a day (BID) | ORAL | Status: DC
  Administered 2018-03-17 – 2018-03-19 (×4): 400 mg via ORAL
  Filled 2018-03-17 (×5): qty 2

## 2018-03-17 MED ORDER — RIVASTIGMINE TARTRATE 1.5 MG PO CAPS
6.0000 mg | ORAL_CAPSULE | Freq: Two times a day (BID) | ORAL | Status: DC
  Administered 2018-03-17 – 2018-03-19 (×4): 6 mg via ORAL
  Filled 2018-03-17 (×4): qty 4

## 2018-03-17 MED ORDER — MEMANTINE HCL 5 MG PO TABS
10.0000 mg | ORAL_TABLET | Freq: Two times a day (BID) | ORAL | Status: DC
  Administered 2018-03-17 – 2018-03-19 (×4): 10 mg via ORAL
  Filled 2018-03-17 (×4): qty 2

## 2018-03-17 MED ORDER — PANTOPRAZOLE SODIUM 40 MG PO TBEC
40.0000 mg | DELAYED_RELEASE_TABLET | Freq: Every day | ORAL | Status: DC
  Administered 2018-03-18 – 2018-03-19 (×2): 40 mg via ORAL
  Filled 2018-03-17 (×2): qty 1

## 2018-03-17 NOTE — H&P (Signed)
Cardiology Admission History and Physical:   Patient ID: Donald Gordon MRN: [id number]; DOB: [birth year]   Admission date: 03/17/2018  Primary Care Provider: Patient, No Pcp Per Primary Cardiologist: No primary care provider on file.  Primary Electrophysiologist:  None   Chief Complaint:  Chest pain  Patient Profile:   Donald Gordon is a 77 y.o. male with history of extensive vascular disease, coronary artery disease, and recent vascular surgery with fasciotomy.  He is admitted to [facility] with atrial fibrillation with RVR complicated by chest pain, marked ST depression on EKG, and elevated troponin.  He is transferred to [facility] for further treatment including possible cardiac catheterization.  History of Present Illness:   Donald Gordon he is here with his daughter.  He has just arrived in transfer from [facility].  The patient was hospitalized with chest pain after calling EMS about 48 hours ago.  He was found to be in atrial fibrillation with RVR and was treated with IV diltiazem and oral metoprolol.  The patient is been anticoagulated with apixaban and he is also treated with clopidogrel because of his vascular disease.  He spontaneously converted to sinus rhythm but has been in and out of atrial fibrillation over the last 24 hours.  He underwent a Lexiscan Myoview stress test today with report of ischemic EKG changes.  The perfusion images are currently pending.  Following his stress test he went back into atrial fibrillation.  At the time of my evaluation he is in normal sinus rhythm and he is not experiencing any chest pain at rest.  The patient has been previously active.  He works frequently in the yard.  He has had a slow recovery since undergoing extensive vascular surgery in July 2019 when he was treated with revascularization surgery and fasciotomies.  Over the last 3 to 4 days he complains of worsening fatigue and weakness.  He is also had episodic shortness of breath  and chest discomfort that he describes as a pressure-like sensation in the center of his chest.  He feels like he is been having more frequent atrial fibrillation.   Past Medical History:  Diagnosis Date  . Arthritis   . Cataract   . Chronic edema    a. Chronic RLE edema.  . Coronary artery disease    a. MI s/p balloon 1996, details unclear.  . Diabetes mellitus without complication ([place])    TYPE 2   . Dysrhythmia    PROXIMAL MARGIN FIBRILATION  . GERD (gastroesophageal reflux disease)   . Hyperlipidemia   . Hypertension   . Myocardial infarction ([place])   . PAD (peripheral artery disease) ([place])    a. s/p stenting 08/2012, 02/2013.   Marland Kitchen PAF (paroxysmal atrial fibrillation) ([place])   . Thrombosis of lower extremity    a. Listed on patient's medical bracelet - at [place].    Past Surgical History:  Procedure Laterality Date  . APPENDECTOMY    . APPLICATION OF WOUND VAC Right 01/09/2018   Procedure: APPLICATION OF WOUND VAC ON RIGHT LOWER LEG;  Surgeon: Conrad Cromwell, MD;  Location: [place];  Service: Vascular;  Laterality: Right;  . [place]  . FASCIOTOMY Right 01/09/2018   Procedure: FASCIOTOMY RIGHT LOWER LEG;  Surgeon: Conrad Kirkwood, MD;  Location: [place];  Service: Vascular;  Laterality: Right;  . FASCIOTOMY CLOSURE Right 01/11/2018   Procedure: FASCIOTOMY CLOSURE RIGHT CALF;  Surgeon: Conrad [place], MD;  Location: [place];  Service:  Vascular;  Laterality: Right;  . FEMORAL-POPLITEAL BYPASS GRAFT Right 01/09/2018   Procedure: RIGHT FEMORAL-POPLITEAL ARTERY BYPASS USING PROPATEN 6MM X 80CM VASCULAR GRAFT;  Surgeon: Conrad [place], MD;  Location: [place];  Service: Vascular;  Laterality: Right;  . HEMORRHOID SURGERY    . PATCH ANGIOPLASTY Right 01/09/2018   Procedure: PATCH ANGIOPLASTY USING Rueben Bash BIOLOGIC 1CM X 6CM PATCH;  Surgeon: Conrad Ashburn, MD;  Location: [place];  Service: Vascular;  Laterality: Right;  . THROMBECTOMY FEMORAL ARTERY Right 01/09/2018   Procedure: THROMBECTOMY  RIGHT LOWER LEG;  Surgeon: Conrad Strafford, MD;  Location: [place];  Service: Vascular;  Laterality: Right;  . TONSILLECTOMY       Medications Prior to Admission: Prior to Admission medications   Medication Sig Start Date End Date Taking? Authorizing Provider  acetaminophen (TYLENOL) 500 MG tablet Take 500 mg by mouth 2 (two) times daily as needed (for pain or headaches).     [provider]  apixaban (ELIQUIS) 5 MG TABS tablet Take 5 mg by mouth 2 (two) times daily.    [provider]  aspirin EC 81 MG tablet Take 81 mg by mouth at bedtime.     [provider]  clopidogrel (PLAVIX) 75 MG tablet Take 1 tablet (75 mg total) by mouth daily. 01/14/18   Dagoberto Ligas, PA-C  diclofenac sodium (VOLTAREN) 1 % GEL Apply 2 g topically See admin instructions. Apply 2 grams to affected areas three times a day as directed    [provider]  diltiazem (DILACOR XR) 180 MG 24 hr capsule Take 180 mg by mouth daily.    [provider]  ezetimibe (ZETIA) 10 MG tablet Take 5 mg by mouth daily.    [provider]  hydrochlorothiazide (MICROZIDE) 12.5 MG capsule Take 12.5 mg by mouth daily with breakfast.    [provider]  memantine (NAMENDA) 10 MG tablet Take 10 mg by mouth 2 (two) times daily.    [provider]  metoprolol (LOPRESSOR) 50 MG tablet Take 50 mg by mouth 2 (two) times daily.    [provider]  Multiple Vitamins-Minerals (MULTIVITAMIN WITH MINERALS) tablet Take 1 tablet by mouth daily.    [provider]  nitroGLYCERIN (NITROSTAT) 0.4 MG SL tablet Place 0.4 mg under the tongue every 5 (five) minutes as needed for chest pain.    [provider]  oxyCODONE (OXY IR/ROXICODONE) 5 MG immediate release tablet Take 1 tablet (5 mg total) by mouth every 6 (six) hours as needed for moderate pain. Patient not taking: Reported on 02/01/2018 01/14/18   Dagoberto Ligas, PA-C  pantoprazole (PROTONIX) 40 MG tablet Take  40 mg by mouth daily.    [provider]  ramipril (ALTACE) 10 MG capsule Take 10 mg by mouth 2 (two) times daily.    [provider]  rivastigmine (EXELON) 6 MG capsule Take 6 mg by mouth 2 (two) times daily.    [provider]  tamsulosin (FLOMAX) 0.4 MG CAPS capsule Take 0.4 mg by mouth at bedtime.    [provider]     Allergies:    Allergies  Allergen Reactions  . Oxycodone Nausea Only  . Statins Other (See Comments)    Muscle and Bone pain Muscle and Bone pain    Social History:   Social History   Socioeconomic History  . Marital status: Legally Separated    Spouse name: Not on file  . Number of children: Not on file  .  Years of education: Not on file  . Highest education level: Not on file  Occupational History  . Not on file  Social Needs  . Financial resource strain: Not on file  . Food insecurity:    Worry: Not on file    Inability: Not on file  . Transportation needs:    Medical: Not on file    Non-medical: Not on file  Tobacco Use  . Smoking status: Former Research scientist (life sciences)  . Smokeless tobacco: Never Used  . Tobacco comment: Smoked for 30 years, quit 1990s  Substance and Sexual Activity  . Alcohol use: No    Alcohol/week: 0.0 standard drinks    Comment: Remote alcohol use  . Drug use: No  . Sexual activity: Not on file  Lifestyle  . Physical activity:    Days per week: Not on file    Minutes per session: Not on file  . Stress: Not on file  Relationships  . Social connections:    Talks on phone: Not on file    Gets together: Not on file    Attends religious service: Not on file    Active member of club or organization: Not on file    Attends meetings of clubs or organizations: Not on file    Relationship status: Not on file  . Intimate partner violence:    Fear of current or ex partner: Not on file    Emotionally abused: Not on file    Physically abused: Not on file    Forced sexual activity: Not on file  Other Topics  Concern  . Not on file  Social History Narrative  . Not on file    Family History:   The patient's family history includes CAD in his brother and mother; Hypertension in his unknown relative.    ROS:  Please see the history of present illness.  All other ROS reviewed and negative.     Physical Exam/Data:   Vitals:   03/17/18 1609  BP: (!) 143/72  Pulse: 70  Resp: 16  Temp: 97.6 F (36.4 C)  TempSrc: Oral  SpO2: 98%  Weight: 80.5 kg  Height: 5\' 9"  (1.753 m)   No intake or output data in the 24 hours ending 03/17/18 1716 Filed Weights   03/17/18 1609  Weight: 80.5 kg   Body mass index is 26.2 kg/m.  General:  Well nourished, well developed, in no acute distress HEENT: normal Lymph: no adenopathy Neck: no JVD Endocrine:  No thryomegaly Vascular: Bilateral carotid bruits right greater than left; bilateral groin sites are clear with a healed incision over the right groin.  Radial pulses are 2+ and equal. Cardiac:  normal S1, S2; RRR; no murmur  Lungs:  clear to auscultation bilaterally, no wheezing, rhonchi or rales  Abd: soft, nontender, no hepatomegaly  Ext: no edema Musculoskeletal:  No deformities, BUE and BLE strength normal and equal Skin: warm and dry.  Well-healed surgical incision in the right leg Neuro:  CNs 2-12 intact, no focal abnormalities noted Psych:  Normal affect    EKG:  The ECG that was done  was personally reviewed and demonstrates atrial fibrillation 150 bpm, marked ST abnormality consider diffuse subendocardial ischemia/injury  Relevant CV Studies: 2D echocardiogram shows poor image quality, normal LV function with hyperdynamic LVEF greater than 65%, normal RV size and function, normal biatrial size, and mild aortic valve sclerosis without stenosis.  Laboratory Data:  ChemistryNo results for input(s): NA, K, CL, CO2, GLUCOSE, BUN, CREATININE, CALCIUM, GFRNONAA, GFRAA,  ANIONGAP in the last 168 hours.  No results for input(s): PROT, ALBUMIN,  AST, ALT, ALKPHOS, BILITOT in the last 168 hours. HematologyNo results for input(s): WBC, RBC, HGB, HCT, MCV, MCH, MCHC, RDW, PLT in the last 168 hours. Cardiac EnzymesNo results for input(s): TROPONINI in the last 168 hours. No results for input(s): TROPIPOC in the last 168 hours.  BNPNo results for input(s): BNP, PROBNP in the last 168 hours.  DDimer No results for input(s): DDIMER in the last 168 hours.  Radiology/Studies:  No results found.  Assessment and Plan:   1. Non-ST elevation MI: The patient has elevated troponin in the setting of atrial fibrillation with RVR, differential diagnosis includes non-STEMI versus demand ischemia.  The patient had significant chest discomfort and deep ST depression suggestive of diffuse ischemia.  I think cardiac catheterization and possible PCI are indicated.  Hopefully he will have access for a radial approach considering his extensive lower extremity PAD.  He received apixaban this morning so his cardiac catheterization will be scheduled after another 24 hours of washout. I have reviewed the risks, indications, and alternatives to cardiac catheterization, possible angioplasty, and stenting with the patient. Risks include but are not limited to bleeding, infection, vascular injury, stroke, myocardial infection, arrhythmia, kidney injury, radiation-related injury in the case of prolonged fluoroscopy use, emergency cardiac surgery, and death. The patient understands the risks of serious complication is 1-2 in 1607 with diagnostic cardiac cath and 1-2% or less with angioplasty/stenting.  The patient will continue on clopidogrel. 2. Atrial fibrillation with RVR: He is now in sinus rhythm.  He is having frequent episodes of atrial fibrillation.  Antiarrhythmic drug therapy is indicated.  Considering his comorbid conditions, I think amiodarone is probably the safest and most efficacious drug for him to use.  We will start him on 400 mg twice daily.  Will resume  apixaban after his cardiac catheterization.  We will bridge him with IV heparin for ACS.  The patient is currently on a Cardizem drip 5 mg/h.  We will discontinue this once he starts on oral amiodarone. 3. Peripheral arterial disease: Appears stable after recent surgery. 4. COPD: The patient carries this diagnosis.  He has been told about COPD from the [place] system.  He is never needed home oxygen nor does he use any maintenance therapy.  He quit smoking many years ago. 5. DVT prophylaxis: IV heparin, transition to apixaban post catheterization 6. Hyperlipidemia: The patient is statin intolerant.  He is treated with a PCSK9 inhibitor.  Severity of Illness: The appropriate patient status for this patient is INPATIENT. Inpatient status is judged to be reasonable and necessary in order to provide the required intensity of service to ensure the patient's safety. The patient's presenting symptoms, physical exam findings, and initial radiographic and laboratory data in the context of their chronic comorbidities is felt to place them at high risk for further clinical deterioration. Furthermore, it is not anticipated that the patient will be medically stable for discharge from the hospital within 2 midnights of admission. The following factors support the patient status of inpatient.   * I certify that at the point of admission it is my clinical judgment that the patient will require inpatient hospital care spanning beyond 2 midnights from the point of admission due to high intensity of service, high risk for further deterioration and high frequency of surveillance required.*    For questions or updates, please contact [place] Please consult [email or web address] for contact info under  Signed, Sherren Mocha, MD  03/17/2018 5:16 PM

## 2018-03-17 NOTE — Progress Notes (Signed)
ANTICOAGULATION CONSULT NOTE - Initial Consult  Pharmacy Consult for heparin Indication: chest pain/ACS  Allergies  Allergen Reactions  . Oxycodone Nausea Only  . Statins Other (See Comments)    Muscle and Bone pain Muscle and Bone pain    Patient Measurements: Height: 5\' 9"  (175.3 cm) Weight: 177 lb 6.4 oz (80.5 kg) IBW/kg (Calculated) : 70.7   Vital Signs: Temp: 97.6 F (36.4 C) (09/25 1609) Temp Source: Oral (09/25 1609) BP: 143/72 (09/25 1609) Pulse Rate: 70 (09/25 1609)  Labs: No results for input(s): HGB, HCT, PLT, APTT, LABPROT, INR, HEPARINUNFRC, HEPRLOWMOCWT, CREATININE, CKTOTAL, CKMB, TROPONINI in the last 72 hours.  CrCl cannot be calculated (Patient's most recent lab result is older than the maximum 21 days allowed.).   Medical History: Past Medical History:  Diagnosis Date  . Arthritis   . Cataract   . Chronic edema    a. Chronic RLE edema.  . Coronary artery disease    a. MI s/p balloon 1996, details unclear.  . Diabetes mellitus without complication ([place])    TYPE 2   . Dysrhythmia    PROXIMAL MARGIN FIBRILATION  . GERD (gastroesophageal reflux disease)   . Hyperlipidemia   . Hypertension   . Myocardial infarction ([place])   . PAD (peripheral artery disease) ([place])    a. s/p stenting 08/2012, 02/2013.   Marland Kitchen PAF (paroxysmal atrial fibrillation) ([place])   . Thrombosis of lower extremity    a. Listed on patient's medical bracelet - at [place].    Medications:  Medications Prior to Admission  Medication Sig Dispense Refill Last Dose  . acetaminophen (TYLENOL) 500 MG tablet Take 500 mg by mouth 2 (two) times daily as needed (for pain or headaches).    Taking  . apixaban (ELIQUIS) 5 MG TABS tablet Take 5 mg by mouth 2 (two) times daily.   Taking  . aspirin EC 81 MG tablet Take 81 mg by mouth at bedtime.    Not Taking  . clopidogrel (PLAVIX) 75 MG tablet Take 1 tablet (75 mg total) by mouth daily. 90 tablet 1 Taking  . diclofenac sodium (VOLTAREN) 1 % GEL Apply  2 g topically See admin instructions. Apply 2 grams to affected areas three times a day as directed   Taking  . diltiazem (DILACOR XR) 180 MG 24 hr capsule Take 180 mg by mouth daily.   Taking  . ezetimibe (ZETIA) 10 MG tablet Take 5 mg by mouth daily.   Taking  . hydrochlorothiazide (MICROZIDE) 12.5 MG capsule Take 12.5 mg by mouth daily with breakfast.   Taking  . memantine (NAMENDA) 10 MG tablet Take 10 mg by mouth 2 (two) times daily.   Taking  . metoprolol (LOPRESSOR) 50 MG tablet Take 50 mg by mouth 2 (two) times daily.   Taking  . Multiple Vitamins-Minerals (MULTIVITAMIN WITH MINERALS) tablet Take 1 tablet by mouth daily.   Taking  . nitroGLYCERIN (NITROSTAT) 0.4 MG SL tablet Place 0.4 mg under the tongue every 5 (five) minutes as needed for chest pain.   Taking  . oxyCODONE (OXY IR/ROXICODONE) 5 MG immediate release tablet Take 1 tablet (5 mg total) by mouth every 6 (six) hours as needed for moderate pain. (Patient not taking: Reported on 02/01/2018) 20 tablet 0 Not Taking  . pantoprazole (PROTONIX) 40 MG tablet Take 40 mg by mouth daily.   Taking  . ramipril (ALTACE) 10 MG capsule Take 10 mg by mouth 2 (two) times daily.   Taking  . rivastigmine (EXELON)  6 MG capsule Take 6 mg by mouth 2 (two) times daily.   Taking  . tamsulosin (FLOMAX) 0.4 MG CAPS capsule Take 0.4 mg by mouth at bedtime.   Taking   Scheduled:  . amiodarone  400 mg Oral BID  . aspirin EC  81 mg Oral QHS  . clopidogrel  75 mg Oral Daily  . diltiazem  180 mg Oral Daily  . ezetimibe  5 mg Oral Daily  . memantine  10 mg Oral BID  . metoprolol tartrate  50 mg Oral BID  . multivitamin with minerals  1 tablet Oral Daily  . pantoprazole  40 mg Oral Daily  . ramipril  10 mg Oral BID  . rivastigmine  6 mg Oral BID  . tamsulosin  0.4 mg Oral QHS    Assessment: 77 yo male from [facility] with CP and afib/RVR. He is noted on apixaban for afib with last dose this morning although the timing is not clear. Pharmacy  consulted to dose heparin (was not on heparin upon transfer here). Plans noted for cath.   Goal of Therapy:  Heparin level 0.3-0.7 units/ml Monitor platelets by anticoagulation protocol: Yes   Plan:  -Will obtain a baseline heparin level and aPTT -Start heparin at 11pm tonight at 850 units/hr -Heparin level in 8 hours and daily wth CBC daily  Hildred Laser, PharmD Clinical Pharmacist Please check Amion for pharmacy contact number

## 2018-03-17 NOTE — Progress Notes (Signed)
Paged IM to get copy of stress report. IM MD relayed result:  DEFECT IN ANTERIOR/INFERIOR WALLS DUE TO PATIENT INSTABILITY STRESS PORTION COULDN'T BE PERFORMED THIS AREAS MAY REPRESENT AREAS OF PRIOR INFARCT ANTERIOR DEFECT PRIMARILY NEAR THE APEX INFERIOR DEFECT NEAR THE BASE   Dewain Platz PA-C

## 2018-03-18 ENCOUNTER — Encounter (HOSPITAL_COMMUNITY): Payer: Self-pay | Admitting: General Practice

## 2018-03-18 LAB — BASIC METABOLIC PANEL
Anion gap: 9 (ref 5–15)
BUN: 16 mg/dL (ref 8–23)
CO2: 21 mmol/L — ABNORMAL LOW (ref 22–32)
Calcium: 8.2 mg/dL — ABNORMAL LOW (ref 8.9–10.3)
Chloride: 108 mmol/L (ref 98–111)
Creatinine, Ser: 1.21 mg/dL (ref 0.61–1.24)
GFR calc Af Amer: >60 mL/min (ref >60)
GFR, EST NON AFRICAN AMERICAN: 56 mL/min — AB (ref >60)
GLUCOSE: 99 mg/dL (ref 70–99)
Potassium: 4 mmol/L (ref 3.5–5.1)
SODIUM: 138 mmol/L (ref 135–145)

## 2018-03-18 LAB — HEPARIN LEVEL (UNFRACTIONATED): HEPARIN UNFRACTIONATED: 1.68 [IU]/mL — AB (ref 0.30–0.70)

## 2018-03-18 LAB — CBC
HEMATOCRIT: 30.8 % — AB (ref 39.0–52.0)
Hemoglobin: 9.6 g/dL — ABNORMAL LOW (ref 13.0–17.0)
MCH: 29.5 pg (ref 26.0–34.0)
MCHC: 31.2 g/dL (ref 30.0–36.0)
MCV: 94.8 fL (ref 78.0–100.0)
PLATELETS: 182 10*3/uL (ref 150–400)
RBC: 3.25 MIL/uL — ABNORMAL LOW (ref 4.22–5.81)
RDW: 12.8 % (ref 11.5–15.5)
WBC: 6.6 10*3/uL (ref 4.0–10.5)

## 2018-03-18 LAB — APTT
APTT: 50 s — AB (ref 24–36)
aPTT: 56 seconds — ABNORMAL HIGH (ref 24–36)

## 2018-03-18 MED ORDER — SODIUM CHLORIDE 0.9 % WEIGHT BASED INFUSION: 1.0000 mL/kg/h | INTRAVENOUS | Status: DC

## 2018-03-18 MED ORDER — SODIUM CHLORIDE 0.9 % WEIGHT BASED INFUSION
3.0000 mL/kg/h | INTRAVENOUS | Status: AC
  Administered 2018-03-19: 3 mL/kg/h via INTRAVENOUS

## 2018-03-18 MED ORDER — SODIUM CHLORIDE 0.9 % WEIGHT BASED INFUSION
1.0000 mL/kg/h | INTRAVENOUS | Status: DC
  Administered 2018-03-19: 1 mL/kg/h via INTRAVENOUS

## 2018-03-18 MED ORDER — SODIUM CHLORIDE 0.9% FLUSH: 3.0000 mL | INTRAVENOUS | Status: DC | PRN

## 2018-03-18 MED ORDER — SODIUM CHLORIDE 0.9 % IV SOLN: 250.0000 mL | INTRAVENOUS | Status: DC | PRN

## 2018-03-18 MED ORDER — SODIUM CHLORIDE 0.9% FLUSH
3.0000 mL | Freq: Two times a day (BID) | INTRAVENOUS | Status: DC
  Administered 2018-03-19: 3 mL via INTRAVENOUS

## 2018-03-18 NOTE — Progress Notes (Addendum)
Progress Note  Patient Name: Donald Gordon Date of Encounter: 03/18/2018  Primary Cardiologist: [facility]  Subjective   Feeling well. No chest pain, sob or palpitations.   Inpatient Medications    Scheduled Meds: . amiodarone  400 mg Oral BID  . aspirin EC  81 mg Oral QHS  . clopidogrel  75 mg Oral Daily  . diltiazem  180 mg Oral Daily  . ezetimibe  5 mg Oral Daily  . memantine  10 mg Oral BID  . metoprolol tartrate  50 mg Oral BID  . multivitamin with minerals  1 tablet Oral Daily  . pantoprazole  40 mg Oral Daily  . ramipril  10 mg Oral BID  . rivastigmine  6 mg Oral BID  . tamsulosin  0.4 mg Oral QHS   Continuous Infusions: . heparin 1,000 Units/hr (03/18/18 0713)   PRN Meds: acetaminophen, nitroGLYCERIN, ondansetron (ZOFRAN) IV   Vital Signs    Vitals:   03/17/18 2040 03/18/18 0022 03/18/18 0449 03/18/18 0744  BP:  (!) 103/57 118/60 (!) 135/59  Pulse:  66 67 67  Resp:  (!) 22 19 20   Temp:  97.6 F (36.4 C) (!) 97.5 F (36.4 C) 97.7 F (36.5 C)  TempSrc:  Oral Oral Oral  SpO2: 93% 95% 96% 95%  Weight:   80.9 kg   Height:        Intake/Output Summary (Last 24 hours) at 03/18/2018 0836 Last data filed at 03/18/2018 0700 Gross per 24 hour  Intake 288.06 ml  Output 125 ml  Net 163.06 ml   Filed Weights   03/17/18 1609 03/18/18 0449  Weight: 80.5 kg 80.9 kg    Telemetry    SR at controlled rate - Personally Reviewed  ECG    N/A  Physical Exam   GEN: No acute distress.   Neck: No JVD Cardiac: RRR, no murmurs, rubs, or gallops.  Respiratory: Clear to auscultation bilaterally. GI: Soft, nontender, non-distended  MS: No edema; No deformity. Neuro:  Nonfocal  Psych: Normal affect   Labs    Chemistry Recent Labs  Lab 03/18/18 0423  NA 138  K 4.0  CL 108  CO2 21*  GLUCOSE 99  BUN 16  CREATININE 1.21  CALCIUM 8.2*  GFRNONAA 56*  GFRAA >60  ANIONGAP 9     Hematology Recent Labs  Lab 03/18/18 0423  WBC 6.6  RBC 3.25*    HGB 9.6*  HCT 30.8*  MCV 94.8  MCH 29.5  MCHC 31.2  RDW 12.8  PLT 182    Radiology    No results found.  Cardiac Studies   Pending cath   Patient Profile     Donald Gordon is a 77 y.o. male with a hx of CAD (s/p PTCA 1996) PAF, HTN, HLD, DM, syncope, PAD with recent popliteal thrombectomy with bypass grafting and fasciotomy by Dr. Bridgett Larsson 12/2017 transferred from [facility] with atrial fibrillation with RVR complicated by chest pain, marked ST depression on EKG.   Assessment & Plan    1. NSTEMI -  Peak of troponin 0.06.His troponin elevation due be demand due to afib RVR. The patient had significant chest discomfort and deep ST depression suggestive of diffuse ischemia. Holding Eliquis (Last dose AM of 9/25). Plan for cath tomorrow.  - Continue ASA, Plavix, heparin and BB.   2. PAF - Maintaining sinus rhythm. Continue amiodarone, BB and cardizem. Heparin for anticoagulation while holding Eliquis.   3. Anemia - 12.1 on 9/24 >>10.1  on 9/25 >> here 9.6 today. Follow closely. Denies bleeding issue  4. HLD - Statin intolerance. Continue Zetia. Consider PCSK9 inhibitor as outpatient.   For questions or updates, please contact [place] Please consult [email or web address] for contact info under      SignedLeanor Kail, PA  03/18/2018, 8:36 AM    Patient seen, examined. Available data reviewed. Agree with findings, assessment, and plan as outlined by Robbie Lis, PA-C.  The patient is independently interviewed and examined.  On exam he is alert and oriented in no distress, heart is regular rate and rhythm with no murmur gallop, lungs are clear, abdomen soft nontender, extremities show no edema, skin is warm dry without rash.  He is chest pain-free since admission.  He is maintaining sinus rhythm now on oral amiodarone.  Eliquis is on hold for cardiac catheterization tomorrow.  Await cardiac catheterization for further disposition.  Sherren Mocha, M.D. 03/18/2018 11:59  AM

## 2018-03-18 NOTE — Progress Notes (Addendum)
[place] for Heparin Indication: chest pain/ACS  Allergies  Allergen Reactions  . Oxycodone Nausea Only  . Statins Other (See Comments)    Muscle and Bone pain Muscle and Bone pain    Patient Measurements: Height: 5\' 9"  (175.3 cm) Weight: 178 lb 4.8 oz (80.9 kg) IBW/kg (Calculated) : 70.7   Vital Signs: Temp: 97.5 F (36.4 C) (09/26 0449) Temp Source: Oral (09/26 0449) BP: 118/60 (09/26 0449) Pulse Rate: 67 (09/26 0449)  Labs: Recent Labs    03/17/18 1909 03/18/18 0423  HGB  --  9.6*  HCT  --  30.8*  PLT  --  182  APTT 40* 56*  HEPARINUNFRC >2.20* >2.20*  CREATININE  --  1.21    Estimated Creatinine Clearance: 51.1 mL/min (by C-G formula based on SCr of 1.21 mg/dL).   Medical History: Past Medical History:  Diagnosis Date  . Arthritis   . Cataract   . Chronic edema    a. Chronic RLE edema.  . Coronary artery disease    a. MI s/p balloon 1996, details unclear.  . Diabetes mellitus without complication ([place])    TYPE 2   . Dysrhythmia    PROXIMAL MARGIN FIBRILATION  . GERD (gastroesophageal reflux disease)   . Hyperlipidemia   . Hypertension   . Myocardial infarction ([place])   . PAD (peripheral artery disease) ([place])    a. s/p stenting 08/2012, 02/2013.   Marland Kitchen PAF (paroxysmal atrial fibrillation) ([place])   . Thrombosis of lower extremity    a. Listed on patient's medical bracelet - at [place].    Medications:  Medications Prior to Admission  Medication Sig Dispense Refill Last Dose  . acetaminophen (TYLENOL) 500 MG tablet Take 500 mg by mouth 2 (two) times daily as needed (for pain or headaches).    unk at prn  . apixaban (ELIQUIS) 5 MG TABS tablet Take 5 mg by mouth 2 (two) times daily.   unk  . clopidogrel (PLAVIX) 75 MG tablet Take 1 tablet (75 mg total) by mouth daily. 90 tablet 1 unk  . diclofenac sodium (VOLTAREN) 1 % GEL Apply 2 g topically See admin instructions. Apply 2 grams to affected areas three times a day as  directed   unk at prn  . diltiazem (DILACOR XR) 180 MG 24 hr capsule Take 180 mg by mouth daily.   unk  . ezetimibe (ZETIA) 10 MG tablet Take 5 mg by mouth daily.   unk  . hydrochlorothiazide (MICROZIDE) 12.5 MG capsule Take 12.5 mg by mouth daily.    unk  . memantine (NAMENDA) 10 MG tablet Take 10 mg by mouth 2 (two) times daily.   unk  . metoprolol (LOPRESSOR) 50 MG tablet Take 50 mg by mouth 2 (two) times daily.   unk  . nitroGLYCERIN (NITROSTAT) 0.4 MG SL tablet Place 0.4 mg under the tongue every 5 (five) minutes as needed for chest pain.   unk at prn  . pantoprazole (PROTONIX) 40 MG tablet Take 40 mg by mouth daily.   unk  . ramipril (ALTACE) 10 MG capsule Take 10 mg by mouth 2 (two) times daily.   unk  . rivastigmine (EXELON) 6 MG capsule Take 6 mg by mouth 2 (two) times daily.   unk  . oxyCODONE (OXY IR/ROXICODONE) 5 MG immediate release tablet Take 1 tablet (5 mg total) by mouth every 6 (six) hours as needed for moderate pain. (Patient not taking: Reported on 02/01/2018) 20 tablet 0 Not  Taking at Unknown time   Scheduled:  . amiodarone  400 mg Oral BID  . aspirin EC  81 mg Oral QHS  . clopidogrel  75 mg Oral Daily  . diltiazem  180 mg Oral Daily  . ezetimibe  5 mg Oral Daily  . memantine  10 mg Oral BID  . metoprolol tartrate  50 mg Oral BID  . multivitamin with minerals  1 tablet Oral Daily  . pantoprazole  40 mg Oral Daily  . ramipril  10 mg Oral BID  . rivastigmine  6 mg Oral BID  . tamsulosin  0.4 mg Oral QHS    Assessment: 77 yo male from [facility] with CP and afib/RVR. He is noted on apixaban for afib with last dose this morning although the timing is not clear. Pharmacy consulted to dose heparin (was not on heparin upon transfer here). Plans noted for cath.  9/26 AM update: heparin level is elevated due to apixaban use, using aPTT to dose heparin for now, aPTT is below goal at 56   Goal of Therapy:  APTT 66-102 secs Heparin level 0.3-0.7 units/ml Monitor  platelets by anticoagulation protocol: Yes   Plan:  -Inc heparin to 1000 units/hr -1430 HL/aPTT  Narda Bonds, PharmD, BCPS Clinical Pharmacist Phone: [phone]

## 2018-03-18 NOTE — H&P (View-Only) (Signed)
Progress Note  Patient Name: Donald Gordon Date of Encounter: 03/18/2018  Primary Cardiologist: [facility]  Subjective   Feeling well. No chest pain, sob or palpitations.   Inpatient Medications    Scheduled Meds: . amiodarone  400 mg Oral BID  . aspirin EC  81 mg Oral QHS  . clopidogrel  75 mg Oral Daily  . diltiazem  180 mg Oral Daily  . ezetimibe  5 mg Oral Daily  . memantine  10 mg Oral BID  . metoprolol tartrate  50 mg Oral BID  . multivitamin with minerals  1 tablet Oral Daily  . pantoprazole  40 mg Oral Daily  . ramipril  10 mg Oral BID  . rivastigmine  6 mg Oral BID  . tamsulosin  0.4 mg Oral QHS   Continuous Infusions: . heparin 1,000 Units/hr (03/18/18 0713)   PRN Meds: acetaminophen, nitroGLYCERIN, ondansetron (ZOFRAN) IV   Vital Signs    Vitals:   03/17/18 2040 03/18/18 0022 03/18/18 0449 03/18/18 0744  BP:  (!) 103/57 118/60 (!) 135/59  Pulse:  66 67 67  Resp:  (!) 22 19 20   Temp:  97.6 F (36.4 C) (!) 97.5 F (36.4 C) 97.7 F (36.5 C)  TempSrc:  Oral Oral Oral  SpO2: 93% 95% 96% 95%  Weight:   80.9 kg   Height:        Intake/Output Summary (Last 24 hours) at 03/18/2018 0836 Last data filed at 03/18/2018 0700 Gross per 24 hour  Intake 288.06 ml  Output 125 ml  Net 163.06 ml   Filed Weights   03/17/18 1609 03/18/18 0449  Weight: 80.5 kg 80.9 kg    Telemetry    SR at controlled rate - Personally Reviewed  ECG    N/A  Physical Exam   GEN: No acute distress.   Neck: No JVD Cardiac: RRR, no murmurs, rubs, or gallops.  Respiratory: Clear to auscultation bilaterally. GI: Soft, nontender, non-distended  MS: No edema; No deformity. Neuro:  Nonfocal  Psych: Normal affect   Labs    Chemistry Recent Labs  Lab 03/18/18 0423  NA 138  K 4.0  CL 108  CO2 21*  GLUCOSE 99  BUN 16  CREATININE 1.21  CALCIUM 8.2*  GFRNONAA 56*  GFRAA >60  ANIONGAP 9     Hematology Recent Labs  Lab 03/18/18 0423  WBC 6.6  RBC 3.25*    HGB 9.6*  HCT 30.8*  MCV 94.8  MCH 29.5  MCHC 31.2  RDW 12.8  PLT 182    Radiology    No results found.  Cardiac Studies   Pending cath   Patient Profile     Donald Gordon is a 77 y.o. male with a hx of CAD (s/p PTCA 1996) PAF, HTN, HLD, DM, syncope, PAD with recent popliteal thrombectomy with bypass grafting and fasciotomy by Dr. Bridgett Larsson 12/2017 transferred from [facility] with atrial fibrillation with RVR complicated by chest pain, marked ST depression on EKG.   Assessment & Plan    1. NSTEMI -  Peak of troponin 0.06.His troponin elevation due be demand due to afib RVR. The patient had significant chest discomfort and deep ST depression suggestive of diffuse ischemia. Holding Eliquis (Last dose AM of 9/25). Plan for cath tomorrow.  - Continue ASA, Plavix, heparin and BB.   2. PAF - Maintaining sinus rhythm. Continue amiodarone, BB and cardizem. Heparin for anticoagulation while holding Eliquis.   3. Anemia - 12.1 on 9/24 >>10.1  on 9/25 >> here 9.6 today. Follow closely. Denies bleeding issue  4. HLD - Statin intolerance. Continue Zetia. Consider PCSK9 inhibitor as outpatient.   For questions or updates, please contact [place] Please consult [email or web address] for contact info under      SignedLeanor Kail, PA  03/18/2018, 8:36 AM    Patient seen, examined. Available data reviewed. Agree with findings, assessment, and plan as outlined by Robbie Lis, PA-C.  The patient is independently interviewed and examined.  On exam he is alert and oriented in no distress, heart is regular rate and rhythm with no murmur gallop, lungs are clear, abdomen soft nontender, extremities show no edema, skin is warm dry without rash.  He is chest pain-free since admission.  He is maintaining sinus rhythm now on oral amiodarone.  Eliquis is on hold for cardiac catheterization tomorrow.  Await cardiac catheterization for further disposition.  Sherren Mocha, M.D. 03/18/2018 11:59  AM

## 2018-03-18 NOTE — Plan of Care (Signed)

## 2018-03-18 NOTE — Progress Notes (Signed)
[place] for Heparin Indication: chest pain/ACS  Allergies  Allergen Reactions  . Oxycodone Nausea Only  . Statins Other (See Comments)    Muscle and Bone pain Muscle and Bone pain    Patient Measurements: Height: 5\' 9"  (175.3 cm) Weight: 178 lb 4.8 oz (80.9 kg) IBW/kg (Calculated) : 70.7   Vital Signs: Temp: 97.9 F (36.6 C) (09/26 1315) Temp Source: Oral (09/26 1315) BP: 105/44 (09/26 1315) Pulse Rate: 64 (09/26 1315)  Labs: Recent Labs    03/17/18 1909 03/18/18 0423 03/18/18 1429  HGB  --  9.6*  --   HCT  --  30.8*  --   PLT  --  182  --   APTT 40* 56* 50*  HEPARINUNFRC >2.20* >2.20*  --   CREATININE  --  1.21  --     Estimated Creatinine Clearance: 51.1 mL/min (by C-G formula based on SCr of 1.21 mg/dL).   Medical History: Past Medical History:  Diagnosis Date  . Arthritis    "knees, elbows" (03/18/2018)  . Chronic edema    a. Chronic RLE edema.  Marland Kitchen COPD (chronic obstructive pulmonary disease) ([place])   . Coronary artery disease    a. MI s/p balloon 1996, details unclear.  Marland Kitchen Dysrhythmia    PROXIMAL MARGIN FIBRILATION  . GERD (gastroesophageal reflux disease)   . Hyperlipidemia   . Hypertension   . Myocardial infarction ([place]) 1996  . PAD (peripheral artery disease) ([place])    a. s/p stenting 08/2012, 02/2013.   Marland Kitchen PAF (paroxysmal atrial fibrillation) ([place])   . Sleep apnea    "have mask; have to start wearing it" (03/18/2018)  . Stroke [facility]) ~ 2014   denies residual on 03/18/2018  . Thrombosis of lower extremity    a. Listed on patient's medical bracelet - at [place].  Marland Kitchen Type II diabetes mellitus (HCC)     Medications:  Medications Prior to Admission  Medication Sig Dispense Refill Last Dose  . acetaminophen (TYLENOL) 500 MG tablet Take 500 mg by mouth 2 (two) times daily as needed (for pain or headaches).    unk at prn  . apixaban (ELIQUIS) 5 MG TABS tablet Take 5 mg by mouth 2 (two) times daily.   unk  .  clopidogrel (PLAVIX) 75 MG tablet Take 1 tablet (75 mg total) by mouth daily. 90 tablet 1 unk  . diclofenac sodium (VOLTAREN) 1 % GEL Apply 2 g topically See admin instructions. Apply 2 grams to affected areas three times a day as directed   unk at prn  . diltiazem (DILACOR XR) 180 MG 24 hr capsule Take 180 mg by mouth daily.   unk  . ezetimibe (ZETIA) 10 MG tablet Take 5 mg by mouth daily.   unk  . hydrochlorothiazide (MICROZIDE) 12.5 MG capsule Take 12.5 mg by mouth daily.    unk  . memantine (NAMENDA) 10 MG tablet Take 10 mg by mouth 2 (two) times daily.   unk  . metoprolol (LOPRESSOR) 50 MG tablet Take 50 mg by mouth 2 (two) times daily.   unk  . nitroGLYCERIN (NITROSTAT) 0.4 MG SL tablet Place 0.4 mg under the tongue every 5 (five) minutes as needed for chest pain.   unk at prn  . pantoprazole (PROTONIX) 40 MG tablet Take 40 mg by mouth daily.   unk  . ramipril (ALTACE) 10 MG capsule Take 10 mg by mouth 2 (two) times daily.   unk  . rivastigmine (EXELON) 6 MG  capsule Take 6 mg by mouth 2 (two) times daily.   unk  . oxyCODONE (OXY IR/ROXICODONE) 5 MG immediate release tablet Take 1 tablet (5 mg total) by mouth every 6 (six) hours as needed for moderate pain. (Patient not taking: Reported on 02/01/2018) 20 tablet 0 Not Taking at Unknown time   Scheduled:  . amiodarone  400 mg Oral BID  . aspirin EC  81 mg Oral QHS  . clopidogrel  75 mg Oral Daily  . diltiazem  180 mg Oral Daily  . ezetimibe  5 mg Oral Daily  . memantine  10 mg Oral BID  . metoprolol tartrate  50 mg Oral BID  . multivitamin with minerals  1 tablet Oral Daily  . pantoprazole  40 mg Oral Daily  . ramipril  10 mg Oral BID  . rivastigmine  6 mg Oral BID  . tamsulosin  0.4 mg Oral QHS    Assessment: 77 yo male from [facility] with CP and afib/RVR. He is noted on apixaban for afib with last dose on 9/25 AM; although, the timing is not clear. Pharmacy consulted to dose heparin (was not on heparin upon transfer here).  Plans noted for cath.  APTT subtherapeutic at 50, on 1000 units/hr. Hgb down today (reported 12.1>10.4 at OSH) -now 9.6, plt 182. No s/sx of bleeding. Of note, heparin infusion was stopped at 1347 due to loss of IV access. IV team to place new line.  Goal of Therapy:  APTT 66-102 secs Heparin level 0.3-0.7 units/ml Monitor platelets by anticoagulation protocol: Yes   Plan:  -Continue heparin infusion at same rate and recheck aPTT 8 hours after new line placed -Monitor daily HL, aPTT, and for s/sx of bleeding  Doylene Canard, PharmD Clinical Pharmacist  Pager: [phone] Phone: [phone]

## 2018-03-19 ENCOUNTER — Encounter (HOSPITAL_COMMUNITY): Payer: Self-pay | Admitting: Cardiology

## 2018-03-19 ENCOUNTER — Encounter (HOSPITAL_COMMUNITY)
Admission: AD | Disposition: A | Discharge status: Home / Self Care | Payer: Self-pay | Source: Other Acute Inpatient Hospital | Attending: Cardiovascular Disease

## 2018-03-19 DIAGNOSIS — R748 Abnormal levels of other serum enzymes: Secondary | ICD-10-CM

## 2018-03-19 DIAGNOSIS — I2510 Atherosclerotic heart disease of native coronary artery without angina pectoris: Secondary | ICD-10-CM

## 2018-03-19 HISTORY — PX: LEFT HEART CATH AND CORONARY ANGIOGRAPHY: CATH118249

## 2018-03-19 LAB — CBC
HCT: 31.7 % — ABNORMAL LOW (ref 39.0–52.0)
Hemoglobin: 10 g/dL — ABNORMAL LOW (ref 13.0–17.0)
MCH: 29.7 pg (ref 26.0–34.0)
MCHC: 31.5 g/dL (ref 30.0–36.0)
MCV: 94.1 fL (ref 78.0–100.0)
PLATELETS: 204 10*3/uL (ref 150–400)
RBC: 3.37 MIL/uL — ABNORMAL LOW (ref 4.22–5.81)
RDW: 12.5 % (ref 11.5–15.5)
WBC: 5.9 10*3/uL (ref 4.0–10.5)

## 2018-03-19 LAB — APTT
APTT: 83 s — AB (ref 24–36)
APTT: 94 s — AB (ref 24–36)

## 2018-03-19 SURGERY — LEFT HEART CATH AND CORONARY ANGIOGRAPHY
Anesthesia: LOCAL

## 2018-03-19 MED ORDER — FENTANYL CITRATE (PF) 100 MCG/2ML IJ SOLN
INTRAMUSCULAR | Status: DC | PRN
  Administered 2018-03-19: 25 ug via INTRAVENOUS

## 2018-03-19 MED ORDER — MIDAZOLAM HCL 2 MG/2ML IJ SOLN
INTRAMUSCULAR | Status: AC
  Filled 2018-03-19: qty 2

## 2018-03-19 MED ORDER — LIDOCAINE HCL (PF) 1 % IJ SOLN
INTRAMUSCULAR | Status: AC
  Filled 2018-03-19: qty 30

## 2018-03-19 MED ORDER — HEPARIN (PORCINE) IN NACL 1000-0.9 UT/500ML-% IV SOLN
INTRAVENOUS | Status: AC
  Filled 2018-03-19: qty 1000

## 2018-03-19 MED ORDER — APIXABAN 5 MG PO TABS: 5.0000 mg | ORAL_TABLET | Freq: Two times a day (BID) | ORAL | Status: DC

## 2018-03-19 MED ORDER — SODIUM CHLORIDE 0.9 % WEIGHT BASED INFUSION: 1.0000 mL/kg/h | INTRAVENOUS | Status: DC

## 2018-03-19 MED ORDER — LIDOCAINE HCL (PF) 1 % IJ SOLN
INTRAMUSCULAR | Status: DC | PRN
  Administered 2018-03-19: 5 mL via SUBCUTANEOUS

## 2018-03-19 MED ORDER — AMIODARONE HCL 200 MG PO TABS: 200.0000 mg | ORAL_TABLET | Freq: Every day | ORAL | Status: DC

## 2018-03-19 MED ORDER — AMIODARONE HCL 400 MG PO TABS: 400.0000 mg | ORAL_TABLET | Freq: Two times a day (BID) | ORAL | 0 refills | Status: DC

## 2018-03-19 MED ORDER — AMIODARONE HCL 200 MG PO TABS: 200.0000 mg | ORAL_TABLET | Freq: Every day | ORAL | 1 refills | Status: DC

## 2018-03-19 MED ORDER — IOHEXOL 350 MG/ML SOLN
INTRAVENOUS | Status: DC | PRN
  Administered 2018-03-19: 110 mL via INTRA_ARTERIAL

## 2018-03-19 MED ORDER — MIDAZOLAM HCL 2 MG/2ML IJ SOLN
INTRAMUSCULAR | Status: DC | PRN
  Administered 2018-03-19: 1 mg via INTRAVENOUS

## 2018-03-19 MED ORDER — VERAPAMIL HCL 2.5 MG/ML IV SOLN
INTRAVENOUS | Status: DC | PRN
  Administered 2018-03-19: 10 mL via INTRA_ARTERIAL

## 2018-03-19 MED ORDER — HEPARIN (PORCINE) IN NACL 1000-0.9 UT/500ML-% IV SOLN
INTRAVENOUS | Status: DC | PRN
  Administered 2018-03-19 (×2): 500 mL

## 2018-03-19 MED ORDER — HEPARIN SODIUM (PORCINE) 1000 UNIT/ML IJ SOLN
INTRAMUSCULAR | Status: DC | PRN
  Administered 2018-03-19: 4000 [IU] via INTRAVENOUS

## 2018-03-19 MED ORDER — SODIUM CHLORIDE 0.9% FLUSH: 3.0000 mL | INTRAVENOUS | Status: DC | PRN

## 2018-03-19 MED ORDER — SODIUM CHLORIDE 0.9% FLUSH: 3.0000 mL | Freq: Two times a day (BID) | INTRAVENOUS | Status: DC

## 2018-03-19 MED ORDER — SODIUM CHLORIDE 0.9 % IV SOLN: 250.0000 mL | INTRAVENOUS | Status: DC | PRN

## 2018-03-19 MED ORDER — VERAPAMIL HCL 2.5 MG/ML IV SOLN
INTRAVENOUS | Status: AC
  Filled 2018-03-19: qty 2

## 2018-03-19 MED ORDER — FENTANYL CITRATE (PF) 100 MCG/2ML IJ SOLN
INTRAMUSCULAR | Status: AC
  Filled 2018-03-19: qty 2

## 2018-03-19 MED ORDER — ASPIRIN 81 MG PO CHEW
81.0000 mg | CHEWABLE_TABLET | Freq: Once | ORAL | Status: AC
  Administered 2018-03-19: 81 mg via ORAL
  Filled 2018-03-19: qty 1

## 2018-03-19 SURGICAL SUPPLY — 15 items
CATH 5FR JL3.5 JR4 ANG PIG MP (CATHETERS) ×2 IMPLANT
CATH INFINITI 5 FR 3DRC (CATHETERS) ×2 IMPLANT
CATH INFINITI 5 FR AR1 MOD (CATHETERS) ×2 IMPLANT
CATH INFINITI 5FR AL1 (CATHETERS) ×2 IMPLANT
CATH LAUNCHER 5F RADR (CATHETERS) ×1 IMPLANT
CATHETER LAUNCHER 5F RADR (CATHETERS) ×2
DEVICE RAD COMP TR BAND LRG (VASCULAR PRODUCTS) ×2 IMPLANT
GLIDESHEATH SLEND SS 6F .021 (SHEATH) ×2 IMPLANT
GUIDEWIRE INQWIRE 1.5J.035X260 (WIRE) ×1 IMPLANT
INQWIRE 1.5J .035X260CM (WIRE) ×2
KIT HEART LEFT (KITS) ×2 IMPLANT
PACK CARDIAC CATHETERIZATION (CUSTOM PROCEDURE TRAY) ×2 IMPLANT
SYR MEDRAD MARK V 150ML (SYRINGE) ×2 IMPLANT
TRANSDUCER W/STOPCOCK (MISCELLANEOUS) ×2 IMPLANT
TUBING CIL FLEX 10 FLL-RA (TUBING) ×2 IMPLANT

## 2018-03-19 NOTE — Interval H&P Note (Signed)
History and Physical Interval Note:  03/19/2018 8:52 AM  Donald Gordon  has presented today for surgery, with the diagnosis of cp  The various methods of treatment have been discussed with the patient and family. After consideration of risks, benefits and other options for treatment, the patient has consented to  Procedure(s): LEFT HEART CATH AND CORONARY ANGIOGRAPHY (N/A) as a surgical intervention .  The patient's history has been reviewed, patient examined, no change in status, stable for surgery.  I have reviewed the patient's chart and labs.  Questions were answered to the patient's satisfaction.   Cath Lab Visit (complete for each Cath Lab visit)  Clinical Evaluation Leading to the Procedure:   ACS: Yes.    Non-ACS:    Anginal Classification: CCS IV  Anti-ischemic medical therapy: Maximal Therapy (2 or more classes of medications)  Non-Invasive Test Results: Intermediate-risk stress test findings: cardiac mortality 1-3%/year  Prior CABG: No previous CABG        Donald Gordon 03/19/2018 8:52 AM

## 2018-03-19 NOTE — Progress Notes (Signed)
[place] for Heparin Indication: chest pain/ACS  Allergies  Allergen Reactions  . Oxycodone Nausea Only  . Statins Other (See Comments)    Muscle and Bone pain Muscle and Bone pain    Patient Measurements: Height: 5\' 9"  (175.3 cm) Weight: 178 lb 4.8 oz (80.9 kg) IBW/kg (Calculated) : 70.7   Vital Signs: Temp: 98.2 F (36.8 C) (09/26 2350) Temp Source: Oral (09/26 2350) BP: 105/48 (09/26 2350) Pulse Rate: 60 (09/26 2350)  Labs: Recent Labs    03/17/18 1909 03/18/18 0423 03/18/18 1429 03/19/18 0002  HGB  --  9.6*  --  10.0*  HCT  --  30.8*  --  31.7*  PLT  --  182  --  204  APTT 40* 56* 50* 94*  HEPARINUNFRC >2.20* >2.20* 1.68*  --   CREATININE  --  1.21  --   --     Estimated Creatinine Clearance: 51.1 mL/min (by C-G formula based on SCr of 1.21 mg/dL).   Medical History: Past Medical History:  Diagnosis Date  . Arthritis    "knees, elbows" (03/18/2018)  . Chronic edema    a. Chronic RLE edema.  Marland Kitchen COPD (chronic obstructive pulmonary disease) ([place])   . Coronary artery disease    a. MI s/p balloon 1996, details unclear.  Marland Kitchen Dysrhythmia    PROXIMAL MARGIN FIBRILATION  . GERD (gastroesophageal reflux disease)   . Hyperlipidemia   . Hypertension   . Myocardial infarction ([place]) 1996  . PAD (peripheral artery disease) ([place])    a. s/p stenting 08/2012, 02/2013.   Marland Kitchen PAF (paroxysmal atrial fibrillation) ([place])   . Sleep apnea    "have mask; have to start wearing it" (03/18/2018)  . Stroke [facility]) ~ 2014   denies residual on 03/18/2018  . Thrombosis of lower extremity    a. Listed on patient's medical bracelet - at [place].  Marland Kitchen Type II diabetes mellitus (HCC)     Medications:  Medications Prior to Admission  Medication Sig Dispense Refill Last Dose  . acetaminophen (TYLENOL) 500 MG tablet Take 500 mg by mouth 2 (two) times daily as needed (for pain or headaches).    unk at prn  . apixaban (ELIQUIS) 5 MG TABS tablet Take 5 mg by  mouth 2 (two) times daily.   unk  . clopidogrel (PLAVIX) 75 MG tablet Take 1 tablet (75 mg total) by mouth daily. 90 tablet 1 unk  . diclofenac sodium (VOLTAREN) 1 % GEL Apply 2 g topically See admin instructions. Apply 2 grams to affected areas three times a day as directed   unk at prn  . diltiazem (DILACOR XR) 180 MG 24 hr capsule Take 180 mg by mouth daily.   unk  . ezetimibe (ZETIA) 10 MG tablet Take 5 mg by mouth daily.   unk  . hydrochlorothiazide (MICROZIDE) 12.5 MG capsule Take 12.5 mg by mouth daily.    unk  . memantine (NAMENDA) 10 MG tablet Take 10 mg by mouth 2 (two) times daily.   unk  . metoprolol (LOPRESSOR) 50 MG tablet Take 50 mg by mouth 2 (two) times daily.   unk  . nitroGLYCERIN (NITROSTAT) 0.4 MG SL tablet Place 0.4 mg under the tongue every 5 (five) minutes as needed for chest pain.   unk at prn  . pantoprazole (PROTONIX) 40 MG tablet Take 40 mg by mouth daily.   unk  . ramipril (ALTACE) 10 MG capsule Take 10 mg by mouth 2 (two) times  daily.   unk  . rivastigmine (EXELON) 6 MG capsule Take 6 mg by mouth 2 (two) times daily.   unk  . oxyCODONE (OXY IR/ROXICODONE) 5 MG immediate release tablet Take 1 tablet (5 mg total) by mouth every 6 (six) hours as needed for moderate pain. (Patient not taking: Reported on 02/01/2018) 20 tablet 0 Not Taking at Unknown time   Scheduled:  . amiodarone  400 mg Oral BID  . aspirin EC  81 mg Oral QHS  . clopidogrel  75 mg Oral Daily  . diltiazem  180 mg Oral Daily  . ezetimibe  5 mg Oral Daily  . memantine  10 mg Oral BID  . metoprolol tartrate  50 mg Oral BID  . multivitamin with minerals  1 tablet Oral Daily  . pantoprazole  40 mg Oral Daily  . ramipril  10 mg Oral BID  . rivastigmine  6 mg Oral BID  . sodium chloride flush  3 mL Intravenous Q12H  . tamsulosin  0.4 mg Oral QHS    Assessment: 77 yo male from [facility] with CP and afib/RVR. He is noted on apixaban for afib with last dose this morning although the timing is not  clear. Pharmacy consulted to dose heparin (was not on heparin upon transfer here). Plans noted for cath.  9/26 AM update: using aPTT to dose for now given apixaban influence on anti-Xa levels, aPTT is therapeutic this AM after re-starting due to loss of access   Goal of Therapy:  APTT 66-102 secs Heparin level 0.3-0.7 units/ml Monitor platelets by anticoagulation protocol: Yes   Plan:  Cont heparin at 1000 units/hr 0900 aPTT Cath 9/27  Narda Bonds, PharmD, [place] Clinical Pharmacist Phone: [phone]

## 2018-03-19 NOTE — Progress Notes (Signed)
Progress Note  Patient Name: Donald Gordon Date of Encounter: 03/19/2018  Primary Cardiologist: No primary care provider on file.   Subjective   Feels well, no chest pain or dyspnea.  Had cardiac catheterization this morning.  Inpatient Medications    Scheduled Meds: . [START ON 04/01/2018] amiodarone  200 mg Oral Daily  . amiodarone  400 mg Oral BID  . apixaban  5 mg Oral BID  . clopidogrel  75 mg Oral Daily  . ezetimibe  5 mg Oral Daily  . memantine  10 mg Oral BID  . metoprolol tartrate  50 mg Oral BID  . multivitamin with minerals  1 tablet Oral Daily  . pantoprazole  40 mg Oral Daily  . ramipril  10 mg Oral BID  . rivastigmine  6 mg Oral BID  . sodium chloride flush  3 mL Intravenous Q12H  . tamsulosin  0.4 mg Oral QHS   Continuous Infusions: . sodium chloride    . sodium chloride     PRN Meds: sodium chloride, acetaminophen, nitroGLYCERIN, ondansetron (ZOFRAN) IV, sodium chloride flush   Vital Signs    Vitals:   03/19/18 0936 03/19/18 0940 03/19/18 0944 03/19/18 1218  BP: (!) 158/118 124/61 (!) 151/68 (!) 155/61  Pulse: (!) 56 67 (!) 59 (!) 57  Resp: 13 (!) 23 11   Temp:      TempSrc:      SpO2: 98% 98% 98% 96%  Weight:      Height:        Intake/Output Summary (Last 24 hours) at 03/19/2018 1315 Last data filed at 03/19/2018 0700 Gross per 24 hour  Intake 799.18 ml  Output 650 ml  Net 149.18 ml   Filed Weights   03/17/18 1609 03/18/18 0449 03/19/18 0409  Weight: 80.5 kg 80.9 kg 80.9 kg    Telemetry    Normal sinus rhythm, no recurrence of atrial fibrillation - Personally Reviewed   Physical Exam  Alert, oriented male in no distress GEN: No acute distress.   Neck: No JVD Cardiac: RRR, no murmurs, rubs, or gallops.  Respiratory: Clear to auscultation bilaterally. GI: Soft, nontender, non-distended  MS: No edema; No deformity.  TR band in place over the right wrist Neuro:  Nonfocal  Psych: Normal affect   Labs    Chemistry Recent  Labs  Lab 03/18/18 0423  NA 138  K 4.0  CL 108  CO2 21*  GLUCOSE 99  BUN 16  CREATININE 1.21  CALCIUM 8.2*  GFRNONAA 56*  GFRAA >60  ANIONGAP 9     Hematology Recent Labs  Lab 03/18/18 0423 03/19/18 0002  WBC 6.6 5.9  RBC 3.25* 3.37*  HGB 9.6* 10.0*  HCT 30.8* 31.7*  MCV 94.8 94.1  MCH 29.5 29.7  MCHC 31.2 31.5  RDW 12.8 12.5  PLT 182 204    Cardiac EnzymesNo results for input(s): TROPONINI in the last 168 hours. No results for input(s): TROPIPOC in the last 168 hours.   BNPNo results for input(s): BNP, PROBNP in the last 168 hours.   DDimer No results for input(s): DDIMER in the last 168 hours.   Radiology    No results found.  Cardiac Studies   Cardiac Cath: Conclusion     Prox RCA lesion is 40% stenosed.  Dist RCA lesion is 35% stenosed.  Prox LAD to Mid LAD lesion is 30% stenosed.  Prox Cx to Mid Cx lesion is 30% stenosed.  The left ventricular systolic function is normal.  LV end diastolic pressure is mildly elevated.  The left ventricular ejection fraction is 55-65% by visual estimate.   1. Nonobstructive CAD 2. Normal LV function 3. Mildly elevated LVEDP  Plan: medical therapy focused on control of Afib. Will stop ASA. Continue Plavix and resume Eliquis this pm  Recommend to resume Apixaban, at currently prescribed dose and frequency, on 03/19/18.  Recommend concurrent antiplatelet therapy of Clopidogrel 75mg  daily for 12 months.     Patient Profile     77 y.o. male with a hx of CAD (s/p PTCA 1996) PAF, HTN, HLD, DM, syncope, PAD with recent popliteal thrombectomy with bypass grafting and fasciotomy by Dr. Bridgett Larsson 12/2017 transferred from [facility] with atrial fibrillation with RVR complicated by chest pain, marked ST depression on EKG.   Assessment & Plan    1.  Non-STEMI: Likely related to atrial fibrillation with RVR.  Cardiac catheterization reviewed and demonstrated nonobstructive coronary artery disease.  Aspirin will  be discontinued.  The patient is statin intolerant.  He is treated with Zetia.  2.  Paroxysmal atrial fibrillation: He should continue oral amiodarone loading at a dose of 400 mg twice daily for 2 weeks.  We will continue him on a beta-blocker.  We will discontinue diltiazem since he is being loaded with amiodarone.  Resume anticoagulation with apixaban tonight  3. Hyperlipidemia: statin intolerant, treated with Zetia. Consider PCSK9 inhibitor as outpatient.   Disposition: The patient is stable for discharge from the hospital.  Will arrange outpatient follow-up.  Medication recommendations and post cardiac catheterization restrictions are reviewed with the patient and his daughter today.  For questions or updates, please contact [place] Please consult [email or web address] for contact info under      Signed, Sherren Mocha, MD  03/19/2018, 1:15 PM

## 2018-03-19 NOTE — Discharge Summary (Addendum)
Discharge Summary    Patient ID: KRISTAN VOTTA MRN: [id number]; DOB: 10-24-1940  Admit date: 03/17/2018 Discharge date: 03/19/2018  Primary Care Provider: [facility]  Primary Cardiologist: [facility] Primary Electrophysiologist:  Dr. Lovena Le  Discharge Diagnoses    Principal Problem:   Atrial fibrillation with rapid ventricular response ([place]) Active Problems:   Paroxysmal atrial fibrillation - with RVR   Elevated troponin   Essential hypertension   CAD (coronary artery disease)   Hyperlipemia   Diabetes mellitus type 2, controlled (HCC)   Allergies Allergies  Allergen Reactions  . Oxycodone Nausea Only  . Statins Other (See Comments)    Muscle and Bone pain Muscle and Bone pain    Diagnostic Studies/Procedures    Cath 03/19/2018  Prox RCA lesion is 40% stenosed.  Dist RCA lesion is 35% stenosed.  Prox LAD to Mid LAD lesion is 30% stenosed.  Prox Cx to Mid Cx lesion is 30% stenosed.  The left ventricular systolic function is normal.  LV end diastolic pressure is mildly elevated.  The left ventricular ejection fraction is 55-65% by visual estimate.   1. Nonobstructive CAD 2. Normal LV function 3. Mildly elevated LVEDP  Plan: medical therapy focused on control of Afib. Will stop ASA. Continue Plavix and resume Eliquis this pm  Recommend to resume Apixaban, at currently prescribed dose and frequency, on 03/19/18.  Recommend concurrent antiplatelet therapy of Clopidogrel 75mg  daily for 12 months.  Diagnostic Diagram       _____________   History of Present Illness     Mr. Desroches he is here with his daughter.  He has just arrived in transfer from [facility].  The patient was hospitalized with chest pain after calling EMS about 48 hours ago.  He was found to be in atrial fibrillation with RVR and was treated with IV diltiazem and oral metoprolol.  The patient is been anticoagulated with apixaban and he is also treated with clopidogrel because  of his vascular disease.  He spontaneously converted to sinus rhythm but has been in and out of atrial fibrillation over the last 24 hours.  He underwent a Lexiscan Myoview stress test today with report of ischemic EKG changes.  The perfusion images are currently pending.  Following his stress test he went back into atrial fibrillation.  At the time of my evaluation he is in normal sinus rhythm and he is not experiencing any chest pain at rest.  The patient has been previously active.  He works frequently in the yard.  He has had a slow recovery since undergoing extensive vascular surgery in July 2019 when he was treated with revascularization surgery and fasciotomies.  Over the last 3 to 4 days he complains of worsening fatigue and weakness.  He is also had episodic shortness of breath and chest discomfort that he describes as a pressure-like sensation in the center of his chest.  He feels like he is been having more frequent atrial fibrillation.  Hospital Course      Patient was admitted to cardiology service.  Troponin was elevated in the setting of atrial fibrillation with RVR.  Since patient had abnormal stress test, cardiac catheterization was scheduled.  He underwent Eliquis washout for 24 hours prior to her cath.  By the time he arrived in the hospital, he is in sinus rhythm with frequent bursts of atrial fibrillation.  Antiarrhythmic drug was indicated, however considering his comorbid conditions, amiodarone was felt to be the best option.  He was started on  400 mg twice daily of amiodarone.  He will continue beta-blocker.  Given the addition of amiodarone, his diltiazem will be discontinued.  He eventually underwent cardiac catheterization on 03/19/2018 which showed a 40% proximal RCA lesion, 35% distal RCA lesion, 30% proximal to mid LAD lesion, 30% proximal to mid left circumflex lesion, EF 55 to 65%.  Mildly elevated LVEDP.  Aspirin was stopped after the procedure.  Patient will continue Plavix  and resume his Eliquis this afternoon.  He was seen post-cath and appears to be stable.  He is deemed stable for discharge from cardiology perspective.  I have discussed with the patient, he wish to followup with his cardiologist at [facility]. Ideally, he will continue amiodarone at 400 mg BID for 2 weeks, then go down to 200mg  BID for 1 month, then 200mg  daily thereafter. Will need TSH and Liver Function Test to make sure no significant toxicity symptom on amiodarone. Will need annual eye check.    _____________  Discharge Vitals Blood pressure (!) 155/61, pulse (!) 57, temperature 97.6 F (36.4 C), temperature source Oral, resp. rate 11, height 5\' 9"  (1.753 m), weight 80.9 kg, SpO2 96 %.  Filed Weights   03/17/18 1609 03/18/18 0449 03/19/18 0409  Weight: 80.5 kg 80.9 kg 80.9 kg    Labs & Radiologic Studies    CBC Recent Labs    03/18/18 0423 03/19/18 0002  WBC 6.6 5.9  HGB 9.6* 10.0*  HCT 30.8* 31.7*  MCV 94.8 94.1  PLT 182 308   Basic Metabolic Panel Recent Labs    03/18/18 0423  NA 138  K 4.0  CL 108  CO2 21*  GLUCOSE 99  BUN 16  CREATININE 1.21  CALCIUM 8.2*   Liver Function Tests No results for input(s): AST, ALT, ALKPHOS, BILITOT, PROT, ALBUMIN in the last 72 hours. No results for input(s): LIPASE, AMYLASE in the last 72 hours. Cardiac Enzymes No results for input(s): CKTOTAL, CKMB, CKMBINDEX, TROPONINI in the last 72 hours. BNP Invalid input(s): POCBNP D-Dimer No results for input(s): DDIMER in the last 72 hours. Hemoglobin A1C No results for input(s): HGBA1C in the last 72 hours. Fasting Lipid Panel No results for input(s): CHOL, HDL, LDLCALC, TRIG, CHOLHDL, LDLDIRECT in the last 72 hours. Thyroid Function Tests No results for input(s): TSH, T4TOTAL, T3FREE, THYROIDAB in the last 72 hours.  Invalid input(s): FREET3 _____________  No results found. Disposition   Pt is being discharged home today in good condition.  Follow-up Plans & Appointments      Discharge Instructions    Diet - low sodium heart healthy   Complete by:  As directed    Increase activity slowly   Complete by:  As directed       Discharge Medications   Allergies as of 03/19/2018      Reactions   Oxycodone Nausea Only   Statins Other (See Comments)   Muscle and Bone pain Muscle and Bone pain      Medication List    STOP taking these medications   diltiazem 180 MG 24 hr capsule Commonly known as:  DILACOR XR   hydrochlorothiazide 12.5 MG capsule Commonly known as:  MICROZIDE     TAKE these medications   acetaminophen 500 MG tablet Commonly known as:  TYLENOL Take 500 mg by mouth 2 (two) times daily as needed (for pain or headaches).   amiodarone 400 MG tablet Commonly known as:  PACERONE Take 1 tablet (400 mg total) by mouth 2 (two) times daily.  amiodarone 200 MG tablet Commonly known as:  PACERONE Take 1 tablet (200 mg total) by mouth daily. Start taking on:  04/01/2018   clopidogrel 75 MG tablet Commonly known as:  PLAVIX Take 1 tablet (75 mg total) by mouth daily.   ELIQUIS 5 MG Tabs tablet Generic drug:  apixaban Take 5 mg by mouth 2 (two) times daily.   ezetimibe 10 MG tablet Commonly known as:  ZETIA Take 5 mg by mouth daily.   memantine 10 MG tablet Commonly known as:  NAMENDA Take 10 mg by mouth 2 (two) times daily.   metoprolol tartrate 50 MG tablet Commonly known as:  LOPRESSOR Take 50 mg by mouth 2 (two) times daily.   nitroGLYCERIN 0.4 MG SL tablet Commonly known as:  NITROSTAT Place 0.4 mg under the tongue every 5 (five) minutes as needed for chest pain.   oxyCODONE 5 MG immediate release tablet Commonly known as:  Oxy IR/ROXICODONE Take 1 tablet (5 mg total) by mouth every 6 (six) hours as needed for moderate pain.   pantoprazole 40 MG tablet Commonly known as:  PROTONIX Take 40 mg by mouth daily.   ramipril 10 MG capsule Commonly known as:  ALTACE Take 10 mg by mouth 2 (two) times daily.    rivastigmine 6 MG capsule Commonly known as:  EXELON Take 6 mg by mouth 2 (two) times daily.   VOLTAREN 1 % Gel Generic drug:  diclofenac sodium Apply 2 g topically See admin instructions. Apply 2 grams to affected areas three times a day as directed        Acute coronary syndrome (MI, NSTEMI, STEMI, etc) this admission?:  No.  The elevated Troponin was due to the acute medical illness or demand ischemia.    Outstanding Labs/Studies   TSH and LFT   Duration of Discharge Encounter   Greater than 30 minutes including physician time.  Hilbert Corrigan, PA 03/19/2018, 2:50 PM

## 2018-03-19 NOTE — Progress Notes (Signed)
[facility] for Heparin Indication: chest pain/ACS  Allergies  Allergen Reactions  . Oxycodone Nausea Only  . Statins Other (See Comments)    Muscle and Bone pain Muscle and Bone pain    Patient Measurements: Height: 5\' 9"  (175.3 cm) Weight: 178 lb 5.6 oz (80.9 kg) IBW/kg (Calculated) : 70.7   Vital Signs: Temp: 97.6 F (36.4 C) (09/27 0804) Temp Source: Oral (09/27 0804) BP: 151/68 (09/27 0944) Pulse Rate: 59 (09/27 0944)  Labs: Recent Labs    03/17/18 1909 03/18/18 0423 03/18/18 1429 03/19/18 0002 03/19/18 0838  HGB  --  9.6*  --  10.0*  --   HCT  --  30.8*  --  31.7*  --   PLT  --  182  --  204  --   APTT 40* 56* 50* 94* 83*  HEPARINUNFRC >2.20* >2.20* 1.68*  --   --   CREATININE  --  1.21  --   --   --     Estimated Creatinine Clearance: 51.1 mL/min (by C-G formula based on SCr of 1.21 mg/dL).   Medical History: Past Medical History:  Diagnosis Date  . Arthritis    "knees, elbows" (03/18/2018)  . Chronic edema    a. Chronic RLE edema.  Marland Kitchen COPD (chronic obstructive pulmonary disease) ([place])   . Coronary artery disease    a. MI s/p balloon 1996, details unclear.  Marland Kitchen Dysrhythmia    PROXIMAL MARGIN FIBRILATION  . GERD (gastroesophageal reflux disease)   . Hyperlipidemia   . Hypertension   . Myocardial infarction ([place]) 1996  . PAD (peripheral artery disease) ([place])    a. s/p stenting 08/2012, 02/2013.   Marland Kitchen PAF (paroxysmal atrial fibrillation) ([place])   . Sleep apnea    "have mask; have to start wearing it" (03/18/2018)  . Stroke [facility]) ~ 2014   denies residual on 03/18/2018  . Thrombosis of lower extremity    a. Listed on patient's medical bracelet - at [place].  Marland Kitchen Type II diabetes mellitus (HCC)     Medications:  Medications Prior to Admission  Medication Sig Dispense Refill Last Dose  . acetaminophen (TYLENOL) 500 MG tablet Take 500 mg by mouth 2 (two) times daily as needed (for pain or headaches).    unk at prn  .  apixaban (ELIQUIS) 5 MG TABS tablet Take 5 mg by mouth 2 (two) times daily.   unk  . clopidogrel (PLAVIX) 75 MG tablet Take 1 tablet (75 mg total) by mouth daily. 90 tablet 1 unk  . diclofenac sodium (VOLTAREN) 1 % GEL Apply 2 g topically See admin instructions. Apply 2 grams to affected areas three times a day as directed   unk at prn  . diltiazem (DILACOR XR) 180 MG 24 hr capsule Take 180 mg by mouth daily.   unk  . ezetimibe (ZETIA) 10 MG tablet Take 5 mg by mouth daily.   unk  . hydrochlorothiazide (MICROZIDE) 12.5 MG capsule Take 12.5 mg by mouth daily.    unk  . memantine (NAMENDA) 10 MG tablet Take 10 mg by mouth 2 (two) times daily.   unk  . metoprolol (LOPRESSOR) 50 MG tablet Take 50 mg by mouth 2 (two) times daily.   unk  . nitroGLYCERIN (NITROSTAT) 0.4 MG SL tablet Place 0.4 mg under the tongue every 5 (five) minutes as needed for chest pain.   unk at prn  . pantoprazole (PROTONIX) 40 MG tablet Take 40 mg by mouth daily.  unk  . ramipril (ALTACE) 10 MG capsule Take 10 mg by mouth 2 (two) times daily.   unk  . rivastigmine (EXELON) 6 MG capsule Take 6 mg by mouth 2 (two) times daily.   unk  . oxyCODONE (OXY IR/ROXICODONE) 5 MG immediate release tablet Take 1 tablet (5 mg total) by mouth every 6 (six) hours as needed for moderate pain. (Patient not taking: Reported on 02/01/2018) 20 tablet 0 Not Taking at Unknown time   Scheduled:  . amiodarone  400 mg Oral BID  . aspirin EC  81 mg Oral QHS  . clopidogrel  75 mg Oral Daily  . diltiazem  180 mg Oral Daily  . ezetimibe  5 mg Oral Daily  . memantine  10 mg Oral BID  . metoprolol tartrate  50 mg Oral BID  . multivitamin with minerals  1 tablet Oral Daily  . pantoprazole  40 mg Oral Daily  . ramipril  10 mg Oral BID  . rivastigmine  6 mg Oral BID  . tamsulosin  0.4 mg Oral QHS    Assessment: 77 yo male from [facility] with CP and afib/RVR. He is noted on apixaban for afib with last dose this morning although the timing is not  clear. Pharmacy consulted to dose heparin (was not on heparin upon transfer here). Plans noted for cath.  APTT came back therapeutic at 83, on 1000 units/hr. Underwent cardiac cath showing non-obstructive CAD. Hgb 10, plt 204 - stable. No s/sx of bleeding.   Plan to restart apixaban post-cath and discontinue heparin infusion.   Thanks for allowing [place] to care for this patient,  Doylene Canard, PharmD Clinical Pharmacist  Pager: [phone] Phone: [phone]

## 2018-03-19 NOTE — Discharge Instructions (Addendum)
No lifting over 5 lbs for 1 week. Keep procedure site clean & dry. If you notice increased pain, swelling, bleeding or pus, call/return!  You may shower, but no soaking baths/hot tubs/pools for 1 week.       Information on my medicine - ELIQUIS (apixaban)  Why was Eliquis prescribed for you? Eliquis was prescribed for you to reduce the risk of a blood clot forming that can cause a stroke if you have a medical condition called atrial fibrillation (a type of irregular heartbeat).  What do You need to know about Eliquis ? Take your Eliquis TWICE DAILY - one tablet in the morning and one tablet in the evening with or without food. If you have difficulty swallowing the tablet whole please discuss with your pharmacist how to take the medication safely.  Take Eliquis exactly as prescribed by your doctor and DO NOT stop taking Eliquis without talking to the doctor who prescribed the medication.  Stopping may increase your risk of developing a stroke.  Refill your prescription before you run out.  After discharge, you should have regular check-up appointments with your healthcare provider that is prescribing your Eliquis.  In the future your dose may need to be changed if your kidney function or weight changes by a significant amount or as you get older.  What do you do if you miss a dose? If you miss a dose, take it as soon as you remember on the same day and resume taking twice daily.  Do not take more than one dose of ELIQUIS at the same time to make up a missed dose.  Important Safety Information A possible side effect of Eliquis is bleeding. You should call your healthcare provider right away if you experience any of the following: ? Bleeding from an injury or your nose that does not stop. ? Unusual colored urine (red or dark brown) or unusual colored stools (red or black). ? Unusual bruising for unknown reasons. ? A serious fall or if you hit your head (even if there is no  bleeding).  Some medicines may interact with Eliquis and might increase your risk of bleeding or clotting while on Eliquis. To help avoid this, consult your healthcare provider or pharmacist prior to using any new prescription or non-prescription medications, including herbals, vitamins, non-steroidal anti-inflammatory drugs (NSAIDs) and supplements.  This website has more information on Eliquis (apixaban): http://[email or web address].com/eliquis/home  - - - - - - - - - - - - - - - - - - - - - - - - - - - - - - - - - - - - - - - - - - - - - - - - - - - - - -  Information about your medication: Plavix (anti-platelet agent)  Generic Name (Brand): clopidogrel (Plavix), once daily medication  PURPOSE: You are taking this medication along with aspirin to lower your chance of having a heart attack, stroke, or blood clots in your heart stent. These can be fatal. Brilinta and aspirin help prevent platelets from sticking together and forming a clot that can block an artery or your stent.   Common SIDE EFFECTS you may experience include: bruising or bleeding more easily, shortness of breath  Do not stop taking PLAVIX without talking to the doctor who prescribes it for you. People who are treated with a stent and stop taking Plavix too soon, have a higher risk of getting a blood clot in the stent, having a heart attack, or dying. If  you stop Plavix because of bleeding, or for other reasons, your risk of a heart attack or stroke may increase.   Tell all of your doctors and dentists that you are taking Plavix. They should talk to the doctor who prescribed plavix for you before you have any surgery or invasive procedure.   Contact your health care provider if you experience: severe or uncontrollable bleeding, pink/red/brown urine, vomiting blood or vomit that looks like "coffee grounds", red or black stools (looks like tar), coughing up blood or blood  clots ----------------------------------------------------------------------------------------------------------------------

## 2018-03-30 ENCOUNTER — Ambulatory Visit (INDEPENDENT_AMBULATORY_CARE_PROVIDER_SITE_OTHER): Payer: Self-pay | Admitting: Vascular Surgery

## 2018-03-30 ENCOUNTER — Encounter: Payer: Self-pay | Admitting: Vascular Surgery

## 2018-03-30 VITALS — BP 124/66 | HR 92 | Temp 97.0°F | Resp 16 | Ht 69.0 in | Wt 179.0 lb

## 2018-03-30 DIAGNOSIS — I998 Other disorder of circulatory system: Secondary | ICD-10-CM

## 2018-03-30 NOTE — Progress Notes (Signed)
Patient name: Donald Gordon MRN: [id number] DOB: March 02, 1941 Sex: male  REASON FOR VISIT: 19-month follow-up status post thrombectomy of right lower extremity for acute limb ischemia  HPI: Donald Gordon is a 77 y.o. male with history of atrial fibrillation now on Eliquis that previous underwent a right lower extremity thrombectomy by Dr. Bridgett Larsson on 01/09/2018 for acute limb ischemia.  He has done well postop except for a wound in his right groin that required wet-to-dry packing with home health.  He presents for interval 73-month follow-up per Dr. Bridgett Larsson.  At this time he states his groin wound as well as his right leg compartment fasciotomy wounds have healed.  He has no complaints in his right leg including no rest pain no claudication.    Past Medical History:  Diagnosis Date  . Arthritis    "knees, elbows" (03/18/2018)  . Chronic edema    a. Chronic RLE edema.  Marland Kitchen COPD (chronic obstructive pulmonary disease) ([place])   . Coronary artery disease    a. MI s/p balloon 1996, details unclear.  Marland Kitchen Dysrhythmia    PROXIMAL MARGIN FIBRILATION  . GERD (gastroesophageal reflux disease)   . Hyperlipidemia   . Hypertension   . Myocardial infarction ([place]) 1996  . PAD (peripheral artery disease) ([place])    a. s/p stenting 08/2012, 02/2013.   Marland Kitchen PAF (paroxysmal atrial fibrillation) ([place])   . Sleep apnea    "have mask; have to start wearing it" (03/18/2018)  . Stroke [facility]) ~ 2014   denies residual on 03/18/2018  . Thrombosis of lower extremity    a. Listed on patient's medical bracelet - at [place].  Marland Kitchen Type II diabetes mellitus ([place])     Past Surgical History:  Procedure Laterality Date  . ANTERIOR LUMBAR FUSION  1981  . APPENDECTOMY    . APPLICATION OF WOUND VAC Right 01/09/2018   Procedure: APPLICATION OF WOUND VAC ON RIGHT LOWER LEG;  Surgeon: Conrad [place], MD;  Location: [place];  Service: Vascular;  Laterality: Right;  . BACK SURGERY    . CATARACT EXTRACTION W/ INTRAOCULAR LENS  IMPLANT, BILATERAL Bilateral     . CORONARY ANGIOPLASTY WITH STENT PLACEMENT     "I've got 1 stent in there" (03/18/2018)  . FASCIOTOMY Right 01/09/2018   Procedure: FASCIOTOMY RIGHT LOWER LEG;  Surgeon: Conrad [place], MD;  Location: [place];  Service: Vascular;  Laterality: Right;  . FASCIOTOMY CLOSURE Right 01/11/2018   Procedure: FASCIOTOMY CLOSURE RIGHT CALF;  Surgeon: Conrad [place], MD;  Location: [place];  Service: Vascular;  Laterality: Right;  . FEMORAL-POPLITEAL BYPASS GRAFT Right 01/09/2018   Procedure: RIGHT FEMORAL-POPLITEAL ARTERY BYPASS USING PROPATEN 6MM X 80CM VASCULAR GRAFT;  Surgeon: Conrad [place], MD;  Location: [place];  Service: Vascular;  Laterality: Right;  . HEMORRHOID SURGERY    . LEFT HEART CATH AND CORONARY ANGIOGRAPHY N/A 03/19/2018   Procedure: LEFT HEART CATH AND CORONARY ANGIOGRAPHY;  Surgeon: [place], Peter M, MD;  Location: [facility];  Service: Cardiovascular;  Laterality: N/A;  . PATCH ANGIOPLASTY Right 01/09/2018   Procedure: [place] BIOLOGIC 1CM X 6CM PATCH;  Surgeon: Conrad [place], MD;  Location: [place];  Service: Vascular;  Laterality: Right;  . POSTERIOR LUMBAR FUSION  1978  . THROMBECTOMY FEMORAL ARTERY Right 01/09/2018   Procedure: THROMBECTOMY RIGHT LOWER LEG;  Surgeon: Conrad [place], MD;  Location: [place];  Service: Vascular;  Laterality: Right;  . TONSILLECTOMY  Family History  Problem Relation Age of Onset  . CAD Mother   . CAD Brother   . Hypertension Unknown     SOCIAL HISTORY: Social History   Tobacco Use  . Smoking status: Former Smoker    Packs/day: 3.00    Years: 30.00    Pack years: 90.00    Last attempt to quit: 1996    Years since quitting: 23.7  . Smokeless tobacco: Never Used  Substance Use Topics  . Alcohol use: No    Comment: Remote alcohol use    Allergies  Allergen Reactions  . Oxycodone Nausea Only  . Statins Other (See Comments)    Muscle and Bone pain Muscle and Bone pain    Current Outpatient Medications   Medication Sig Dispense Refill  . acetaminophen (TYLENOL) 500 MG tablet Take 500 mg by mouth 2 (two) times daily as needed (for pain or headaches).     Marland Kitchen amiodarone (PACERONE) 400 MG tablet Take 1 tablet (400 mg total) by mouth 2 (two) times daily. 28 tablet 0  . apixaban (ELIQUIS) 5 MG TABS tablet Take 5 mg by mouth 2 (two) times daily.    . clopidogrel (PLAVIX) 75 MG tablet Take 1 tablet (75 mg total) by mouth daily. 90 tablet 1  . diclofenac sodium (VOLTAREN) 1 % GEL Apply 2 g topically See admin instructions. Apply 2 grams to affected areas three times a day as directed    . ezetimibe (ZETIA) 10 MG tablet Take 5 mg by mouth daily.    . memantine (NAMENDA) 10 MG tablet Take 10 mg by mouth 2 (two) times daily.    . metoprolol (LOPRESSOR) 50 MG tablet Take 50 mg by mouth 2 (two) times daily.    . nitroGLYCERIN (NITROSTAT) 0.4 MG SL tablet Place 0.4 mg under the tongue every 5 (five) minutes as needed for chest pain.    Marland Kitchen oxyCODONE (OXY IR/ROXICODONE) 5 MG immediate release tablet Take 1 tablet (5 mg total) by mouth every 6 (six) hours as needed for moderate pain. 20 tablet 0  . pantoprazole (PROTONIX) 40 MG tablet Take 40 mg by mouth daily.    . ramipril (ALTACE) 10 MG capsule Take 10 mg by mouth 2 (two) times daily.    . rivastigmine (EXELON) 6 MG capsule Take 6 mg by mouth 2 (two) times daily.    Derrill Memo ON 04/01/2018] amiodarone (PACERONE) 200 MG tablet Take 1 tablet (200 mg total) by mouth daily. (Patient not taking: Reported on 03/30/2018) 60 tablet 1   No current facility-administered medications for this visit.     REVIEW OF SYSTEMS:  [X]  denotes positive finding, [ ]  denotes negative finding Cardiac  Comments:  Chest pain or chest pressure:    Shortness of breath upon exertion:    Short of breath when lying flat:    Irregular heart rhythm:        Vascular    Pain in calf, thigh, or hip brought on by ambulation:    Pain in feet at night that wakes you up from your sleep:      Blood clot in your veins:    Leg swelling:  x Mild swelling left leg      Pulmonary    Oxygen at home:    Productive cough:     Wheezing:         Neurologic    Sudden weakness in arms or legs:     Sudden numbness in arms or legs:  Sudden onset of difficulty speaking or slurred speech:    Temporary loss of vision in one eye:     Problems with dizziness:         Gastrointestinal    Blood in stool:     Vomited blood:         Genitourinary    Burning when urinating:     Blood in urine:        Psychiatric    Major depression:         Hematologic    Bleeding problems:    Problems with blood clotting too easily:        Skin    Rashes or ulcers:        Constitutional    Fever or chills:      PHYSICAL EXAM: Vitals:   03/30/18 1031  BP: 124/66  Pulse: 92  Resp: 16  Temp: (!) 97 F (36.1 C)  TempSrc: Oral  SpO2: 95%  Weight: 81.2 kg  Height: 5\' 9"  (1.753 m)    GENERAL: The patient is a well-nourished male, in no acute distress. The vital signs are documented above. CARDIAC: There is a regular rate and rhythm.  VASCULAR:  2+ palpable right femoral pulse, groin wound healed Right leg fasciotomies have healed R DP biphasic signal, PT palpable in right foot Left DP palpable   DATA:   None to review.  Assessment/Plan:  77 year old male status post right lower extremity thrombectomy for acute limb ischemia on 01/09/2018 by Dr. Bridgett Larsson.  He presents for interval 30-month follow-up to check his wound.  His groin incision and right leg fasciotomy incisions have healed without issue.  He has a palpable PT pulse in his right foot.  I have offered just follow-up PRN given that his daughter states he is closely followed in the [place] system.   [name], MD [facility] Office: [phone] Pager: [place]

## 2019-07-23 ENCOUNTER — Inpatient Hospital Stay (HOSPITAL_COMMUNITY)
Admission: EM | Admit: 2019-07-23 | Discharge: 2019-07-26 | DRG: 254 | Disposition: A | Discharge status: Home / Self Care | Payer: No Typology Code available for payment source | Source: Other Acute Inpatient Hospital | Attending: Vascular Surgery | Admitting: Vascular Surgery

## 2019-07-23 ENCOUNTER — Encounter (HOSPITAL_COMMUNITY): Payer: Self-pay | Admitting: *Deleted

## 2019-07-23 ENCOUNTER — Inpatient Hospital Stay (HOSPITAL_COMMUNITY): Payer: No Typology Code available for payment source

## 2019-07-23 DIAGNOSIS — Z87891 Personal history of nicotine dependence: Secondary | ICD-10-CM

## 2019-07-23 DIAGNOSIS — I10 Essential (primary) hypertension: Secondary | ICD-10-CM | POA: Diagnosis present

## 2019-07-23 DIAGNOSIS — Z8673 Personal history of transient ischemic attack (TIA), and cerebral infarction without residual deficits: Secondary | ICD-10-CM | POA: Diagnosis not present

## 2019-07-23 DIAGNOSIS — Z7901 Long term (current) use of anticoagulants: Secondary | ICD-10-CM | POA: Diagnosis not present

## 2019-07-23 DIAGNOSIS — Z8249 Family history of ischemic heart disease and other diseases of the circulatory system: Secondary | ICD-10-CM | POA: Diagnosis not present

## 2019-07-23 DIAGNOSIS — Z03818 Encounter for observation for suspected exposure to other biological agents ruled out: Secondary | ICD-10-CM | POA: Diagnosis not present

## 2019-07-23 DIAGNOSIS — Z7902 Long term (current) use of antithrombotics/antiplatelets: Secondary | ICD-10-CM

## 2019-07-23 DIAGNOSIS — I252 Old myocardial infarction: Secondary | ICD-10-CM

## 2019-07-23 DIAGNOSIS — Z01810 Encounter for preprocedural cardiovascular examination: Secondary | ICD-10-CM

## 2019-07-23 DIAGNOSIS — K219 Gastro-esophageal reflux disease without esophagitis: Secondary | ICD-10-CM | POA: Diagnosis present

## 2019-07-23 DIAGNOSIS — I480 Paroxysmal atrial fibrillation: Secondary | ICD-10-CM | POA: Diagnosis present

## 2019-07-23 DIAGNOSIS — I998 Other disorder of circulatory system: Secondary | ICD-10-CM | POA: Diagnosis present

## 2019-07-23 DIAGNOSIS — I739 Peripheral vascular disease, unspecified: Secondary | ICD-10-CM | POA: Diagnosis present

## 2019-07-23 DIAGNOSIS — I2510 Atherosclerotic heart disease of native coronary artery without angina pectoris: Secondary | ICD-10-CM | POA: Diagnosis present

## 2019-07-23 DIAGNOSIS — Z79899 Other long term (current) drug therapy: Secondary | ICD-10-CM | POA: Diagnosis not present

## 2019-07-23 DIAGNOSIS — Z981 Arthrodesis status: Secondary | ICD-10-CM

## 2019-07-23 DIAGNOSIS — J449 Chronic obstructive pulmonary disease, unspecified: Secondary | ICD-10-CM | POA: Diagnosis not present

## 2019-07-23 DIAGNOSIS — E785 Hyperlipidemia, unspecified: Secondary | ICD-10-CM | POA: Diagnosis present

## 2019-07-23 DIAGNOSIS — I70421 Atherosclerosis of autologous vein bypass graft(s) of the extremities with rest pain, right leg: Secondary | ICD-10-CM | POA: Diagnosis present

## 2019-07-23 DIAGNOSIS — I70221 Atherosclerosis of native arteries of extremities with rest pain, right leg: Secondary | ICD-10-CM | POA: Diagnosis not present

## 2019-07-23 DIAGNOSIS — Z20822 Contact with and (suspected) exposure to covid-19: Secondary | ICD-10-CM | POA: Diagnosis present

## 2019-07-23 LAB — PROTIME-INR
INR: 1.2 (ref 0.8–1.2)
Prothrombin Time: 14.7 seconds (ref 11.4–15.2)

## 2019-07-23 LAB — CBC
HCT: 39.9 % (ref 39.0–52.0)
Hemoglobin: 13.1 g/dL (ref 13.0–17.0)
MCH: 32.4 pg (ref 26.0–34.0)
MCHC: 32.8 g/dL (ref 30.0–36.0)
MCV: 98.8 fL (ref 80.0–100.0)
Platelets: 196 10*3/uL (ref 150–400)
RBC: 4.04 MIL/uL — ABNORMAL LOW (ref 4.22–5.81)
RDW: 12.8 % (ref 11.5–15.5)
WBC: 5.6 10*3/uL (ref 4.0–10.5)
nRBC: 0 % (ref 0.0–0.2)

## 2019-07-23 LAB — HEPARIN LEVEL (UNFRACTIONATED): Heparin Unfractionated: 1.4 IU/mL — ABNORMAL HIGH (ref 0.30–0.70)

## 2019-07-23 LAB — RESPIRATORY PANEL BY RT PCR (FLU A&B, COVID)
Influenza A by PCR: NEGATIVE
Influenza B by PCR: NEGATIVE
SARS Coronavirus 2 by RT PCR: NEGATIVE

## 2019-07-23 LAB — BASIC METABOLIC PANEL
Anion gap: 11 (ref 5–15)
BUN: 23 mg/dL (ref 8–23)
CO2: 23 mmol/L (ref 22–32)
Calcium: 8.7 mg/dL — ABNORMAL LOW (ref 8.9–10.3)
Chloride: 102 mmol/L (ref 98–111)
Creatinine, Ser: 1.55 mg/dL — ABNORMAL HIGH (ref 0.61–1.24)
GFR calc Af Amer: 49 mL/min — ABNORMAL LOW (ref >60)
GFR calc non Af Amer: 42 mL/min — ABNORMAL LOW (ref >60)
Glucose, Bld: 94 mg/dL (ref 70–99)
Potassium: 4.4 mmol/L (ref 3.5–5.1)
Sodium: 136 mmol/L (ref 135–145)

## 2019-07-23 LAB — TYPE AND SCREEN
ABO/RH(D): A POS
Antibody Screen: NEGATIVE

## 2019-07-23 LAB — APTT
aPTT: 120 seconds — ABNORMAL HIGH (ref 24–36)
aPTT: 101 seconds — ABNORMAL HIGH (ref 24–36)

## 2019-07-23 MED ORDER — GUAIFENESIN-DM 100-10 MG/5ML PO SYRP: 15.0000 mL | ORAL_SOLUTION | ORAL | Status: DC | PRN

## 2019-07-23 MED ORDER — SODIUM CHLORIDE 0.9 % IV SOLN: INTRAVENOUS | Status: DC

## 2019-07-23 MED ORDER — NITROGLYCERIN 0.4 MG SL SUBL: 0.4000 mg | SUBLINGUAL_TABLET | SUBLINGUAL | Status: DC | PRN

## 2019-07-23 MED ORDER — TRAMADOL HCL 50 MG PO TABS: 50.0000 mg | ORAL_TABLET | Freq: Four times a day (QID) | ORAL | Status: DC | PRN

## 2019-07-23 MED ORDER — HYDRALAZINE HCL 20 MG/ML IJ SOLN: 5.0000 mg | INTRAMUSCULAR | Status: DC | PRN

## 2019-07-23 MED ORDER — LABETALOL HCL 5 MG/ML IV SOLN: 10.0000 mg | INTRAVENOUS | Status: DC | PRN

## 2019-07-23 MED ORDER — PANTOPRAZOLE SODIUM 40 MG PO TBEC: 40.0000 mg | DELAYED_RELEASE_TABLET | Freq: Every day | ORAL | Status: DC

## 2019-07-23 MED ORDER — PHENOL 1.4 % MT LIQD: 1.0000 | OROMUCOSAL | Status: DC | PRN

## 2019-07-23 MED ORDER — METOPROLOL TARTRATE 50 MG PO TABS
50.0000 mg | ORAL_TABLET | Freq: Two times a day (BID) | ORAL | Status: DC
  Administered 2019-07-23 – 2019-07-26 (×6): 50 mg via ORAL
  Filled 2019-07-23 (×8): qty 1

## 2019-07-23 MED ORDER — BISACODYL 5 MG PO TBEC: 5.0000 mg | DELAYED_RELEASE_TABLET | Freq: Every day | ORAL | Status: DC | PRN

## 2019-07-23 MED ORDER — POTASSIUM CHLORIDE CRYS ER 20 MEQ PO TBCR
20.0000 meq | EXTENDED_RELEASE_TABLET | Freq: Once | ORAL | Status: AC
  Administered 2019-07-25: 40 meq via ORAL
  Filled 2019-07-23: qty 2

## 2019-07-23 MED ORDER — CLOPIDOGREL BISULFATE 75 MG PO TABS
75.0000 mg | ORAL_TABLET | Freq: Every day | ORAL | Status: DC
  Administered 2019-07-23 – 2019-07-26 (×4): 75 mg via ORAL
  Filled 2019-07-23 (×4): qty 1

## 2019-07-23 MED ORDER — MEMANTINE HCL 10 MG PO TABS
10.0000 mg | ORAL_TABLET | Freq: Two times a day (BID) | ORAL | Status: DC
  Administered 2019-07-23 – 2019-07-26 (×7): 10 mg via ORAL
  Filled 2019-07-23 (×7): qty 1

## 2019-07-23 MED ORDER — PANTOPRAZOLE SODIUM 40 MG PO TBEC
40.0000 mg | DELAYED_RELEASE_TABLET | Freq: Every day | ORAL | Status: DC
  Administered 2019-07-23 – 2019-07-26 (×4): 40 mg via ORAL
  Filled 2019-07-23 (×4): qty 1

## 2019-07-23 MED ORDER — ALUM & MAG HYDROXIDE-SIMETH 200-200-20 MG/5ML PO SUSP: 15.0000 mL | ORAL | Status: DC | PRN

## 2019-07-23 MED ORDER — MORPHINE SULFATE (PF) 2 MG/ML IV SOLN: 2.0000 mg | INTRAVENOUS | Status: DC | PRN

## 2019-07-23 MED ORDER — DOCUSATE SODIUM 100 MG PO CAPS
100.0000 mg | ORAL_CAPSULE | Freq: Two times a day (BID) | ORAL | Status: DC
  Administered 2019-07-23 – 2019-07-26 (×3): 100 mg via ORAL
  Filled 2019-07-23 (×5): qty 1

## 2019-07-23 MED ORDER — AMIODARONE HCL 200 MG PO TABS
400.0000 mg | ORAL_TABLET | Freq: Two times a day (BID) | ORAL | Status: DC
  Administered 2019-07-23 – 2019-07-24 (×3): 400 mg via ORAL
  Filled 2019-07-23 (×3): qty 2

## 2019-07-23 MED ORDER — RIVASTIGMINE TARTRATE 1.5 MG PO CAPS
6.0000 mg | ORAL_CAPSULE | Freq: Two times a day (BID) | ORAL | Status: DC
  Administered 2019-07-23 – 2019-07-26 (×6): 6 mg via ORAL
  Filled 2019-07-23 (×8): qty 4

## 2019-07-23 MED ORDER — POLYETHYLENE GLYCOL 3350 17 G PO PACK: 17.0000 g | PACK | Freq: Every day | ORAL | Status: DC | PRN

## 2019-07-23 MED ORDER — METOPROLOL TARTRATE 5 MG/5ML IV SOLN: 2.0000 mg | INTRAVENOUS | Status: DC | PRN

## 2019-07-23 MED ORDER — ONDANSETRON HCL 4 MG/2ML IJ SOLN: 4.0000 mg | Freq: Four times a day (QID) | INTRAMUSCULAR | Status: DC | PRN

## 2019-07-23 MED ORDER — HEPARIN (PORCINE) 25000 UT/250ML-% IV SOLN
1050.0000 [IU]/h | INTRAVENOUS | Status: DC
  Administered 2019-07-23: 1100 [IU]/h via INTRAVENOUS
  Administered 2019-07-23 – 2019-07-24 (×2): 1050 [IU]/h via INTRAVENOUS
  Filled 2019-07-23 (×2): qty 250

## 2019-07-23 MED ORDER — ACETAMINOPHEN 325 MG PO TABS
325.0000 mg | ORAL_TABLET | ORAL | Status: DC | PRN
  Administered 2019-07-25: 650 mg via ORAL
  Filled 2019-07-23: qty 2

## 2019-07-23 MED ORDER — RAMIPRIL 10 MG PO CAPS
10.0000 mg | ORAL_CAPSULE | Freq: Two times a day (BID) | ORAL | Status: DC
  Administered 2019-07-23 – 2019-07-26 (×6): 10 mg via ORAL
  Filled 2019-07-23 (×7): qty 1

## 2019-07-23 MED ORDER — EZETIMIBE 10 MG PO TABS
5.0000 mg | ORAL_TABLET | Freq: Every day | ORAL | Status: DC
  Administered 2019-07-23 – 2019-07-26 (×4): 5 mg via ORAL
  Filled 2019-07-23 (×4): qty 1

## 2019-07-23 MED ORDER — ACETAMINOPHEN 325 MG RE SUPP: 325.0000 mg | RECTAL | Status: DC | PRN

## 2019-07-23 NOTE — Progress Notes (Signed)
VASCULAR LAB PRELIMINARY  PRELIMINARY  PRELIMINARY  PRELIMINARY  Right lower extremity arterial duplex completed.    Preliminary report:  See CV proc for preliminary results.   Markita Stcharles, RVT 07/23/2019, 4:04 PM

## 2019-07-23 NOTE — ED Notes (Signed)
Dr. [name] paged to make aware pt has arrived

## 2019-07-23 NOTE — ED Triage Notes (Signed)
Pt sent from [facility] for vascular consult due to coldness to R leg. Per [facility] transport, pt had a vascular US to R leg that showed "clogged green filter." On arrival, bilateral legs equal in warm, palpable pedal pulses to L leg and doppler pulses to R leg. Heparin gtt in place running at 30.2 ml/hr and 1512unit/hr.

## 2019-07-23 NOTE — Progress Notes (Signed)
Pt arrived to 4e from [facility] ED. Pt ambulated to bed from stretcher. Pt oriented to room and staff. Vitals obtained. Telemetry box applied and CCMD notified x2 verifiers. Breakfast order placed. Pulses dopplerable in feet.

## 2019-07-23 NOTE — Progress Notes (Signed)
VASCULAR LAB PRELIMINARY  PRELIMINARY  PRELIMINARY  PRELIMINARY  ABIs completed.    Preliminary report:  See CV proc for preliminary results.   Trinette Vera, RVT 07/23/2019, 4:05 PM

## 2019-07-23 NOTE — Progress Notes (Addendum)
[facility] for Heparin Indication: PAD  Allergies  Allergen Reactions  . Oxycodone Nausea Only  . Statins Other (See Comments)    Muscle and Bone pain Muscle and Bone pain    Patient Measurements: Height: 5\' 9"  (175.3 cm) Weight: 180 lb 9.6 oz (81.9 kg) IBW/kg (Calculated) : 70.7  Heparin dosing weight: 81.9 kg  Vital Signs: Temp: 98.6 F (37 C) (01/30 0759) Temp Source: Oral (01/30 0759) BP: 156/95 (01/30 0759) Pulse Rate: 71 (01/30 0759)  Labs: Recent Labs    07/23/19 0145 07/23/19 0807  HGB 13.1  --   HCT 39.9  --   PLT 196  --   APTT 120* 101*  LABPROT 14.7  --   INR 1.2  --   HEPARINUNFRC  --  1.40*  CREATININE 1.55*  --     Estimated Creatinine Clearance: 38.6 mL/min (A) (by C-G formula based on SCr of 1.55 mg/dL (H)).   Medical History: Past Medical History:  Diagnosis Date  . Arthritis    "knees, elbows" (03/18/2018)  . Chronic edema    a. Chronic RLE edema.  Marland Kitchen COPD (chronic obstructive pulmonary disease) ([place])   . Coronary artery disease    a. MI s/p balloon 1996, details unclear.  Marland Kitchen Dysrhythmia    PROXIMAL MARGIN FIBRILATION  . GERD (gastroesophageal reflux disease)   . Hyperlipidemia   . Hypertension   . Myocardial infarction ([place]) 1996  . PAD (peripheral artery disease) ([place])    a. s/p stenting 08/2012, 02/2013.   Marland Kitchen PAF (paroxysmal atrial fibrillation) ([place])   . Sleep apnea    "have mask; have to start wearing it" (03/18/2018)  . Stroke [facility]) ~ 2014   denies residual on 03/18/2018  . Thrombosis of lower extremity    a. Listed on patient's medical bracelet - at [place].  Marland Kitchen Type II diabetes mellitus ([facility])     Medications:  No current facility-administered medications on file prior to encounter.   Current Outpatient Medications on File Prior to Encounter  Medication Sig Dispense Refill  . acetaminophen (TYLENOL) 500 MG tablet Take 500 mg by mouth 2 (two) times daily as needed (for pain or headaches).      Marland Kitchen amiodarone (PACERONE) 400 MG tablet Take 1 tablet (400 mg total) by mouth 2 (two) times daily. 28 tablet 0  . apixaban (ELIQUIS) 5 MG TABS tablet Take 5 mg by mouth 2 (two) times daily.    . clopidogrel (PLAVIX) 75 MG tablet Take 1 tablet (75 mg total) by mouth daily. 90 tablet 1  . ezetimibe (ZETIA) 10 MG tablet Take 5 mg by mouth daily.    . memantine (NAMENDA) 10 MG tablet Take 10 mg by mouth 2 (two) times daily.    . metoprolol (LOPRESSOR) 50 MG tablet Take 50 mg by mouth 2 (two) times daily.    . nitroGLYCERIN (NITROSTAT) 0.4 MG SL tablet Place 0.4 mg under the tongue every 5 (five) minutes as needed for chest pain.    . pantoprazole (PROTONIX) 40 MG tablet Take 40 mg by mouth daily.    . ramipril (ALTACE) 10 MG capsule Take 10 mg by mouth 2 (two) times daily.    . rivastigmine (EXELON) 6 MG capsule Take 6 mg by mouth 2 (two) times daily.    . [DISCONTINUED] amiodarone (PACERONE) 200 MG tablet Take 1 tablet (200 mg total) by mouth daily. (Patient not taking: Reported on 03/30/2018) 60 tablet 1  . [DISCONTINUED] diclofenac sodium (VOLTAREN) 1 %  GEL Apply 2 g topically See admin instructions. Apply 2 grams to affected areas three times a day as directed    . [DISCONTINUED] oxyCODONE (OXY IR/ROXICODONE) 5 MG immediate release tablet Take 1 tablet (5 mg total) by mouth every 6 (six) hours as needed for moderate pain. 20 tablet 0     Assessment: 79 y.o. male with RLE fem-pop graft thrombosis for heparin. Eliquis prior to admission --last dose 0700 1/29.  Started on heparin 1500 units/hr at [facility] at unknown time.   APTT now at upper end of therapeutic after decreasing drip rate to 1100 units/hr. Heparin level falsely elevated given recent Eliquis administration. CBC wnl. No overt bleeding per patient or infusion issues noted.   Will dose heparin decrease dose slightly to maintain therapeutic level and continue dosing based off aPTT levels for now.    Goal of Therapy:  APTT  66-102 Heparin level 0.3-0.7 units/ml Monitor platelets by anticoagulation protocol: Yes   Plan:  Decrease heparin IV to 1050 units/hr Check aPTT and heparin level with AM labs Monitor for bleeding  Richardine Service, PharmD PGY1 Pharmacy Resident Phone: [phone] 07/23/2019  10:02 AM  Please check [email or web address] for unit-specific pharmacy phone numbers.

## 2019-07-23 NOTE — H&P (Signed)
H+P    History of Present Illness: This is a 79 y.o. male previously a patient of Dr. Bridgett Larsson and he initially met in July 2019.  At that time he had previous stenting and bypass in his right lower extremity performed at the [facility] in [place].  He was also on Coumadin at that time for previous myocardial infarction and atrial fibrillation.  He underwent right common femoral to below-knee popliteal artery bypass with tibial thrombectomy and 4 compartment fasciotomy.  He subsequently healed from those wounds.  In September of that year he was admitted with non-ST elevation MI underwent cardiac cath.  He was transitioned to Eliquis and Plavix which she is still taking both of those.  He also takes amiodarone now for his atrial fibrillation so this is well controlled.  He was last evaluated in our office in October 2019 at which time he was discharged to the care of the [place].  His [facility] notes he was evaluated in October 2020 and his graft was noted to be patent.  He states that sometime before Christmas he started having issues with the leg particularly pain with walking occasional pain at rest.  More recently he developed mild pain on the medial aspect of the upper and lower leg with numbness focused on the great toe.  His last dose of Eliquis was Friday morning.  States that currently pain is very well controlled this has been an ongoing issue for several months.  He does take his Plavix and Eliquis as prescribed.  He has a history of smoking but quit in the 1990s.  He takes Zetia is intolerant to statins and has been considered for PCSK9 inhibitors in the past but this does not appear that it was pursued.  Past Medical History:  Diagnosis Date  . Arthritis    "knees, elbows" (03/18/2018)  . Chronic edema    a. Chronic RLE edema.  Marland Kitchen COPD (chronic obstructive pulmonary disease) ([place])   . Coronary artery disease    a. MI s/p balloon 1996, details unclear.  Marland Kitchen Dysrhythmia    PROXIMAL MARGIN FIBRILATION  .  GERD (gastroesophageal reflux disease)   . Hyperlipidemia   . Hypertension   . Myocardial infarction ([place]) 1996  . PAD (peripheral artery disease) ([place])    a. s/p stenting 08/2012, 02/2013.   Marland Kitchen PAF (paroxysmal atrial fibrillation) ([place])   . Sleep apnea    "have mask; have to start wearing it" (03/18/2018)  . Stroke [facility]) ~ 2014   denies residual on 03/18/2018  . Thrombosis of lower extremity    a. Listed on patient's medical bracelet - at [place].  Marland Kitchen Type II diabetes mellitus ([place])     Past Surgical History:  Procedure Laterality Date  . ANTERIOR LUMBAR FUSION  1981  . APPENDECTOMY    . APPLICATION OF WOUND VAC Right 01/09/2018   Procedure: APPLICATION OF WOUND VAC ON RIGHT LOWER LEG;  Surgeon: Conrad Barranquitas, MD;  Location: [place];  Service: Vascular;  Laterality: Right;  . BACK SURGERY    . CATARACT EXTRACTION W/ INTRAOCULAR LENS  IMPLANT, BILATERAL Bilateral   . CORONARY ANGIOPLASTY WITH STENT PLACEMENT     "I've got 1 stent in there" (03/18/2018)  . FASCIOTOMY Right 01/09/2018   Procedure: FASCIOTOMY RIGHT LOWER LEG;  Surgeon: Conrad [place] River, MD;  Location: [place];  Service: Vascular;  Laterality: Right;  . FASCIOTOMY CLOSURE Right 01/11/2018   Procedure: FASCIOTOMY CLOSURE RIGHT CALF;  Surgeon: Conrad South Fork, MD;  Location[facility];  Service: Vascular;  Laterality: Right;  . FEMORAL-POPLITEAL BYPASS GRAFT Right 01/09/2018   Procedure: RIGHT FEMORAL-POPLITEAL ARTERY BYPASS USING PROPATEN 6MM X 80CM VASCULAR GRAFT;  Surgeon: Conrad Hutto, MD;  Location: [place];  Service: Vascular;  Laterality: Right;  . HEMORRHOID SURGERY    . LEFT HEART CATH AND CORONARY ANGIOGRAPHY N/A 03/19/2018   Procedure: LEFT HEART CATH AND CORONARY ANGIOGRAPHY;  Surgeon: Martinique, Peter M, MD;  Location: [place] [facility];  Service: Cardiovascular;  Laterality: N/A;  . PATCH ANGIOPLASTY Right 01/09/2018   Procedure: [place] BIOLOGIC 1CM X 6CM PATCH;  Surgeon: Conrad [place], MD;  Location: [place];   Service: Vascular;  Laterality: Right;  . POSTERIOR LUMBAR FUSION  1978  . THROMBECTOMY FEMORAL ARTERY Right 01/09/2018   Procedure: THROMBECTOMY RIGHT LOWER LEG;  Surgeon: Conrad Dolton, MD;  Location: [place];  Service: Vascular;  Laterality: Right;  . TONSILLECTOMY      Allergies  Allergen Reactions  . Oxycodone Nausea Only  . Statins Other (See Comments)    Muscle and Bone pain Muscle and Bone pain    Prior to Admission medications   Medication Sig Start Date End Date Taking? Authorizing Provider  acetaminophen (TYLENOL) 500 MG tablet Take 500 mg by mouth 2 (two) times daily as needed (for pain or headaches).    Yes [provider]  amiodarone (PACERONE) 400 MG tablet Take 1 tablet (400 mg total) by mouth 2 (two) times daily. 03/19/18  Yes Almyra Deforest, PA  apixaban (ELIQUIS) 5 MG TABS tablet Take 5 mg by mouth 2 (two) times daily.   Yes [provider]  clopidogrel (PLAVIX) 75 MG tablet Take 1 tablet (75 mg total) by mouth daily. 01/14/18  Yes Dagoberto Ligas, PA-C  memantine (NAMENDA) 10 MG tablet Take 10 mg by mouth 2 (two) times daily.   Yes [provider]  metoprolol (LOPRESSOR) 50 MG tablet Take 50 mg by mouth 2 (two) times daily.   Yes [provider]  nitroGLYCERIN (NITROSTAT) 0.4 MG SL tablet Place 0.4 mg under the tongue every 5 (five) minutes as needed for chest pain.   Yes [provider]  pantoprazole (PROTONIX) 40 MG tablet Take 40 mg by mouth daily.   Yes [provider]  ramipril (ALTACE) 10 MG capsule Take 10 mg by mouth 2 (two) times daily.   Yes [provider]  rivastigmine (EXELON) 6 MG capsule Take 6 mg by mouth 2 (two) times daily.   Yes [provider]  ezetimibe (ZETIA) 10 MG tablet Take 5 mg by mouth daily.    [provider]    Social History   Socioeconomic History  . Marital status: Legally Separated    Spouse name: Not on file  . Number of children: Not on file  . Years of  education: Not on file  . Highest education level: Not on file  Occupational History  . Not on file  Tobacco Use  . Smoking status: Former Smoker    Packs/day: 3.00    Years: 30.00    Pack years: 90.00    Quit date: 1996    Years since quitting: 25.0  . Smokeless tobacco: Never Used  Substance and Sexual Activity  . Alcohol use: No    Comment: Remote alcohol use  . Drug use: Never  . Sexual activity: Not Currently  Other Topics Concern  . Not on file  Social History Narrative  . Not  on file   Social Determinants of Health   Financial Resource Strain:   . Difficulty of Paying Living Expenses: Not on file  Food Insecurity:   . Worried About Charity fundraiser in the Last Year: Not on file  . Ran Out of Food in the Last Year: Not on file  Transportation Needs:   . Lack of Transportation (Medical): Not on file  . Lack of Transportation (Non-Medical): Not on file  Physical Activity:   . Days of Exercise per Week: Not on file  . Minutes of Exercise per Session: Not on file  Stress:   . Feeling of Stress : Not on file  Social Connections:   . Frequency of Communication with Friends and Family: Not on file  . Frequency of Social Gatherings with Friends and Family: Not on file  . Attends Religious Services: Not on file  . Active Member of Clubs or Organizations: Not on file  . Attends Archivist Meetings: Not on file  . Marital Status: Not on file  Intimate Partner Violence:   . Fear of Current or Ex-Partner: Not on file  . Emotionally Abused: Not on file  . Physically Abused: Not on file  . Sexually Abused: Not on file     Family History  Problem Relation Age of Onset  . CAD Mother   . CAD Brother   . Hypertension Other     ROS:  Cardiovascular: []  chest pain/pressure []  palpitations []  SOB lying flat []  DOE [x]  pain in legs while walking [x]  pain in legs at rest []  pain in legs at night []  non-healing ulcers []  hx of DVT []  swelling in  legs  Pulmonary: []  productive cough []  asthma/wheezing []  home O2  Neurologic: [x]  weakness in []  arms [x]  legs [x]  numbness in []  arms [x]  legs [x]  hx of CVA []  mini stroke [] difficulty speaking or slurred speech []  temporary loss of vision in one eye []  dizziness  Hematologic: []  hx of cancer []  bleeding problems []  problems with blood clotting easily  Endocrine:   []  diabetes []  thyroid disease  GI []  vomiting blood []  blood in stool  GU: []  CKD/renal failure []  HD--[]  M/W/F or []  T/T/S []  burning with urination []  blood in urine  Psychiatric: []  anxiety []  depression  Musculoskeletal: []  arthritis []  joint pain  Integumentary: []  rashes []  ulcers  Constitutional: []  fever []  chills   Physical Examination  Vitals:   07/23/19 0324 07/23/19 0337  BP:  (!) 134/51  Pulse: 64 65  Resp: 18 18  Temp:    SpO2: 92% 92%   Body mass index is 27.77 kg/m.  General:  nad HENT: WNL, normocephalic Pulmonary: normal non-labored breathing Cardiac: Both femoral pulses are palpable.  I cannot palpate his popliteal pulses bilaterally He has a strong but monophasic posterior tibial signal in a week peroneal signal on the right Left side as strong biphasic posterior tibial signal Abdomen: soft, NT/ND, no masses Extremities: He moves both extremities without limitation.  Both appear well-perfused although capillary refill on the right is sluggish Musculoskeletal: no muscle wasting or atrophy  Neurologic: A&O X 3; lower extremities are sensory and motor intact  CBC    Component Value Date/Time   WBC 5.6 07/23/2019 0145   RBC 4.04 (L) 07/23/2019 0145   HGB 13.1 07/23/2019 0145   HCT 39.9 07/23/2019 0145   PLT 196 07/23/2019 0145   MCV 98.8 07/23/2019 0145   MCH 32.4 07/23/2019 0145  MCHC 32.8 07/23/2019 0145   RDW 12.8 07/23/2019 0145   LYMPHSABS 1.1 01/09/2018 1600   MONOABS 1.1 (H) 01/09/2018 1600   EOSABS 0.0 01/09/2018 1600   BASOSABS 0.0  01/09/2018 1600    BMET    Component Value Date/Time   NA 136 07/23/2019 0145   K 4.4 07/23/2019 0145   CL 102 07/23/2019 0145   CO2 23 07/23/2019 0145   GLUCOSE 94 07/23/2019 0145   BUN 23 07/23/2019 0145   CREATININE 1.55 (H) 07/23/2019 0145   CALCIUM 8.7 (L) 07/23/2019 0145   GFRNONAA 42 (L) 07/23/2019 0145   GFRAA 49 (L) 07/23/2019 0145    COAGS: Lab Results  Component Value Date   INR 1.2 07/23/2019   INR 1.28 01/09/2018   INR 2.37 (H) 07/04/2014     Non-Invasive Vascular Imaging:   Report from his outside right lower extremity duplex demonstrates no evidence of DVT but a new vascular obstruction in his femoral to popliteal graft that was patent on previous study in October 2020  ASSESSMENT/PLAN: This is a 79 y.o. male known to our service with previous right lower extremity thrombectomy and femoral to popliteal artery redo bypass with propatent graft and four-quadrant fasciotomies.  He was discharged to care of the [place] in October 2019.  He does take Eliquis and Plavix with history of atrial fibrillation and NSTEMI.  Last dose of Eliquis was yesterday.  Given the patient does not appear to be in any ischemic threat this does appear to be subacute in nature we will plan for right lower extremity duplex and ABIs today and allow Eliquis washout.  As long as there are no changes in his clinical course we will plan for angiography on Monday to evaluate his right lower extremity flow.  Given the chronicity of this I am not sure that lysis is going to be a viable option and I discussed that with him today.  Patient is okay for diet today.  Continue heparin drip.  Gurman Ashland C. [name], MD [facility] Office: [phone] Pager: [phone]

## 2019-07-23 NOTE — ED Provider Notes (Signed)
TIME SEEN: 1:16 AM  CHIEF COMPLAINT: Right leg pain, transfer from [place] hospital  HPI: Patient is a 79 year old male with history of COPD, CAD, peripheral arterial disease status post stenting (08/2012 and 02/2013), A. fib, hypertension, diabetes who presented to the [facility] with right leg pain that started Friday 07/22/19 morning.  States he did have numbness that has improved.  No weakness.  No injury.  Transfer to see vascular surgery.  Currently on a heparin infusion.  Patient on apixaban and clopidogrel.  No recent missed doses.  Patient had venous Doppler of the right lower extremity today at [facility] that showed no DVT but did show that his right femoral-popliteal arterial graft was filled with echogenic material and no flow identified compatible with a thrombosed graft.  Patient denies chest pain or shortness of breath.  No calf swelling or tenderness.  Last seen in the vascular office on 03/30/2018 by Dr. Carlis Abbott.  Patient underwent right lower extremity thrombectomy and fasciotomies on 01/09/2018 for acute limb ischemia.  ROS: See HPI Constitutional: no fever  Eyes: no drainage  ENT: no runny nose   Cardiovascular:  no chest pain  Resp: no SOB  GI: no vomiting GU: no dysuria Integumentary: no rash  Allergy: no hives  Musculoskeletal: no leg swelling  Neurological: no slurred speech ROS otherwise negative  PAST MEDICAL HISTORY/PAST SURGICAL HISTORY:  Past Medical History:  Diagnosis Date  . Arthritis    "knees, elbows" (03/18/2018)  . Chronic edema    a. Chronic RLE edema.  Marland Kitchen COPD (chronic obstructive pulmonary disease) ([place])   . Coronary artery disease    a. MI s/p balloon 1996, details unclear.  Marland Kitchen Dysrhythmia    PROXIMAL MARGIN FIBRILATION  . GERD (gastroesophageal reflux disease)   . Hyperlipidemia   . Hypertension   . Myocardial infarction ([place]) 1996  . PAD (peripheral artery disease) ([place])    a. s/p stenting 08/2012, 02/2013.   Marland Kitchen PAF (paroxysmal atrial fibrillation)  ([place])   . Sleep apnea    "have mask; have to start wearing it" (03/18/2018)  . Stroke [facility]) ~ 2014   denies residual on 03/18/2018  . Thrombosis of lower extremity    a. Listed on patient's medical bracelet - at [place].  Marland Kitchen Type II diabetes mellitus ([facility])     MEDICATIONS:  Prior to Admission medications   Medication Sig Start Date End Date Taking? Authorizing Provider  acetaminophen (TYLENOL) 500 MG tablet Take 500 mg by mouth 2 (two) times daily as needed (for pain or headaches).     [provider]  amiodarone (PACERONE) 200 MG tablet Take 1 tablet (200 mg total) by mouth daily. Patient not taking: Reported on 03/30/2018 04/01/18   Almyra Deforest, PA  amiodarone (PACERONE) 400 MG tablet Take 1 tablet (400 mg total) by mouth 2 (two) times daily. 03/19/18   Almyra Deforest, PA  apixaban (ELIQUIS) 5 MG TABS tablet Take 5 mg by mouth 2 (two) times daily.    [provider]  clopidogrel (PLAVIX) 75 MG tablet Take 1 tablet (75 mg total) by mouth daily. 01/14/18   Dagoberto Ligas, PA-C  diclofenac sodium (VOLTAREN) 1 % GEL Apply 2 g topically See admin instructions. Apply 2 grams to affected areas three times a day as directed    [provider]  ezetimibe (ZETIA) 10 MG tablet Take 5 mg by mouth daily.    [provider]  memantine (NAMENDA) 10 MG tablet Take 10 mg by mouth 2 (two) times daily.  [provider]  metoprolol (LOPRESSOR) 50 MG tablet Take 50 mg by mouth 2 (two) times daily.    [provider]  nitroGLYCERIN (NITROSTAT) 0.4 MG SL tablet Place 0.4 mg under the tongue every 5 (five) minutes as needed for chest pain.    [provider]  oxyCODONE (OXY IR/ROXICODONE) 5 MG immediate release tablet Take 1 tablet (5 mg total) by mouth every 6 (six) hours as needed for moderate pain. 01/14/18   Dagoberto Ligas, PA-C  pantoprazole (PROTONIX) 40 MG tablet Take 40 mg by mouth daily.    [provider]  ramipril (ALTACE) 10 MG capsule Take  10 mg by mouth 2 (two) times daily.    [provider]  rivastigmine (EXELON) 6 MG capsule Take 6 mg by mouth 2 (two) times daily.    [provider]    ALLERGIES:  Allergies  Allergen Reactions  . Oxycodone Nausea Only  . Statins Other (See Comments)    Muscle and Bone pain Muscle and Bone pain    SOCIAL HISTORY:  Social History   Tobacco Use  . Smoking status: Former Smoker    Packs/day: 3.00    Years: 30.00    Pack years: 90.00    Quit date: 1996    Years since quitting: 25.0  . Smokeless tobacco: Never Used  Substance Use Topics  . Alcohol use: No    Comment: Remote alcohol use    FAMILY HISTORY: Family History  Problem Relation Age of Onset  . CAD Mother   . CAD Brother   . Hypertension Unknown     EXAM: BP (!) 172/66 (BP Location: Left Arm)   Pulse 62   Temp 97.9 F (36.6 C) (Oral)   Resp 15   SpO2 92%  CONSTITUTIONAL: Alert and oriented and responds appropriately to questions. Well-appearing; well-nourished HEAD: Normocephalic EYES: Conjunctivae clear, pupils appear equal, EOM appear intact ENT: normal nose; moist mucous membranes NECK: Supple, normal ROM CARD: RRR; S1 and S2 appreciated; no murmurs, no clicks, no rubs, no gallops RESP: Normal chest excursion without splinting or tachypnea; breath sounds clear and equal bilaterally; no wheezes, no rhonchi, no rales, no hypoxia or respiratory distress, speaking full sentences ABD/GI: Normal bowel sounds; non-distended; soft, non-tender, no rebound, no guarding, no peritoneal signs, no hepatosplenomegaly BACK:  The back appears normal EXT: Normal ROM in all joints; no deformity noted, no edema; no cyanosis, no calf tenderness or calf swelling, normal sensation throughout both legs, both legs are warm to touch with normal capillary refill, I am able to Doppler and palpate DP pulses bilaterally, he has dopplerable PT pulses bilaterally, no bony tenderness or bony deformity, compartments soft,  no redness or warmth, no ecchymosis or swelling, no joint effusion SKIN: Normal color for age and race; warm; no rash on exposed skin NEURO: Moves all extremities equally PSYCH: The patient's mood and manner are appropriate.   MEDICAL DECISION MAKING: Patient sent here for concerns for clotted femoropopliteal right arterial graft.  Strong pulses on exam without sensory loss, color changes or temperature changes.  Dr. [name] with vascular surgery consulted by the [facility] prior to transfer.  He has been updated that patient is here.  Requesting labs and Covid screen and then a call back given patient appears hemodynamically stable with good pulses bilaterally.  Heparin currently infusing.  ED PROGRESS: Labs unremarkable other than chronic kidney disease which appears stable for patient.  Covid negative.  Patient still has equal pulses.  Not complaining of  pain at this time.  Resting comfortably.   3:37 AM  Spoke with Dr. [name] with vascular surgery.  He will see patient in the ED.  5:10 AM  Dr. [name] with vascular to admit.  Appreciate vascular assistance with this patient.   I reviewed all nursing notes and pertinent previous records as available.  I have interpreted any EKGs, lab and urine results, imaging (as available).   CRITICAL CARE Performed by: Pryor Curia   Total critical care time: 50 minutes  Critical care time was exclusive of separately billable procedures and treating other patients.  Critical care was necessary to treat or prevent imminent or life-threatening deterioration.  Critical care was time spent personally by me on the following activities: development of treatment plan with patient and/or surrogate as well as nursing, discussions with consultants, evaluation of patient's response to treatment, examination of patient, obtaining history from patient or surrogate, ordering and performing treatments and interventions, ordering and review of laboratory studies, ordering and  review of radiographic studies, pulse oximetry and re-evaluation of patient's condition.   Donald Gordon was evaluated in Emergency Department on 07/23/2019 for the symptoms described in the history of present illness. He was evaluated in the context of the global COVID-19 pandemic, which necessitated consideration that the patient might be at risk for infection with the SARS-CoV-2 virus that causes COVID-19. Institutional protocols and algorithms that pertain to the evaluation of patients at risk for COVID-19 are in a state of rapid change based on information released by regulatory bodies including the CDC and federal and state organizations. These policies and algorithms were followed during the patient's care in the ED.  Patient was seen wearing N95, face shield, gloves.    Huma Imhoff, Delice Bison, DO 07/23/19 [phone]

## 2019-07-23 NOTE — Progress Notes (Addendum)
ANTICOAGULATION CONSULT NOTE - Initial Consult  Pharmacy Consult for Heparin Indication: PAD  Allergies  Allergen Reactions  . Oxycodone Nausea Only  . Statins Other (See Comments)    Muscle and Bone pain Muscle and Bone pain    Patient Measurements: Height: 5\' 9"  (175.3 cm) Weight: 188 lb 0.8 oz (85.3 kg) IBW/kg (Calculated) : 70.7  Vital Signs: Temp: 97.9 F (36.6 C) (01/30 0103) Temp Source: Oral (01/30 0103) BP: 122/52 (01/30 0115) Pulse Rate: 62 (01/30 0115)  Labs: No results for input(s): HGB, HCT, PLT, APTT, LABPROT, INR, HEPARINUNFRC, HEPRLOWMOCWT, CREATININE, CKTOTAL, CKMB, TROPONINIHS in the last 72 hours.  CrCl cannot be calculated (Patient's most recent lab result is older than the maximum 21 days allowed.).   Medical History: Past Medical History:  Diagnosis Date  . Arthritis    "knees, elbows" (03/18/2018)  . Chronic edema    a. Chronic RLE edema.  Marland Kitchen COPD (chronic obstructive pulmonary disease) ([place])   . Coronary artery disease    a. MI s/p balloon 1996, details unclear.  Marland Kitchen Dysrhythmia    PROXIMAL MARGIN FIBRILATION  . GERD (gastroesophageal reflux disease)   . Hyperlipidemia   . Hypertension   . Myocardial infarction ([place]) 1996  . PAD (peripheral artery disease) ([place])    a. s/p stenting 08/2012, 02/2013.   Marland Kitchen PAF (paroxysmal atrial fibrillation) ([place])   . Sleep apnea    "have mask; have to start wearing it" (03/18/2018)  . Stroke [facility]) ~ 2014   denies residual on 03/18/2018  . Thrombosis of lower extremity    a. Listed on patient's medical bracelet - at [place].  Marland Kitchen Type II diabetes mellitus (HCC)     Medications:  No current facility-administered medications on file prior to encounter.   Current Outpatient Medications on File Prior to Encounter  Medication Sig Dispense Refill  . acetaminophen (TYLENOL) 500 MG tablet Take 500 mg by mouth 2 (two) times daily as needed (for pain or headaches).     Marland Kitchen amiodarone (PACERONE) 200 MG tablet Take 1 tablet  (200 mg total) by mouth daily. (Patient not taking: Reported on 03/30/2018) 60 tablet 1  . amiodarone (PACERONE) 400 MG tablet Take 1 tablet (400 mg total) by mouth 2 (two) times daily. 28 tablet 0  . apixaban (ELIQUIS) 5 MG TABS tablet Take 5 mg by mouth 2 (two) times daily.    . clopidogrel (PLAVIX) 75 MG tablet Take 1 tablet (75 mg total) by mouth daily. 90 tablet 1  . diclofenac sodium (VOLTAREN) 1 % GEL Apply 2 g topically See admin instructions. Apply 2 grams to affected areas three times a day as directed    . ezetimibe (ZETIA) 10 MG tablet Take 5 mg by mouth daily.    . memantine (NAMENDA) 10 MG tablet Take 10 mg by mouth 2 (two) times daily.    . metoprolol (LOPRESSOR) 50 MG tablet Take 50 mg by mouth 2 (two) times daily.    . nitroGLYCERIN (NITROSTAT) 0.4 MG SL tablet Place 0.4 mg under the tongue every 5 (five) minutes as needed for chest pain.    Marland Kitchen oxyCODONE (OXY IR/ROXICODONE) 5 MG immediate release tablet Take 1 tablet (5 mg total) by mouth every 6 (six) hours as needed for moderate pain. 20 tablet 0  . pantoprazole (PROTONIX) 40 MG tablet Take 40 mg by mouth daily.    . ramipril (ALTACE) 10 MG capsule Take 10 mg by mouth 2 (two) times daily.    . rivastigmine (EXELON) 6  MG capsule Take 6 mg by mouth 2 (two) times daily.       Assessment: 79 y.o. male with RLE fem-pop graft thrombosis for heparin. Currently on Eliquis at home--last dose 0700 1/29.  Started on heparin 1500 units/hr at [facility] ay unknown time.    Goal of Therapy:  APTT 66-102 Heparin level 0.3-0.7 units/ml Monitor platelets by anticoagulation protocol: Yes   Plan:  Change heparin 1100 units/hr APTT in 6 hours  Donald Gordon, Bronson Curb 07/23/2019,1:38 AM

## 2019-07-24 ENCOUNTER — Other Ambulatory Visit: Payer: Self-pay

## 2019-07-24 LAB — CBC
HCT: 40.1 % (ref 39.0–52.0)
Hemoglobin: 13 g/dL (ref 13.0–17.0)
MCH: 31.9 pg (ref 26.0–34.0)
MCHC: 32.4 g/dL (ref 30.0–36.0)
MCV: 98.5 fL (ref 80.0–100.0)
Platelets: 203 10*3/uL (ref 150–400)
RBC: 4.07 MIL/uL — ABNORMAL LOW (ref 4.22–5.81)
RDW: 12.9 % (ref 11.5–15.5)
WBC: 5.3 10*3/uL (ref 4.0–10.5)
nRBC: 0 % (ref 0.0–0.2)

## 2019-07-24 LAB — APTT: aPTT: 91 seconds — ABNORMAL HIGH (ref 24–36)

## 2019-07-24 LAB — HEPARIN LEVEL (UNFRACTIONATED): Heparin Unfractionated: 0.92 IU/mL — ABNORMAL HIGH (ref 0.30–0.70)

## 2019-07-24 MED ORDER — AMIODARONE HCL 200 MG PO TABS
200.0000 mg | ORAL_TABLET | Freq: Every day | ORAL | Status: DC
  Administered 2019-07-25 – 2019-07-26 (×2): 200 mg via ORAL
  Filled 2019-07-24 (×2): qty 1

## 2019-07-24 NOTE — Progress Notes (Addendum)
  Progress Note    07/24/2019 9:06 AM  Subjective:  Denies any increase in rest pain R foot.   Vitals:   07/24/19 0100 07/24/19 0758  BP: (!) 126/45 (!) 161/66  Pulse: 64 67  Resp: 18 18  Temp: 98.3 F (36.8 C)   SpO2: 94% 97%   Physical Exam: Lungs:  Non labored Extremities:  R foot warm to touch Neurologic: A&O  CBC    Component Value Date/Time   WBC 5.3 07/24/2019 0341   RBC 4.07 (L) 07/24/2019 0341   HGB 13.0 07/24/2019 0341   HCT 40.1 07/24/2019 0341   PLT 203 07/24/2019 0341   MCV 98.5 07/24/2019 0341   MCH 31.9 07/24/2019 0341   MCHC 32.4 07/24/2019 0341   RDW 12.9 07/24/2019 0341   LYMPHSABS 1.1 01/09/2018 1600   MONOABS 1.1 (H) 01/09/2018 1600   EOSABS 0.0 01/09/2018 1600   BASOSABS 0.0 01/09/2018 1600    BMET    Component Value Date/Time   NA 136 07/23/2019 0145   K 4.4 07/23/2019 0145   CL 102 07/23/2019 0145   CO2 23 07/23/2019 0145   GLUCOSE 94 07/23/2019 0145   BUN 23 07/23/2019 0145   CREATININE 1.55 (H) 07/23/2019 0145   CALCIUM 8.7 (L) 07/23/2019 0145   GFRNONAA 42 (L) 07/23/2019 0145   GFRAA 49 (L) 07/23/2019 0145    INR    Component Value Date/Time   INR 1.2 07/23/2019 0145     Intake/Output Summary (Last 24 hours) at 07/24/2019 0906 Last data filed at 07/24/2019 0846 Gross per 24 hour  Intake 2137.12 ml  Output 1250 ml  Net 887.12 ml     Assessment/Plan:  79 y.o. male with occluded RLE bypass  RLE bypass occluded with 0.4 ABI Plan is for aortogram with RLE runoff and possible intervention tomorrow with Dr. [name] Continue IV heparin Consent NPO past midnight   Dagoberto Ligas, PA-C [facility] [phone] 07/24/2019 9:06 AM  I have independently interviewed and examined patient and agree with PA assessment and plan above. Bypass graft occluded by history is subacute/chronic.  He has critical right lower extremity ischemia with rest pain.  Plan for angiogram tomorrow. He is scheduled for 2nd dose  of covid vaccine on Wednesday.   Catrice Zuleta C. [name], MD [facility] [place] Office: [phone] Pager: [phone]

## 2019-07-24 NOTE — Progress Notes (Addendum)
[place] for Heparin Indication: PAD  Allergies  Allergen Reactions  . Oxycodone Nausea Only  . Statins Other (See Comments)    Muscle and Bone pain Muscle and Bone pain    Patient Measurements: Height: 5\' 9"  (175.3 cm) Weight: 186 lb 11.7 oz (84.7 kg) IBW/kg (Calculated) : 70.7  Heparin dosing weight: 81.9 kg  Vital Signs: Temp: 98.3 F (36.8 C) (01/31 0100) Temp Source: Oral (01/31 0100) BP: 161/66 (01/31 0758) Pulse Rate: 67 (01/31 0758)  Labs: Recent Labs    07/23/19 0145 07/23/19 0807 07/24/19 0341  HGB 13.1  --  13.0  HCT 39.9  --  40.1  PLT 196  --  203  APTT 120* 101* 91*  LABPROT 14.7  --   --   INR 1.2  --   --   HEPARINUNFRC  --  1.40* 0.92*  CREATININE 1.55*  --   --     Estimated Creatinine Clearance: 38.6 mL/min (A) (by C-G formula based on SCr of 1.55 mg/dL (H)).   Medical History: Past Medical History:  Diagnosis Date  . Arthritis    "knees, elbows" (03/18/2018)  . Chronic edema    a. Chronic RLE edema.  Marland Kitchen COPD (chronic obstructive pulmonary disease) ([place])   . Coronary artery disease    a. MI s/p balloon 1996, details unclear.  Marland Kitchen Dysrhythmia    PROXIMAL MARGIN FIBRILATION  . GERD (gastroesophageal reflux disease)   . Hyperlipidemia   . Hypertension   . Myocardial infarction ([place]) 1996  . PAD (peripheral artery disease) ([place])    a. s/p stenting 08/2012, 02/2013.   Marland Kitchen PAF (paroxysmal atrial fibrillation) ([place])   . Sleep apnea    "have mask; have to start wearing it" (03/18/2018)  . Stroke [facility]) ~ 2014   denies residual on 03/18/2018  . Thrombosis of lower extremity    a. Listed on patient's medical bracelet - at [place].  Marland Kitchen Type II diabetes mellitus (HCC)     Medications:  No current facility-administered medications on file prior to encounter.   Current Outpatient Medications on File Prior to Encounter  Medication Sig Dispense Refill  . acetaminophen (TYLENOL) 500 MG tablet Take 500 mg by mouth 2  (two) times daily as needed (for pain or headaches).     Marland Kitchen amiodarone (PACERONE) 400 MG tablet Take 1 tablet (400 mg total) by mouth 2 (two) times daily. 28 tablet 0  . apixaban (ELIQUIS) 5 MG TABS tablet Take 5 mg by mouth 2 (two) times daily.    . clopidogrel (PLAVIX) 75 MG tablet Take 1 tablet (75 mg total) by mouth daily. 90 tablet 1  . ezetimibe (ZETIA) 10 MG tablet Take 5 mg by mouth daily.    . memantine (NAMENDA) 10 MG tablet Take 10 mg by mouth 2 (two) times daily.    . metoprolol (LOPRESSOR) 50 MG tablet Take 50 mg by mouth 2 (two) times daily.    . nitroGLYCERIN (NITROSTAT) 0.4 MG SL tablet Place 0.4 mg under the tongue every 5 (five) minutes as needed for chest pain.    . pantoprazole (PROTONIX) 40 MG tablet Take 40 mg by mouth daily.    . ramipril (ALTACE) 10 MG capsule Take 10 mg by mouth 2 (two) times daily.    . rivastigmine (EXELON) 6 MG capsule Take 6 mg by mouth 2 (two) times daily.       Assessment: 79 y.o. male with RLE fem-pop graft thrombosis for heparin. Eliquis prior  to admission --last dose 0700 1/29.  Started on heparin 1500 units/hr at [facility] at unknown time.   APTT therapeutic after decreasing drip rate slightly to 1050 units/hr yesterday. Heparin level remains falsely elevated given recent Eliquis administration. CBC wnl. No overt bleeding per patient or infusion issues noted.   Will continue to dose heparin based off aPTT levels given that heparin level is not yet correlating.  2PM Update: RN called saying that there is some blood leaking back into the heparin IV bag. It seems to be manageable currently, however it is a new finding. Will continue IV heparin at current drip rate and monitor for changes in bleeding closely.     Goal of Therapy:  APTT 66-102 Heparin level 0.3-0.7 units/ml Monitor platelets by anticoagulation protocol: Yes   Plan:  Continue heparin IV at 1050 units/hr Daily aPTT, heparin level, and CBC Monitor for  bleeding  Richardine Service, PharmD PGY1 Pharmacy Resident Phone: [phone] 07/24/2019  8:47 AM  Please check [email or web address] for unit-specific pharmacy phone numbers.

## 2019-07-25 ENCOUNTER — Encounter (HOSPITAL_COMMUNITY)
Admission: EM | Disposition: A | Discharge status: Home / Self Care | Payer: Self-pay | Source: Other Acute Inpatient Hospital | Attending: Vascular Surgery

## 2019-07-25 HISTORY — PX: ABDOMINAL AORTOGRAM W/LOWER EXTREMITY: CATH118223

## 2019-07-25 HISTORY — PX: PERIPHERAL VASCULAR INTERVENTION: CATH118257

## 2019-07-25 LAB — BASIC METABOLIC PANEL
Anion gap: 11 (ref 5–15)
BUN: 16 mg/dL (ref 8–23)
CO2: 22 mmol/L (ref 22–32)
Calcium: 8.6 mg/dL — ABNORMAL LOW (ref 8.9–10.3)
Chloride: 109 mmol/L (ref 98–111)
Creatinine, Ser: 1.34 mg/dL — ABNORMAL HIGH (ref 0.61–1.24)
GFR calc Af Amer: 58 mL/min — ABNORMAL LOW (ref >60)
GFR calc non Af Amer: 50 mL/min — ABNORMAL LOW (ref >60)
Glucose, Bld: 100 mg/dL — ABNORMAL HIGH (ref 70–99)
Potassium: 4.4 mmol/L (ref 3.5–5.1)
Sodium: 142 mmol/L (ref 135–145)

## 2019-07-25 LAB — POCT ACTIVATED CLOTTING TIME
Activated Clotting Time: 246 seconds
Activated Clotting Time: 208 seconds
Activated Clotting Time: 180 seconds

## 2019-07-25 LAB — CBC
HCT: 37.8 % — ABNORMAL LOW (ref 39.0–52.0)
Hemoglobin: 12.3 g/dL — ABNORMAL LOW (ref 13.0–17.0)
MCH: 31.9 pg (ref 26.0–34.0)
MCHC: 32.5 g/dL (ref 30.0–36.0)
MCV: 98.2 fL (ref 80.0–100.0)
Platelets: 188 10*3/uL (ref 150–400)
RBC: 3.85 MIL/uL — ABNORMAL LOW (ref 4.22–5.81)
RDW: 13.1 % (ref 11.5–15.5)
WBC: 4.3 10*3/uL (ref 4.0–10.5)
nRBC: 0 % (ref 0.0–0.2)

## 2019-07-25 LAB — HEPARIN LEVEL (UNFRACTIONATED): Heparin Unfractionated: 0.49 IU/mL (ref 0.30–0.70)

## 2019-07-25 LAB — SURGICAL PCR SCREEN
MRSA, PCR: NEGATIVE
Staphylococcus aureus: NEGATIVE

## 2019-07-25 LAB — APTT: aPTT: 85 seconds — ABNORMAL HIGH (ref 24–36)

## 2019-07-25 SURGERY — ABDOMINAL AORTOGRAM W/LOWER EXTREMITY
Anesthesia: LOCAL | Laterality: Right

## 2019-07-25 MED ORDER — HEPARIN (PORCINE) IN NACL 1000-0.9 UT/500ML-% IV SOLN
INTRAVENOUS | Status: DC | PRN
  Administered 2019-07-25 (×2): 500 mL

## 2019-07-25 MED ORDER — IODIXANOL 320 MG/ML IV SOLN
INTRAVENOUS | Status: DC | PRN
  Administered 2019-07-25: 150 mL

## 2019-07-25 MED ORDER — SODIUM CHLORIDE 0.9% FLUSH
3.0000 mL | Freq: Two times a day (BID) | INTRAVENOUS | Status: DC
  Administered 2019-07-26: 09:00:00 3 mL via INTRAVENOUS

## 2019-07-25 MED ORDER — HEPARIN SODIUM (PORCINE) 1000 UNIT/ML IJ SOLN
INTRAMUSCULAR | Status: DC | PRN
  Administered 2019-07-25: 9000 [IU] via INTRAVENOUS

## 2019-07-25 MED ORDER — ACETAMINOPHEN 325 MG PO TABS: 650.0000 mg | ORAL_TABLET | ORAL | Status: DC | PRN

## 2019-07-25 MED ORDER — HEPARIN (PORCINE) 25000 UT/250ML-% IV SOLN
1050.0000 [IU]/h | INTRAVENOUS | Status: DC
  Administered 2019-07-25: 1050 [IU]/h via INTRAVENOUS
  Filled 2019-07-25: qty 250

## 2019-07-25 MED ORDER — SODIUM CHLORIDE 0.9 % IV SOLN: 250.0000 mL | INTRAVENOUS | Status: DC | PRN

## 2019-07-25 MED ORDER — MIDAZOLAM HCL 2 MG/2ML IJ SOLN
INTRAMUSCULAR | Status: AC
  Filled 2019-07-25: qty 2

## 2019-07-25 MED ORDER — MIDAZOLAM HCL 2 MG/2ML IJ SOLN
INTRAMUSCULAR | Status: DC | PRN
  Administered 2019-07-25: 1 mg via INTRAVENOUS

## 2019-07-25 MED ORDER — HEPARIN (PORCINE) IN NACL 1000-0.9 UT/500ML-% IV SOLN
INTRAVENOUS | Status: AC
  Filled 2019-07-25: qty 500

## 2019-07-25 MED ORDER — MUPIROCIN 2 % EX OINT
1.0000 "application " | TOPICAL_OINTMENT | Freq: Two times a day (BID) | CUTANEOUS | Status: DC
  Administered 2019-07-25 – 2019-07-26 (×3): 1 via NASAL
  Filled 2019-07-25: qty 22

## 2019-07-25 MED ORDER — SODIUM CHLORIDE 0.9 % IV SOLN: INTRAVENOUS | Status: AC

## 2019-07-25 MED ORDER — LIDOCAINE HCL (PF) 1 % IJ SOLN
INTRAMUSCULAR | Status: AC
  Filled 2019-07-25: qty 30

## 2019-07-25 MED ORDER — HYDRALAZINE HCL 20 MG/ML IJ SOLN: 5.0000 mg | INTRAMUSCULAR | Status: DC | PRN

## 2019-07-25 MED ORDER — FENTANYL CITRATE (PF) 100 MCG/2ML IJ SOLN
INTRAMUSCULAR | Status: AC
  Filled 2019-07-25: qty 2

## 2019-07-25 MED ORDER — LIDOCAINE HCL (PF) 1 % IJ SOLN
INTRAMUSCULAR | Status: DC | PRN
  Administered 2019-07-25: 14 mL

## 2019-07-25 MED ORDER — HEPARIN SODIUM (PORCINE) 1000 UNIT/ML IJ SOLN
INTRAMUSCULAR | Status: AC
  Filled 2019-07-25: qty 1

## 2019-07-25 MED ORDER — ONDANSETRON HCL 4 MG/2ML IJ SOLN: 4.0000 mg | Freq: Four times a day (QID) | INTRAMUSCULAR | Status: DC | PRN

## 2019-07-25 MED ORDER — FENTANYL CITRATE (PF) 100 MCG/2ML IJ SOLN
INTRAMUSCULAR | Status: DC | PRN
  Administered 2019-07-25 (×2): 50 ug via INTRAVENOUS

## 2019-07-25 MED ORDER — SODIUM CHLORIDE 0.9% FLUSH: 3.0000 mL | INTRAVENOUS | Status: DC | PRN

## 2019-07-25 MED ORDER — LABETALOL HCL 5 MG/ML IV SOLN: 10.0000 mg | INTRAVENOUS | Status: DC | PRN

## 2019-07-25 MED ORDER — SODIUM CHLORIDE 0.9 % IV SOLN: INTRAVENOUS | Status: DC

## 2019-07-25 SURGICAL SUPPLY — 22 items
BALLN STERLING OTW 4X220X150 (BALLOONS) ×3
BALLN STERLING OTW 5X220X150 (BALLOONS) ×3
BALLOON STERLING OTW 4X220X150 (BALLOONS) ×2 IMPLANT
BALLOON STERLING OTW 5X220X150 (BALLOONS) ×2 IMPLANT
CATH ANGIO 5F BER2 65CM (CATHETERS) ×3 IMPLANT
CATH OMNI FLUSH 5F 65CM (CATHETERS) ×3 IMPLANT
CATH QUICKCROSS .035X135CM (MICROCATHETER) ×3 IMPLANT
GLIDEWIRE ADV .035X260CM (WIRE) ×3 IMPLANT
KIT ENCORE 26 ADVANTAGE (KITS) ×3 IMPLANT
KIT MICROPUNCTURE NIT STIFF (SHEATH) ×3 IMPLANT
KIT PV (KITS) ×3 IMPLANT
SHEATH FLEXOR ANSEL 1 7F 45CM (SHEATH) ×3 IMPLANT
SHEATH PINNACLE 5F 10CM (SHEATH) ×3 IMPLANT
SHEATH PROBE COVER 6X72 (BAG) ×3 IMPLANT
SHIELD RADPAD SCOOP 12X17 (MISCELLANEOUS) ×3 IMPLANT
STENT VIABAHN 5X250X120 (Permanent Stent) ×6 IMPLANT
STENT VIABAHN 6X50X120 (Permanent Stent) ×3 IMPLANT
SYR MEDRAD MARK V 150ML (SYRINGE) ×3 IMPLANT
TRANSDUCER W/STOPCOCK (MISCELLANEOUS) ×3 IMPLANT
TRAY PV CATH (CUSTOM PROCEDURE TRAY) ×3 IMPLANT
WIRE BENTSON .035X145CM (WIRE) ×3 IMPLANT
WIRE G V18X300CM (WIRE) ×3 IMPLANT

## 2019-07-25 NOTE — Progress Notes (Signed)
Pt received from cath lab. VSS. L groin level 0. Pt educated on 4 hour bed rest. Call light in reach.  Clyde Canterbury, RN

## 2019-07-25 NOTE — Progress Notes (Signed)
Site area: right groin  Site Prior to Removal:  Level 0  Pressure Applied For 30  MINUTES    Minutes Beginning at 1400  Manual:   Yes.    Patient Status During Pull:  Stable  Post Pull Groin Site:  Level 0  Post Pull Instructions Given:  Yes.    Post Pull Pulses Present:  Yes.    Dressing Applied:  Yes.    Comments:  Bed rest started at 14.0 X 4 hr.

## 2019-07-25 NOTE — Progress Notes (Signed)
  Progress Note    07/25/2019 9:28 AM * No surgery date entered *  Subjective: No overnight issues.  Vitals:   07/25/19 0912 07/25/19 0913  BP:    Pulse: 60 (!) 52  Resp:    Temp:    SpO2:      Physical Exam: Awake alert oriented Nonlabored respirations Right foot is warm no ulceration  CBC    Component Value Date/Time   WBC 4.3 07/25/2019 0449   RBC 3.85 (L) 07/25/2019 0449   HGB 12.3 (L) 07/25/2019 0449   HCT 37.8 (L) 07/25/2019 0449   PLT 188 07/25/2019 0449   MCV 98.2 07/25/2019 0449   MCH 31.9 07/25/2019 0449   MCHC 32.5 07/25/2019 0449   RDW 13.1 07/25/2019 0449   LYMPHSABS 1.1 01/09/2018 1600   MONOABS 1.1 (H) 01/09/2018 1600   EOSABS 0.0 01/09/2018 1600   BASOSABS 0.0 01/09/2018 1600    BMET    Component Value Date/Time   NA 142 07/25/2019 0714   K 4.4 07/25/2019 0714   CL 109 07/25/2019 0714   CO2 22 07/25/2019 0714   GLUCOSE 100 (H) 07/25/2019 0714   BUN 16 07/25/2019 0714   CREATININE 1.34 (H) 07/25/2019 0714   CALCIUM 8.6 (L) 07/25/2019 0714   GFRNONAA 50 (L) 07/25/2019 0714   GFRAA 58 (L) 07/25/2019 0714    INR    Component Value Date/Time   INR 1.2 07/23/2019 0145     Intake/Output Summary (Last 24 hours) at 07/25/2019 0928 Last data filed at 07/25/2019 0849 Gross per 24 hour  Intake 554.51 ml  Output 1150 ml  Net -595.49 ml     Assessment:  79 y.o. male is subacute on chronic right lower extremity rest pain with severely depressed ABIs and occluded right femoropopliteal bypass.  Plan: PV lab today for angiography with possible intervention   Lily Velasquez C. [name], MD [facility] [place] Office: [phone] Pager: [phone]  07/25/2019 9:28 AM

## 2019-07-25 NOTE — Op Note (Signed)
Patient name: Donald Gordon MRN: [id number] DOB: 10-01-1940 Sex: male  07/25/2019 Pre-operative Diagnosis: Critical right lower extremity ischemia with rest pain Post-operative diagnosis:  Same Surgeon:  Eda Paschal. [name], MD Procedure Performed: 1.  Ultrasound-guided cannulation left common femoral artery 2.  Aortogram with bilateral lower extremity runoff 3.  Stent of right tibioperoneal trunk, popliteal artery, SFA with distal 5 x 25 cm Viabahn x2 and proximal 6 x 5 cm Viabahn 4.  Moderate sedation with fentanyl and Versed for 66 minutes  Indications: 79 year old male has previously undergone right femoral to above-knee popliteal artery bypass with harvested vein from the same leg.  He also has history of iliac stenting of which I do not have the records for either of these above-noted procedures.  He subsequently had thrombosis right lower extremity underwent right lower extremity thromboembolectomy with redo bypass of common femoral to below-knee popliteal with graft and 4 compartment fasciotomies.  He now presents with rest pain was noted to have occluded graft in his right lower extremity and ABIs in the severe range.  He is now indicated for angiography possible intervention.  Findings: Aorta and iliac segments are free of flow-limiting stenosis.  There appears to be 2 right external iliac artery stents in 1 left common iliac artery stents that are patent.  Right common femoral artery is patent in the profunda runs off in a straight line.  He reconstitutes above-knee popliteal artery but this is heavily diseased.  Below the knee he has runoff via the posterior tibial artery.  After restenting of his previous PTFE graft we now have flow inline to the foot via peroneal and posterior tibial arteries were once it was occluded there is no stenosis noted.  Left lower extremity appears to have inline flow via SFA, popliteal, 3 vessels to the foot.   Procedure:  The patient was identified in the  holding area and taken to room 8.  The patient was then placed supine on the table and prepped and draped in the usual sterile fashion.  A time out was called.  Ultrasound was used to evaluate the left common femoral artery.  This was heavily calcified.  It was cannulated with direct ultrasound visualization micropuncture needle followed by wire and sheath.  Images saved the permanent record.  Bentson wire was placed followed by 5 [place] sheath.  Omni catheter was placed level of L1 followed by aortogram and bilateral lower extremity runoff.  With the above findings we elected to cross the bifurcation.  Across this with Bentson Omni performed angled views of the right groin which demonstrated takeoff what was likely the previous PTFE graft.  I placed a Glidewire advantage into the profunda femoris artery and then crossed bifurcation with 7 [place] sheath.  Patient was fully heparinized at this time.  We used very catheter and Glidewire vanished to access the sheath.  Using quick cross and Glidewire advantage we then got to the below-knee popliteal and tibioperoneal trunk and demonstrated intraluminal access.  I then elected to primarily stent.  This was done with a 5 mm distally there was 25cm long extended with the similar 5 x 25 cm stent.  This did not reach the takeoff of the graft.  We performed angled angiography and then extended with a 6 x 5 cm stent graft there.  These were postdilated distally with 4 mm balloon into the tibioperoneal trunk approximately 5 mm balloons.  Completion demonstrated runoff without residual stenosis to the posterior tibial and peroneal fill  the foot.  He had good signal at the level of the ankle.  Satisfied with this the wires were removed.  Sheath was retracted to the left external iliac artery will be pulled in postoperative holding.  He tolerated procedure without any complication.  Contrast: 150 cc    [name] C. [name], MD [facility]  [place] Office: [phone] Pager: [phone]

## 2019-07-25 NOTE — Progress Notes (Signed)
[place] for Heparin Indication: PAD  Allergies  Allergen Reactions  . Oxycodone Nausea Only  . Statins Other (See Comments)    Muscle and Bone pain Muscle and Bone pain    Patient Measurements: Height: 5\' 9"  (175.3 cm) Weight: 186 lb 11.7 oz (84.7 kg) IBW/kg (Calculated) : 70.7  Heparin dosing weight: 81.9 kg  Vital Signs: Temp: 98.4 F (36.9 C) (02/01 0445) Temp Source: Oral (02/01 0445) BP: 133/65 (02/01 0445)  Labs: Recent Labs    07/23/19 0145 07/23/19 0145 07/23/19 0807 07/24/19 0341 07/25/19 0449  HGB 13.1   < >  --  13.0 12.3*  HCT 39.9  --   --  40.1 37.8*  PLT 196  --   --  203 188  APTT 120*   < > 101* 91* 85*  LABPROT 14.7  --   --   --   --   INR 1.2  --   --   --   --   HEPARINUNFRC  --   --  1.40* 0.92* 0.49  CREATININE 1.55*  --   --   --   --    < > = values in this interval not displayed.    Estimated Creatinine Clearance: 38.6 mL/min (A) (by C-G formula based on SCr of 1.55 mg/dL (H)).   Medical History: Past Medical History:  Diagnosis Date  . Arthritis    "knees, elbows" (03/18/2018)  . Chronic edema    a. Chronic RLE edema.  Marland Kitchen COPD (chronic obstructive pulmonary disease) ([place])   . Coronary artery disease    a. MI s/p balloon 1996, details unclear.  Marland Kitchen Dysrhythmia    PROXIMAL MARGIN FIBRILATION  . GERD (gastroesophageal reflux disease)   . Hyperlipidemia   . Hypertension   . Myocardial infarction ([place]) 1996  . PAD (peripheral artery disease) ([place])    a. s/p stenting 08/2012, 02/2013.   Marland Kitchen PAF (paroxysmal atrial fibrillation) ([place])   . Sleep apnea    "have mask; have to start wearing it" (03/18/2018)  . Stroke [facility]) ~ 2014   denies residual on 03/18/2018  . Thrombosis of lower extremity    a. Listed on patient's medical bracelet - at [place].  Marland Kitchen Type II diabetes mellitus (HCC)     Medications:  No current facility-administered medications on file prior to encounter.   Current Outpatient  Medications on File Prior to Encounter  Medication Sig Dispense Refill  . acetaminophen (TYLENOL) 500 MG tablet Take 500 mg by mouth 2 (two) times daily as needed (for pain or headaches).     Marland Kitchen amiodarone (PACERONE) 400 MG tablet Take 1 tablet (400 mg total) by mouth 2 (two) times daily. 28 tablet 0  . apixaban (ELIQUIS) 5 MG TABS tablet Take 5 mg by mouth 2 (two) times daily.    . clopidogrel (PLAVIX) 75 MG tablet Take 1 tablet (75 mg total) by mouth daily. 90 tablet 1  . ezetimibe (ZETIA) 10 MG tablet Take 5 mg by mouth daily.    . memantine (NAMENDA) 10 MG tablet Take 10 mg by mouth 2 (two) times daily.    . metoprolol (LOPRESSOR) 50 MG tablet Take 50 mg by mouth 2 (two) times daily.    . nitroGLYCERIN (NITROSTAT) 0.4 MG SL tablet Place 0.4 mg under the tongue every 5 (five) minutes as needed for chest pain.    . pantoprazole (PROTONIX) 40 MG tablet Take 40 mg by mouth daily.    . ramipril (  ALTACE) 10 MG capsule Take 10 mg by mouth 2 (two) times daily.    . rivastigmine (EXELON) 6 MG capsule Take 6 mg by mouth 2 (two) times daily.       Assessment: 79 y.o. male with RLE fem-pop graft thrombosis for heparin. Eliquis prior to admission --last dose 0700 1/29.  Started on heparin 1500 units/hr at [facility] at unknown time.   APTT therapeutic after decreasing drip rate slightly to 1050 units/hr yesterday. CBC wnl. No overt bleeding per patient or infusion issues noted.   Will continue to dose heparin based off aPTT levels given that heparin level is not yet fully correlating given recent Eliquis administration.    Goal of Therapy:  APTT 66-102 Heparin level 0.3-0.7 units/ml Monitor platelets by anticoagulation protocol: Yes   Plan:  Continue heparin IV at 1050 units/hr Daily aPTT, heparin level, and CBC Monitor for bleeding F/u plans for anticoagulation after OR today.  Marguerite Olea, [facility] Clinical Pharmacist Phone [phone]  07/25/2019 8:16 AM

## 2019-07-25 NOTE — Progress Notes (Signed)
[place] for Heparin Indication: PAD  Allergies  Allergen Reactions  . Oxycodone Nausea Only  . Statins Other (See Comments)    Muscle and Bone pain Muscle and Bone pain    Patient Measurements: Height: 5\' 9"  (175.3 cm) Weight: 186 lb 11.7 oz (84.7 kg) IBW/kg (Calculated) : 70.7  Heparin dosing weight: 81.9 kg  Vital Signs: Temp: 97.6 F (36.4 C) (02/01 0854) Temp Source: Oral (02/01 0854) BP: 146/51 (02/01 1310) Pulse Rate: 57 (02/01 1310)  Labs: Recent Labs    07/23/19 0145 07/23/19 0145 07/23/19 0807 07/24/19 0341 07/25/19 0449 07/25/19 0714  HGB 13.1   < >  --  13.0 12.3*  --   HCT 39.9  --   --  40.1 37.8*  --   PLT 196  --   --  203 188  --   APTT 120*   < > 101* 91* 85*  --   LABPROT 14.7  --   --   --   --   --   INR 1.2  --   --   --   --   --   HEPARINUNFRC  --   --  1.40* 0.92* 0.49  --   CREATININE 1.55*  --   --   --   --  1.34*   < > = values in this interval not displayed.    Estimated Creatinine Clearance: 44.7 mL/min (A) (by C-G formula based on SCr of 1.34 mg/dL (H)).   Medical History: Past Medical History:  Diagnosis Date  . Arthritis    "knees, elbows" (03/18/2018)  . Chronic edema    a. Chronic RLE edema.  Marland Kitchen COPD (chronic obstructive pulmonary disease) ([place])   . Coronary artery disease    a. MI s/p balloon 1996, details unclear.  Marland Kitchen Dysrhythmia    PROXIMAL MARGIN FIBRILATION  . GERD (gastroesophageal reflux disease)   . Hyperlipidemia   . Hypertension   . Myocardial infarction ([place]) 1996  . PAD (peripheral artery disease) ([place])    a. s/p stenting 08/2012, 02/2013.   Marland Kitchen PAF (paroxysmal atrial fibrillation) ([place])   . Sleep apnea    "have mask; have to start wearing it" (03/18/2018)  . Stroke [facility]) ~ 2014   denies residual on 03/18/2018  . Thrombosis of lower extremity    a. Listed on patient's medical bracelet - at [place].  Marland Kitchen Type II diabetes mellitus (HCC)     Medications:  No current  facility-administered medications on file prior to encounter.   Current Outpatient Medications on File Prior to Encounter  Medication Sig Dispense Refill  . acetaminophen (TYLENOL) 500 MG tablet Take 500 mg by mouth 2 (two) times daily as needed (for pain or headaches).     Marland Kitchen amiodarone (PACERONE) 400 MG tablet Take 1 tablet (400 mg total) by mouth 2 (two) times daily. 28 tablet 0  . apixaban (ELIQUIS) 5 MG TABS tablet Take 5 mg by mouth 2 (two) times daily.    . clopidogrel (PLAVIX) 75 MG tablet Take 1 tablet (75 mg total) by mouth daily. 90 tablet 1  . ezetimibe (ZETIA) 10 MG tablet Take 5 mg by mouth daily.    . memantine (NAMENDA) 10 MG tablet Take 10 mg by mouth 2 (two) times daily.    . metoprolol (LOPRESSOR) 50 MG tablet Take 50 mg by mouth 2 (two) times daily.    . nitroGLYCERIN (NITROSTAT) 0.4 MG SL tablet Place 0.4 mg under the tongue  every 5 (five) minutes as needed for chest pain.    . pantoprazole (PROTONIX) 40 MG tablet Take 40 mg by mouth daily.    . ramipril (ALTACE) 10 MG capsule Take 10 mg by mouth 2 (two) times daily.    . rivastigmine (EXELON) 6 MG capsule Take 6 mg by mouth 2 (two) times daily.       Assessment: 79 y.o. male with RLE fem-pop graft thrombosis for heparin. Eliquis prior to admission --last dose 0700 1/29.  Started on heparin 1500 units/hr at [facility] at unknown time.   APTT therapeutic after decreasing drip rate slightly to 1050 units/hr yesterday. CBC wnl. No overt bleeding per patient or infusion issues noted.   Will continue to dose heparin based off aPTT levels given that heparin level is not yet fully correlating given recent Eliquis administration.  PM f/u - pharmacy asked to restart heparin 8 hrs after sheath removed.  Sheath out at 1400 PM.    Goal of Therapy:  APTT 66-102 Heparin level 0.3-0.7 units/ml Monitor platelets by anticoagulation protocol: Yes   Plan:  Restart IV heparin at previous rate of 1050 units/hr at 2200 pm tonight.   Check heparin level 8 hrs after restart. Daily aPTT, heparin level, and CBC Monitor for bleeding F/u plans for oral anticoagulation eventually.  Marguerite Olea, [facility] Clinical Pharmacist Phone [phone]  07/25/2019 2:59 PM

## 2019-07-25 NOTE — Progress Notes (Deleted)
Site area: right groin  Site Prior to Removal:  Level 0  Pressure Applied For 0  MINUTES    Minutes Beginning at 1400   Manual:   Yes.    Patient Status During Pull:  Stable   Post Pull Groin Site:  Level 0  Post Pull Instructions Given:  Yes.    Post Pull Pulses Present:  Yes.    Dressing Applied:  Yes.    Comments:  Bed rest started at 1430 X 4 hr.

## 2019-07-26 LAB — BASIC METABOLIC PANEL
Anion gap: 7 (ref 5–15)
BUN: 14 mg/dL (ref 8–23)
CO2: 23 mmol/L (ref 22–32)
Calcium: 8.3 mg/dL — ABNORMAL LOW (ref 8.9–10.3)
Chloride: 108 mmol/L (ref 98–111)
Creatinine, Ser: 1.96 mg/dL — ABNORMAL HIGH (ref 0.61–1.24)
GFR calc Af Amer: 37 mL/min — ABNORMAL LOW (ref >60)
GFR calc non Af Amer: 32 mL/min — ABNORMAL LOW (ref >60)
Glucose, Bld: 101 mg/dL — ABNORMAL HIGH (ref 70–99)
Potassium: 4.5 mmol/L (ref 3.5–5.1)
Sodium: 138 mmol/L (ref 135–145)

## 2019-07-26 LAB — HEPARIN LEVEL (UNFRACTIONATED): Heparin Unfractionated: 0.31 IU/mL (ref 0.30–0.70)

## 2019-07-26 LAB — CBC
HCT: 36.3 % — ABNORMAL LOW (ref 39.0–52.0)
Hemoglobin: 11.7 g/dL — ABNORMAL LOW (ref 13.0–17.0)
MCH: 31.7 pg (ref 26.0–34.0)
MCHC: 32.2 g/dL (ref 30.0–36.0)
MCV: 98.4 fL (ref 80.0–100.0)
Platelets: 176 10*3/uL (ref 150–400)
RBC: 3.69 MIL/uL — ABNORMAL LOW (ref 4.22–5.81)
RDW: 13 % (ref 11.5–15.5)
WBC: 4.4 10*3/uL (ref 4.0–10.5)
nRBC: 0 % (ref 0.0–0.2)

## 2019-07-26 LAB — APTT: aPTT: 70 seconds — ABNORMAL HIGH (ref 24–36)

## 2019-07-26 MED ORDER — APIXABAN 5 MG PO TABS
5.0000 mg | ORAL_TABLET | Freq: Two times a day (BID) | ORAL | Status: DC
  Administered 2019-07-26: 5 mg via ORAL
  Filled 2019-07-26: qty 1

## 2019-07-26 NOTE — Progress Notes (Signed)
CSW  Consulted for transportation. Patient states he was a patient at the [facility] hospital and transferred to [facility]. Patient states his car is located at the [place]. Patient states no other transportation to get his car.  Patient has signed Ruthine Dose and Release of Liability  RN please call [employer]  [phone] when patient is ready for transport.  Thurmond Butts, MSW, [place] Clinical Social Worker

## 2019-07-26 NOTE — Plan of Care (Signed)
Continue to monitor

## 2019-07-26 NOTE — Discharge Instructions (Signed)
° °  [facility] ° °Discharge Instructions ° °Lower Extremity Angiogram; Angioplasty/Stenting ° °Please refer to the following instructions for your post-procedure care. Your surgeon or physician assistant will discuss any changes with you. ° °Activity ° °Avoid lifting more than 8 pounds (1 gallons of milk) for 72 hours (3 days) after your procedure. You may walk as much as you can tolerate. It's OK to drive after 72 hours. ° °Bathing/Showering ° °You may shower the day after your procedure. If you have a bandage, you may remove it at 24- 48 hours. Clean your incision site with mild soap and water. Pat the area dry with a clean towel. ° °Diet ° °Resume your pre-procedure diet. There are no special food restrictions following this procedure. All patients with peripheral vascular disease should follow a low fat/low cholesterol diet. In order to heal from your surgery, it is CRITICAL to get adequate nutrition. Your body requires vitamins, minerals, and protein. Vegetables are the best source of vitamins and minerals. Vegetables also provide the perfect balance of protein. Processed food has little nutritional value, so try to avoid this. ° °Medications ° °Resume taking all of your medications unless your doctor tells you not to. If your incision is causing pain, you may take over-the-counter pain relievers such as acetaminophen (Tylenol) ° °Follow Up ° °Follow up will be arranged at the time of your procedure. You may have an office visit scheduled or may be scheduled for surgery. Ask your surgeon if you have any questions. ° °Please call us immediately for any of the following conditions: °•Severe or worsening pain your legs or feet at rest or with walking. °•Increased pain, redness, drainage at your groin puncture site. °•Fever of 101 degrees or higher. °•If you have any mild or slow bleeding from your puncture site: lie down, apply firm constant pressure over the area with a piece of  gauze or a clean wash cloth for 30 minutes- no peeking!, call 911 right away if you are still bleeding after 30 minutes, or if the bleeding is heavy and unmanageable. ° °Reduce your risk factors of vascular disease: ° °Stop smoking. If you would like help call [facility] at 1-800-QUIT-NOW (1-800-784-8669) or [place] at 336-586-4000. °Manage your cholesterol °Maintain a desired weight °Control your diabetes °Keep your blood pressure down ° °If you have any questions, please call the office at [phone] ° °

## 2019-07-26 NOTE — Progress Notes (Addendum)
  Progress Note    07/26/2019 7:58 AM 1 Day Post-Op  Subjective:  R foot feels "warmer" per patient since procedure yesterday   Vitals:   07/25/19 1953 07/26/19 0505  BP: (!) 134/53 (!) 142/48  Pulse: (!) 59 60  Resp: 17 19  Temp: (!) 97.5 F (36.4 C) 97.9 F (36.6 C)  SpO2: 93% 98%   Physical Exam: Lungs:  Non labored  Incisions:  L groin sore to palpation but soft and no palpable hematoma Extremities:  Palpable R PT pulse Neurologic: A&O  CBC    Component Value Date/Time   WBC 4.4 07/26/2019 0607   RBC 3.69 (L) 07/26/2019 0607   HGB 11.7 (L) 07/26/2019 0607   HCT 36.3 (L) 07/26/2019 0607   PLT 176 07/26/2019 0607   MCV 98.4 07/26/2019 0607   MCH 31.7 07/26/2019 0607   MCHC 32.2 07/26/2019 0607   RDW 13.0 07/26/2019 0607   LYMPHSABS 1.1 01/09/2018 1600   MONOABS 1.1 (H) 01/09/2018 1600   EOSABS 0.0 01/09/2018 1600   BASOSABS 0.0 01/09/2018 1600    BMET    Component Value Date/Time   NA 138 07/26/2019 0607   K 4.5 07/26/2019 0607   CL 108 07/26/2019 0607   CO2 23 07/26/2019 0607   GLUCOSE 101 (H) 07/26/2019 0607   BUN 14 07/26/2019 0607   CREATININE 1.96 (H) 07/26/2019 0607   CALCIUM 8.3 (L) 07/26/2019 0607   GFRNONAA 32 (L) 07/26/2019 0607   GFRAA 37 (L) 07/26/2019 0607    INR    Component Value Date/Time   INR 1.2 07/23/2019 0145     Intake/Output Summary (Last 24 hours) at 07/26/2019 0758 Last data filed at 07/26/2019 0500 Gross per 24 hour  Intake 2801.33 ml  Output 750 ml  Net 2051.33 ml     Assessment/Plan:  79 y.o. male is s/p R TP trunk, pop, SFA stenting 1 Day Post-Op   R foot well perfused with palpable PT pulse L groin cath site unremarkable on exam Transition back to Eliquis from heparin Discharge home this morning; TOC team consulted to provide transportation home Follow up with arterial studies in 4-6 weeks   Dagoberto Ligas, PA-C [facility] [phone] 07/26/2019 7:58 AM   I have independently  interviewed and examined patient and agree with PA assessment and plan above. Right foot is much warmer left groin without hematoma. Patient will resume Eliquis and Plavix. He will follow-up in 4 to 6 weeks with right lower extremity duplex and ABIs.  Anacaren Kohan C. [name], MD [facility] Office: [phone] Pager: [phone]

## 2019-07-26 NOTE — Progress Notes (Addendum)
[place] for Heparin >> apixaban Indication: PAD  Allergies  Allergen Reactions  . Oxycodone Nausea Only  . Statins Other (See Comments)    Muscle and Bone pain Muscle and Bone pain    Patient Measurements: Height: 5\' 9"  (175.3 cm) Weight: 186 lb 11.7 oz (84.7 kg) IBW/kg (Calculated) : 70.7  Heparin dosing weight: 81.9 kg  Vital Signs: Temp: 97.9 F (36.6 C) (02/02 0505) Temp Source: Oral (02/02 0505) BP: 142/48 (02/02 0505) Pulse Rate: 60 (02/02 0505)  Labs: Recent Labs    07/24/19 0341 07/24/19 0341 07/25/19 0449 07/25/19 0714 07/26/19 0607  HGB 13.0   < > 12.3*  --  11.7*  HCT 40.1  --  37.8*  --  36.3*  PLT 203  --  188  --  176  APTT 91*  --  85*  --  70*  HEPARINUNFRC 0.92*  --  0.49  --  0.31  CREATININE  --   --   --  1.34* 1.96*   < > = values in this interval not displayed.    Estimated Creatinine Clearance: 30.6 mL/min (A) (by C-G formula based on SCr of 1.96 mg/dL (H)).   Medical History: Past Medical History:  Diagnosis Date  . Arthritis    "knees, elbows" (03/18/2018)  . Chronic edema    a. Chronic RLE edema.  Marland Kitchen COPD (chronic obstructive pulmonary disease) ([place])   . Coronary artery disease    a. MI s/p balloon 1996, details unclear.  Marland Kitchen Dysrhythmia    PROXIMAL MARGIN FIBRILATION  . GERD (gastroesophageal reflux disease)   . Hyperlipidemia   . Hypertension   . Myocardial infarction ([place]) 1996  . PAD (peripheral artery disease) ([place])    a. s/p stenting 08/2012, 02/2013.   Marland Kitchen PAF (paroxysmal atrial fibrillation) ([place])   . Sleep apnea    "have mask; have to start wearing it" (03/18/2018)  . Stroke [facility]) ~ 2014   denies residual on 03/18/2018  . Thrombosis of lower extremity    a. Listed on patient's medical bracelet - at [place].  Marland Kitchen Type II diabetes mellitus (HCC)     Medications:  No current facility-administered medications on file prior to encounter.   Current Outpatient Medications on File Prior to  Encounter  Medication Sig Dispense Refill  . acetaminophen (TYLENOL) 500 MG tablet Take 500 mg by mouth 2 (two) times daily as needed (for pain or headaches).     Marland Kitchen amiodarone (PACERONE) 400 MG tablet Take 1 tablet (400 mg total) by mouth 2 (two) times daily. 28 tablet 0  . apixaban (ELIQUIS) 5 MG TABS tablet Take 5 mg by mouth 2 (two) times daily.    . clopidogrel (PLAVIX) 75 MG tablet Take 1 tablet (75 mg total) by mouth daily. 90 tablet 1  . ezetimibe (ZETIA) 10 MG tablet Take 5 mg by mouth daily.    . memantine (NAMENDA) 10 MG tablet Take 10 mg by mouth 2 (two) times daily.    . metoprolol (LOPRESSOR) 50 MG tablet Take 50 mg by mouth 2 (two) times daily.    . nitroGLYCERIN (NITROSTAT) 0.4 MG SL tablet Place 0.4 mg under the tongue every 5 (five) minutes as needed for chest pain.    . pantoprazole (PROTONIX) 40 MG tablet Take 40 mg by mouth daily.    . ramipril (ALTACE) 10 MG capsule Take 10 mg by mouth 2 (two) times daily.    . rivastigmine (EXELON) 6 MG capsule Take 6  mg by mouth 2 (two) times daily.       Assessment: 78 y.o. male with RLE fem-pop graft thrombosis for heparin. Eliquis prior to admission --last dose 0700 1/29, started on heparin 1500 units/hr at [facility] at unknown time.   S/p Vascular procedure 2/1- pharmacy asked to restart heparin post-op.  Pharmacy consulted to transition back to apixaban this AM. CBC stable. No active bleed issues documented.    Goal of Therapy:  VTE prevention Monitor platelets by anticoagulation protocol: Yes   Plan:  Stop heparin drip >> resume apixaban 5mg  PO BID (turn off drip at time of 1st dose of apixaban - discussed plan with RN) Monitor CBC, s/sx bleeding   Elicia Lamp, PharmD, BCPS Please check AMION for all [facility] contact numbers Clinical Pharmacist 07/26/2019 8:23 AM

## 2019-07-26 NOTE — Progress Notes (Addendum)
Discharge instructions reviewed patient at this time. All questions answered. Ride waiver scanned and emailed to transportation.

## 2019-07-26 NOTE — Progress Notes (Signed)
Received msg from April at [facility]- pt goes to the [facility] there- PCP- Sinno. CSW there at the clinic is Moore - [phone]- ext. [phone].

## 2019-07-26 NOTE — Discharge Summary (Signed)
  Discharge Summary  Patient ID: Donald Gordon [id number] 78 y.o. 1941/01/21  Admit date: 07/23/2019  Discharge date and time: 07/26/2019 12:33 PM   Admitting Physician: Waynetta Sandy, MD   Discharge Physician: Same  Admission Diagnoses: PAD (peripheral artery disease) [facility]) [I73.9]  Discharge Diagnoses: Same  Admission Condition: poor  Discharged Condition: fair  Indication for Admission: Critical limb ischemia right lower extremity  Hospital Course: Donald Gordon is a 79 year old male who presented to the emergency department with critical ischemia of right lower extremity.  Right femoral to popliteal artery bypass was found to be occluded.  He was started on IV heparin and was admitted to the hospital over the weekend.  On 07/25/2019 he underwent stenting of distal bypass distal SFA, popliteal artery, and TP trunk by Dr. [name] on 07/25/2019.  POD #1 he had a palpable PT pulse in right lower extremity.  He was transitioned back from IV heparin to Eliquis.  He is ambulate well without difficulty and is ready for discharge home.  He will follow-up in office in about 4 to 6 weeks with graft surveillance and ABIs.  Discharge instructions were reviewed with patient and he voices his understanding.  He will be discharged home in stable condition.  Consults: None  Treatments: surgery: Aortogram with right lower extremity runoff and stenting of distal bypass SFA, popliteal, and TP trunk by Dr. [name] on 07/25/2019.  Discharge Exam: See progress note 07/26/2019 Vitals:   07/26/19 1115 07/26/19 1138  BP: (!) 148/58   Pulse:  66  Resp:    Temp:    SpO2:       Disposition: Discharge disposition: 01-Home or Self Care       Patient Instructions:  Allergies as of 07/26/2019      Reactions   Oxycodone Nausea Only   Statins Other (See Comments)   Muscle and Bone pain Muscle and Bone pain      Medication List    TAKE these medications   acetaminophen 500 MG tablet Commonly  known as: TYLENOL Take 500 mg by mouth 2 (two) times daily as needed (for pain or headaches).   amiodarone 400 MG tablet Commonly known as: PACERONE Take 1 tablet (400 mg total) by mouth 2 (two) times daily.   clopidogrel 75 MG tablet Commonly known as: PLAVIX Take 1 tablet (75 mg total) by mouth daily.   Eliquis 5 MG Tabs tablet Generic drug: apixaban Take 5 mg by mouth 2 (two) times daily.   ezetimibe 10 MG tablet Commonly known as: ZETIA Take 5 mg by mouth daily.   memantine 10 MG tablet Commonly known as: NAMENDA Take 10 mg by mouth 2 (two) times daily.   metoprolol tartrate 50 MG tablet Commonly known as: LOPRESSOR Take 50 mg by mouth 2 (two) times daily.   nitroGLYCERIN 0.4 MG SL tablet Commonly known as: NITROSTAT Place 0.4 mg under the tongue every 5 (five) minutes as needed for chest pain.   pantoprazole 40 MG tablet Commonly known as: PROTONIX Take 40 mg by mouth daily.   ramipril 10 MG capsule Commonly known as: ALTACE Take 10 mg by mouth 2 (two) times daily.   rivastigmine 6 MG capsule Commonly known as: EXELON Take 6 mg by mouth 2 (two) times daily.      Activity: activity as tolerated Diet: regular diet Wound Care: none needed  Follow-up with Dr. [name] in 4 weeks.  Signed: Dagoberto Ligas, PA-C 07/26/2019 2:13 PM [facility] Office: [phone]

## 2019-08-03 DIAGNOSIS — D126 Benign neoplasm of colon, unspecified: Secondary | ICD-10-CM | POA: Diagnosis not present

## 2019-08-03 DIAGNOSIS — K31819 Angiodysplasia of stomach and duodenum without bleeding: Secondary | ICD-10-CM | POA: Diagnosis not present

## 2019-08-03 DIAGNOSIS — D500 Iron deficiency anemia secondary to blood loss (chronic): Secondary | ICD-10-CM | POA: Diagnosis not present

## 2019-08-30 ENCOUNTER — Encounter (HOSPITAL_COMMUNITY): Payer: Self-pay | Admitting: Emergency Medicine

## 2019-08-30 ENCOUNTER — Emergency Department (HOSPITAL_COMMUNITY): Payer: No Typology Code available for payment source

## 2019-08-30 ENCOUNTER — Ambulatory Visit (HOSPITAL_COMMUNITY): Payer: Non-veteran care

## 2019-08-30 ENCOUNTER — Other Ambulatory Visit: Payer: Self-pay

## 2019-08-30 ENCOUNTER — Inpatient Hospital Stay (HOSPITAL_COMMUNITY)
Admission: EM | Admit: 2019-08-30 | Discharge: 2019-09-02 | DRG: 253 | Disposition: A | Discharge status: Home / Self Care | Payer: No Typology Code available for payment source | Attending: Surgery | Admitting: Surgery

## 2019-08-30 ENCOUNTER — Encounter (HOSPITAL_COMMUNITY): Payer: Non-veteran care

## 2019-08-30 DIAGNOSIS — Z8673 Personal history of transient ischemic attack (TIA), and cerebral infarction without residual deficits: Secondary | ICD-10-CM | POA: Diagnosis not present

## 2019-08-30 DIAGNOSIS — E7800 Pure hypercholesterolemia, unspecified: Secondary | ICD-10-CM | POA: Diagnosis present

## 2019-08-30 DIAGNOSIS — I2510 Atherosclerotic heart disease of native coronary artery without angina pectoris: Secondary | ICD-10-CM | POA: Diagnosis present

## 2019-08-30 DIAGNOSIS — I998 Other disorder of circulatory system: Secondary | ICD-10-CM | POA: Diagnosis present

## 2019-08-30 DIAGNOSIS — I70221 Atherosclerosis of native arteries of extremities with rest pain, right leg: Principal | ICD-10-CM | POA: Diagnosis present

## 2019-08-30 DIAGNOSIS — I10 Essential (primary) hypertension: Secondary | ICD-10-CM | POA: Diagnosis present

## 2019-08-30 DIAGNOSIS — I252 Old myocardial infarction: Secondary | ICD-10-CM

## 2019-08-30 DIAGNOSIS — Z86718 Personal history of other venous thrombosis and embolism: Secondary | ICD-10-CM | POA: Diagnosis not present

## 2019-08-30 DIAGNOSIS — Z20822 Contact with and (suspected) exposure to covid-19: Secondary | ICD-10-CM | POA: Diagnosis present

## 2019-08-30 DIAGNOSIS — I739 Peripheral vascular disease, unspecified: Secondary | ICD-10-CM | POA: Diagnosis present

## 2019-08-30 DIAGNOSIS — Z79899 Other long term (current) drug therapy: Secondary | ICD-10-CM | POA: Diagnosis not present

## 2019-08-30 DIAGNOSIS — T82898A Other specified complication of vascular prosthetic devices, implants and grafts, initial encounter: Secondary | ICD-10-CM

## 2019-08-30 DIAGNOSIS — J449 Chronic obstructive pulmonary disease, unspecified: Secondary | ICD-10-CM | POA: Diagnosis present

## 2019-08-30 DIAGNOSIS — E785 Hyperlipidemia, unspecified: Secondary | ICD-10-CM | POA: Diagnosis present

## 2019-08-30 DIAGNOSIS — T82868A Thrombosis of vascular prosthetic devices, implants and grafts, initial encounter: Secondary | ICD-10-CM | POA: Diagnosis not present

## 2019-08-30 DIAGNOSIS — I480 Paroxysmal atrial fibrillation: Secondary | ICD-10-CM | POA: Diagnosis present

## 2019-08-30 DIAGNOSIS — Z7901 Long term (current) use of anticoagulants: Secondary | ICD-10-CM | POA: Diagnosis not present

## 2019-08-30 DIAGNOSIS — E1151 Type 2 diabetes mellitus with diabetic peripheral angiopathy without gangrene: Secondary | ICD-10-CM | POA: Diagnosis present

## 2019-08-30 DIAGNOSIS — Z8249 Family history of ischemic heart disease and other diseases of the circulatory system: Secondary | ICD-10-CM | POA: Diagnosis not present

## 2019-08-30 DIAGNOSIS — Z87891 Personal history of nicotine dependence: Secondary | ICD-10-CM | POA: Diagnosis not present

## 2019-08-30 DIAGNOSIS — I70229 Atherosclerosis of native arteries of extremities with rest pain, unspecified extremity: Secondary | ICD-10-CM | POA: Diagnosis present

## 2019-08-30 DIAGNOSIS — Z7902 Long term (current) use of antithrombotics/antiplatelets: Secondary | ICD-10-CM | POA: Diagnosis not present

## 2019-08-30 LAB — COMPREHENSIVE METABOLIC PANEL
ALT: 32 U/L (ref 0–44)
AST: 30 U/L (ref 15–41)
Albumin: 3.8 g/dL (ref 3.5–5.0)
Alkaline Phosphatase: 69 U/L (ref 38–126)
Anion gap: 12 (ref 5–15)
BUN: 22 mg/dL (ref 8–23)
CO2: 23 mmol/L (ref 22–32)
Calcium: 8.8 mg/dL — ABNORMAL LOW (ref 8.9–10.3)
Chloride: 106 mmol/L (ref 98–111)
Creatinine, Ser: 1.47 mg/dL — ABNORMAL HIGH (ref 0.61–1.24)
GFR calc Af Amer: 52 mL/min — ABNORMAL LOW (ref >60)
GFR calc non Af Amer: 45 mL/min — ABNORMAL LOW (ref >60)
Glucose, Bld: 104 mg/dL — ABNORMAL HIGH (ref 70–99)
Potassium: 4.5 mmol/L (ref 3.5–5.1)
Sodium: 141 mmol/L (ref 135–145)
Total Bilirubin: 0.6 mg/dL (ref 0.3–1.2)
Total Protein: 6.4 g/dL — ABNORMAL LOW (ref 6.5–8.1)

## 2019-08-30 LAB — BASIC METABOLIC PANEL
Anion gap: 11 (ref 5–15)
BUN: 20 mg/dL (ref 8–23)
CO2: 24 mmol/L (ref 22–32)
Calcium: 9 mg/dL (ref 8.9–10.3)
Chloride: 107 mmol/L (ref 98–111)
Creatinine, Ser: 1.75 mg/dL — ABNORMAL HIGH (ref 0.61–1.24)
GFR calc Af Amer: 42 mL/min — ABNORMAL LOW (ref >60)
GFR calc non Af Amer: 36 mL/min — ABNORMAL LOW (ref >60)
Glucose, Bld: 158 mg/dL — ABNORMAL HIGH (ref 70–99)
Potassium: 4.5 mmol/L (ref 3.5–5.1)
Sodium: 142 mmol/L (ref 135–145)

## 2019-08-30 LAB — URINALYSIS, ROUTINE W REFLEX MICROSCOPIC
Bilirubin Urine: NEGATIVE
Glucose, UA: NEGATIVE mg/dL
Hgb urine dipstick: NEGATIVE
Ketones, ur: NEGATIVE mg/dL
Leukocytes,Ua: NEGATIVE
Nitrite: NEGATIVE
Protein, ur: NEGATIVE mg/dL
Specific Gravity, Urine: 1.019 (ref 1.005–1.030)
pH: 6 (ref 5.0–8.0)

## 2019-08-30 LAB — RESPIRATORY PANEL BY RT PCR (FLU A&B, COVID)
Influenza A by PCR: NEGATIVE
Influenza B by PCR: NEGATIVE
SARS Coronavirus 2 by RT PCR: NEGATIVE

## 2019-08-30 LAB — CBC
HCT: 41 % (ref 39.0–52.0)
Hemoglobin: 13.1 g/dL (ref 13.0–17.0)
MCH: 31.3 pg (ref 26.0–34.0)
MCHC: 32 g/dL (ref 30.0–36.0)
MCV: 97.9 fL (ref 80.0–100.0)
Platelets: 242 10*3/uL (ref 150–400)
RBC: 4.19 MIL/uL — ABNORMAL LOW (ref 4.22–5.81)
RDW: 13.1 % (ref 11.5–15.5)
WBC: 9 10*3/uL (ref 4.0–10.5)
nRBC: 0 % (ref 0.0–0.2)

## 2019-08-30 LAB — CBG MONITORING, ED: Glucose-Capillary: 91 mg/dL (ref 70–99)

## 2019-08-30 LAB — HEMOGLOBIN A1C
Hgb A1c MFr Bld: 5.7 % — ABNORMAL HIGH (ref 4.8–5.6)
Mean Plasma Glucose: 116.89 mg/dL

## 2019-08-30 LAB — PROTIME-INR
INR: 1.3 — ABNORMAL HIGH (ref 0.8–1.2)
Prothrombin Time: 15.9 seconds — ABNORMAL HIGH (ref 11.4–15.2)

## 2019-08-30 LAB — APTT: aPTT: 31 seconds (ref 24–36)

## 2019-08-30 MED ORDER — RAMIPRIL 10 MG PO CAPS
10.0000 mg | ORAL_CAPSULE | Freq: Two times a day (BID) | ORAL | Status: DC
  Administered 2019-08-30 – 2019-09-02 (×6): 10 mg via ORAL
  Filled 2019-08-30 (×6): qty 1

## 2019-08-30 MED ORDER — INSULIN ASPART 100 UNIT/ML ~~LOC~~ SOLN: 0.0000 [IU] | Freq: Every day | SUBCUTANEOUS | Status: DC

## 2019-08-30 MED ORDER — METOPROLOL TARTRATE 50 MG PO TABS
50.0000 mg | ORAL_TABLET | Freq: Two times a day (BID) | ORAL | Status: DC
  Administered 2019-08-30 – 2019-09-02 (×5): 50 mg via ORAL
  Filled 2019-08-30: qty 1
  Filled 2019-08-30: qty 2
  Filled 2019-08-30: qty 1
  Filled 2019-08-30: qty 2
  Filled 2019-08-30 (×2): qty 1

## 2019-08-30 MED ORDER — CLOPIDOGREL BISULFATE 75 MG PO TABS
75.0000 mg | ORAL_TABLET | Freq: Every day | ORAL | Status: DC
  Administered 2019-08-30 – 2019-09-02 (×4): 75 mg via ORAL
  Filled 2019-08-30 (×4): qty 1

## 2019-08-30 MED ORDER — HYDRALAZINE HCL 20 MG/ML IJ SOLN: 5.0000 mg | INTRAMUSCULAR | Status: DC | PRN

## 2019-08-30 MED ORDER — AMIODARONE HCL 200 MG PO TABS
400.0000 mg | ORAL_TABLET | Freq: Two times a day (BID) | ORAL | Status: DC
  Administered 2019-08-30 – 2019-09-02 (×6): 400 mg via ORAL
  Filled 2019-08-30 (×6): qty 2

## 2019-08-30 MED ORDER — GUAIFENESIN-DM 100-10 MG/5ML PO SYRP: 15.0000 mL | ORAL_SOLUTION | ORAL | Status: DC | PRN

## 2019-08-30 MED ORDER — POTASSIUM CHLORIDE CRYS ER 20 MEQ PO TBCR: 20.0000 meq | EXTENDED_RELEASE_TABLET | Freq: Once | ORAL | Status: DC

## 2019-08-30 MED ORDER — EZETIMIBE 10 MG PO TABS
10.0000 mg | ORAL_TABLET | Freq: Every evening | ORAL | Status: DC
  Administered 2019-08-30 – 2019-08-31 (×2): 10 mg via ORAL
  Filled 2019-08-30 (×3): qty 1

## 2019-08-30 MED ORDER — ALUM & MAG HYDROXIDE-SIMETH 200-200-20 MG/5ML PO SUSP: 15.0000 mL | ORAL | Status: DC | PRN

## 2019-08-30 MED ORDER — MORPHINE SULFATE (PF) 2 MG/ML IV SOLN
2.0000 mg | INTRAVENOUS | Status: DC | PRN
  Administered 2019-08-31: 4 mg via INTRAVENOUS
  Filled 2019-08-30: qty 2

## 2019-08-30 MED ORDER — VITAMIN B-12 100 MCG PO TABS
100.0000 ug | ORAL_TABLET | Freq: Every day | ORAL | Status: DC
  Administered 2019-08-31 – 2019-09-02 (×3): 100 ug via ORAL
  Filled 2019-08-30 (×3): qty 1

## 2019-08-30 MED ORDER — VITAMIN D3 25 MCG (1000 UNIT) PO TABS
1000.0000 [IU] | ORAL_TABLET | Freq: Every day | ORAL | Status: DC
  Administered 2019-08-31 – 2019-09-02 (×3): 1000 [IU] via ORAL
  Filled 2019-08-30 (×6): qty 1

## 2019-08-30 MED ORDER — ACETAMINOPHEN 325 MG RE SUPP: 325.0000 mg | RECTAL | Status: DC | PRN

## 2019-08-30 MED ORDER — PANTOPRAZOLE SODIUM 40 MG PO TBEC
40.0000 mg | DELAYED_RELEASE_TABLET | Freq: Every day | ORAL | Status: DC
  Administered 2019-08-31 – 2019-09-02 (×3): 40 mg via ORAL
  Filled 2019-08-30 (×3): qty 1

## 2019-08-30 MED ORDER — LABETALOL HCL 5 MG/ML IV SOLN
10.0000 mg | INTRAVENOUS | Status: DC | PRN
  Filled 2019-08-30: qty 4

## 2019-08-30 MED ORDER — PHENOL 1.4 % MT LIQD
1.0000 | OROMUCOSAL | Status: DC | PRN
  Filled 2019-08-30: qty 177

## 2019-08-30 MED ORDER — METOPROLOL TARTRATE 5 MG/5ML IV SOLN
2.0000 mg | INTRAVENOUS | Status: DC | PRN
  Filled 2019-08-30: qty 5

## 2019-08-30 MED ORDER — PANTOPRAZOLE SODIUM 40 MG PO TBEC: 40.0000 mg | DELAYED_RELEASE_TABLET | Freq: Every day | ORAL | Status: DC

## 2019-08-30 MED ORDER — RIVASTIGMINE TARTRATE 1.5 MG PO CAPS
6.0000 mg | ORAL_CAPSULE | Freq: Two times a day (BID) | ORAL | Status: DC
  Administered 2019-08-30 – 2019-09-02 (×6): 6 mg via ORAL
  Filled 2019-08-30 (×7): qty 4

## 2019-08-30 MED ORDER — HEPARIN (PORCINE) 25000 UT/250ML-% IV SOLN
1200.0000 [IU]/h | INTRAVENOUS | Status: DC
  Administered 2019-08-30: 1400 [IU]/h via INTRAVENOUS
  Filled 2019-08-30: qty 250

## 2019-08-30 MED ORDER — NITROGLYCERIN 0.4 MG SL SUBL: 0.4000 mg | SUBLINGUAL_TABLET | SUBLINGUAL | Status: DC | PRN

## 2019-08-30 MED ORDER — ONDANSETRON HCL 4 MG/2ML IJ SOLN: 4.0000 mg | Freq: Four times a day (QID) | INTRAMUSCULAR | Status: DC | PRN

## 2019-08-30 MED ORDER — INSULIN ASPART 100 UNIT/ML ~~LOC~~ SOLN: 0.0000 [IU] | Freq: Three times a day (TID) | SUBCUTANEOUS | Status: DC

## 2019-08-30 MED ORDER — ACETAMINOPHEN 325 MG PO TABS: 325.0000 mg | ORAL_TABLET | ORAL | Status: DC | PRN

## 2019-08-30 NOTE — ED Triage Notes (Signed)
Pt arrives via EMS from home with reports of pain and mild numbness to right leg and foot. Pt had 3 stents placed a few months ago here for vascular issues. Pt on eliquis and plavix.

## 2019-08-30 NOTE — ED Notes (Signed)
Lab to add new lab orders to previously drawn sample

## 2019-08-30 NOTE — Progress Notes (Signed)
ANTICOAGULATION CONSULT NOTE - Initial Consult  Pharmacy Consult for Heparin Indication: Ischemic leg   Allergies  Allergen Reactions  . Oxycodone Nausea Only  . Statins Other (See Comments)    Muscle and Bone pain Muscle and Bone pain    Patient Measurements: Height: 5\' 9"  (175.3 cm) Weight: 186 lb (84.4 kg) IBW/kg (Calculated) : 70.7 Heparin Dosing Weight: 84.4 kg  Vital Signs: Temp: 97.8 F (36.6 C) (03/09 1500) Temp Source: Oral (03/09 1500) BP: 166/75 (03/09 1930) Pulse Rate: 59 (03/09 2045)  Labs: Recent Labs    08/30/19 1512 08/30/19 1800  HGB 13.1  --   HCT 41.0  --   PLT 242  --   APTT  --  31  LABPROT  --  15.9*  INR  --  1.3*  CREATININE 1.75*  --     Estimated Creatinine Clearance: 34.2 mL/min (A) (by C-G formula based on SCr of 1.75 mg/dL (H)).   Medical History: Past Medical History:  Diagnosis Date  . Arthritis    "knees, elbows" (03/18/2018)  . Chronic edema    a. Chronic RLE edema.  Marland Kitchen COPD (chronic obstructive pulmonary disease) ([place])   . Coronary artery disease    a. MI s/p balloon 1996, details unclear.  Marland Kitchen Dysrhythmia    PROXIMAL MARGIN FIBRILATION  . GERD (gastroesophageal reflux disease)   . Hyperlipidemia   . Hypertension   . Myocardial infarction ([place]) 1996  . PAD (peripheral artery disease) ([place])    a. s/p stenting 08/2012, 02/2013.   Marland Kitchen PAF (paroxysmal atrial fibrillation) ([place])   . Sleep apnea    "have mask; have to start wearing it" (03/18/2018)  . Stroke [facility]) ~ 2014   denies residual on 03/18/2018  . Thrombosis of lower extremity    a. Listed on patient's medical bracelet - at [place].  Marland Kitchen Type II diabetes mellitus (HCC)     Medications:  Scheduled:  . amiodarone  400 mg Oral BID  . cholecalciferol  1,000 Units Oral Daily  . clopidogrel  75 mg Oral Daily  . ezetimibe  10 mg Oral QPM  . insulin aspart  0-5 Units Subcutaneous QHS  . [START ON 08/31/2019] insulin aspart  0-9 Units Subcutaneous TID WC  . metoprolol tartrate   50 mg Oral BID  . pantoprazole  40 mg Oral Daily  . pantoprazole  40 mg Oral Daily  . potassium chloride  20-40 mEq Oral Once  . ramipril  10 mg Oral BID  . rivastigmine  6 mg Oral BID  . vitamin B-12  100 mcg Oral Daily    Assessment: Patient is a 36 yom that presents to the ED with complaints of leg numbness in his right leg. The patient was had stents placed in his leg previously. US showed that the patient had limited flow past both stents which had noted stenosis. Patient is on Apixaban at home for Afib. Pharmacy has been asked to dose heparin in this patient.   Goal of Therapy:  Heparin level 0.3-0.7 units/ml Monitor platelets by anticoagulation protocol: Yes   Plan:   - No heparin bolus  - Start Heparin Drip @ 1400 units/hr  - Will use aPTT to monitor heparin until aPTT and HL level correlate as patient was on Apixaban PTA  -  Obtain aPTT in ~ 8 hours and Heparin level with AM Labs - Monitor patient for s/s of bleeding and CBC while on heparin   Duanne Limerick PharmD. BCPS  08/30/2019,9:14 PM

## 2019-08-30 NOTE — H&P (Signed)
[facility] [place]  Patient name: Donald Gordon MRN: [id number] DOB: 09-24-1940 Sex: male   REQUESTING PROVIDER:   ER   REASON FOR CONSULT:    Occluded bypass  HISTORY OF PRESENT ILLNESS:   Donald Gordon is a 79 y.o. male, who initially presented to [facility] on 01/09/2018 with an ischemic leg.  Prior to that he had had some form of vascular procedure at the [place].  On this date Dr. Bridgett Larsson performed thrombectomy of the right anterior tibial and peroneal artery as well as a right femoral to below-knee popliteal artery bypass graft with propatent.  He placed a bovine patch on the below-knee popliteal artery.  4 compartment fasciotomies were performed.  On 01/11/2018 he had closure of the fasciotomies.  He then returned on 07/23/2019 with recurrent symptoms.  He underwent angiography on 07/25/2019 by Dr. [name].  His bypass was found to be occluded.  It was realigned with Viabahn stents.  5 mm stents were placed distally and a 6 mm stent proximally.  The patient began having recurrent symptoms several days ago.  He describes being evaluated at the [facility] with angiography via a left femoral approach.  It sounds like his bypass graft was patent at that time.  He is continued to have difficulty walking and numbness.  Patient has a history of coronary artery disease, status post MI.  He is on Eliquis for atrial fibrillation.  He has a history of smoking but quit in the 1990s.  He suffers from hypercholesterolemia but is intolerant to statins.  PAST MEDICAL HISTORY    Past Medical History:  Diagnosis Date  . Arthritis    "knees, elbows" (03/18/2018)  . Chronic edema    a. Chronic RLE edema.  Marland Kitchen COPD (chronic obstructive pulmonary disease) ([place])   . Coronary artery disease    a. MI s/p balloon 1996, details unclear.  Marland Kitchen Dysrhythmia    PROXIMAL MARGIN FIBRILATION  . GERD (gastroesophageal reflux disease)   . Hyperlipidemia   . Hypertension   .  Myocardial infarction ([place]) 1996  . PAD (peripheral artery disease) ([place])    a. s/p stenting 08/2012, 02/2013.   Marland Kitchen PAF (paroxysmal atrial fibrillation) ([place])   . Sleep apnea    "have mask; have to start wearing it" (03/18/2018)  . Stroke [facility]) ~ 2014   denies residual on 03/18/2018  . Thrombosis of lower extremity    a. Listed on patient's medical bracelet - at [place].  Marland Kitchen Type II diabetes mellitus ([place])      FAMILY HISTORY   Family History  Problem Relation Age of Onset  . CAD Mother   . CAD Brother   . Hypertension Other     SOCIAL HISTORY:   Social History   Socioeconomic History  . Marital status: Legally Separated    Spouse name: Not on file  . Number of children: Not on file  . Years of education: Not on file  . Highest education level: Not on file  Occupational History  . Not on file  Tobacco Use  . Smoking status: Former Smoker    Packs/day: 3.00    Years: 30.00    Pack years: 90.00    Quit date: 1996    Years since quitting: 25.2  . Smokeless tobacco: Never Used  Substance and Sexual Activity  . Alcohol use: No    Comment: Remote alcohol use  . Drug use: Never  . Sexual activity: Not Currently  Other Topics Concern  .  Not on file  Social History Narrative  . Not on file   Social Determinants of Health   Financial Resource Strain:   . Difficulty of Paying Living Expenses: Not on file  Food Insecurity:   . Worried About Charity fundraiser in the Last Year: Not on file  . Ran Out of Food in the Last Year: Not on file  Transportation Needs:   . Lack of Transportation (Medical): Not on file  . Lack of Transportation (Non-Medical): Not on file  Physical Activity:   . Days of Exercise per Week: Not on file  . Minutes of Exercise per Session: Not on file  Stress:   . Feeling of Stress : Not on file  Social Connections:   . Frequency of Communication with Friends and Family: Not on file  . Frequency of Social Gatherings with Friends and Family: Not on  file  . Attends Religious Services: Not on file  . Active Member of Clubs or Organizations: Not on file  . Attends Archivist Meetings: Not on file  . Marital Status: Not on file  Intimate Partner Violence:   . Fear of Current or Ex-Partner: Not on file  . Emotionally Abused: Not on file  . Physically Abused: Not on file  . Sexually Abused: Not on file    ALLERGIES:    Allergies  Allergen Reactions  . Oxycodone Nausea Only  . Statins Other (See Comments)    Muscle and Bone pain Muscle and Bone pain    CURRENT MEDICATIONS:    Current Facility-Administered Medications  Medication Dose Route Frequency Provider Last Rate Last Admin  . acetaminophen (TYLENOL) tablet 325-650 mg  325-650 mg Oral Q4H PRN Serafina Mitchell, MD       Or  . acetaminophen (TYLENOL) suppository 325-650 mg  325-650 mg Rectal Q4H PRN Serafina Mitchell, MD      . alum & mag hydroxide-simeth (MAALOX/MYLANTA) 200-200-20 MG/5ML suspension 15-30 mL  15-30 mL Oral Q2H PRN Serafina Mitchell, MD      . amiodarone (PACERONE) tablet 400 mg  400 mg Oral BID Serafina Mitchell, MD      . Derrill Memo ON 08/31/2019] cholecalciferol (VITAMIN D) tablet 1,000 Units  1,000 Units Oral Daily Serafina Mitchell, MD      . clopidogrel (PLAVIX) tablet 75 mg  75 mg Oral Daily Serafina Mitchell, MD      . ezetimibe (ZETIA) tablet 10 mg  10 mg Oral QPM Serafina Mitchell, MD      . guaiFENesin-dextromethorphan (ROBITUSSIN DM) 100-10 MG/5ML syrup 15 mL  15 mL Oral Q4H PRN Serafina Mitchell, MD      . heparin ADULT infusion 100 units/mL (25000 units/261mL sodium chloride 0.45%)  1,400 Units/hr Intravenous Continuous Duanne Limerick, RPH      . hydrALAZINE (APRESOLINE) injection 5 mg  5 mg Intravenous Q20 Min PRN Serafina Mitchell, MD      . insulin aspart (novoLOG) injection 0-5 Units  0-5 Units Subcutaneous QHS Serafina Mitchell, MD      . Derrill Memo ON 08/31/2019] insulin aspart (novoLOG) injection 0-9 Units  0-9 Units Subcutaneous TID WC Serafina Mitchell, MD      . labetalol (NORMODYNE) injection 10 mg  10 mg Intravenous Q10 min PRN Serafina Mitchell, MD      . metoprolol tartrate (LOPRESSOR) injection 2-5 mg  2-5 mg Intravenous Q2H PRN Serafina Mitchell, MD      . metoprolol tartrate (  LOPRESSOR) tablet 50 mg  50 mg Oral BID Serafina Mitchell, MD      . morphine 2 MG/ML injection 2-5 mg  2-5 mg Intravenous Q1H PRN Serafina Mitchell, MD      . nitroGLYCERIN (NITROSTAT) SL tablet 0.4 mg  0.4 mg Sublingual Q5 min PRN Serafina Mitchell, MD      . ondansetron [facility]) injection 4 mg  4 mg Intravenous Q6H PRN Serafina Mitchell, MD      . Derrill Memo ON 08/31/2019] pantoprazole (PROTONIX) EC tablet 40 mg  40 mg Oral Daily Serafina Mitchell, MD      . phenol (CHLORASEPTIC) mouth spray 1 spray  1 spray Mouth/Throat PRN Serafina Mitchell, MD      . potassium chloride SA (KLOR-CON) CR tablet 20-40 mEq  20-40 mEq Oral Once Serafina Mitchell, MD      . ramipril (ALTACE) capsule 10 mg  10 mg Oral BID Serafina Mitchell, MD      . rivastigmine (EXELON) capsule 6 mg  6 mg Oral BID Serafina Mitchell, MD      . Derrill Memo ON 08/31/2019] vitamin B-12 (CYANOCOBALAMIN) tablet 100 mcg  100 mcg Oral Daily Serafina Mitchell, MD       Current Outpatient Medications  Medication Sig Dispense Refill  . acetaminophen (TYLENOL) 500 MG tablet Take 500 mg by mouth 2 (two) times daily as needed (for pain or headaches).     Marland Kitchen amiodarone (PACERONE) 400 MG tablet Take 1 tablet (400 mg total) by mouth 2 (two) times daily. 28 tablet 0  . apixaban (ELIQUIS) 5 MG TABS tablet Take 5 mg by mouth 2 (two) times daily.    . Cholecalciferol (VITAMIN D3 PO) Take 1 tablet by mouth daily.    . clopidogrel (PLAVIX) 75 MG tablet Take 1 tablet (75 mg total) by mouth daily. 90 tablet 1  . Cyanocobalamin (VITAMIN B-12 PO) Take 1 tablet by mouth daily.    Marland Kitchen ezetimibe (ZETIA) 10 MG tablet Take 10 mg by mouth every evening.     . Ferrous Sulfate (IRON PO) Take 1 tablet by mouth daily.    . memantine (NAMENDA) 10  MG tablet Take 10 mg by mouth 2 (two) times daily.    . metoprolol (LOPRESSOR) 50 MG tablet Take 50 mg by mouth 2 (two) times daily.    . pantoprazole (PROTONIX) 40 MG tablet Take 40 mg by mouth daily.    . ramipril (ALTACE) 10 MG capsule Take 10 mg by mouth 2 (two) times daily.    . rivastigmine (EXELON) 6 MG capsule Take 6 mg by mouth 2 (two) times daily.    . nitroGLYCERIN (NITROSTAT) 0.4 MG SL tablet Place 0.4 mg under the tongue every 5 (five) minutes as needed for chest pain.      REVIEW OF SYSTEMS:   [X]  denotes positive finding, [ ]  denotes negative finding Cardiac  Comments:  Chest pain or chest pressure:    Shortness of breath upon exertion:    Short of breath when lying flat:    Irregular heart rhythm:        Vascular    Pain in calf, thigh, or hip brought on by ambulation: x   Pain in feet at night that wakes you up from your sleep:  x   Blood clot in your veins:    Leg swelling:         Pulmonary    Oxygen at home:    Productive  cough:     Wheezing:         Neurologic    Sudden weakness in arms or legs:     Sudden numbness in arms or legs:     Sudden onset of difficulty speaking or slurred speech:    Temporary loss of vision in one eye:     Problems with dizziness:         Gastrointestinal    Blood in stool:      Vomited blood:         Genitourinary    Burning when urinating:     Blood in urine:        Psychiatric    Major depression:         Hematologic    Bleeding problems:    Problems with blood clotting too easily:        Skin    Rashes or ulcers:        Constitutional    Fever or chills:     PHYSICAL EXAM:   Vitals:   08/30/19 1845 08/30/19 1915 08/30/19 1930 08/30/19 2045  BP: (!) 165/56 (!) 155/71 (!) 166/75   Pulse: (!) 56 (!) 56 (!) 57 (!) 59  Resp: 15 12 18 15   Temp:      TempSrc:      SpO2: 95% 95% 96% 95%  Weight:      Height:        GENERAL: The patient is a well-nourished male, in no acute distress. The vital signs are  documented above. CARDIAC: There is a regular rate and rhythm.  VASCULAR: Unable to get a monophasic right dorsalis pedis Doppler signal.  He does have evidence of recent catheterization via the left groin without evidence of pseudoaneurysm or hematoma. PULMONARY: Nonlabored respirations ABDOMEN: Soft and non-tender with normal pitched bowel sounds.  MUSCULOSKELETAL: There are no major deformities or cyanosis. NEUROLOGIC: He has normal movement of his toes and as well as intact sensation SKIN: There are no ulcers or rashes noted. PSYCHIATRIC: The patient has a normal affect.  STUDIES:   Duplex indicates that his bypass graft is occluded  ASSESSMENT and PLAN   Symptomatic right femoral-popliteal bypass graft occlusion: The patient does not have an ischemic leg at this time as evidenced by the fact he has normal motor and sensory function as well as a monophasic anterior tibial Doppler signal.  I discussed because of his level of symptoms that he will need intervention to try to salvage his bypass graft.  I have scheduled him for angiography tomorrow with potential thrombectomy.  He also understands that he may require surgical intervention if we are not able to open his bypass graft percutaneously.  I will discontinue his Eliquis and place him on a heparin drip overnight.  He will be n.p.o. after midnight with plans for intervention in the morning.   Leia Alf, MD, FACS [facility] [phone] Pager [phone]

## 2019-08-30 NOTE — Progress Notes (Addendum)
Right lower extremity arterial duplex complete.  Preliminary results given to Dr. Sharia Reeve over the phone at patient bedside, 19:00.  Please see CV proc tab for preliminary results. Lita Mains- RDMS, RVT 7:19 PM  08/30/2019

## 2019-08-30 NOTE — ED Notes (Signed)
US bedside

## 2019-08-30 NOTE — ED Provider Notes (Signed)
[place] EMERGENCY DEPARTMENT Provider Note   CSN: [id number] Arrival date & time: 08/30/19  1500     History Chief Complaint  Patient presents with  . Leg Pain  . Foot Pain    Donald Gordon is a 79 y.o. male.  Donald Gordon is a 79 y.o. male with a history of CAD, A. Fib, diabetes, hypertension, hyperlipidemia, and peripheral arterial disease with femoral-popliteal bypass graft on the right who presents today with pain in the right leg and foot.  He reports he has had some pain over the past few days but pain became significantly worse this morning and he has been unable to bear weight on the leg.  He reports it is a constant dull ache.  He reports this feels similar to when he has had previous blockages in the graft in his leg.  He reports that as the day progressed he started to experience some numbness and tingling in the foot and the foot started to feel cold.  He denies any swelling in the leg.  No chest pain or shortness of breath.  No fevers or chills.  Patient is on Eliquis and Plavix and states he has not missed any doses of these recently.  He does state that on Friday he had a procedure done at the [place] to check the patency of his graft, they went in through the left thigh and he states that they told him everything looked good.        Past Medical History:  Diagnosis Date  . Arthritis    "knees, elbows" (03/18/2018)  . Chronic edema    a. Chronic RLE edema.  Marland Kitchen COPD (chronic obstructive pulmonary disease) ([place])   . Coronary artery disease    a. MI s/p balloon 1996, details unclear.  Marland Kitchen Dysrhythmia    PROXIMAL MARGIN FIBRILATION  . GERD (gastroesophageal reflux disease)   . Hyperlipidemia   . Hypertension   . Myocardial infarction ([place]) 1996  . PAD (peripheral artery disease) ([place])    a. s/p stenting 08/2012, 02/2013.   Marland Kitchen PAF (paroxysmal atrial fibrillation) ([place])   . Sleep apnea    "have mask; have to start wearing it" (03/18/2018)  . Stroke [facility])  ~ 2014   denies residual on 03/18/2018  . Thrombosis of lower extremity    a. Listed on patient's medical bracelet - at [place].  Marland Kitchen Type II diabetes mellitus [facility])     Patient Active Problem List   Diagnosis Date Noted  . PAD (peripheral artery disease) ([place]) 07/23/2019  . Ischemia of right lower extremity 01/09/2018  . Critical ischemia of foot 01/09/2018  . Atrial fibrillation ([place]) 07/04/2014  . Atrial fibrillation with rapid ventricular response ([place])   . Leukocytosis 07/02/2014  . Paroxysmal atrial fibrillation - with RVR 07/02/2014  . Elevated troponin 07/02/2014  . Renal insufficiency 07/02/2014  . Essential hypertension 07/02/2014  . Hypotension 07/02/2014  . CAD (coronary artery disease) 07/02/2014  . Cough 07/02/2014  . Hyperlipemia 07/02/2014  . Atrial fibrillation with RVR ([place]) 07/02/2014  . SIRS (systemic inflammatory response syndrome) ([place]) 07/02/2014  . Diabetes mellitus type 2, controlled ([place]) 07/02/2014    Past Surgical History:  Procedure Laterality Date  . ABDOMINAL AORTOGRAM W/LOWER EXTREMITY N/A 07/25/2019   Procedure: ABDOMINAL AORTOGRAM W/LOWER EXTREMITY;  Surgeon: Waynetta Sandy, MD;  Location: [place] CV LAB;  Service: Cardiovascular;  Laterality: N/A;  . ANTERIOR LUMBAR FUSION  1981  . APPENDECTOMY    . APPLICATION  OF WOUND VAC Right 01/09/2018   Procedure: APPLICATION OF WOUND VAC ON RIGHT LOWER LEG;  Surgeon: Conrad Middlesex, MD;  Location: [facility];  Service: Vascular;  Laterality: Right;  . BACK SURGERY    . CATARACT EXTRACTION W/ INTRAOCULAR LENS  IMPLANT, BILATERAL Bilateral   . CORONARY ANGIOPLASTY WITH STENT PLACEMENT     "I've got 1 stent in there" (03/18/2018)  . FASCIOTOMY Right 01/09/2018   Procedure: FASCIOTOMY RIGHT LOWER LEG;  Surgeon: Conrad Meeker, MD;  Location: [place];  Service: Vascular;  Laterality: Right;  . FASCIOTOMY CLOSURE Right 01/11/2018   Procedure: FASCIOTOMY CLOSURE RIGHT CALF;  Surgeon: Conrad Hartington, MD;  Location:  [place];  Service: Vascular;  Laterality: Right;  . FEMORAL-POPLITEAL BYPASS GRAFT Right 01/09/2018   Procedure: RIGHT FEMORAL-POPLITEAL ARTERY BYPASS USING PROPATEN 6MM X 80CM VASCULAR GRAFT;  Surgeon: Conrad Bramwell, MD;  Location: [place];  Service: Vascular;  Laterality: Right;  . HEMORRHOID SURGERY    . LEFT HEART CATH AND CORONARY ANGIOGRAPHY N/A 03/19/2018   Procedure: LEFT HEART CATH AND CORONARY ANGIOGRAPHY;  Surgeon: Martinique, Peter M, MD;  Location: [place] CV LAB;  Service: Cardiovascular;  Laterality: N/A;  . PATCH ANGIOPLASTY Right 01/09/2018   Procedure: [place] BIOLOGIC 1CM X 6CM PATCH;  Surgeon: Conrad Elbert, MD;  Location: [place];  Service: Vascular;  Laterality: Right;  . PERIPHERAL VASCULAR INTERVENTION Right 07/25/2019   Procedure: PERIPHERAL VASCULAR INTERVENTION;  Surgeon: Waynetta Sandy, MD;  Location: [place] CV LAB;  Service: Cardiovascular;  Laterality: Right;  SFA X 3  . POSTERIOR LUMBAR FUSION  1978  . THROMBECTOMY FEMORAL ARTERY Right 01/09/2018   Procedure: THROMBECTOMY RIGHT LOWER LEG;  Surgeon: Conrad , MD;  Location: [place];  Service: Vascular;  Laterality: Right;  . TONSILLECTOMY         Family History  Problem Relation Age of Onset  . CAD Mother   . CAD Brother   . Hypertension Other     Social History   Tobacco Use  . Smoking status: Former Smoker    Packs/day: 3.00    Years: 30.00    Pack years: 90.00    Quit date: 1996    Years since quitting: 25.2  . Smokeless tobacco: Never Used  Substance Use Topics  . Alcohol use: No    Comment: Remote alcohol use  . Drug use: Never    Home Medications Prior to Admission medications   Medication Sig Start Date End Date Taking? Authorizing Provider  acetaminophen (TYLENOL) 500 MG tablet Take 500 mg by mouth 2 (two) times daily as needed (for pain or headaches).     [provider]  amiodarone (PACERONE) 400 MG tablet Take 1 tablet (400 mg total) by  mouth 2 (two) times daily. 03/19/18   Almyra Deforest, PA  apixaban (ELIQUIS) 5 MG TABS tablet Take 5 mg by mouth 2 (two) times daily.    [provider]  clopidogrel (PLAVIX) 75 MG tablet Take 1 tablet (75 mg total) by mouth daily. 01/14/18   Dagoberto Ligas, PA-C  ezetimibe (ZETIA) 10 MG tablet Take 5 mg by mouth daily.    [provider]  memantine (NAMENDA) 10 MG tablet Take 10 mg by mouth 2 (two) times daily.    [provider]  metoprolol (LOPRESSOR) 50 MG tablet Take 50 mg by mouth 2 (two) times daily.    [provider]  nitroGLYCERIN (NITROSTAT) 0.4 MG SL tablet  Place 0.4 mg under the tongue every 5 (five) minutes as needed for chest pain.    [provider]  pantoprazole (PROTONIX) 40 MG tablet Take 40 mg by mouth daily.    [provider]  ramipril (ALTACE) 10 MG capsule Take 10 mg by mouth 2 (two) times daily.    [provider]  rivastigmine (EXELON) 6 MG capsule Take 6 mg by mouth 2 (two) times daily.    [provider]    Allergies    Oxycodone and Statins  Review of Systems   Review of Systems  Constitutional: Negative for chills and fever.  Respiratory: Negative for shortness of breath.   Cardiovascular: Negative for chest pain and leg swelling.  Musculoskeletal: Positive for arthralgias. Negative for joint swelling.  Skin: Positive for color change. Negative for rash and wound.  Neurological: Positive for numbness. Negative for weakness.  All other systems reviewed and are negative.   Physical Exam Updated Vital Signs BP (!) 166/64 (BP Location: Right Arm)   Pulse (!) 58   Temp 97.8 F (36.6 C) (Oral)   Resp 16   Ht 5\' 9"  (1.753 m)   Wt 84.4 kg   SpO2 96%   BMI 27.47 kg/m   Physical Exam Vitals and nursing note reviewed.  Constitutional:      General: He is not in acute distress.    Appearance: Normal appearance. He is well-developed and normal weight. He is not diaphoretic.     Comments:  Well-appearing and in no distress  HENT:     Head: Normocephalic and atraumatic.  Eyes:     General:        Right eye: No discharge.        Left eye: No discharge.  Cardiovascular:     Rate and Rhythm: Normal rate and regular rhythm.     Heart sounds: Normal heart sounds.  Pulmonary:     Effort: Pulmonary effort is normal. No respiratory distress.     Breath sounds: Normal breath sounds. No wheezing or rales.     Comments: Respirations equal and unlabored, patient able to speak in full sentences, lungs clear to auscultation bilaterally Abdominal:     General: Bowel sounds are normal. There is no distension.     Palpations: Abdomen is soft. There is no mass.     Tenderness: There is no abdominal tenderness. There is no guarding.  Musculoskeletal:        General: No deformity.     Cervical back: Neck supple.     Comments: Right foot is pale, distal forefoot is cool to the touch, ankle and calf are warm with normal coloration.  Unable to palpate DP or PT pulse, and unable to confirm with Doppler.  Patient is able to wiggle all toes and has normal range of motion at the ankle, knee and hip.  Scars from previous fasciotomies noted.  Patient reports slightly decreased sensation when compared to the left but normal motor function.  Skin:    General: Skin is warm and dry.     Capillary Refill: Capillary refill takes less than 2 seconds.  Neurological:     Mental Status: He is alert.     Coordination: Coordination normal.     Comments: Speech is clear, able to follow commands Moves extremities without ataxia, coordination intact  Psychiatric:        Mood and Affect: Mood normal.        Behavior: Behavior normal.     ED  Results / Procedures / Treatments   Labs (all labs ordered are listed, but only abnormal results are displayed) Labs Reviewed  CBC - Abnormal; Notable for the following components:      Result Value   RBC 4.19 (*)    All other components within normal limits  BASIC  METABOLIC PANEL - Abnormal; Notable for the following components:   Glucose, Bld 158 (*)    Creatinine, Ser 1.75 (*)    GFR calc non Af Amer 36 (*)    GFR calc Af Amer 42 (*)    All other components within normal limits  PROTIME-INR - Abnormal; Notable for the following components:   Prothrombin Time 15.9 (*)    INR 1.3 (*)    All other components within normal limits  COMPREHENSIVE METABOLIC PANEL - Abnormal; Notable for the following components:   Glucose, Bld 104 (*)    Creatinine, Ser 1.47 (*)    Calcium 8.8 (*)    Total Protein 6.4 (*)    GFR calc non Af Amer 45 (*)    GFR calc Af Amer 52 (*)    All other components within normal limits  HEMOGLOBIN A1C - Abnormal; Notable for the following components:   Hgb A1c MFr Bld 5.7 (*)    All other components within normal limits  RESPIRATORY PANEL BY RT PCR (FLU A&B, COVID)  APTT  CBC  URINALYSIS, ROUTINE W REFLEX MICROSCOPIC  BASIC METABOLIC PANEL  APTT  HEPARIN LEVEL (UNFRACTIONATED)  CBG MONITORING, ED    EKG None  Radiology VAS [place] LOWER EXTREMITY ARTERIAL DUPLEX (ONLY [facility] & [facility] 7a-7p)  Result Date: 08/30/2019 LOWER EXTREMITY ARTERIAL DUPLEX STUDY Indications: Cold extremity, recent stent(s?).  Limitations: unclear history, pt seen at [facility] hospital Performing Technologist: [place], RVT  Examination Guidelines: A complete evaluation includes B-mode imaging, spectral Doppler, color Doppler, and power Doppler as needed of all accessible portions of each vessel. Bilateral testing is considered an integral part of a complete examination. Limited examinations for reoccurring indications may be performed as noted.  +----------+--------+-----+--------+--------+------------------+ RIGHT     PSV cm/sRatioStenosisWaveformComments           +----------+--------+-----+--------+--------+------------------+ CIA Distal132                                              +----------+--------+-----+--------+--------+------------------+ CFA [id number]                                             +----------+--------+-----+--------+--------+------------------+ DFA       144                                             +----------+--------+-----+--------+--------+------------------+ POP Prox               occluded                           +----------+--------+-----+--------+--------+------------------+ DP        13                           arterial  vs venous +----------+--------+-----+--------+--------+------------------+  Summary: Right: Stenosis is noted within the proximal thigh stent. Arterial flow noted in the right iliac and CFA. difficult to detect significant flow distally. Two stents noted in proximal right thigh, unable to detect flow in either. Difficult exam due to poor patient history and arterial shadowing.  See table(s) above for measurements and observations.    Preliminary     Procedures .Critical Care Performed by: Jacqlyn Larsen, PA-C Authorized by: Jacqlyn Larsen, PA-C   Critical care provider statement:    Critical care time (minutes):  45   Critical care was necessary to treat or prevent imminent or life-threatening deterioration of the following conditions:  Circulatory failure (Right leg ischemia)   Critical care was time spent personally by me on the following activities:  Discussions with consultants, evaluation of patient's response to treatment, examination of patient, ordering and performing treatments and interventions, ordering and review of laboratory studies, ordering and review of radiographic studies, pulse oximetry, re-evaluation of patient's condition, obtaining history from patient or surrogate and review of old charts   (including critical care time)  Medications Ordered in ED Medications - No data to display  ED Course  I have reviewed the triage vital signs and the nursing notes.  Pertinent labs &  imaging results that were available during my care of the patient were reviewed by me and considered in my medical decision making (see chart for details).    MDM Rules/Calculators/A&P                     79 year old male with a history of peripheral arterial disease with femoral popliteal bypass graft in place, who presents with right leg pain that began this morning and has worsened throughout the day, patient unable to bear weight on the leg and reports some numbness and that his foot has become cold.  He is on Eliquis and Plavix which he has been taking regularly.  States that he recently had a procedure at the [place] to check the patency of his graft which was good.  He is able to move his toes.  Does report this feels similar to when he has had to have thrombectomies done previously.  On exam the right foot is cool to the touch, sensation is somewhat diminished, but patient is able to wiggle the toes and move the foot without difficulty, ankle and calf are warm to the touch without significant discoloration but the distal forefoot is pale.  Unable to palpate any pulses, Dr. Ayesha Rumpf was able to get a faint DP pulse with Doppler.  Will consult vascular surgery.  Case discussed with Dr. Trula Slade with vascular surgery who recommends getting an arterial duplex ultrasound of the right lower extremity, since patient has been compliant with his Eliquis will hold off on heparin at this time.  Will discuss ultrasound results once returned.  Received call from Dr. Trula Slade after ultrasound, graft appears to be occluded.  Initially Dr. Trula Slade plan to take patient to the OR tonight, rapid Covid test ordered.  Dr. Trula Slade will admit with plans for angiography with potential thrombectomy, may need surgical intervention if they are not able to open the graft.  Dr. Trula Slade will start heparin overnight with plans for procedure tomorrow.   Final Clinical Impression(s) / ED Diagnoses Final diagnoses:  Peripheral arterial  disease ([place])  Occlusion of right femoropopliteal bypass graft, initial encounter ([place])    Rx / DC Orders ED Discharge Orders    None  Jacqlyn Larsen, PA-C 08/30/19 2347    Quintella Reichert, MD 09/02/19 [phone]

## 2019-08-31 ENCOUNTER — Encounter (HOSPITAL_COMMUNITY)
Admission: EM | Disposition: A | Discharge status: Home / Self Care | Payer: Self-pay | Source: Home / Self Care | Attending: Surgery

## 2019-08-31 DIAGNOSIS — I998 Other disorder of circulatory system: Secondary | ICD-10-CM | POA: Diagnosis present

## 2019-08-31 DIAGNOSIS — T82868A Thrombosis of vascular prosthetic devices, implants and grafts, initial encounter: Secondary | ICD-10-CM

## 2019-08-31 DIAGNOSIS — I70229 Atherosclerosis of native arteries of extremities with rest pain, unspecified extremity: Secondary | ICD-10-CM | POA: Diagnosis present

## 2019-08-31 HISTORY — PX: LOWER EXTREMITY ANGIOGRAPHY: CATH118251

## 2019-08-31 LAB — CBC
HCT: 38.2 % — ABNORMAL LOW (ref 39.0–52.0)
HCT: 36.6 % — ABNORMAL LOW (ref 39.0–52.0)
Hemoglobin: 12.2 g/dL — ABNORMAL LOW (ref 13.0–17.0)
Hemoglobin: 11.7 g/dL — ABNORMAL LOW (ref 13.0–17.0)
MCH: 31.5 pg (ref 26.0–34.0)
MCH: 31.5 pg (ref 26.0–34.0)
MCHC: 31.9 g/dL (ref 30.0–36.0)
MCHC: 32 g/dL (ref 30.0–36.0)
MCV: 98.7 fL (ref 80.0–100.0)
MCV: 98.7 fL (ref 80.0–100.0)
Platelets: 229 10*3/uL (ref 150–400)
Platelets: 210 10*3/uL (ref 150–400)
RBC: 3.87 MIL/uL — ABNORMAL LOW (ref 4.22–5.81)
RBC: 3.71 MIL/uL — ABNORMAL LOW (ref 4.22–5.81)
RDW: 13.1 % (ref 11.5–15.5)
RDW: 13.2 % (ref 11.5–15.5)
WBC: 6.9 10*3/uL (ref 4.0–10.5)
WBC: 5.8 10*3/uL (ref 4.0–10.5)
nRBC: 0 % (ref 0.0–0.2)
nRBC: 0 % (ref 0.0–0.2)

## 2019-08-31 LAB — HEPARIN LEVEL (UNFRACTIONATED)
Heparin Unfractionated: >2.2 IU/mL — ABNORMAL HIGH (ref 0.30–0.70)
Heparin Unfractionated: 1.16 IU/mL — ABNORMAL HIGH (ref 0.30–0.70)

## 2019-08-31 LAB — APTT
aPTT: 111 seconds — ABNORMAL HIGH (ref 24–36)
aPTT: 107 seconds — ABNORMAL HIGH (ref 24–36)
aPTT: 61 seconds — ABNORMAL HIGH (ref 24–36)

## 2019-08-31 LAB — CBG MONITORING, ED
Glucose-Capillary: 108 mg/dL — ABNORMAL HIGH (ref 70–99)
Glucose-Capillary: 120 mg/dL — ABNORMAL HIGH (ref 70–99)

## 2019-08-31 LAB — GLUCOSE, CAPILLARY
Glucose-Capillary: 94 mg/dL (ref 70–99)
Glucose-Capillary: 83 mg/dL (ref 70–99)
Glucose-Capillary: 138 mg/dL — ABNORMAL HIGH (ref 70–99)

## 2019-08-31 LAB — BASIC METABOLIC PANEL
Anion gap: 12 (ref 5–15)
BUN: 23 mg/dL (ref 8–23)
CO2: 23 mmol/L (ref 22–32)
Calcium: 9 mg/dL (ref 8.9–10.3)
Chloride: 107 mmol/L (ref 98–111)
Creatinine, Ser: 1.45 mg/dL — ABNORMAL HIGH (ref 0.61–1.24)
GFR calc Af Amer: 53 mL/min — ABNORMAL LOW (ref >60)
GFR calc non Af Amer: 45 mL/min — ABNORMAL LOW (ref >60)
Glucose, Bld: 122 mg/dL — ABNORMAL HIGH (ref 70–99)
Potassium: 3.9 mmol/L (ref 3.5–5.1)
Sodium: 142 mmol/L (ref 135–145)

## 2019-08-31 LAB — MRSA PCR SCREENING: MRSA by PCR: NEGATIVE

## 2019-08-31 LAB — FIBRINOGEN: Fibrinogen: 264 mg/dL (ref 210–475)

## 2019-08-31 SURGERY — LOWER EXTREMITY ANGIOGRAPHY
Anesthesia: LOCAL

## 2019-08-31 MED ORDER — IODIXANOL 320 MG/ML IV SOLN
INTRAVENOUS | Status: DC | PRN
  Administered 2019-08-31: 100 mL via INTRA_ARTERIAL

## 2019-08-31 MED ORDER — LIDOCAINE HCL (PF) 1 % IJ SOLN
INTRAMUSCULAR | Status: DC | PRN
  Administered 2019-08-31: 15 mL via INTRADERMAL

## 2019-08-31 MED ORDER — HEPARIN (PORCINE) 25000 UT/250ML-% IV SOLN
850.0000 [IU]/h | INTRAVENOUS | Status: DC
  Filled 2019-08-31: qty 250

## 2019-08-31 MED ORDER — HYDRALAZINE HCL 20 MG/ML IJ SOLN: 5.0000 mg | INTRAMUSCULAR | Status: DC | PRN

## 2019-08-31 MED ORDER — SODIUM CHLORIDE 0.9 % IV SOLN
0.5000 mg/h | INTRAVENOUS | Status: DC
  Administered 2019-08-31: 0.5 mg/h
  Filled 2019-08-31 (×3): qty 10

## 2019-08-31 MED ORDER — LABETALOL HCL 5 MG/ML IV SOLN: 10.0000 mg | INTRAVENOUS | Status: DC | PRN

## 2019-08-31 MED ORDER — MIDAZOLAM HCL 2 MG/2ML IJ SOLN
INTRAMUSCULAR | Status: AC
  Filled 2019-08-31: qty 2

## 2019-08-31 MED ORDER — MIDAZOLAM HCL 2 MG/2ML IJ SOLN: 1.0000 mg | INTRAMUSCULAR | Status: DC | PRN

## 2019-08-31 MED ORDER — MORPHINE SULFATE (PF) 4 MG/ML IV SOLN: 5.0000 mg | INTRAVENOUS | Status: DC | PRN

## 2019-08-31 MED ORDER — LIDOCAINE HCL (PF) 1 % IJ SOLN
INTRAMUSCULAR | Status: AC
  Filled 2019-08-31: qty 30

## 2019-08-31 MED ORDER — SODIUM CHLORIDE 0.9% FLUSH
3.0000 mL | Freq: Two times a day (BID) | INTRAVENOUS | Status: DC
  Administered 2019-08-31 – 2019-09-01 (×2): 3 mL via INTRAVENOUS

## 2019-08-31 MED ORDER — ONDANSETRON HCL 4 MG/2ML IJ SOLN: 4.0000 mg | Freq: Four times a day (QID) | INTRAMUSCULAR | Status: DC | PRN

## 2019-08-31 MED ORDER — SODIUM CHLORIDE 0.9 % IV SOLN: INTRAVENOUS | Status: DC

## 2019-08-31 MED ORDER — MIDAZOLAM HCL 2 MG/2ML IJ SOLN
INTRAMUSCULAR | Status: DC | PRN
  Administered 2019-08-31: 1 mg via INTRAVENOUS

## 2019-08-31 MED ORDER — HEPARIN (PORCINE) IN NACL 1000-0.9 UT/500ML-% IV SOLN
INTRAVENOUS | Status: AC
  Filled 2019-08-31: qty 1000

## 2019-08-31 MED ORDER — FENTANYL CITRATE (PF) 100 MCG/2ML IJ SOLN
INTRAMUSCULAR | Status: AC
  Filled 2019-08-31: qty 2

## 2019-08-31 MED ORDER — HEPARIN (PORCINE) IN NACL 1000-0.9 UT/500ML-% IV SOLN
INTRAVENOUS | Status: DC | PRN
  Administered 2019-08-31 (×2): 500 mL

## 2019-08-31 MED ORDER — SODIUM CHLORIDE 0.9 % IV SOLN: 250.0000 mL | INTRAVENOUS | Status: DC | PRN

## 2019-08-31 MED ORDER — FENTANYL CITRATE (PF) 100 MCG/2ML IJ SOLN
INTRAMUSCULAR | Status: DC | PRN
  Administered 2019-08-31: 50 ug via INTRAVENOUS

## 2019-08-31 MED ORDER — SODIUM CHLORIDE 0.9% FLUSH: 3.0000 mL | INTRAVENOUS | Status: DC | PRN

## 2019-08-31 MED ORDER — SODIUM CHLORIDE 0.9 % IV SOLN
0.5000 mg/h | INTRAVENOUS | Status: DC
  Filled 2019-08-31 (×2): qty 10

## 2019-08-31 MED ORDER — CHLORHEXIDINE GLUCONATE CLOTH 2 % EX PADS
6.0000 | MEDICATED_PAD | Freq: Every day | CUTANEOUS | Status: DC
  Administered 2019-08-31 – 2019-09-01 (×2): 6 via TOPICAL

## 2019-08-31 SURGICAL SUPPLY — 16 items
CATH BEACON 5 .035 65 KMP TIP (CATHETERS) ×1 IMPLANT
CATH INFUS 135CMX50CM (CATHETERS) ×1 IMPLANT
CATH OMNI FLUSH 5F 65CM (CATHETERS) ×1 IMPLANT
GLIDEWIRE ADV .035X260CM (WIRE) ×1 IMPLANT
GUIDEWIRE ANGLED .035X150CM (WIRE) ×1 IMPLANT
KIT MICROPUNCTURE NIT STIFF (SHEATH) ×1 IMPLANT
KIT PV (KITS) ×2 IMPLANT
SHEATH FLEX ANSEL ST 6FR 45CM (SHEATH) ×1 IMPLANT
SHEATH PINNACLE 5F 10CM (SHEATH) ×1 IMPLANT
SHEATH PINNACLE 6F 10CM (SHEATH) ×1 IMPLANT
SHEATH PROBE COVER 6X72 (BAG) ×1 IMPLANT
SYR MEDRAD MARK V 150ML (SYRINGE) ×1 IMPLANT
TRANSDUCER W/STOPCOCK (MISCELLANEOUS) ×2 IMPLANT
TRAY PV CATH (CUSTOM PROCEDURE TRAY) ×2 IMPLANT
WIRE BENTSON .035X145CM (WIRE) ×1 IMPLANT
WIRE TORQFLEX AUST .018X40CM (WIRE) ×4 IMPLANT

## 2019-08-31 NOTE — Progress Notes (Signed)
ANTICOAGULATION CONSULT NOTE - Follow Up Consult  Pharmacy Consult for heparin Indication: occluded fem-pop bypass graft  Labs: Recent Labs    08/30/19 1512 08/30/19 1800 08/30/19 2102 08/31/19 0230 08/31/19 1327  HGB 13.1  --   --  12.2*  --   HCT 41.0  --   --  38.2*  --   PLT 242  --   --  229  --   APTT  --  31  --  111* 107*  LABPROT  --  15.9*  --   --   --   INR  --  1.3*  --   --   --   HEPARINUNFRC  --   --   --  >2.20*  --   CREATININE 1.75*  --  1.47* 1.45*  --     Assessment: 79yo male with occluded fem-pop -s/p thrombolytics -  tpa 0.5mg /hr  running through sheath and heparin drip 800 uts/hr running peripheral IV.   Apixaban PTA will use aPTT for heparin dosing until HL and aPTT correlate  No signs of bleeding noted.  Goal of Therapy:  HL 0.2-0.5 APTT 60-85 sec    Plan:  Continue heparin 800 uts/hr Alteplase 0.5mg /hr  Follow up aptt/HL/fibrinpgen q6h  Bonnita Nasuti Pharm.D. CPP, BCPS Clinical Pharmacist [phone] 08/31/2019 6:41 PM

## 2019-08-31 NOTE — Progress Notes (Signed)
ANTICOAGULATION CONSULT NOTE - Follow Up Consult  Pharmacy Consult for heparin Indication: occluded fem-pop bypass graft  Labs: Recent Labs    08/30/19 1512 08/30/19 1800 08/30/19 2102 08/31/19 0230  HGB 13.1  --   --  12.2*  HCT 41.0  --   --  38.2*  PLT 242  --   --  229  APTT  --  31  --  111*  LABPROT  --  15.9*  --   --   INR  --  1.3*  --   --   HEPARINUNFRC  --   --   --  >2.20*  CREATININE 1.75*  --  1.47* 1.45*    Assessment: 78yo male supratherapeutic on heparin with initial dosing while Eliquis on hold; no gtt issues or signs of bleeding per RN.  Goal of Therapy:  aPTT 66-102 seconds   Plan:  Will decrease heparin gtt by 2-3 units/kg/hr to 1200 units/hr and check PTT in 8 hours.    Wynona Neat, PharmD, BCPS  08/31/2019,4:04 AM

## 2019-08-31 NOTE — ED Notes (Signed)
Patient declining need for more pain medication at this time. Patient advised to make staff aware when he feels like he needs more pain medication.

## 2019-08-31 NOTE — Op Note (Signed)
Patient name: Donald Gordon MRN: [id number] DOB: 23-Jan-1941 Sex: male  08/31/2019 Pre-operative Diagnosis: Critical ischemia right lower extremity with occluded right SFA popliteal and TP trunk Viabahn stents Post-operative diagnosis:  Same Surgeon:  [name], MD Procedure Performed: 1.  Ultrasound-guided access of the left common femoral artery 2.  Aortogram 3.  Right lower extremity arteriogram with selection of third-order branches 4.  Placement of thrombolytic's catheter in the right SFA popliteal and tibioperoneal trunk Viabahn stents with initiation of catheter-directed thrombolysis 5.  53 minutes of monitored moderate conscious sedation time  Indications: Patient is a 79 year old male who previously underwent a right femoral to above-knee popliteal bypass with vein from the same leg.  He presented in 2019 when this occluded.  He had thrombectomy and redo bypass from the common femoral to below-knee popliteal bypass with graft and 4 compartment fasciotomies in 2019 by Dr. Bridgett Larsson.  He recently presented with occlusion of his bypass on 07/25/2019 and Dr. [name] relined the bypass from the tibioperoneal trunk popliteal and SFA with Viabahn stents.  He presented again last night with right foot pain and found to have occluded bypass again.  Findings:   Aortogram showed patent iliac stents bilaterally with no flow-limiting stenosis.  The only runoff in the right groin was the profunda.  The right femoral to below-knee popliteal bypass this has been realigned with Viabahn stents into the TP trunk distally is occluded.  Only distal runoff he has is a peroneal that occludes in the mid calf and the posterior tibial it appears to have thrombus throughout multiple segments with no evidence of flow into the foot.  Ultimately the bypass that has been relined with Viabahn's is now occluded and was selected with a KMP catheter and a Glidewire advantage and ultimately placed long UniFuse catheter  down the Viabhan stents into the TP trunk.  He will go to the ICU for thrombolysis overnight.   Procedure:  The patient was identified in the holding area and taken to room 8.  The patient was then placed supine on the table and prepped and draped in the usual sterile fashion.  A time out was called.  Ultrasound was used to evaluate the left common femoral artery.  It was patent .  A digital ultrasound image was acquired.  A micropuncture needle was used to access the left common femoral artery under ultrasound guidance.  An 018 wire was advanced without resistance and a micropuncture sheath was placed.  His left iliac is fairly diseased and access was difficult.  Ultimately could not get a Bentson wire to advance and used a Glidewire to get into the infrarenal aorta and then ultimately used a 6 [place] dilator then placed a short 5 French sheath in the left groin.  Omni Flush catheter was then passed up and aortogram was obtained pertinent findings noted above.  I then used a Glidewire advantage and ultimately selected the right iliac and passed the Omni over the bifurcation.  Right lower extremity arteriogram was then obtained with pertinent findings noted above but his right femoral to below-knee popliteal bypass that has been relined with Viabahn stents is now occluded.  With the assistance of a KMP catheter was able to cannulate the proximal Viabahn stent in the SFA segment.  Ultimately able to get a wire all the way down to the tibioperoneal trunk and then we placed a long UniFuse catheter.  He will be started on alteplase at 0.5 mg an hour through the  UniFuse catheter and then heparin 800 mg an hour through the sheath.  Sheath was secured in place to be taken to ICU.  Plan: Will plan for thrombolytics catheter check of the right lower extremity tomorrow.  Will run heparin at 800 units/hr through the sheath and TPA at 0.5 mg/hr.     [name], MD [facility]  [place] Office: [phone]

## 2019-08-31 NOTE — Progress Notes (Signed)
[facility]  Subjective  - has remained in ED overnight.   Objective (!) 156/66 (!) 56 97.8 F (36.6 C) (Oral) 17 94% No intake or output data in the 24 hours ending 08/31/19 1419    Laboratory Lab Results: Recent Labs    08/30/19 1512 08/31/19 0230  WBC 9.0 6.9  HGB 13.1 12.2*  HCT 41.0 38.2*  PLT 242 229   BMET Recent Labs    08/30/19 2102 08/31/19 0230  NA 141 142  K 4.5 3.9  CL 106 107  CO2 23 23  GLUCOSE 104* 122*  BUN 22 23  CREATININE 1.47* 1.45*  CALCIUM 8.8* 9.0    COAG Lab Results  Component Value Date   INR 1.3 (H) 08/30/2019   INR 1.2 07/23/2019   INR 1.28 01/09/2018   No results found for: PTT  Assessment/Planning:  Plan for right lower extremity arteriogram and possible thrombolysis.  Occluded bypass.    [name] 08/31/2019 2:19 PM --

## 2019-09-01 ENCOUNTER — Inpatient Hospital Stay (HOSPITAL_COMMUNITY)
Admission: EM | Disposition: A | Discharge status: Home / Self Care | Payer: Self-pay | Source: Home / Self Care | Attending: Surgery

## 2019-09-01 ENCOUNTER — Ambulatory Visit (HOSPITAL_COMMUNITY): Admission: RE | Admit: 2019-09-01 | Payer: Medicare Other | Source: Home / Self Care | Admitting: Vascular Surgery

## 2019-09-01 HISTORY — PX: PERIPHERAL VASCULAR INTERVENTION: CATH118257

## 2019-09-01 HISTORY — PX: PERIPHERAL VASCULAR BALLOON ANGIOPLASTY: CATH118281

## 2019-09-01 HISTORY — PX: PERIPHERAL VASCULAR THROMBECTOMY: CATH118306

## 2019-09-01 LAB — BASIC METABOLIC PANEL
Anion gap: 7 (ref 5–15)
Anion gap: 9 (ref 5–15)
BUN: 16 mg/dL (ref 8–23)
BUN: 14 mg/dL (ref 8–23)
CO2: 25 mmol/L (ref 22–32)
CO2: 21 mmol/L — ABNORMAL LOW (ref 22–32)
Calcium: 8.4 mg/dL — ABNORMAL LOW (ref 8.9–10.3)
Calcium: 8 mg/dL — ABNORMAL LOW (ref 8.9–10.3)
Chloride: 108 mmol/L (ref 98–111)
Chloride: 108 mmol/L (ref 98–111)
Creatinine, Ser: 1.33 mg/dL — ABNORMAL HIGH (ref 0.61–1.24)
Creatinine, Ser: 1.37 mg/dL — ABNORMAL HIGH (ref 0.61–1.24)
GFR calc Af Amer: 59 mL/min — ABNORMAL LOW (ref >60)
GFR calc Af Amer: 56 mL/min — ABNORMAL LOW (ref >60)
GFR calc non Af Amer: 50 mL/min — ABNORMAL LOW (ref >60)
GFR calc non Af Amer: 49 mL/min — ABNORMAL LOW (ref >60)
Glucose, Bld: 108 mg/dL — ABNORMAL HIGH (ref 70–99)
Glucose, Bld: 110 mg/dL — ABNORMAL HIGH (ref 70–99)
Potassium: 4.8 mmol/L (ref 3.5–5.1)
Potassium: 4.3 mmol/L (ref 3.5–5.1)
Sodium: 140 mmol/L (ref 135–145)
Sodium: 138 mmol/L (ref 135–145)

## 2019-09-01 LAB — CBC
HCT: 36.4 % — ABNORMAL LOW (ref 39.0–52.0)
HCT: 33.6 % — ABNORMAL LOW (ref 39.0–52.0)
Hemoglobin: 11.7 g/dL — ABNORMAL LOW (ref 13.0–17.0)
Hemoglobin: 10.9 g/dL — ABNORMAL LOW (ref 13.0–17.0)
MCH: 31.8 pg (ref 26.0–34.0)
MCH: 32 pg (ref 26.0–34.0)
MCHC: 32.1 g/dL (ref 30.0–36.0)
MCHC: 32.4 g/dL (ref 30.0–36.0)
MCV: 98.9 fL (ref 80.0–100.0)
MCV: 98.5 fL (ref 80.0–100.0)
Platelets: 191 10*3/uL (ref 150–400)
Platelets: 178 10*3/uL (ref 150–400)
RBC: 3.68 MIL/uL — ABNORMAL LOW (ref 4.22–5.81)
RBC: 3.41 MIL/uL — ABNORMAL LOW (ref 4.22–5.81)
RDW: 13.2 % (ref 11.5–15.5)
RDW: 13.3 % (ref 11.5–15.5)
WBC: 5.5 10*3/uL (ref 4.0–10.5)
WBC: 5.4 10*3/uL (ref 4.0–10.5)
nRBC: 0 % (ref 0.0–0.2)
nRBC: 0 % (ref 0.0–0.2)

## 2019-09-01 LAB — POCT ACTIVATED CLOTTING TIME
Activated Clotting Time: 235 seconds
Activated Clotting Time: 224 seconds
Activated Clotting Time: 191 seconds
Activated Clotting Time: 175 seconds

## 2019-09-01 LAB — FIBRINOGEN: Fibrinogen: 296 mg/dL (ref 210–475)

## 2019-09-01 LAB — GLUCOSE, CAPILLARY
Glucose-Capillary: 96 mg/dL (ref 70–99)
Glucose-Capillary: 104 mg/dL — ABNORMAL HIGH (ref 70–99)
Glucose-Capillary: 86 mg/dL (ref 70–99)

## 2019-09-01 LAB — HEPARIN LEVEL (UNFRACTIONATED): Heparin Unfractionated: 1.09 IU/mL — ABNORMAL HIGH (ref 0.30–0.70)

## 2019-09-01 LAB — APTT: aPTT: 59 seconds — ABNORMAL HIGH (ref 24–36)

## 2019-09-01 SURGERY — PERIPHERAL VASCULAR THROMBECTOMY
Anesthesia: LOCAL | Laterality: Right

## 2019-09-01 MED ORDER — SODIUM CHLORIDE 0.9 % IV SOLN: INTRAVENOUS | Status: AC

## 2019-09-01 MED ORDER — SODIUM CHLORIDE 0.9% FLUSH
3.0000 mL | Freq: Two times a day (BID) | INTRAVENOUS | Status: DC
  Administered 2019-09-02: 3 mL via INTRAVENOUS

## 2019-09-01 MED ORDER — LIDOCAINE HCL (PF) 1 % IJ SOLN
INTRAMUSCULAR | Status: AC
  Filled 2019-09-01: qty 30

## 2019-09-01 MED ORDER — HEPARIN SODIUM (PORCINE) 1000 UNIT/ML IJ SOLN
INTRAMUSCULAR | Status: DC | PRN
  Administered 2019-09-01: 7000 [IU] via INTRAVENOUS
  Administered 2019-09-01: 3000 [IU] via INTRAVENOUS

## 2019-09-01 MED ORDER — SODIUM CHLORIDE 0.9 % IV BOLUS
500.0000 mL | Freq: Once | INTRAVENOUS | Status: AC
  Administered 2019-09-01: 500 mL via INTRAVENOUS

## 2019-09-01 MED ORDER — IODIXANOL 320 MG/ML IV SOLN
INTRAVENOUS | Status: DC | PRN
  Administered 2019-09-01: 150 mL via INTRA_ARTERIAL

## 2019-09-01 MED ORDER — SODIUM CHLORIDE 0.9% FLUSH: 3.0000 mL | INTRAVENOUS | Status: DC | PRN

## 2019-09-01 MED ORDER — HEPARIN (PORCINE) IN NACL 1000-0.9 UT/500ML-% IV SOLN
INTRAVENOUS | Status: DC | PRN
  Administered 2019-09-01 (×2): 500 mL

## 2019-09-01 MED ORDER — LIDOCAINE HCL (PF) 1 % IJ SOLN
INTRAMUSCULAR | Status: DC | PRN
  Administered 2019-09-01: 15 mL via INTRADERMAL

## 2019-09-01 MED ORDER — ACETAMINOPHEN 325 MG PO TABS: 650.0000 mg | ORAL_TABLET | ORAL | Status: DC | PRN

## 2019-09-01 MED ORDER — SODIUM CHLORIDE 0.9 % IV SOLN: 250.0000 mL | INTRAVENOUS | Status: DC | PRN

## 2019-09-01 MED ORDER — HYDRALAZINE HCL 20 MG/ML IJ SOLN
5.0000 mg | INTRAMUSCULAR | Status: DC | PRN
  Administered 2019-09-01: 5 mg via INTRAVENOUS

## 2019-09-01 MED ORDER — LABETALOL HCL 5 MG/ML IV SOLN: 10.0000 mg | INTRAVENOUS | Status: DC | PRN

## 2019-09-01 MED ORDER — MORPHINE SULFATE (PF) 2 MG/ML IV SOLN
2.0000 mg | INTRAVENOUS | Status: DC | PRN
  Administered 2019-09-01 (×4): 2 mg via INTRAVENOUS
  Filled 2019-09-01 (×3): qty 1

## 2019-09-01 MED ORDER — HEPARIN (PORCINE) IN NACL 1000-0.9 UT/500ML-% IV SOLN
INTRAVENOUS | Status: AC
  Filled 2019-09-01: qty 1000

## 2019-09-01 MED ORDER — HYDRALAZINE HCL 20 MG/ML IJ SOLN
INTRAMUSCULAR | Status: AC
  Filled 2019-09-01: qty 1

## 2019-09-01 MED ORDER — ONDANSETRON HCL 4 MG/2ML IJ SOLN: 4.0000 mg | Freq: Four times a day (QID) | INTRAMUSCULAR | Status: DC | PRN

## 2019-09-01 MED ORDER — OXYCODONE HCL 5 MG PO TABS: 5.0000 mg | ORAL_TABLET | ORAL | Status: DC | PRN

## 2019-09-01 MED ORDER — NITROGLYCERIN 1 MG/10 ML FOR IR/CATH LAB
INTRA_ARTERIAL | Status: AC
  Filled 2019-09-01: qty 10

## 2019-09-01 MED ORDER — HEPARIN SODIUM (PORCINE) 1000 UNIT/ML IJ SOLN
INTRAMUSCULAR | Status: AC
  Filled 2019-09-01: qty 1

## 2019-09-01 SURGICAL SUPPLY — 24 items
BAG SNAP BAND KOVER 36X36 (MISCELLANEOUS) ×2 IMPLANT
BALL STERLING OTW 2.5X100X150 (BALLOONS) ×4
BALLN MUSTANG 5X20X135 (BALLOONS) ×2
BALLN MUSTANG 5X60X135 (BALLOONS) ×4
BALLN STERLING OTW 2.5X100X150 (BALLOONS) ×2
BALLOON MUSTANG 5X20X135 (BALLOONS) ×1 IMPLANT
BALLOON MUSTANG 5X60X135 (BALLOONS) ×2 IMPLANT
BALLOON STRLNG OTW 2.5X100X150 (BALLOONS) ×2 IMPLANT
CANISTER PENUMBRA ENGINE (MISCELLANEOUS) ×2 IMPLANT
CATH INDIGO CAT6 KIT (CATHETERS) ×2 IMPLANT
CATH QUICKCROSS .035X135CM (MICROCATHETER) ×4 IMPLANT
COVER DOME SNAP 22 D (MISCELLANEOUS) ×2 IMPLANT
DEVICE CONTINUOUS FLUSH (MISCELLANEOUS) ×2 IMPLANT
KIT ENCORE 26 ADVANTAGE (KITS) ×2 IMPLANT
KIT PV (KITS) ×2 IMPLANT
SHEATH PINNACLE ST 6F 45CM (SHEATH) ×2 IMPLANT
STENT ELUVIA 6X60X130 (Permanent Stent) ×2 IMPLANT
STENT TIGRIS 5X60X120 (Permanent Stent) ×2 IMPLANT
SYR MEDRAD MARK V 150ML (SYRINGE) ×2 IMPLANT
TRAY PV CATH (CUSTOM PROCEDURE TRAY) ×2 IMPLANT
TUBING CIL FLEX 10 FLL-RA (TUBING) ×2 IMPLANT
WIRE G V18X300CM (WIRE) ×4 IMPLANT
WIRE ROSEN-J .035X260CM (WIRE) ×2 IMPLANT
WIRE SPARTACORE .014X300CM (WIRE) ×2 IMPLANT

## 2019-09-01 NOTE — Op Note (Signed)
    Patient name: Donald Gordon MRN: [id number] DOB: 02/09/1941 Sex: male  09/01/2019 Pre-operative Diagnosis: Occluded bypass graft Post-operative diagnosis:  Same Surgeon:  Erlene Quan C. [name], MD Procedure Performed: 1.  Lysis recheck with right lower extremity angiogram 2.  Penumbra mechanical thrombectomy right tibioperoneal trunk, peroneal artery and posterior tibial artery 3.  2-1/2 mm balloon angioplasty right peroneal artery 4.  Stent of TP trunk with 5 x 60 mm Tigris 5.  Stent of proximal SFA in the common femoral artery with 6 x 26mm Elluvia   Indications: 79 year old male with previous right femoropopliteal bypass that has occluded.  Also has previous history of fasciotomies.  Subsequently has bypass occluded.  This was realigned with covered stents.  This was now all occluded he is undergone lysis the day prior to this.  Findings: The stent had lysed open.  There was an issue with the proximal stent into the common femoral artery this was opened with 6 mm Liviu.  There appeared to be a distal stent issue this was opened with 5 mm Tigris.  Peroneal and posterior tibial arteries were occluded these were opened with mechanical thrombectomy.  I also ballooned the posterior tibial artery but it did have spasm.  At completion there was brisk flow through the posterior tibial artery to the foot.  There was no further stenosis throughout the endovascular bypass.   Procedure:  The patient was identified in the holding area and taken to room 8.  The patient was then placed supine on the table and prepped and draped in the usual sterile fashion.  A time out was called.  We wired the lytic catheter with a 1 4 wire.  Patient was fully heparinized.  We performed right lower extremity angiogram with the above findings.  We then exchanged for a 6 [place] destination sheath.  We performed a chemical thrombectomy with peroneal and posterior tibial arteries independently.  We then performed completion  angiography.  We elected to stent the distal stent and this was done with a tigris stent postdilated later with 5 mm balloon.  Proximally we extended with a  drug-eluting stent this was postdilated with 5 mm balloon.  We had some persistent occlusion of the peroneal artery distally we did give nitroglycerin on 2 separate occasions did balloon angioplasty this but still had minimal flow distally.  We elected to leave posterior tibial is the only runoff.  We remove the wire and the sheath was retracted past the external leg artery on the left.  He tolerated procedure without immediate complication.  Contrast: 150cc   Delrico Minehart C. [name], MD [facility] [place] Office: [phone] Pager: [phone]

## 2019-09-01 NOTE — Progress Notes (Signed)
[facility] [place]  Subjective  - States his right foot feels better.  Motor and sensory intact.   Objective (!) 143/64 (!) 57 98 F (36.7 C) (Oral) 17 97%  Intake/Output Summary (Last 24 hours) at 09/01/2019 0747 Last data filed at 09/01/2019 0700 Gross per 24 hour  Intake 1387.38 ml  Output 300 ml  Net 1087.38 ml    Left groin sheath no significant hematoma Right foot warm, motor sensory intact, no signals in right foot  Laboratory Lab Results: Recent Labs    08/31/19 2206 09/01/19 0504  WBC 5.8 5.5  HGB 11.7* 11.7*  HCT 36.6* 36.4*  PLT 210 191   BMET Recent Labs    08/30/19 2102 08/31/19 0230  NA 141 142  K 4.5 3.9  CL 106 107  CO2 23 23  GLUCOSE 104* 122*  BUN 22 23  CREATININE 1.47* 1.45*  CALCIUM 8.8* 9.0    COAG Lab Results  Component Value Date   INR 1.3 (H) 08/30/2019   INR 1.2 07/23/2019   INR 1.28 01/09/2018   No results found for: PTT  Assessment/Planning:  Thrombolysis right lower extremity bypass overnight which had been relined with Viabahn stents into TP trunk and is now occluded.  Hgb 11.7 stable.  Fibrinogen 296.  Groin looks ok.  Plan right lower extremity thrombolysis check today in cath lab.  Please keep NPO.  [name] 09/01/2019 7:47 AM --

## 2019-09-01 NOTE — Progress Notes (Signed)
ANTICOAGULATION CONSULT NOTE  Pharmacy Consult for heparin Indication: occluded fem-pop bypass graft  Labs: Recent Labs    08/30/19 1512 08/30/19 1512 08/30/19 1800 08/30/19 2102 08/31/19 0230 08/31/19 1327 08/31/19 2206  HGB 13.1   < >  --   --  12.2*  --  11.7*  HCT 41.0  --   --   --  38.2*  --  36.6*  PLT 242  --   --   --  229  --  210  APTT  --    < > 31  --  111* 107* 61*  LABPROT  --   --  15.9*  --   --   --   --   INR  --   --  1.3*  --   --   --   --   HEPARINUNFRC  --   --   --   --  >2.20*  --  1.16*  CREATININE 1.75*  --   --  1.47* 1.45*  --   --    < > = values in this interval not displayed.    Assessment: 79 yo male with occluded fem-pop -s/p thrombolytics, tpa 0.5mg /hr infusing via sheath, for heparin.  Goal of Therapy:  HL 0.2-0.5 APTT 60-85 sec    Plan:  Continue Heparin at current rate   Phillis Knack, PharmD, BCPS  09/01/2019 12:48 AM

## 2019-09-01 NOTE — Progress Notes (Signed)
Site area: left groinfasheath Site Prior to Removal:  Level 2 hematoma lateral to insertion site w/faint bruising Pressure Applied For: 30 minutes Manual:   yes Patient Status During Pull:  stable Post Pull Site:  Level 2 Post Pull Instructions Given:  yes Post Pull Pulses Present: left dp palpable Dressing Applied:  Gauze and tegaderm Bedrest begins @ 1410 Comments:

## 2019-09-01 NOTE — Progress Notes (Signed)
ANTICOAGULATION CONSULT NOTE - Follow Up Consult  Pharmacy Consult for heparin Indication: occluded fem-pop bypass graft  Labs: Recent Labs    08/30/19 1512 08/30/19 1512 08/30/19 1800 08/30/19 2102 08/31/19 0230 08/31/19 0230 08/31/19 1327 08/31/19 2206 09/01/19 0504  HGB 13.1   < >  --   --  12.2*   < >  --  11.7* 11.7*  HCT 41.0   < >  --   --  38.2*  --   --  36.6* 36.4*  PLT 242   < >  --   --  229  --   --  210 191  APTT  --    < > 31  --  111*   < > 107* 61* 59*  LABPROT  --   --  15.9*  --   --   --   --   --   --   INR  --   --  1.3*  --   --   --   --   --   --   HEPARINUNFRC  --   --   --   --  >2.20*  --   --  1.16* 1.09*  CREATININE 1.75*  --   --  1.47* 1.45*  --   --   --   --    < > = values in this interval not displayed.    Assessment: 79yo male with occluded fem-pop -s/p thrombolytics -  tpa 0.5mg /hr running through sheath and heparin drip 800 units/hr running peripheral IV. Patient on apixaban prior to admission with last dose 3/9 AM which will cause heparin level to be falsely elevated this morning at 1.09. aPTT 59 is subtherapeutic though just under target goal of 60-85. Fibrinogen 296. Hgb and Plt stable. RN reported left femoral sheath site slightly bloody and oozing.   Goal of Therapy:  HL 0.2-0.5 APTT 60-85 sec    Plan:  Increase heparin to 850 units/hr Check heparin level at 1330 Continue alteplase 0.5mg /hr and follow up length of therapy  Follow up aptt/HL/fibrinpgen q6h  Cristela Felt, PharmD PGY1 Pharmacy Resident [employer]: [phone]  09/01/2019 7:07 AM

## 2019-09-01 NOTE — Plan of Care (Signed)
  Problem: Clinical Measurements: Goal: Ability to maintain clinical measurements within normal limits will improve Outcome: Progressing Goal: Diagnostic test results will improve Outcome: Progressing Goal: Respiratory complications will improve Outcome: Progressing Goal: Cardiovascular complication will be avoided Outcome: Progressing   Problem: Nutrition: Goal: Adequate nutrition will be maintained Outcome: Progressing   Problem: Coping: Goal: Level of anxiety will decrease Outcome: Progressing   Problem: Elimination: Goal: Will not experience complications related to urinary retention Outcome: Progressing   Problem: Pain Managment: Goal: General experience of comfort will improve Outcome: Progressing   Problem: Cardiovascular: Goal: Ability to achieve and maintain adequate cardiovascular perfusion will improve Outcome: Progressing Goal: Vascular access site(s) Level 0-1 will be maintained Outcome: Progressing

## 2019-09-01 NOTE — Progress Notes (Signed)
L femoral sheath site slightly bloody and has continued to slowly ooze all night. Site is still level 0 and original drainage was outlined. Gauze put over entry site and has been changed once dt saturation. RN will continue to monitor.

## 2019-09-01 NOTE — Progress Notes (Addendum)
Left femoral access site noted saturated with red blood including bed pad.   Site cleansed.  Cath lab notified and at bedside with this nurse.   Pressure applied for 20 mins and new pressure dressing applied.    4 hour bedrest restarted.   MD notified & if patient can tolerate, continue bedrest until the am. Can partially  sit up after 4 hours if needed.

## 2019-09-01 NOTE — Progress Notes (Signed)
Pt called for assistance at 1650 stating L groin was hurting "bad" and he "could feel his heart beat" in his groin.  Patient c/o of worsening pain in back but explains he does have chronic back pain so he's not sure.  Noted diaphoretic, c/o nausea and may vomit. Noted approximately golf ball size raised area under pressure dressing.   MD notified & MD bedside visit.   Pressure held x 40 mins & new dressing applied.   BP's cycling.   500 Bolus initiated.   Rapid response notified.

## 2019-09-01 NOTE — Significant Event (Signed)
Rapid Response Event Note  Overview: Time Called: 8984 Arrival Time: 1720 Event Type: (Left groin hematoma) Pt called out complaining of pain to his left groin. RN found him diaphoretic and c/o nausea. Hematoma noted under pressure dressing.  Initial Focused Assessment: Pt awake, alert, oriented. Following commands. Extremities are warm. Pt is pink and no longer diaphoretic. Left pedal pulse confirmed by doppler. Lung sounds are clear, diminished. Dr. Carlis Abbott at bedside applying manual pressure to left groin. Pt complaining of tingling in his left foot, but he states the tingling improves when manual pressure is not being applied to groin site. Pt able to move all extremities. PERRLA, EOMI.   Interventions: -Pressure applied to left groin, pressure dressing applied once hemostasis was achieved. -500 cc bolus -CBC, BMP   Plan of Care (if not transferred): Pt appears to be in normal spirits, he is joking and engaging in conversation with staff. Pt has good color. Groin site is soft, hematoma was manually reduced.  -Trend VS q15 minutes x1 hour following fluid bolus.  -Maintain pressure dressing until recommended discontinuation by MD -Bedrest and keep left leg straight for at least 6 hours  Event Summary: Name of Physician Notified: Dr. Carlis Abbott  Outcome: Stayed in room and stabalized Event End Time: [place]

## 2019-09-02 ENCOUNTER — Encounter (HOSPITAL_COMMUNITY): Payer: Non-veteran care

## 2019-09-02 ENCOUNTER — Encounter: Payer: Non-veteran care | Admitting: Vascular Surgery

## 2019-09-02 LAB — CBC
HCT: 33.3 % — ABNORMAL LOW (ref 39.0–52.0)
Hemoglobin: 10.9 g/dL — ABNORMAL LOW (ref 13.0–17.0)
MCH: 31.7 pg (ref 26.0–34.0)
MCHC: 32.7 g/dL (ref 30.0–36.0)
MCV: 96.8 fL (ref 80.0–100.0)
Platelets: 167 10*3/uL (ref 150–400)
RBC: 3.44 MIL/uL — ABNORMAL LOW (ref 4.22–5.81)
RDW: 13.2 % (ref 11.5–15.5)
WBC: 5.5 10*3/uL (ref 4.0–10.5)
nRBC: 0 % (ref 0.0–0.2)

## 2019-09-02 LAB — BASIC METABOLIC PANEL
Anion gap: 10 (ref 5–15)
BUN: 13 mg/dL (ref 8–23)
CO2: 21 mmol/L — ABNORMAL LOW (ref 22–32)
Calcium: 8.3 mg/dL — ABNORMAL LOW (ref 8.9–10.3)
Chloride: 107 mmol/L (ref 98–111)
Creatinine, Ser: 1.19 mg/dL (ref 0.61–1.24)
GFR calc Af Amer: >60 mL/min (ref >60)
GFR calc non Af Amer: 58 mL/min — ABNORMAL LOW (ref >60)
Glucose, Bld: 92 mg/dL (ref 70–99)
Potassium: 4.2 mmol/L (ref 3.5–5.1)
Sodium: 138 mmol/L (ref 135–145)

## 2019-09-02 LAB — GLUCOSE, CAPILLARY: Glucose-Capillary: 88 mg/dL (ref 70–99)

## 2019-09-02 MED ORDER — APIXABAN 5 MG PO TABS
5.0000 mg | ORAL_TABLET | Freq: Two times a day (BID) | ORAL | Status: DC
  Administered 2019-09-02: 5 mg via ORAL
  Filled 2019-09-02: qty 1

## 2019-09-02 MED ORDER — HYDROCODONE-ACETAMINOPHEN 5-325 MG PO TABS: 1.0000 | ORAL_TABLET | Freq: Four times a day (QID) | ORAL | 0 refills | Status: DC | PRN

## 2019-09-02 MED ORDER — OXYCODONE HCL 5 MG PO TABS: 5.0000 mg | ORAL_TABLET | Freq: Four times a day (QID) | ORAL | 0 refills | Status: DC | PRN

## 2019-09-02 MED FILL — HYDROCODON-APAP 5-325: 5-325 | 5 days supply | Qty: 20 | Fill #0

## 2019-09-02 MED FILL — Nitroglycerin IV Soln 100 MCG/ML in D5W: INTRA_ARTERIAL | Qty: 10 | Status: AC

## 2019-09-02 NOTE — Progress Notes (Signed)
ANTICOAGULATION CONSULT NOTE - Follow Up Consult  Pharmacy Consult for Eliquis Indication: occluded fem-pop bypass graft > s/p thrombectomy  Labs: Recent Labs    08/30/19 1800 08/30/19 2102 08/31/19 0230 08/31/19 0230 08/31/19 1327 08/31/19 2206 08/31/19 2206 09/01/19 0504 09/01/19 0807 09/01/19 1740  HGB  --   --  12.2*   < >  --  11.7*   < > 11.7*  --  10.9*  HCT  --   --  38.2*   < >  --  36.6*  --  36.4*  --  33.6*  PLT  --   --  229   < >  --  210  --  191  --  178  APTT 31   < > 111*   < > 107* 61*  --  59*  --   --   LABPROT 15.9*  --   --   --   --   --   --   --   --   --   INR 1.3*  --   --   --   --   --   --   --   --   --   HEPARINUNFRC  --   --  >2.20*  --   --  1.16*  --  1.09*  --   --   CREATININE  --    < > 1.45*  --   --   --   --   --  1.33* 1.37*   < > = values in this interval not displayed.    Assessment: 79yo male with occluded fem-pop -s/p thrombolytics -  tpa 0.5mg /hr running through sheath and heparin drip 800 units/hr running peripheral IV. Patient on apixaban prior to admission with last dose 3/9 AM which will cause heparin level to be falsely elevated this morning at 1.09. aPTT 59 is subtherapeutic though just under target goal of 60-85. Fibrinogen 296. Hgb and Plt stable. RN reported left femoral sheath site slightly bloody and oozing.   Pharmacy asked to resume Eliquis today.  Planning discharge later this morning.  Goal of Therapy:  HL 0.2-0.5 APTT 60-85 sec    Plan:  Eliquis 5 mg BID.  Marguerite Olea, [facility] Clinical Pharmacist Phone [phone]  09/02/2019 8:07 AM

## 2019-09-02 NOTE — Progress Notes (Signed)
Frequently checking L groin- hematoma stable and gauze continues to be CDI. Pt trial lying on side while keeping left leg straight (chronic back pain acting up w/ prolonged bedrest) and tolerated well. Pulses easily found via doppler. Vitals continue to be stable. Will continue to closely monitor.

## 2019-09-02 NOTE — Progress Notes (Signed)
09/02/2019 1200 Discharge AVS meds taken today and those due this evening reviewed.  Follow-up appointments and when to call md reviewed.  D/C IV and TELE.  Questions and concerns addressed.   D/C home per orders. [place]

## 2019-09-02 NOTE — Progress Notes (Signed)
MOBILITY TEAM - Progress Note   09/02/19 1130  Mobility  Activity Ambulated in hall  Level of Assistance Independent  Distance Ambulated (ft) 400 ft  Mobility Response Tolerated well  Bed Position  (seated in chair)   C/o LE soreness; educ on gentle LE ROM/stretches. Patient preparing to d/c home soon.  Mabeline Caras, PT, DPT Mobility Team Pager [phone]

## 2019-09-02 NOTE — Discharge Instructions (Signed)
° °  [facility] [place] ° °Discharge Instructions ° °Lower Extremity Angiogram; Angioplasty/Stenting ° °Please refer to the following instructions for your post-procedure care. Your surgeon or physician assistant will discuss any changes with you. ° °Activity ° °Avoid lifting more than 8 pounds (1 gallons of milk) for 72 hours (3 days) after your procedure. You may walk as much as you can tolerate. It's OK to drive after 72 hours. ° °Bathing/Showering ° °You may shower the day after your procedure. If you have a bandage, you may remove it at 24- 48 hours. Clean your incision site with mild soap and water. Pat the area dry with a clean towel. ° °Diet ° °Resume your pre-procedure diet. There are no special food restrictions following this procedure. All patients with peripheral vascular disease should follow a low fat/low cholesterol diet. In order to heal from your surgery, it is CRITICAL to get adequate nutrition. Your body requires vitamins, minerals, and protein. Vegetables are the best source of vitamins and minerals. Vegetables also provide the perfect balance of protein. Processed food has little nutritional value, so try to avoid this. ° °Medications ° °Resume taking all of your medications unless your doctor tells you not to. If your incision is causing pain, you may take over-the-counter pain relievers such as acetaminophen (Tylenol) ° °Follow Up ° °Follow up will be arranged at the time of your procedure. You may have an office visit scheduled or may be scheduled for surgery. Ask your surgeon if you have any questions. ° °Please call us immediately for any of the following conditions: °•Severe or worsening pain your legs or feet at rest or with walking. °•Increased pain, redness, drainage at your groin puncture site. °•Fever of 101 degrees or higher. °•If you have any mild or slow bleeding from your puncture site: lie down, apply firm constant pressure over the area with a piece of  gauze or a clean wash cloth for 30 minutes- no peeking!, call 911 right away if you are still bleeding after 30 minutes, or if the bleeding is heavy and unmanageable. ° °Reduce your risk factors of vascular disease: ° °Stop smoking. If you would like help call [facility] at 1-800-QUIT-NOW (1-800-784-8669) or [place] at 336-586-4000. °Manage your cholesterol °Maintain a desired weight °Control your diabetes °Keep your blood pressure down ° °If you have any questions, please call the office at [phone] ° °

## 2019-09-02 NOTE — Progress Notes (Signed)
MOBILITY TEAM - Progress Note   09/02/19 0900  Mobility  Activity Ambulated in hall;Ambulated to bathroom  Level of Assistance Independent  Distance Ambulated (ft) 380 ft  Mobility Response Tolerated well  Bed Position  (seated in recliner)   Post-activity: BP 174/55  Patient independent with ambulation. Able to have bowel movement. Incision site clean/intact before and after mobility session.   Mabeline Caras, PT, DPT Mobility Team Pager [phone]

## 2019-09-02 NOTE — Progress Notes (Addendum)
[facility] [place]  Subjective  - doing well over all.  Had episode of Nausea last night without vomiting.   Objective (!) 163/59 (!) 59 98.5 F (36.9 C) (Oral) 15 94%  Intake/Output Summary (Last 24 hours) at 09/02/2019 0719 Last data filed at 09/02/2019 9735 Gross per 24 hour  Intake 1922.03 ml  Output 750 ml  Net 1172.03 ml    Brisk dopplers DP/PT right LE, DP peroneal left LE Left groin without hematoma, firmness surrounding, but no skin expansion.   Lungs non labored breathing Heart RRR  Assessment/Planning: POD # 1 Procedure Performed: 1.  Lysis recheck with right lower extremity angiogram 2.  Penumbra mechanical thrombectomy right tibioperoneal trunk, peroneal artery and posterior tibial artery 3.  2-1/2 mm balloon angioplasty right peroneal artery 4.  Stent of TP trunk with 5 x 60 mm Tigris 5.  Stent of proximal SFA in the common femoral  POD# 2  Procedure Performed: 1.  Ultrasound-guided access of the left common femoral artery 2.  Aortogram 3.  Right lower extremity arteriogram with selection of third-order branches 4.  Placement of thrombolytic's catheter in the right SFA popliteal and tibioperoneal trunk Viabahn stents with initiation of catheter-directed thrombolysis 5.  53 minutes of monitored moderate conscious sedation time  Patient continued on Plavix Plan to restart Eliquis Possible discharge this am pending ambulation and PO tolerable.   F/U in office with Karandeep Resende or PA on a Friday.  Arterial duplex and ABI's in 2-3 weeks.   Roxy Horseman 09/02/2019 7:19 AM --  Laboratory Lab Results: Recent Labs    09/01/19 0504 09/01/19 1740  WBC 5.5 5.4  HGB 11.7* 10.9*  HCT 36.4* 33.6*  PLT 191 178   BMET Recent Labs    09/01/19 0807 09/01/19 1740  NA 140 138  K 4.8 4.3  CL 108 108  CO2 25 21*  GLUCOSE 108* 110*  BUN 16 14  CREATININE 1.33* 1.37*  CALCIUM 8.4* 8.0*    COAG Lab Results  Component Value  Date   INR 1.3 (H) 08/30/2019   INR 1.2 07/23/2019   INR 1.28 01/09/2018   No results found for: PTT  I have independently interviewed and examined patient and agree with PA assessment and plan above.   Devontay Celaya C. [name], MD [facility] [place] Office: [phone] Pager: [phone]

## 2019-09-05 NOTE — Discharge Summary (Signed)
[facility] Discharge Summary   Patient ID:  Donald Gordon MRN: [id number] DOB/AGE: 1941/02/11 79 y.o.  Admit date: 08/30/2019 Discharge date: 09/02/2019 Date of Surgery: 09/01/2019 Surgeon: Surgeon(s): Waynetta Sandy, MD  Admission Diagnosis: Peripheral arterial disease ([place]) [I73.9] PAD (peripheral artery disease) ([place]) [I73.9] Occlusion of right femoropopliteal bypass graft, initial encounter [facility]) [L45.625W] Critical lower limb ischemia [I99.8]  Discharge Diagnoses:  Peripheral arterial disease ([place]) [I73.9] PAD (peripheral artery disease) ([place]) [I73.9] Occlusion of right femoropopliteal bypass graft, initial encounter ([place]) [L89.373S] Critical lower limb ischemia [I99.8]  Secondary Diagnoses: Past Medical History:  Diagnosis Date  . Arthritis    "knees, elbows" (03/18/2018)  . Chronic edema    a. Chronic RLE edema.  Marland Kitchen COPD (chronic obstructive pulmonary disease) ([place])   . Coronary artery disease    a. MI s/p balloon 1996, details unclear.  Marland Kitchen Dysrhythmia    PROXIMAL MARGIN FIBRILATION  . GERD (gastroesophageal reflux disease)   . Hyperlipidemia   . Hypertension   . Myocardial infarction ([place]) 1996  . PAD (peripheral artery disease) ([place])    a. s/p stenting 08/2012, 02/2013.   Marland Kitchen PAF (paroxysmal atrial fibrillation) ([place])   . Sleep apnea    "have mask; have to start wearing it" (03/18/2018)  . Stroke [facility]) ~ 2014   denies residual on 03/18/2018  . Thrombosis of lower extremity    a. Listed on patient's medical bracelet - at [place].  Marland Kitchen Type II diabetes mellitus (HCC)     Procedure(s): PERIPHERAL VASCULAR THROMBECTOMY PERIPHERAL VASCULAR INTERVENTION PERIPHERAL VASCULAR BALLOON ANGIOPLASTY  Discharged Condition: good  HPI: Donald Gordon is a 79 y.o. male, who initially presented to [facility] on 01/09/2018 with an ischemic leg.  Prior to that he had had some form of vascular procedure at the [place].  On this date Dr. Bridgett Larsson performed thrombectomy  of the right anterior tibial and peroneal artery as well as a right femoral to below-knee popliteal artery bypass graft with propatent.  He placed a bovine patch on the below-knee popliteal artery.  4 compartment fasciotomies were performed.  On 01/11/2018 he had closure of the fasciotomies.  He then returned on 07/23/2019 with recurrent symptoms.  He underwent angiography on 07/25/2019 by Dr. [name].  His bypass was found to be occluded.     Hospital Course:  Donald Gordon is a 79 y.o. male is S/P right LE Procedure(s): PERIPHERAL VASCULAR THROMBECTOMY PERIPHERAL VASCULAR INTERVENTION PERIPHERAL VASCULAR BALLOON ANGIOPLASTY Dr. Carlis Abbott took the patient to the PV lab and performed Placement of thrombolytic's catheter in the right SFA popliteal and tibioperoneal trunk Viabahn stents with initiation of catheter-directed thrombolysis.    On 09/01/19 Dr. [name] took the patient back to the [facility] lab for : Procedure Performed: 1.  Lysis recheck with right lower extremity angiogram 2.  Penumbra mechanical thrombectomy right tibioperoneal trunk, peroneal artery and posterior tibial artery 3.  2-1/2 mm balloon angioplasty right peroneal artery 4.  Stent of TP trunk with 5 x 60 mm [place] 5.  Stent of proximal SFA in the common femoral artery with 6 x 50mm [place]  Patent right LE arterial flow was established with brisk dopplers DP/PT.   His Eliquis andPlavix  were restarted.  He was discharged in stable condition.  Consults:  Treatment Team:  Serafina Mitchell, MD Waynetta Sandy, MD [name], MD  Significant Diagnostic Studies: CBC Lab Results  Component Value Date   WBC 5.5 09/02/2019   HGB 10.9 (L) 09/02/2019  HCT 33.3 (L) 09/02/2019   MCV 96.8 09/02/2019   PLT 167 09/02/2019    BMET    Component Value Date/Time   NA 138 09/02/2019 0655   K 4.2 09/02/2019 0655   CL 107 09/02/2019 0655   CO2 21 (L) 09/02/2019 0655   GLUCOSE 92 09/02/2019 0655   BUN 13 09/02/2019 0655    CREATININE 1.19 09/02/2019 0655   CALCIUM 8.3 (L) 09/02/2019 0655   GFRNONAA 58 (L) 09/02/2019 0655   GFRAA >60 09/02/2019 0655   COAG Lab Results  Component Value Date   INR 1.3 (H) 08/30/2019   INR 1.2 07/23/2019   INR 1.28 01/09/2018     Disposition:  Discharge to :Home Discharge Instructions    Call MD for:  redness, tenderness, or signs of infection (pain, swelling, bleeding, redness, odor or green/yellow discharge around incision site)   Complete by: As directed    Call MD for:  severe or increased pain, loss or decreased feeling  in affected limb(s)   Complete by: As directed    Call MD for:  temperature >100.5   Complete by: As directed    Resume previous diet   Complete by: As directed      Allergies as of 09/02/2019      Reactions   Oxycodone Nausea Only   Statins Other (See Comments)   Muscle and Bone pain Muscle and Bone pain      Medication List    TAKE these medications   acetaminophen 500 MG tablet Commonly known as: TYLENOL Take 500 mg by mouth 2 (two) times daily as needed (for pain or headaches).   amiodarone 400 MG tablet Commonly known as: PACERONE Take 1 tablet (400 mg total) by mouth 2 (two) times daily.   clopidogrel 75 MG tablet Commonly known as: PLAVIX Take 1 tablet (75 mg total) by mouth daily.   Eliquis 5 MG Tabs tablet Generic drug: apixaban Take 5 mg by mouth 2 (two) times daily.   ezetimibe 10 MG tablet Commonly known as: ZETIA Take 10 mg by mouth every evening.   HYDROcodone-acetaminophen 5-325 MG tablet Commonly known as: Norco Take 1 tablet by mouth every 6 (six) hours as needed for moderate pain.   IRON PO Take 1 tablet by mouth daily.   memantine 10 MG tablet Commonly known as: NAMENDA Take 10 mg by mouth 2 (two) times daily.   metoprolol tartrate 50 MG tablet Commonly known as: LOPRESSOR Take 50 mg by mouth 2 (two) times daily.   nitroGLYCERIN 0.4 MG SL tablet Commonly known as: NITROSTAT Place 0.4 mg under  the tongue every 5 (five) minutes as needed for chest pain.   pantoprazole 40 MG tablet Commonly known as: PROTONIX Take 40 mg by mouth daily.   ramipril 10 MG capsule Commonly known as: ALTACE Take 10 mg by mouth 2 (two) times daily.   rivastigmine 6 MG capsule Commonly known as: EXELON Take 6 mg by mouth 2 (two) times daily.   VITAMIN B-12 PO Take 1 tablet by mouth daily.   VITAMIN D3 PO Take 1 tablet by mouth daily.      Verbal and written Discharge instructions given to the patient. Wound care per Discharge AVS Follow-up Information    Waynetta Sandy, MD Follow up in 2 week(s).   Specialties: Vascular Surgery, Cardiology Why: office will call Contact information: [address] [place] [address] [phone]           Signed: Roxy Horseman 09/05/2019,  12:02 PM

## 2019-09-30 ENCOUNTER — Other Ambulatory Visit: Payer: Self-pay | Admitting: *Deleted

## 2019-09-30 DIAGNOSIS — I998 Other disorder of circulatory system: Secondary | ICD-10-CM

## 2019-09-30 DIAGNOSIS — I743 Embolism and thrombosis of arteries of the lower extremities: Secondary | ICD-10-CM

## 2019-10-06 ENCOUNTER — Telehealth (HOSPITAL_COMMUNITY): Payer: Self-pay

## 2019-10-06 NOTE — Telephone Encounter (Signed)

## 2019-10-07 ENCOUNTER — Ambulatory Visit (INDEPENDENT_AMBULATORY_CARE_PROVIDER_SITE_OTHER): Payer: Medicare Other | Admitting: Physician Assistant

## 2019-10-07 ENCOUNTER — Encounter (HOSPITAL_COMMUNITY): Payer: No Typology Code available for payment source

## 2019-10-07 ENCOUNTER — Other Ambulatory Visit: Payer: Self-pay

## 2019-10-07 ENCOUNTER — Ambulatory Visit (HOSPITAL_COMMUNITY)
Admission: RE | Admit: 2019-10-07 | Discharge: 2019-10-07 | Disposition: A | Discharge status: Home / Self Care | Payer: Medicare Other | Source: Ambulatory Visit | Attending: Vascular Surgery | Admitting: Vascular Surgery

## 2019-10-07 VITALS — BP 150/62 | HR 59 | Temp 98.2°F | Resp 16 | Ht 69.0 in | Wt 187.0 lb

## 2019-10-07 DIAGNOSIS — I739 Peripheral vascular disease, unspecified: Secondary | ICD-10-CM | POA: Diagnosis not present

## 2019-10-07 DIAGNOSIS — I743 Embolism and thrombosis of arteries of the lower extremities: Secondary | ICD-10-CM | POA: Diagnosis not present

## 2019-10-07 DIAGNOSIS — I998 Other disorder of circulatory system: Secondary | ICD-10-CM | POA: Insufficient documentation

## 2019-10-07 NOTE — Progress Notes (Signed)
POST OPERATIVE OFFICE NOTE    CC:  F/u for right lower extremity thrombolysis and stent placement.  HPI:  This is a 79 y.o. male who is s/p thrombolysis of right lower extremity femoropopliteal bypass and previous placed stents.  On September 01, 2019 Dr. Gwenlyn Saran took the patient to the operating room where he underwent penumbra mechanical thrombectomy of his right TP trunk, peroneal artery and posterior tibial artery.  A stent was placed in his TP trunk as well as 1 in the proximal SFA.  Patient was sent home on Plavix and Eliquis.  He returns today for routine follow-up.  He denies claudication or rest pain.  He stays indoors mostly due to Covid pandemic, however he does occasionally cut his own grass.  He is compliant with Plavix, Eliquis and Zetia  Allergies  Allergen Reactions  . Oxycodone Nausea Only  . Statins Other (See Comments)    Muscle and Bone pain Muscle and Bone pain    Current Outpatient Medications  Medication Sig Dispense Refill  . acetaminophen (TYLENOL) 500 MG tablet Take 500 mg by mouth 2 (two) times daily as needed (for pain or headaches).     Marland Kitchen amiodarone (PACERONE) 400 MG tablet Take 1 tablet (400 mg total) by mouth 2 (two) times daily. 28 tablet 0  . apixaban (ELIQUIS) 5 MG TABS tablet Take 5 mg by mouth 2 (two) times daily.    . Cholecalciferol (VITAMIN D3 PO) Take 1 tablet by mouth daily.    . clopidogrel (PLAVIX) 75 MG tablet Take 1 tablet (75 mg total) by mouth daily. 90 tablet 1  . Cyanocobalamin (VITAMIN B-12 PO) Take 1 tablet by mouth daily.    Marland Kitchen ezetimibe (ZETIA) 10 MG tablet Take 10 mg by mouth every evening.     . Ferrous Sulfate (IRON PO) Take 1 tablet by mouth daily.    Marland Kitchen HYDROcodone-acetaminophen (NORCO) 5-325 MG tablet Take 1 tablet by mouth every 6 (six) hours as needed for moderate pain. 20 tablet 0  . memantine (NAMENDA) 10 MG tablet Take 10 mg by mouth 2 (two) times daily.    . metoprolol (LOPRESSOR) 50 MG tablet Take 50 mg by mouth 2 (two) times  daily.    . nitroGLYCERIN (NITROSTAT) 0.4 MG SL tablet Place 0.4 mg under the tongue every 5 (five) minutes as needed for chest pain.    . pantoprazole (PROTONIX) 40 MG tablet Take 40 mg by mouth daily.    . ramipril (ALTACE) 10 MG capsule Take 10 mg by mouth 2 (two) times daily.    . rivastigmine (EXELON) 6 MG capsule Take 6 mg by mouth 2 (two) times daily.     No current facility-administered medications for this visit.     ROS:  See HPI  Physical Exam:  Vitals:   10/07/19 1304  Weight: 187 lb (84.8 kg)  Height: 5\' 9"  (1.753 m)   General appearance: The patient is alert and oriented x4 and in no apparent distress Extremities: Both feet are warm and well perfused.  He has a 2+ palpable left dorsalis pedis pulse.  1+ left popliteal pulse.  2+ palpable bilateral femoral pulses.  Skin is intact without ischemic changes or wounds. Cardiac: Heart rate and rhythm regular.  No carotid bruits Lungs: Clear to auscultation bilaterally   ABI/TBIToday's ABIToday's TBIPrevious ABIPrevious TBI  +-------+-----------+-----------+------------+------------+  Right 0.95    0.58    0.41    0.10      +-------+-----------+-----------+------------+------------+  Left  0.94  0.63    0.91    0.49      +-------+-----------+-----------+------------+------------+   Waveforms are biphasic/triphasic bilaterally  Assessment/Plan:  This is a 79 y.o. male who is s/p: Thrombolytic and mechanical thrombectomy right femoral popliteal bypass and stents.  Status post stenting of TP trunk and proximal SFA in the common femoral artery most recently on September 01, 2019.   -The patient is currently without signs or symptoms of acute or chronic ischemia. -Encourage continued walking and exercise.  Continue Plavix, Eliquis and Zetia. -Follow up in 6 months with ABIs and duplex ultrasound of right lower extremity -Return to clinic for painful lower extremity, skin changes or  nonhealing wounds.  Risa Grill, PA-C [facility] [phone]  Clinic MD: Pain

## 2019-10-10 ENCOUNTER — Other Ambulatory Visit: Payer: Self-pay | Admitting: *Deleted

## 2019-10-10 DIAGNOSIS — I739 Peripheral vascular disease, unspecified: Secondary | ICD-10-CM

## 2020-01-16 DIAGNOSIS — K635 Polyp of colon: Secondary | ICD-10-CM | POA: Diagnosis not present

## 2020-01-16 DIAGNOSIS — D124 Benign neoplasm of descending colon: Secondary | ICD-10-CM | POA: Diagnosis not present

## 2020-01-16 DIAGNOSIS — D122 Benign neoplasm of ascending colon: Secondary | ICD-10-CM | POA: Diagnosis not present

## 2020-01-16 DIAGNOSIS — D120 Benign neoplasm of cecum: Secondary | ICD-10-CM | POA: Diagnosis not present

## 2020-01-16 DIAGNOSIS — Z86010 Personal history of colonic polyps: Secondary | ICD-10-CM | POA: Diagnosis not present

## 2020-01-16 DIAGNOSIS — D509 Iron deficiency anemia, unspecified: Secondary | ICD-10-CM | POA: Diagnosis not present

## 2020-01-16 DIAGNOSIS — D125 Benign neoplasm of sigmoid colon: Secondary | ICD-10-CM | POA: Diagnosis not present

## 2020-01-16 DIAGNOSIS — K31819 Angiodysplasia of stomach and duodenum without bleeding: Secondary | ICD-10-CM | POA: Diagnosis not present

## 2020-01-16 DIAGNOSIS — K31811 Angiodysplasia of stomach and duodenum with bleeding: Secondary | ICD-10-CM | POA: Diagnosis not present

## 2020-01-16 DIAGNOSIS — K5730 Diverticulosis of large intestine without perforation or abscess without bleeding: Secondary | ICD-10-CM | POA: Diagnosis not present

## 2020-01-17 ENCOUNTER — Encounter (HOSPITAL_COMMUNITY): Payer: Self-pay | Admitting: *Deleted

## 2020-01-17 ENCOUNTER — Other Ambulatory Visit: Payer: Self-pay

## 2020-01-17 ENCOUNTER — Inpatient Hospital Stay (HOSPITAL_COMMUNITY)
Admission: EM | Admit: 2020-01-17 | Discharge: 2020-01-21 | DRG: 272 | Disposition: A | Discharge status: Home / Self Care | Payer: No Typology Code available for payment source | Attending: Vascular Surgery | Admitting: Vascular Surgery

## 2020-01-17 DIAGNOSIS — I998 Other disorder of circulatory system: Secondary | ICD-10-CM

## 2020-01-17 DIAGNOSIS — Y832 Surgical operation with anastomosis, bypass or graft as the cause of abnormal reaction of the patient, or of later complication, without mention of misadventure at the time of the procedure: Secondary | ICD-10-CM | POA: Diagnosis present

## 2020-01-17 DIAGNOSIS — I252 Old myocardial infarction: Secondary | ICD-10-CM

## 2020-01-17 DIAGNOSIS — Z885 Allergy status to narcotic agent status: Secondary | ICD-10-CM

## 2020-01-17 DIAGNOSIS — I70229 Atherosclerosis of native arteries of extremities with rest pain, unspecified extremity: Secondary | ICD-10-CM | POA: Diagnosis present

## 2020-01-17 DIAGNOSIS — Z79899 Other long term (current) drug therapy: Secondary | ICD-10-CM

## 2020-01-17 DIAGNOSIS — I739 Peripheral vascular disease, unspecified: Secondary | ICD-10-CM

## 2020-01-17 DIAGNOSIS — Z8249 Family history of ischemic heart disease and other diseases of the circulatory system: Secondary | ICD-10-CM

## 2020-01-17 DIAGNOSIS — Z981 Arthrodesis status: Secondary | ICD-10-CM

## 2020-01-17 DIAGNOSIS — Z955 Presence of coronary angioplasty implant and graft: Secondary | ICD-10-CM

## 2020-01-17 DIAGNOSIS — Z87891 Personal history of nicotine dependence: Secondary | ICD-10-CM

## 2020-01-17 DIAGNOSIS — I10 Essential (primary) hypertension: Secondary | ICD-10-CM | POA: Diagnosis present

## 2020-01-17 DIAGNOSIS — I70621 Atherosclerosis of nonbiological bypass graft(s) of the extremities with rest pain, right leg: Secondary | ICD-10-CM | POA: Diagnosis present

## 2020-01-17 DIAGNOSIS — Z961 Presence of intraocular lens: Secondary | ICD-10-CM | POA: Diagnosis present

## 2020-01-17 DIAGNOSIS — Z86718 Personal history of other venous thrombosis and embolism: Secondary | ICD-10-CM

## 2020-01-17 DIAGNOSIS — I2510 Atherosclerotic heart disease of native coronary artery without angina pectoris: Secondary | ICD-10-CM | POA: Diagnosis present

## 2020-01-17 DIAGNOSIS — Z9842 Cataract extraction status, left eye: Secondary | ICD-10-CM

## 2020-01-17 DIAGNOSIS — Z888 Allergy status to other drugs, medicaments and biological substances status: Secondary | ICD-10-CM

## 2020-01-17 DIAGNOSIS — T82868A Thrombosis of vascular prosthetic devices, implants and grafts, initial encounter: Secondary | ICD-10-CM

## 2020-01-17 DIAGNOSIS — Z8673 Personal history of transient ischemic attack (TIA), and cerebral infarction without residual deficits: Secondary | ICD-10-CM

## 2020-01-17 DIAGNOSIS — N1831 Chronic kidney disease, stage 3a: Secondary | ICD-10-CM

## 2020-01-17 DIAGNOSIS — Z20822 Contact with and (suspected) exposure to covid-19: Secondary | ICD-10-CM | POA: Diagnosis present

## 2020-01-17 DIAGNOSIS — Z7902 Long term (current) use of antithrombotics/antiplatelets: Secondary | ICD-10-CM

## 2020-01-17 DIAGNOSIS — I480 Paroxysmal atrial fibrillation: Secondary | ICD-10-CM | POA: Diagnosis present

## 2020-01-17 DIAGNOSIS — K219 Gastro-esophageal reflux disease without esophagitis: Secondary | ICD-10-CM | POA: Diagnosis present

## 2020-01-17 DIAGNOSIS — E785 Hyperlipidemia, unspecified: Secondary | ICD-10-CM | POA: Diagnosis present

## 2020-01-17 DIAGNOSIS — Z7984 Long term (current) use of oral hypoglycemic drugs: Secondary | ICD-10-CM

## 2020-01-17 DIAGNOSIS — G4730 Sleep apnea, unspecified: Secondary | ICD-10-CM | POA: Diagnosis present

## 2020-01-17 DIAGNOSIS — Z9889 Other specified postprocedural states: Secondary | ICD-10-CM | POA: Diagnosis present

## 2020-01-17 DIAGNOSIS — Z7901 Long term (current) use of anticoagulants: Secondary | ICD-10-CM

## 2020-01-17 DIAGNOSIS — Z86010 Personal history of colonic polyps: Secondary | ICD-10-CM

## 2020-01-17 DIAGNOSIS — Z95820 Peripheral vascular angioplasty status with implants and grafts: Secondary | ICD-10-CM

## 2020-01-17 DIAGNOSIS — Z9841 Cataract extraction status, right eye: Secondary | ICD-10-CM

## 2020-01-17 DIAGNOSIS — J449 Chronic obstructive pulmonary disease, unspecified: Secondary | ICD-10-CM | POA: Diagnosis present

## 2020-01-17 NOTE — ED Triage Notes (Signed)
Patient states that he has had some pain in his right leg, hx of vascular stents. Pain in the right thigh, radiates down the right leg. Seen at [facility], sent here so he can see vascular. Palpable pulses. Good color in extremity. Pt has been off plavix for 5 days and eliquis for 2 days d/t colonoscopy yesterday.

## 2020-01-18 ENCOUNTER — Encounter (HOSPITAL_COMMUNITY)
Admission: EM | Disposition: A | Discharge status: Home / Self Care | Payer: Self-pay | Source: Home / Self Care | Attending: Vascular Surgery

## 2020-01-18 DIAGNOSIS — E785 Hyperlipidemia, unspecified: Secondary | ICD-10-CM | POA: Diagnosis not present

## 2020-01-18 DIAGNOSIS — T82868A Thrombosis of vascular prosthetic devices, implants and grafts, initial encounter: Secondary | ICD-10-CM | POA: Diagnosis present

## 2020-01-18 DIAGNOSIS — Y832 Surgical operation with anastomosis, bypass or graft as the cause of abnormal reaction of the patient, or of later complication, without mention of misadventure at the time of the procedure: Secondary | ICD-10-CM | POA: Diagnosis present

## 2020-01-18 DIAGNOSIS — Z95820 Peripheral vascular angioplasty status with implants and grafts: Secondary | ICD-10-CM | POA: Diagnosis not present

## 2020-01-18 DIAGNOSIS — I998 Other disorder of circulatory system: Secondary | ICD-10-CM | POA: Diagnosis present

## 2020-01-18 DIAGNOSIS — I739 Peripheral vascular disease, unspecified: Secondary | ICD-10-CM | POA: Diagnosis not present

## 2020-01-18 DIAGNOSIS — Z981 Arthrodesis status: Secondary | ICD-10-CM | POA: Diagnosis not present

## 2020-01-18 DIAGNOSIS — Z79899 Other long term (current) drug therapy: Secondary | ICD-10-CM | POA: Diagnosis not present

## 2020-01-18 DIAGNOSIS — I252 Old myocardial infarction: Secondary | ICD-10-CM | POA: Diagnosis not present

## 2020-01-18 DIAGNOSIS — I70229 Atherosclerosis of native arteries of extremities with rest pain, unspecified extremity: Secondary | ICD-10-CM | POA: Diagnosis present

## 2020-01-18 DIAGNOSIS — G4730 Sleep apnea, unspecified: Secondary | ICD-10-CM | POA: Diagnosis present

## 2020-01-18 DIAGNOSIS — Z7902 Long term (current) use of antithrombotics/antiplatelets: Secondary | ICD-10-CM | POA: Diagnosis not present

## 2020-01-18 DIAGNOSIS — K219 Gastro-esophageal reflux disease without esophagitis: Secondary | ICD-10-CM | POA: Diagnosis present

## 2020-01-18 DIAGNOSIS — Z961 Presence of intraocular lens: Secondary | ICD-10-CM | POA: Diagnosis present

## 2020-01-18 DIAGNOSIS — Z8673 Personal history of transient ischemic attack (TIA), and cerebral infarction without residual deficits: Secondary | ICD-10-CM | POA: Diagnosis not present

## 2020-01-18 DIAGNOSIS — I70621 Atherosclerosis of nonbiological bypass graft(s) of the extremities with rest pain, right leg: Secondary | ICD-10-CM | POA: Diagnosis present

## 2020-01-18 DIAGNOSIS — Z885 Allergy status to narcotic agent status: Secondary | ICD-10-CM | POA: Diagnosis not present

## 2020-01-18 DIAGNOSIS — Z9841 Cataract extraction status, right eye: Secondary | ICD-10-CM | POA: Diagnosis not present

## 2020-01-18 DIAGNOSIS — I2510 Atherosclerotic heart disease of native coronary artery without angina pectoris: Secondary | ICD-10-CM | POA: Diagnosis present

## 2020-01-18 DIAGNOSIS — Z86010 Personal history of colonic polyps: Secondary | ICD-10-CM | POA: Diagnosis not present

## 2020-01-18 DIAGNOSIS — Z888 Allergy status to other drugs, medicaments and biological substances status: Secondary | ICD-10-CM | POA: Diagnosis not present

## 2020-01-18 DIAGNOSIS — J449 Chronic obstructive pulmonary disease, unspecified: Secondary | ICD-10-CM | POA: Diagnosis present

## 2020-01-18 DIAGNOSIS — Z9842 Cataract extraction status, left eye: Secondary | ICD-10-CM | POA: Diagnosis not present

## 2020-01-18 DIAGNOSIS — I480 Paroxysmal atrial fibrillation: Secondary | ICD-10-CM | POA: Diagnosis not present

## 2020-01-18 DIAGNOSIS — Z87891 Personal history of nicotine dependence: Secondary | ICD-10-CM | POA: Diagnosis not present

## 2020-01-18 DIAGNOSIS — I10 Essential (primary) hypertension: Secondary | ICD-10-CM | POA: Diagnosis present

## 2020-01-18 DIAGNOSIS — Z7984 Long term (current) use of oral hypoglycemic drugs: Secondary | ICD-10-CM | POA: Diagnosis not present

## 2020-01-18 DIAGNOSIS — Z20822 Contact with and (suspected) exposure to covid-19: Secondary | ICD-10-CM | POA: Diagnosis present

## 2020-01-18 HISTORY — PX: LOWER EXTREMITY ANGIOGRAPHY: CATH118251

## 2020-01-18 HISTORY — PX: PERIPHERAL VASCULAR THROMBECTOMY: CATH118306

## 2020-01-18 LAB — I-STAT CHEM 8, ED
BUN: 25 mg/dL — ABNORMAL HIGH (ref 8–23)
Calcium, Ion: 1.06 mmol/L — ABNORMAL LOW (ref 1.15–1.40)
Chloride: 104 mmol/L (ref 98–111)
Creatinine, Ser: 1.7 mg/dL — ABNORMAL HIGH (ref 0.61–1.24)
Glucose, Bld: 97 mg/dL (ref 70–99)
HCT: 41 % (ref 39.0–52.0)
Hemoglobin: 13.9 g/dL (ref 13.0–17.0)
Potassium: 4.4 mmol/L (ref 3.5–5.1)
Sodium: 138 mmol/L (ref 135–145)
TCO2: 23 mmol/L (ref 22–32)

## 2020-01-18 LAB — CBC WITH DIFFERENTIAL/PLATELET
Abs Immature Granulocytes: 0.05 10*3/uL (ref 0.00–0.07)
Basophils Absolute: 0 10*3/uL (ref 0.0–0.1)
Basophils Relative: 0 %
Eosinophils Absolute: 0.1 10*3/uL (ref 0.0–0.5)
Eosinophils Relative: 2 %
HCT: 42.1 % (ref 39.0–52.0)
Hemoglobin: 13.6 g/dL (ref 13.0–17.0)
Immature Granulocytes: 1 %
Lymphocytes Relative: 20 %
Lymphs Abs: 1.4 10*3/uL (ref 0.7–4.0)
MCH: 31.9 pg (ref 26.0–34.0)
MCHC: 32.3 g/dL (ref 30.0–36.0)
MCV: 98.8 fL (ref 80.0–100.0)
Monocytes Absolute: 1.4 10*3/uL — ABNORMAL HIGH (ref 0.1–1.0)
Monocytes Relative: 20 %
Neutro Abs: 3.9 10*3/uL (ref 1.7–7.7)
Neutrophils Relative %: 57 %
Platelets: 184 10*3/uL (ref 150–400)
RBC: 4.26 MIL/uL (ref 4.22–5.81)
RDW: 13.6 % (ref 11.5–15.5)
WBC: 6.9 10*3/uL (ref 4.0–10.5)
nRBC: 0 % (ref 0.0–0.2)

## 2020-01-18 LAB — BASIC METABOLIC PANEL
Anion gap: 10 (ref 5–15)
BUN: 22 mg/dL (ref 8–23)
CO2: 23 mmol/L (ref 22–32)
Calcium: 9.2 mg/dL (ref 8.9–10.3)
Chloride: 104 mmol/L (ref 98–111)
Creatinine, Ser: 1.7 mg/dL — ABNORMAL HIGH (ref 0.61–1.24)
GFR calc Af Amer: 43 mL/min — ABNORMAL LOW (ref >60)
GFR calc non Af Amer: 38 mL/min — ABNORMAL LOW (ref >60)
Glucose, Bld: 101 mg/dL — ABNORMAL HIGH (ref 70–99)
Potassium: 4.5 mmol/L (ref 3.5–5.1)
Sodium: 137 mmol/L (ref 135–145)

## 2020-01-18 LAB — PROTIME-INR
INR: 1.1 (ref 0.8–1.2)
Prothrombin Time: 13.3 seconds (ref 11.4–15.2)

## 2020-01-18 LAB — SARS CORONAVIRUS 2 BY RT PCR (HOSPITAL ORDER, PERFORMED IN ~~LOC~~ HOSPITAL LAB): SARS Coronavirus 2: NEGATIVE

## 2020-01-18 LAB — APTT: aPTT: 23 seconds — ABNORMAL LOW (ref 24–36)

## 2020-01-18 LAB — HEPARIN LEVEL (UNFRACTIONATED): Heparin Unfractionated: 0.61 IU/mL (ref 0.30–0.70)

## 2020-01-18 SURGERY — PERIPHERAL VASCULAR THROMBECTOMY
Anesthesia: LOCAL | Laterality: Right

## 2020-01-18 MED ORDER — HYDRALAZINE HCL 20 MG/ML IJ SOLN
INTRAMUSCULAR | Status: DC | PRN
  Administered 2020-01-18: 10 mg via INTRAVENOUS

## 2020-01-18 MED ORDER — MIDAZOLAM HCL 2 MG/2ML IJ SOLN: 1.0000 mg | INTRAMUSCULAR | Status: DC | PRN

## 2020-01-18 MED ORDER — METOPROLOL TARTRATE 5 MG/5ML IV SOLN
2.0000 mg | INTRAVENOUS | Status: AC | PRN
  Administered 2020-01-20 (×2): 5 mg via INTRAVENOUS
  Filled 2020-01-18 (×2): qty 5

## 2020-01-18 MED ORDER — FENTANYL CITRATE (PF) 100 MCG/2ML IJ SOLN
INTRAMUSCULAR | Status: DC | PRN
  Administered 2020-01-18: 25 ug via INTRAVENOUS

## 2020-01-18 MED ORDER — HEPARIN BOLUS VIA INFUSION
5000.0000 [IU] | Freq: Once | INTRAVENOUS | Status: AC
  Administered 2020-01-18: 5000 [IU] via INTRAVENOUS
  Filled 2020-01-18: qty 5000

## 2020-01-18 MED ORDER — SODIUM CHLORIDE 0.9% FLUSH: 3.0000 mL | INTRAVENOUS | Status: DC | PRN

## 2020-01-18 MED ORDER — POTASSIUM CHLORIDE CRYS ER 20 MEQ PO TBCR: 20.0000 meq | EXTENDED_RELEASE_TABLET | Freq: Once | ORAL | Status: DC

## 2020-01-18 MED ORDER — ORAL CARE MOUTH RINSE
15.0000 mL | Freq: Two times a day (BID) | OROMUCOSAL | Status: DC
  Administered 2020-01-18 – 2020-01-21 (×2): 15 mL via OROMUCOSAL

## 2020-01-18 MED ORDER — FENTANYL CITRATE (PF) 100 MCG/2ML IJ SOLN
INTRAMUSCULAR | Status: AC
  Filled 2020-01-18: qty 2

## 2020-01-18 MED ORDER — SODIUM CHLORIDE 0.9% FLUSH
3.0000 mL | Freq: Two times a day (BID) | INTRAVENOUS | Status: DC
  Administered 2020-01-18 – 2020-01-21 (×2): 3 mL via INTRAVENOUS

## 2020-01-18 MED ORDER — LABETALOL HCL 5 MG/ML IV SOLN: 10.0000 mg | INTRAVENOUS | Status: DC | PRN

## 2020-01-18 MED ORDER — HYDRALAZINE HCL 20 MG/ML IJ SOLN: 5.0000 mg | INTRAMUSCULAR | Status: DC | PRN

## 2020-01-18 MED ORDER — IODIXANOL 320 MG/ML IV SOLN
INTRAVENOUS | Status: DC | PRN
  Administered 2020-01-18: 45 mL

## 2020-01-18 MED ORDER — BISACODYL 10 MG RE SUPP: 10.0000 mg | Freq: Every day | RECTAL | Status: DC | PRN

## 2020-01-18 MED ORDER — FENTANYL CITRATE (PF) 100 MCG/2ML IJ SOLN
50.0000 ug | Freq: Once | INTRAMUSCULAR | Status: AC
  Administered 2020-01-18: 50 ug via INTRAVENOUS
  Filled 2020-01-18: qty 2

## 2020-01-18 MED ORDER — SODIUM CHLORIDE 0.9 % IV SOLN
1.0000 mg/h | INTRAVENOUS | Status: DC
  Administered 2020-01-18 – 2020-01-19 (×2): 1 mg/h
  Filled 2020-01-18 (×5): qty 10

## 2020-01-18 MED ORDER — HYDRALAZINE HCL 20 MG/ML IJ SOLN
INTRAMUSCULAR | Status: AC
  Filled 2020-01-18: qty 1

## 2020-01-18 MED ORDER — OXYCODONE-ACETAMINOPHEN 5-325 MG PO TABS
1.0000 | ORAL_TABLET | ORAL | Status: DC | PRN
  Administered 2020-01-18: 1 via ORAL
  Administered 2020-01-19: 2 via ORAL
  Administered 2020-01-19: 1 via ORAL
  Administered 2020-01-19 – 2020-01-20 (×3): 2 via ORAL
  Filled 2020-01-18: qty 2
  Filled 2020-01-18: qty 1
  Filled 2020-01-18: qty 2
  Filled 2020-01-18: qty 1
  Filled 2020-01-18 (×2): qty 2

## 2020-01-18 MED ORDER — HEPARIN (PORCINE) IN NACL 1000-0.9 UT/500ML-% IV SOLN
INTRAVENOUS | Status: DC | PRN
  Administered 2020-01-18 (×2): 500 mL

## 2020-01-18 MED ORDER — ONDANSETRON HCL 4 MG/2ML IJ SOLN
4.0000 mg | Freq: Four times a day (QID) | INTRAMUSCULAR | Status: DC | PRN
  Administered 2020-01-18 – 2020-01-19 (×2): 4 mg via INTRAVENOUS
  Filled 2020-01-18 (×2): qty 2

## 2020-01-18 MED ORDER — HEPARIN SODIUM (PORCINE) 1000 UNIT/ML IJ SOLN
INTRAMUSCULAR | Status: DC | PRN
  Administered 2020-01-18: 5000 [IU] via INTRAVENOUS

## 2020-01-18 MED ORDER — LIDOCAINE HCL (PF) 1 % IJ SOLN
INTRAMUSCULAR | Status: AC
  Filled 2020-01-18: qty 30

## 2020-01-18 MED ORDER — CHLORHEXIDINE GLUCONATE CLOTH 2 % EX PADS
6.0000 | MEDICATED_PAD | Freq: Every day | CUTANEOUS | Status: DC
  Administered 2020-01-18 – 2020-01-19 (×2): 6 via TOPICAL

## 2020-01-18 MED ORDER — SODIUM CHLORIDE 0.9 % IV SOLN: INTRAVENOUS | Status: DC

## 2020-01-18 MED ORDER — ONDANSETRON HCL 4 MG/2ML IJ SOLN: 4.0000 mg | Freq: Four times a day (QID) | INTRAMUSCULAR | Status: DC | PRN

## 2020-01-18 MED ORDER — MORPHINE SULFATE (PF) 4 MG/ML IV SOLN: 5.0000 mg | INTRAVENOUS | Status: DC | PRN

## 2020-01-18 MED ORDER — MIDAZOLAM HCL 2 MG/2ML IJ SOLN
INTRAMUSCULAR | Status: DC | PRN
  Administered 2020-01-18: 1 mg via INTRAVENOUS

## 2020-01-18 MED ORDER — LIDOCAINE HCL (PF) 1 % IJ SOLN
INTRAMUSCULAR | Status: DC | PRN
  Administered 2020-01-18: 15 mL via INTRADERMAL

## 2020-01-18 MED ORDER — HEPARIN (PORCINE) IN NACL 1000-0.9 UT/500ML-% IV SOLN
INTRAVENOUS | Status: AC
  Filled 2020-01-18: qty 1000

## 2020-01-18 MED ORDER — MIDAZOLAM HCL 2 MG/2ML IJ SOLN
INTRAMUSCULAR | Status: AC
  Filled 2020-01-18: qty 2

## 2020-01-18 MED ORDER — HEPARIN SODIUM (PORCINE) 1000 UNIT/ML IJ SOLN
INTRAMUSCULAR | Status: AC
  Filled 2020-01-18: qty 1

## 2020-01-18 MED ORDER — SODIUM CHLORIDE 0.9 % IV SOLN: 1.0000 mg/h | INTRAVENOUS | Status: DC

## 2020-01-18 MED ORDER — HEPARIN (PORCINE) 25000 UT/250ML-% IV SOLN
1000.0000 [IU]/h | INTRAVENOUS | Status: DC
  Administered 2020-01-18: 1000 [IU]/h via INTRAVENOUS
  Filled 2020-01-18: qty 250

## 2020-01-18 MED ORDER — SODIUM CHLORIDE 0.9 % IV SOLN: 250.0000 mL | INTRAVENOUS | Status: DC | PRN

## 2020-01-18 MED ORDER — MORPHINE SULFATE (PF) 2 MG/ML IV SOLN
2.0000 mg | INTRAVENOUS | Status: DC | PRN
  Administered 2020-01-18 – 2020-01-20 (×2): 2 mg via INTRAVENOUS
  Filled 2020-01-18 (×3): qty 1

## 2020-01-18 MED ORDER — HEPARIN (PORCINE) 25000 UT/250ML-% IV SOLN
1000.0000 [IU]/h | INTRAVENOUS | Status: DC
  Administered 2020-01-19: 800 [IU]/h via INTRAVENOUS
  Filled 2020-01-18: qty 250

## 2020-01-18 SURGICAL SUPPLY — 24 items
BAG SNAP BAND KOVER 36X36 (MISCELLANEOUS) ×4 IMPLANT
CATH BEACON 5 .035 65 KMP TIP (CATHETERS) ×2 IMPLANT
CATH CROSS OVER TEMPO 5F (CATHETERS) ×2 IMPLANT
CATH INFUS 135CMX50CM (CATHETERS) ×2 IMPLANT
CATH OMNI FLUSH 5F 65CM (CATHETERS) ×2 IMPLANT
CATH QUICKCROSS .035X135CM (MICROCATHETER) ×2 IMPLANT
CATH STRAIGHT 5FR 65CM (CATHETERS) ×2 IMPLANT
COVER DOME SNAP 22 D (MISCELLANEOUS) ×2 IMPLANT
DEVICE TORQUE .025-.038 (MISCELLANEOUS) ×2 IMPLANT
GLIDEWIRE ADV .035X180CM (WIRE) ×2 IMPLANT
KIT MICROPUNCTURE NIT STIFF (SHEATH) ×2 IMPLANT
KIT PV (KITS) ×2 IMPLANT
PROTECTION STATION PRESSURIZED (MISCELLANEOUS) ×2
SHEATH FLEX ANSEL ST 6FR 45CM (SHEATH) ×2 IMPLANT
SHEATH PINNACLE 5F 10CM (SHEATH) ×2 IMPLANT
SHEATH PROBE COVER 6X72 (BAG) ×2 IMPLANT
STATION PROTECTION PRESSURIZED (MISCELLANEOUS) ×1 IMPLANT
SYR MEDRAD MARK 7 150ML (SYRINGE) ×2 IMPLANT
TRANSDUCER W/STOPCOCK (MISCELLANEOUS) ×2 IMPLANT
TRAY PV CATH (CUSTOM PROCEDURE TRAY) ×2 IMPLANT
TUBE CONN 8.8X1320 FR HP M-F (CONNECTOR) ×2 IMPLANT
WIRE G V18X300CM (WIRE) ×2 IMPLANT
WIRE ROSEN-J .035X260CM (WIRE) ×2 IMPLANT
WIRE STARTER BENTSON 035X150 (WIRE) ×2 IMPLANT

## 2020-01-18 NOTE — Progress Notes (Signed)
01/18/2020 1520 Received pt to room 4E-04 from the ED.  Pt is A&O, rates pain in R foot a 2-3 on scale 1-10.  Tele monitor applied and CCMD notified.  CHG completed.  Oriented to room, call light and bed.  Call bell in reach. [place]

## 2020-01-18 NOTE — Progress Notes (Addendum)
ANTICOAGULATION CONSULT NOTE - Follow Up Consult  Pharmacy Consult for IV Heparin Indication: leg ischemia  Allergies  Allergen Reactions  . Oxycodone Nausea Only  . Statins Other (See Comments)    Muscle and Bone pain Muscle and Bone pain  . Donepezil Other (See Comments)    [facility]  . Imipramine Other (See Comments)    [facility]  . Oxybutynin Chloride Swelling and Other (See Comments)    [facility]    Patient Measurements: Height: 5\' 9"  (175.3 cm) Weight: 84.8 kg (187 lb) (per last visit) IBW/kg (Calculated) : 70.7 Heparin Dosing Weight: 84.8 kg  Vital Signs: Temp: 97.7 F (36.5 C) (07/28 1522) Temp Source: Oral (07/28 1522) BP: 155/65 (07/28 1522) Pulse Rate: 72 (07/28 1522)  Labs: Recent Labs    01/18/20 0827 01/18/20 0858 01/18/20 1511  HGB 13.6 13.9  --   HCT 42.1 41.0  --   PLT 184  --   --   APTT 23*  --   --   LABPROT 13.3  --   --   INR 1.1  --   --   HEPARINUNFRC  --   --  0.61  CREATININE 1.70* 1.70*  --     Estimated Creatinine Clearance: 35.2 mL/min (A) (by C-G formula based on SCr of 1.7 mg/dL (H)).   Medical History: Past Medical History:  Diagnosis Date  . Arthritis    "knees, elbows" (03/18/2018)  . Chronic edema    a. Chronic RLE edema.  Marland Kitchen COPD (chronic obstructive pulmonary disease) ([place])   . Coronary artery disease    a. MI s/p balloon 1996, details unclear.  Marland Kitchen Dysrhythmia    PROXIMAL MARGIN FIBRILATION  . GERD (gastroesophageal reflux disease)   . Hyperlipidemia   . Hypertension   . Myocardial infarction ([place]) 1996  . PAD (peripheral artery disease) ([place])    a. s/p stenting 08/2012, 02/2013.   Marland Kitchen PAF (paroxysmal atrial fibrillation) ([place])   . Sleep apnea    "have mask; have to start wearing it" (03/18/2018)  . Stroke [facility]) ~ 2014   denies residual on 03/18/2018  . Thrombosis of lower extremity    a. Listed on patient's medical bracelet - at [place].  Marland Kitchen Type II diabetes mellitus [facility])     Assessment: 79 yr old male with extensive hx of  PVD (with multiple stents that have clotted off) presented with right leg pain. Also has hx of atria fibrillation. Pharmacy is consulted to dose IV heparin for leg ischemia.  Patient states he last took apixaban on 7/23 and Plavix on 7/20 as instructed, prior to colonoscopy on 7/26. Per colonoscopy note, patient was instructed to continue holding after polypectomy for an additional week (per [facility] note, pt was off apixaban and Plavix for ~6 days).  Per discussion with MD, okay to bolus and treat per nomogram. Of note, patient received lower than anticipated dosing of heparin during his admission in March.   Initial heparin level ~6 hrs after heparin 5000 units IV bolus X 1, followed by heparin infusion at 1000 units/hr, was 0.61 units/ml, which is within the goal range for this pt. CBC stable. Per RN, no issues with IV or bleeding observed.  Per [facility] note, planning for arteriography and lysis initiation later today. Per RN, she was instructed just now (~1600 PM) to turn heparin infusion off for surgery.  Goal of Therapy:  Heparin level 0.3-0.7 units/ml Monitor platelets by anticoagulation protocol: Yes   Plan:  F/U anticoagulation plans  after procedure this afternoon Monitor daily heparin level, CBC Monitor for signs/symptoms of bleeding  Gillermina Hu, PharmD, BCPS, [facility] Clinical Pharmacist 01/18/2020 4:02 PM  .

## 2020-01-18 NOTE — Progress Notes (Signed)
ANTICOAGULATION CONSULT NOTE - Initial Consult  Pharmacy Consult for heparin Indication: leg ischemia  Allergies  Allergen Reactions  . Oxycodone Nausea Only  . Statins Other (See Comments)    Muscle and Bone pain Muscle and Bone pain    Patient Measurements: Height: 5\' 9"  (175.3 cm) Weight: 84.8 kg (187 lb) (per last visit) IBW/kg (Calculated) : 70.7 Heparin Dosing Weight: 84.8 kg  Vital Signs: Temp: 98.1 F (36.7 C) (07/28 0554) Temp Source: Oral (07/28 0554) BP: 153/64 (07/28 0554) Pulse Rate: 57 (07/28 0554)  Labs: No results for input(s): HGB, HCT, PLT, APTT, LABPROT, INR, HEPARINUNFRC, HEPRLOWMOCWT, CREATININE, CKTOTAL, CKMB, TROPONINIHS in the last 72 hours.  CrCl cannot be calculated (Patient's most recent lab result is older than the maximum 21 days allowed.).   Medical History: Past Medical History:  Diagnosis Date  . Arthritis    "knees, elbows" (03/18/2018)  . Chronic edema    a. Chronic RLE edema.  Marland Kitchen COPD (chronic obstructive pulmonary disease) ([place])   . Coronary artery disease    a. MI s/p balloon 1996, details unclear.  Marland Kitchen Dysrhythmia    PROXIMAL MARGIN FIBRILATION  . GERD (gastroesophageal reflux disease)   . Hyperlipidemia   . Hypertension   . Myocardial infarction ([place]) 1996  . PAD (peripheral artery disease) ([place])    a. s/p stenting 08/2012, 02/2013.   Marland Kitchen PAF (paroxysmal atrial fibrillation) ([place])   . Sleep apnea    "have mask; have to start wearing it" (03/18/2018)  . Stroke [facility]) ~ 2014   denies residual on 03/18/2018  . Thrombosis of lower extremity    a. Listed on patient's medical bracelet - at [place].  Marland Kitchen Type II diabetes mellitus [facility])     Assessment: 79 y.o. male presents with right leg pain. He has an extensive history of peripheral vascular disease, multiple prior stents that have clotted off.   Patient states he last took Eliquis on 7/23 and Plavix on 7/20 as instructed prior to colonoscopy on 7/26. Per colonoscopy note, patient was  instructed to continue holding after polypectomy for an additional week.   Per discussion with MD, okay to bolus and treat per nomogram. Of note, patient received lower than anticipated of heparin during his admission in March.   Goal of Therapy:  Heparin level 0.3-0.7 units/ml Monitor platelets by anticoagulation protocol: Yes   Plan:  Give 5000 units bolus x 1 Start heparin infusion at 1000 units/hr Check anti-Xa level in 6 hours and daily while on heparin Continue to monitor H&H and platelets   Rebbeca Paul, PharmD PGY1 Pharmacy Resident 01/18/2020 9:01 AM  Please check [email or web address] for unit-specific pharmacy phone numbers.

## 2020-01-18 NOTE — Progress Notes (Signed)
[place] for heparin Indication: leg ischemia  Allergies  Allergen Reactions  . Oxycodone Nausea Only  . Statins Other (See Comments)    Muscle and Bone pain Muscle and Bone pain  . Donepezil Other (See Comments)    [facility]  . Imipramine Other (See Comments)    [facility]  . Oxybutynin Chloride Swelling and Other (See Comments)    [facility]    Patient Measurements: Height: 5\' 9"  (175.3 cm) Weight: 84.8 kg (187 lb) (per last visit) IBW/kg (Calculated) : 70.7 Heparin Dosing Weight: 84.8 kg  Vital Signs: Temp: 97.4 F (36.3 C) (07/28 1851) Temp Source: Oral (07/28 1522) BP: 162/48 (07/28 1852) Pulse Rate: 79 (07/28 1852)  Labs: Recent Labs    01/18/20 0827 01/18/20 0858 01/18/20 1511  HGB 13.6 13.9  --   HCT 42.1 41.0  --   PLT 184  --   --   APTT 23*  --   --   LABPROT 13.3  --   --   INR 1.1  --   --   HEPARINUNFRC  --   --  0.61  CREATININE 1.70* 1.70*  --     Estimated Creatinine Clearance: 35.2 mL/min (A) (by C-G formula based on SCr of 1.7 mg/dL (H)).   Medical History: Past Medical History:  Diagnosis Date  . Arthritis    "knees, elbows" (03/18/2018)  . Chronic edema    a. Chronic RLE edema.  Marland Kitchen COPD (chronic obstructive pulmonary disease) ([place])   . Coronary artery disease    a. MI s/p balloon 1996, details unclear.  Marland Kitchen Dysrhythmia    PROXIMAL MARGIN FIBRILATION  . GERD (gastroesophageal reflux disease)   . Hyperlipidemia   . Hypertension   . Myocardial infarction ([place]) 1996  . PAD (peripheral artery disease) ([place])    a. s/p stenting 08/2012, 02/2013.   Marland Kitchen PAF (paroxysmal atrial fibrillation) ([place])   . Sleep apnea    "have mask; have to start wearing it" (03/18/2018)  . Stroke [facility]) ~ 2014   denies residual on 03/18/2018  . Thrombosis of lower extremity    a. Listed on patient's medical bracelet - at [place].  Marland Kitchen Type II diabetes mellitus (HCC)     Assessment: 55 yom with extensive history of peripheral vascular  disease, multiple prior stents that have clotted off presenting with R leg pain.   Patient states he last took Eliquis on 7/23 and Plavix on 7/20 as instructed prior to colonoscopy on 7/26. Per colonoscopy note, instructed to continue holding after polypectomy for additional week.   Patient started on heparin 7/28 AM. Now s/p R leg arteriogram 7/28 PM. Catheter-directed thrombolysis initiated with alteplase at 1mg /hr and heparin running at 800 units/hr per Dr. Carlis Abbott with plan to come back 7/29 for thrombolytics catheter check.  Per discussion with Dr. Donnetta Hutching (Vascular on-call) - ok to adjust heparin overnight to maintain therapeutic goal. RN aware. CBC wnl, noted renal dysfunction. No active bleed issues reported.  Goal of Therapy:  Heparin level 0.2-0.5 units/ml Monitor platelets by anticoagulation protocol: Yes   Plan:  Continue heparin at 800 units/hr for now Alteplase IK 1mg /hr per Vascular Check 8hr heparin level and adjust as needed Monitor daily heparin level and CBC, s/sx bleeding   Arturo Morton, PharmD, BCPS Please check AMION for all [place] contact numbers Clinical Pharmacist 01/18/2020 7:01 PM

## 2020-01-18 NOTE — ED Notes (Signed)
Dr Early at bedside.

## 2020-01-18 NOTE — Consult Note (Addendum)
Hospital Consult    Reason for Consult:  Painful RLE Requesting Physician:  Dr. Roslynn Amble MRN #:  [id number]  History of Present Illness: This is a 79 y.o. male with an approximately 20-hour history of right lower extremity pain.  He states the pain began feeling similar to a "toothache" in his right foot and progressed to his ankle.  This is associated with dysesthesia and mild decreased motor function.  He states there were no alleviating factors.  He recently underwent repeat colonoscopy due to history of recent colon polyps and was off his Eliquis and Plavix for approximately 6 days.  Prior to this, he denied lower extremity pain on exercise or rest pain.  The patient is seen and evaluated in the emergency department where he is awake, alert and in no apparent distress.  He denies chest pain but endorses chronic shortness of breath secondary to COPD.  His past medical history is significant for right femoral to popliteal artery bypass grafting with ipsilateral vein harvest.  He also had previous iliac stenting remotely as well as redo of his bypass.  He has history of subsequent occlusion of this bypass 01/09/2018 and Dr. Bridgett Larsson performed thrombectomy as well as fasciotomies.  On July 25, 2019 he presented with critical right lower extremity ischemia with rest pain and underwent aortogram with stenting of his right TP trunk, popliteal artery and superficial femoral artery.  He again developed a occlusion of his right SFA, popliteal and TP trunk stents on August 31, 2019. He underwent thrombolytic catheter placement and was returned to the [facility] lab the following day For penumbra mechanical thrombectomy, balloon angioplasty of the right peroneal artery and stent placement of the TP trunk and proximal SFA.  The patient was last seen in our office on October 07, 2019.  His right ABI was 0.95 with a TBI of 0.58.  No tissue compromise of his right lower extremity.  PMHx significant for atrial fibrillation,  CAD s/p AMI. History of back surgery, anterior approach.  The pt is on a Zetia for cholesterol management. (intolerant of statins) The pt not on a daily aspirin.   Other AC:  Eliquis, Plavix The pt is on amiodarone, BB, ACEI for hypertension.   The pt is not diabetic.   Tobacco hx:  Quit 1990s  Past Medical History:  Diagnosis Date  . Arthritis    "knees, elbows" (03/18/2018)  . Chronic edema    a. Chronic RLE edema.  Marland Kitchen COPD (chronic obstructive pulmonary disease) ([place])   . Coronary artery disease    a. MI s/p balloon 1996, details unclear.  Marland Kitchen Dysrhythmia    PROXIMAL MARGIN FIBRILATION  . GERD (gastroesophageal reflux disease)   . Hyperlipidemia   . Hypertension   . Myocardial infarction ([place]) 1996  . PAD (peripheral artery disease) ([place])    a. s/p stenting 08/2012, 02/2013.   Marland Kitchen PAF (paroxysmal atrial fibrillation) ([place])   . Sleep apnea    "have mask; have to start wearing it" (03/18/2018)  . Stroke [facility]) ~ 2014   denies residual on 03/18/2018  . Thrombosis of lower extremity    a. Listed on patient's medical bracelet - at [place].  Marland Kitchen Type II diabetes mellitus ([place])     Past Surgical History:  Procedure Laterality Date  . ABDOMINAL AORTOGRAM W/LOWER EXTREMITY N/A 07/25/2019   Procedure: ABDOMINAL AORTOGRAM W/LOWER EXTREMITY;  Surgeon: Waynetta Sandy, MD;  Location: [place] [facility];  Service: Cardiovascular;  Laterality: N/A;  . ANTERIOR LUMBAR FUSION  [place]    . APPLICATION OF WOUND VAC Right 01/09/2018   Procedure: APPLICATION OF WOUND VAC ON RIGHT LOWER LEG;  Surgeon: Conrad [place], MD;  Location: [place];  Service: Vascular;  Laterality: Right;  . BACK SURGERY    . CATARACT EXTRACTION W/ INTRAOCULAR LENS  IMPLANT, BILATERAL Bilateral   . CORONARY ANGIOPLASTY WITH STENT PLACEMENT     "I've got 1 stent in there" (03/18/2018)  . FASCIOTOMY Right 01/09/2018   Procedure: FASCIOTOMY RIGHT LOWER LEG;  Surgeon: Conrad Calcium, MD;  Location: [place];  Service:  Vascular;  Laterality: Right;  . FASCIOTOMY CLOSURE Right 01/11/2018   Procedure: FASCIOTOMY CLOSURE RIGHT CALF;  Surgeon: Conrad [place], MD;  Location: [place];  Service: Vascular;  Laterality: Right;  . FEMORAL-POPLITEAL BYPASS GRAFT Right 01/09/2018   Procedure: RIGHT FEMORAL-POPLITEAL ARTERY BYPASS USING PROPATEN 6MM X 80CM VASCULAR GRAFT;  Surgeon: Conrad Cathcart, MD;  Location: [place];  Service: Vascular;  Laterality: Right;  . HEMORRHOID SURGERY    . LEFT HEART CATH AND CORONARY ANGIOGRAPHY N/A 03/19/2018   Procedure: LEFT HEART CATH AND CORONARY ANGIOGRAPHY;  Surgeon: Martinique, Peter M, MD;  Location: [place] [facility];  Service: Cardiovascular;  Laterality: N/A;  . LOWER EXTREMITY ANGIOGRAPHY N/A 08/31/2019   Procedure: LOWER EXTREMITY ANGIOGRAPHY;  Surgeon: [name], MD;  Location: [place] [facility];  Service: Cardiovascular;  Laterality: N/A;  . PATCH ANGIOPLASTY Right 01/09/2018   Procedure: [place] BIOLOGIC 1CM X 6CM PATCH;  Surgeon: Conrad [place], MD;  Location: [place];  Service: Vascular;  Laterality: Right;  . PERIPHERAL VASCULAR BALLOON ANGIOPLASTY Right 09/01/2019   Procedure: PERIPHERAL VASCULAR BALLOON ANGIOPLASTY;  Surgeon: Waynetta Sandy, MD;  Location: [place] [facility];  Service: Cardiovascular;  Laterality: Right;  peroneal artery.  Marland Kitchen PERIPHERAL VASCULAR INTERVENTION Right 07/25/2019   Procedure: PERIPHERAL VASCULAR INTERVENTION;  Surgeon: Waynetta Sandy, MD;  Location: [place] [facility];  Service: Cardiovascular;  Laterality: Right;  SFA X 3  . PERIPHERAL VASCULAR INTERVENTION Right 09/01/2019   Procedure: PERIPHERAL VASCULAR INTERVENTION;  Surgeon: Waynetta Sandy, MD;  Location: [place] [facility];  Service: Cardiovascular;  Laterality: Right;  superficial femoral  . PERIPHERAL VASCULAR THROMBECTOMY Right 09/01/2019   Procedure: PERIPHERAL VASCULAR THROMBECTOMY;  Surgeon: Waynetta Sandy, MD;  Location:  [place] [facility];  Service: Cardiovascular;  Laterality: Right;  . POSTERIOR LUMBAR FUSION  1978  . THROMBECTOMY FEMORAL ARTERY Right 01/09/2018   Procedure: THROMBECTOMY RIGHT LOWER LEG;  Surgeon: Conrad , MD;  Location: [place];  Service: Vascular;  Laterality: Right;  . TONSILLECTOMY      Allergies  Allergen Reactions  . Oxycodone Nausea Only  . Statins Other (See Comments)    Muscle and Bone pain Muscle and Bone pain  . Donepezil Other (See Comments)    [facility]  . Imipramine Other (See Comments)    [facility]  . Oxybutynin Chloride Swelling and Other (See Comments)    [facility]    Prior to Admission medications   Medication Sig Start Date End Date Taking? Authorizing Provider  apixaban (ELIQUIS) 5 MG TABS tablet Take 5 mg by mouth 2 (two) times daily.   Yes [provider]  clopidogrel (PLAVIX) 75 MG tablet Take 1 tablet (75 mg total) by mouth daily. 01/14/18  Yes Dagoberto Ligas, PA-C  acetaminophen (TYLENOL) 500 MG tablet Take 500 mg by mouth 2 (two) times daily  as needed (for pain or headaches).     [provider]  amiodarone (PACERONE) 400 MG tablet Take 1 tablet (400 mg total) by mouth 2 (two) times daily. 03/19/18   Almyra Deforest, PA  Cholecalciferol (VITAMIN D3 PO) Take 1 tablet by mouth daily.    [provider]  Cyanocobalamin (VITAMIN B-12 PO) Take 1 tablet by mouth daily.    [provider]  ezetimibe (ZETIA) 10 MG tablet Take 10 mg by mouth every evening.     [provider]  Ferrous Sulfate (IRON PO) Take 1 tablet by mouth daily.    [provider]  HYDROcodone-acetaminophen (NORCO) 5-325 MG tablet Take 1 tablet by mouth every 6 (six) hours as needed for moderate pain. 09/02/19   Ulyses Amor, PA-C  memantine (NAMENDA) 10 MG tablet Take 10 mg by mouth 2 (two) times daily.    [provider]  metoprolol (LOPRESSOR) 50 MG tablet Take 50 mg by mouth 2 (two) times daily.    [provider]  nitroGLYCERIN  (NITROSTAT) 0.4 MG SL tablet Place 0.4 mg under the tongue every 5 (five) minutes as needed for chest pain.    [provider]  pantoprazole (PROTONIX) 40 MG tablet Take 40 mg by mouth daily.    [provider]  ramipril (ALTACE) 10 MG capsule Take 10 mg by mouth 2 (two) times daily.    [provider]  rivastigmine (EXELON) 6 MG capsule Take 6 mg by mouth 2 (two) times daily.    [provider]    Social History   Socioeconomic History  . Marital status: Legally Separated    Spouse name: Not on file  . Number of children: Not on file  . Years of education: Not on file  . Highest education level: Not on file  Occupational History  . Not on file  Tobacco Use  . Smoking status: Former Smoker    Packs/day: 3.00    Years: 30.00    Pack years: 90.00    Quit date: 1996    Years since quitting: 25.5  . Smokeless tobacco: Never Used  Vaping Use  . Vaping Use: Never used  Substance and Sexual Activity  . Alcohol use: No    Comment: Remote alcohol use  . Drug use: Never  . Sexual activity: Not Currently  Other Topics Concern  . Not on file  Social History Narrative  . Not on file   Social Determinants of Health   Financial Resource Strain:   . Difficulty of Paying Living Expenses:   Food Insecurity:   . Worried About Charity fundraiser in the Last Year:   . Arboriculturist in the Last Year:   Transportation Needs:   . Film/video editor (Medical):   Marland Kitchen Lack of Transportation (Non-Medical):   Physical Activity:   . Days of Exercise per Week:   . Minutes of Exercise per Session:   Stress:   . Feeling of Stress :   Social Connections:   . Frequency of Communication with Friends and Family:   . Frequency of Social Gatherings with Friends and Family:   . Attends Religious Services:   . Active Member of Clubs or Organizations:   . Attends Archivist Meetings:   Marland Kitchen Marital Status:   Intimate Partner Violence:   . Fear of  Current or Ex-Partner:   . Emotionally Abused:   Marland Kitchen Physically Abused:   . Sexually Abused:  Family History  Problem Relation Age of Onset  . CAD Mother   . CAD Brother   . Hypertension Other     ROS: [x]  Positive   [ ]  Negative   [ ]  All sytems reviewed and are negative  Cardiac: []  chest pain/pressure []  palpitations []  SOB lying flat []  DOE  Vascular: []  pain in legs while walking [x]  pain in legs at rest []  pain in legs at night []  non-healing ulcers []  hx of DVT []  swelling in legs  Pulmonary: []  productive cough []  asthma/wheezing []  home O2  Neurologic: [x]  weakness in []  arms [x]  legs [x]  numbness in []  arms [x]  legs []  hx of CVA []  mini stroke [] difficulty speaking or slurred speech []  temporary loss of vision in one eye []  dizziness  Hematologic: []  hx of cancer []  bleeding problems []  problems with blood clotting easily  Endocrine:   []  diabetes []  thyroid disease  GI []  vomiting blood []  blood in stool  GU: []  CKD/renal failure []  HD--[]  M/W/F or []  T/T/S []  burning with urination []  blood in urine  Psychiatric: []  anxiety []  depression  Musculoskeletal: []  arthritis []  joint pain  Integumentary: []  rashes []  ulcers  Constitutional: []  fever []  chills   Physical Examination  Vitals:   01/18/20 0554 01/18/20 0930  BP: (!) 153/64   Pulse: 57   Resp: 18   Temp: 98.1 F (36.7 C) (!) 97.5 F (36.4 C)  SpO2: 96%    Body mass index is 27.62 kg/m.  General:  WDWN in NAD Gait: Not observed HENT: WNL, normocephalic Pulmonary: normal non-labored breathing, without Rales, rhonchi,  wheezing Cardiac: regular, Abdomen:  soft, NT/ND, no masses Skin: without rashes Vascular Exam/Pulses:  Right Left  Radial 2+ (normal) 2+ (normal)      Brachial 2+ (normal) 2+ (normal)  Femoral 2+ (normal) 2+ (normal)      DP absent 2+ (normal)  PT absent   Peroneal absent    Extremities: with acute ischemic changes: Right foot,  ankle to distal third of lower leg cool to touch., without Gangrene , without cellulitis; without open wounds; he is able to weakly dorsiflex and plantarflex.  Musculoskeletal: no muscle wasting or atrophy  Neurologic: A&O X 3;  No focal weakness or paresthesias are detected; speech is fluent/normal Psychiatric:  The pt has Normal affect.   CBC    Component Value Date/Time   WBC 6.9 01/18/2020 0827   RBC 4.26 01/18/2020 0827   HGB 13.9 01/18/2020 0858   HCT 41.0 01/18/2020 0858   PLT 184 01/18/2020 0827   MCV 98.8 01/18/2020 0827   MCH 31.9 01/18/2020 0827   MCHC 32.3 01/18/2020 0827   RDW 13.6 01/18/2020 0827   LYMPHSABS 1.4 01/18/2020 0827   MONOABS 1.4 (H) 01/18/2020 0827   EOSABS 0.1 01/18/2020 0827   BASOSABS 0.0 01/18/2020 0827    BMET    Component Value Date/Time   NA 138 01/18/2020 0858   K 4.4 01/18/2020 0858   CL 104 01/18/2020 0858   CO2 23 01/18/2020 0827   GLUCOSE 97 01/18/2020 0858   BUN 25 (H) 01/18/2020 0858   CREATININE 1.70 (H) 01/18/2020 0858   CALCIUM 9.2 01/18/2020 0827   GFRNONAA 38 (L) 01/18/2020 0827   GFRAA 43 (L) 01/18/2020 0827    COAGS: Lab Results  Component Value Date   INR 1.1 01/18/2020   INR 1.3 (H) 08/30/2019   INR 1.2 07/23/2019     Non-Invasive Vascular Imaging:  April 2021 ABI/TBI Today's ABI Today's TBI Previous ABI Previous TBI Right 0.95 0.58 0.41 0.10 Left 0.94 0.63 0.91 0.49  Right TP: 103 Left TP; 112  ASSESSMENT/PLAN: This is a 79 y.o. male presents with approximately 20-hour history of right lower extremity pain associated with dysesthesia and decreased motor function of his right foot and ankle.  Unable to obtain Doppler pulses.  Past medical history is significant for revascularization of his lower extremity with multiple stents.  Recently off his Eliquis and Plavix for colonoscopy.  Vital signs are stable.  Heparin drip initiated.  Current creatinine is 1.7 up from 1.19 on September 02, 2019.  -Discussed with Dr.  Donnetta Hutching.  He has reviewed his previous arteriograms.  It is unlikely he will have surgical options to reestablish arterial flow to his right lower extremity.  Most likely proceed with thrombectomy/thrombolysis percutaneously.  Continue IV heparin.  Will hydrate with normal saline.  Transthoracic echocardiogram performed January 10, 2018  LV ejection fraction estimated at 60 to 65% with normal wall motion and preserved systolic function.   Risa Grill, PA-C [facility] [phone]  I have examined the patient, reviewed and agree with above.  Recurrent occlusion of right femoral to below-knee popliteal bypass.  Had lysis and stenting at the proximal and distal anastomosis in March 2021.  Presents now with 24 hours of obvious ischemia.  Reports severe pain associated with this.  This has improved with hydration.  He does have motor and sensory function intact in his foot and his calf is not tender to compression.  I had a long discussion with the patient.  I explained that I clearly as at risk for limb loss.  Explained that with each intervention, revascularization is more difficult.  Feel that his best option is for repeat endovascular treatment with lysis.  Discussed this also with Dr. Carlis Abbott who will proceed with arteriography and lysis initiation later today.  Curt Jews, MD 01/18/2020 11:12 AM

## 2020-01-18 NOTE — ED Provider Notes (Signed)
[facility] EMERGENCY DEPARTMENT Provider Note   CSN: [id number] Arrival date & time: 01/17/20  2133     History Chief Complaint  Patient presents with  . Leg Pain    Donald Gordon is a 79 y.o. male.  Presents to ER with concern for right leg pain.  Extensive history of peripheral vascular disease, multiple prior stents that have clotted off.  Patient recently had EGD/colonoscopy.  Anticoagulation and antiplatelet agent were held at the time.  Due to polypectomy, additionally recommended to hold for next few days as well.  Yesterday afternoon patient started to develop leg pain, normally has occasional leg pain but this seems to be more constant, steadily worsening.  Leg pain currently 4-5 out of 10 in severity sharp, stabbing, radiates from upper leg to lower leg.  He did not notice any color changes prior to coming to ER. Stated he went to    On September 01, 2019 Dr. Gwenlyn Saran took the patient to the operating room where he underwent penumbra mechanical thrombectomy of his right TP trunk, peroneal artery and posterior tibial artery.  A stent was placed in his TP trunk as well as 1 in the proximal SFA.  Patient was sent home on Plavix and Eliquis.  Recent EGD/colonoscopy  EGD Findings:  - Esophagus. Z line is regular at 35 cm. No esophagitis or stricture observed - Gastric. Mild GAVE and treated with APC - Duodenum (bulb-D2). Normal  __________________________________________________________________________ - A rectal examination was then performed. The colonoscope was inserted into the rectum and advanced under direct vision to the cecum, which was identified by the ileocecal valve and appendiceal orifice. The quality of the colonic preparation was excellent. A careful inspection was made as the colonoscope was withdrawn, including a retroflexed view of the rectum; findings and interventions are described below. Appropriate photodocumentation was obtained.  Colonoscopy  Findings:  - BBPS score 6 - Total withdraw time 70 minutes - Cecum. 3 cm LST (granular and non-mixed type). Lift with submucosal injection with Eleview and piecemeal resected with cold snare. Hemostatic clips placed to prevent post polypectomy bleeding - Proximal ascending colon. 1 cm flat polyp resected with cold snare. One hemostatic clip placed to prevent bleeding - Proximal descending colon. 4 cm. LST (NG and no depression). Lift with submucosal injection with Eleview. Piecemeal resected with hot snare. Scant polypoid tissue removed with hot forceps. Edge of polypectomy site treated with APC and tip of hot snare. Severe hemostatic clips were placed to prevent bleeding - Proximal sigmoid colon. 2 cm LST (NG and no depression). Lift with submucosal injection with Eleview. Resected with hot snare by piecemeal method. Hemostatic clips were placed to prevent post polypectomy bleeding - Sigmoid diverticulosis   HPI     Past Medical History:  Diagnosis Date  . Arthritis    "knees, elbows" (03/18/2018)  . Chronic edema    a. Chronic RLE edema.  Marland Kitchen COPD (chronic obstructive pulmonary disease) ([place])   . Coronary artery disease    a. MI s/p balloon 1996, details unclear.  Marland Kitchen Dysrhythmia    PROXIMAL MARGIN FIBRILATION  . GERD (gastroesophageal reflux disease)   . Hyperlipidemia   . Hypertension   . Myocardial infarction ([place]) 1996  . PAD (peripheral artery disease) ([place])    a. s/p stenting 08/2012, 02/2013.   Marland Kitchen PAF (paroxysmal atrial fibrillation) ([place])   . Sleep apnea    "have mask; have to start wearing it" (03/18/2018)  . Stroke [facility]) ~ 2014  denies residual on 03/18/2018  . Thrombosis of lower extremity    a. Listed on patient's medical bracelet - at [place].  Marland Kitchen Type II diabetes mellitus [facility])     Patient Active Problem List   Diagnosis Date Noted  . Critical lower limb ischemia 08/31/2019  . PAD (peripheral artery disease) ([place]) 07/23/2019  . Ischemia of right lower extremity  01/09/2018  . Critical ischemia of foot 01/09/2018  . Atrial fibrillation ([place]) 07/04/2014  . Atrial fibrillation with rapid ventricular response ([place])   . Leukocytosis 07/02/2014  . Paroxysmal atrial fibrillation - with RVR 07/02/2014  . Elevated troponin 07/02/2014  . Renal insufficiency 07/02/2014  . Essential hypertension 07/02/2014  . Hypotension 07/02/2014  . CAD (coronary artery disease) 07/02/2014  . Cough 07/02/2014  . Hyperlipemia 07/02/2014  . Atrial fibrillation with RVR ([place]) 07/02/2014  . SIRS (systemic inflammatory response syndrome) ([place]) 07/02/2014  . Diabetes mellitus type 2, controlled ([place]) 07/02/2014    Past Surgical History:  Procedure Laterality Date  . ABDOMINAL AORTOGRAM W/LOWER EXTREMITY N/A 07/25/2019   Procedure: ABDOMINAL AORTOGRAM W/LOWER EXTREMITY;  Surgeon: Waynetta Sandy, MD;  Location: [place] CV [facility];  Service: Cardiovascular;  Laterality: N/A;  . ANTERIOR LUMBAR FUSION  1981  . APPENDECTOMY    . APPLICATION OF WOUND VAC Right 01/09/2018   Procedure: APPLICATION OF WOUND VAC ON RIGHT LOWER LEG;  Surgeon: Conrad Piedmont, MD;  Location: [place];  Service: Vascular;  Laterality: Right;  . BACK SURGERY    . CATARACT EXTRACTION W/ INTRAOCULAR LENS  IMPLANT, BILATERAL Bilateral   . CORONARY ANGIOPLASTY WITH STENT PLACEMENT     "I've got 1 stent in there" (03/18/2018)  . FASCIOTOMY Right 01/09/2018   Procedure: FASCIOTOMY RIGHT LOWER LEG;  Surgeon: Conrad Hoisington, MD;  Location: [place];  Service: Vascular;  Laterality: Right;  . FASCIOTOMY CLOSURE Right 01/11/2018   Procedure: FASCIOTOMY CLOSURE RIGHT CALF;  Surgeon: Conrad [place], MD;  Location: [place];  Service: Vascular;  Laterality: Right;  . FEMORAL-POPLITEAL BYPASS GRAFT Right 01/09/2018   Procedure: RIGHT FEMORAL-POPLITEAL ARTERY BYPASS USING PROPATEN 6MM X 80CM VASCULAR GRAFT;  Surgeon: Conrad [place], MD;  Location: [place];  Service: Vascular;  Laterality: Right;  . HEMORRHOID SURGERY    . LEFT  HEART CATH AND CORONARY ANGIOGRAPHY N/A 03/19/2018   Procedure: LEFT HEART CATH AND CORONARY ANGIOGRAPHY;  Surgeon: Martinique, Peter M, MD;  Location: [place] [facility];  Service: Cardiovascular;  Laterality: N/A;  . LOWER EXTREMITY ANGIOGRAPHY N/A 08/31/2019   Procedure: LOWER EXTREMITY ANGIOGRAPHY;  Surgeon: [name], MD;  Location: [place] CV [facility];  Service: Cardiovascular;  Laterality: N/A;  . PATCH ANGIOPLASTY Right 01/09/2018   Procedure: [place] BIOLOGIC 1CM X 6CM PATCH;  Surgeon: Conrad Iny[place]ern, MD;  Location: [place];  Service: Vascular;  Laterality: Right;  . PERIPHERAL VASCULAR BALLOON ANGIOPLASTY Right 09/01/2019   Procedure: PERIPHERAL VASCULAR BALLOON ANGIOPLASTY;  Surgeon: Waynetta Sandy, MD;  Location: [place] [facility];  Service: Cardiovascular;  Laterality: Right;  peroneal artery.  Marland Kitchen PERIPHERAL VASCULAR INTERVENTION Right 07/25/2019   Procedure: PERIPHERAL VASCULAR INTERVENTION;  Surgeon: Waynetta Sandy, MD;  Location: [place] CV [facility];  Service: Cardiovascular;  Laterality: Right;  SFA X 3  . PERIPHERAL VASCULAR INTERVENTION Right 09/01/2019   Procedure: PERIPHERAL VASCULAR INTERVENTION;  Surgeon: Waynetta Sandy, MD;  Location: [place] [facility];  Service: Cardiovascular;  Laterality: Right;  superficial femoral  . PERIPHERAL VASCULAR  THROMBECTOMY Right 09/01/2019   Procedure: PERIPHERAL VASCULAR THROMBECTOMY;  Surgeon: Waynetta Sandy, MD;  Location: [place] [facility];  Service: Cardiovascular;  Laterality: Right;  . POSTERIOR LUMBAR FUSION  1978  . THROMBECTOMY FEMORAL ARTERY Right 01/09/2018   Procedure: THROMBECTOMY RIGHT LOWER LEG;  Surgeon: Conrad Waikele, MD;  Location: [place];  Service: Vascular;  Laterality: Right;  . TONSILLECTOMY         Family History  Problem Relation Age of Onset  . CAD Mother   . CAD Brother   . Hypertension Other     Social History   Tobacco Use  . Smoking  status: Former Smoker    Packs/day: 3.00    Years: 30.00    Pack years: 90.00    Quit date: 1996    Years since quitting: 25.5  . Smokeless tobacco: Never Used  Vaping Use  . Vaping Use: Never used  Substance Use Topics  . Alcohol use: No    Comment: Remote alcohol use  . Drug use: Never    Home Medications Prior to Admission medications   Medication Sig Start Date End Date Taking? Authorizing Provider  acetaminophen (TYLENOL) 500 MG tablet Take 500 mg by mouth 2 (two) times daily as needed (for pain or headaches).    Yes [provider]  apixaban (ELIQUIS) 5 MG TABS tablet Take 5 mg by mouth 2 (two) times daily.   Yes [provider]  clopidogrel (PLAVIX) 75 MG tablet Take 1 tablet (75 mg total) by mouth daily. 01/14/18  Yes Dagoberto Ligas, PA-C  amiodarone (PACERONE) 400 MG tablet Take 1 tablet (400 mg total) by mouth 2 (two) times daily. 03/19/18   Almyra Deforest, PA  Cholecalciferol (VITAMIN D3 PO) Take 1 tablet by mouth daily.    [provider]  Cyanocobalamin (VITAMIN B-12 PO) Take 1 tablet by mouth daily.    [provider]  ezetimibe (ZETIA) 10 MG tablet Take 10 mg by mouth every evening.     [provider]  Ferrous Sulfate (IRON PO) Take 1 tablet by mouth daily.    [provider]  HYDROcodone-acetaminophen (NORCO) 5-325 MG tablet Take 1 tablet by mouth every 6 (six) hours as needed for moderate pain. 09/02/19   Ulyses Amor, PA-C  memantine (NAMENDA) 10 MG tablet Take 10 mg by mouth 2 (two) times daily.    [provider]  metoprolol (LOPRESSOR) 50 MG tablet Take 50 mg by mouth 2 (two) times daily.    [provider]  nitroGLYCERIN (NITROSTAT) 0.4 MG SL tablet Place 0.4 mg under the tongue every 5 (five) minutes as needed for chest pain.    [provider]  pantoprazole (PROTONIX) 40 MG tablet Take 40 mg by mouth daily.    [provider]  ramipril (ALTACE) 10 MG capsule Take 10 mg by  mouth 2 (two) times daily.    [provider]  rivastigmine (EXELON) 6 MG capsule Take 6 mg by mouth 2 (two) times daily.    [provider]    Allergies    Oxycodone, Statins, Donepezil, Imipramine, and Oxybutynin chloride  Review of Systems   Review of Systems  Physical Exam Updated Vital Signs BP (!) 153/64 (BP Location: Right Arm)   Pulse 57   Temp (!) 97.5 F (36.4 C) (Oral)   Resp 18   Ht 5' 9"  (1.753 m)   Wt 84.8 kg Comment: per last visit  SpO2 96%   BMI 27.62 kg/m  Physical Exam Vitals and nursing note reviewed.  Constitutional:      Appearance: He is well-developed.  HENT:     Head: Normocephalic and atraumatic.  Eyes:     Conjunctiva/sclera: Conjunctivae normal.  Cardiovascular:     Rate and Rhythm: Normal rate and regular rhythm.     Heart sounds: No murmur heard.   Pulmonary:     Effort: Pulmonary effort is normal. No respiratory distress.     Breath sounds: Normal breath sounds.  Abdominal:     Palpations: Abdomen is soft.     Tenderness: There is no abdominal tenderness.  Musculoskeletal:     Cervical back: Neck supple.     Comments: LLE: warm, dry, dp/pt pulses intact RLE: distal extremity is cool to touch, distal sensation and motor intact; unable to doppler DP/PT pulses, palpable femoral pulse  Skin:    Coloration: Skin is not jaundiced or pale.  Neurological:     Mental Status: He is alert.     ED Results / Procedures / Treatments   Labs (all labs ordered are listed, but only abnormal results are displayed) Labs Reviewed  CBC WITH DIFFERENTIAL/PLATELET - Abnormal; Notable for the following components:      Result Value   Monocytes Absolute 1.4 (*)    All other components within normal limits  APTT - Abnormal; Notable for the following components:   aPTT 23 (*)    All other components within normal limits  BASIC METABOLIC PANEL - Abnormal; Notable for the following components:   Glucose, Bld 101 (*)    Creatinine,  Ser 1.70 (*)    GFR calc non Af Amer 38 (*)    GFR calc Af Amer 43 (*)    All other components within normal limits  I-STAT CHEM 8, ED - Abnormal; Notable for the following components:   BUN 25 (*)    Creatinine, Ser 1.70 (*)    Calcium, Ion 1.06 (*)    All other components within normal limits  SARS CORONAVIRUS 2 BY RT PCR (HOSPITAL ORDER, [place] LAB)  PROTIME-INR  HEPARIN LEVEL (UNFRACTIONATED)    EKG None  Radiology No results found.  Procedures .Critical Care Performed by: Lucrezia Starch, MD Authorized by: Lucrezia Starch, MD   Critical care provider statement:    Critical care time (minutes):  45   Critical care was time spent personally by me on the following activities:  Discussions with consultants, evaluation of patient's response to treatment, examination of patient, ordering and performing treatments and interventions, ordering and review of laboratory studies, ordering and review of radiographic studies, pulse oximetry, re-evaluation of patient's condition, obtaining history from patient or surrogate and review of old charts   (including critical care time)  Medications Ordered in ED Medications  heparin ADULT infusion 100 units/mL (25000 units/238m sodium chloride 0.45%) (1,000 Units/hr Intravenous New Bag/Given 01/18/20 0904)  fentaNYL (SUBLIMAZE) injection 50 mcg (50 mcg Intravenous Given 01/18/20 0858)  heparin bolus via infusion 5,000 Units (5,000 Units Intravenous Bolus from Bag 01/18/20 0908)    ED Course  I have reviewed the triage vital signs and the nursing notes.  Pertinent labs & imaging results that were available during my care of the patient were reviewed by me and considered in my medical decision making (see chart for details).  Clinical Course as of Jan 17 957  Wed Jan 18, 2020  0828 Completed assessment, unable to doppler distal pulses, concern for limb ischemia, will page vascular,  GI, c/s pharm for heparin    [RD]  0840 D/w Early with Vascular, relayed concern for acute limb ischemia, someone will be down shortly   [RD]  0850 Reviewed with [place] with GI - no absolute contraindications, okay to proceed with [facility], agrees with risk/benefit analysis   [RD]  [id number] Reviewed with pharmacy, starting heparin   [RD]  4175 Vascular at bedside, cancel CTA, they will take directly to OR   [RD]    Clinical Course User Index [RD] Lucrezia Starch, MD   MDM Rules/Calculators/A&P                          79 year old male with extensive history of peripheral vascular disease, multiple prior stents presented to ER with right leg pain.  Anticoagulation antiplatelet recently held for GI procedure.  On patient arrival in ER room, promptly evaluated patient, unable to palpate pulses, unable to Doppler pulse, extremity cool to touch.  Consulted vascular surgery who came to bedside.  Will take to the OR for further management. Do not need CTA. Additionally discussed case with gastroenterology, reviewed recent colonoscopy and endoscopy, noted polypectomy, okay to proceed with anticoagulation given the significant benefit for management of his critical limb ischemia.  Pain well controlled, motor and sensation currently intact.   Final Clinical Impression(s) / ED Diagnoses Final diagnoses:  Limb ischemia    Rx / DC Orders ED Discharge Orders    None       Lucrezia Starch, MD 01/18/20 [id number]

## 2020-01-18 NOTE — Op Note (Signed)
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  Ultrasound-guided access of the left common femoral artery 2.  Aortogram including catheter selection of aorta 3.  Right lower extremity arteriogram with selection of third order branches 4.  Placement of thrombolytic's catheter in the right lower extremity bypass with initiation of catheter directed thrombolysis 5.  44 minutes of monitored moderate conscious sedation time  Indications: 79 year old male who presented to the ED this morning with acute right leg pain that started yesterday and found to have acute on chronic limb ischemia.  He presents today for planned right leg arteriogram and possible intervention and we will plan for thrombolysis.  He is high risk for limb loss and has recently undergone lysis for bypass salvage in March.    Findings:   Aortogram showed a patent infrarenal aorta as well as on the right a patent common and external iliac artery with no flow limiting stenosis (althgouh diffusely disease) and patent right external iliac stent.  The right common femoral as well as the profunda are patent even though the profunda has a high-grade distal stenosis.  The right common femoral to below-knee popliteal bypass is occluded including all the stents that were previously placed to realign the bypass.  The [place] stent in the TP trunk is also occluded.  He has acute thrombus in the TP trunk distal to the stent.  He does reconstitute a posterior tibial artery distally as dominant runoff.  A lytics catheter was placed down the right leg bypass into the TP trunk and lysis initiated.    Procedure:  The patient was identified in the holding area and taken to room 8.  The  patient was then placed supine on the table and prepped and draped in the usual sterile fashion.  A time out was called.  Ultrasound was used to evaluate the left common femoral artery.  It was patent .  A digital ultrasound image was acquired.  A micropuncture needle was used to access the left common femoral artery under ultrasound guidance.  An 018 wire was advanced without resistance and a micropuncture sheath was placed.  The 018 wire was removed and a benson wire was placed.  The micropuncture sheath was exchanged for a 5 french sheath.  An omniflush catheter was advanced over the wire to the level of L-1.  An abdominal angiogram was obtained.  Next attempted to cross the aortic bifurcation with a Omni Flush catheter but had to exchange for a crossover catheter with a Glidewire advantage and ultimately got a wire down into the right common femoral.  Tried to cannulate the proximal bypass initially with a straight catheter and Glidewire advantage and exchanged for an angled KMP catheter and ultimately was able to cannulate the bypass.  That point in time 6 [place] Ansell sheath was placed in the left groin over the aortic bifurcation.  Patient was given 5000 units of IV heparin to ensure he was therapeutic and had been started on heparin drip in the ED.  I then got a wire all the way down into the tibioperoneal trunk and got a long quick cross catheter down distal into the TP trunk and shot hand-injection that showed the TP trunk stent was occluded as well as other acute thrombus in  the TP trunk but there was PT runoff distally.  That point in time placed a long 50 cm infusion length UniFuse catheter in the right lower extremity bypass down to the TP trunk.  He will be initiated on thrombolysis and taken to the ICU.  Plan: Patient will be initiated on TPA at 1 mg/hr through the UniFuse catheter in the right lower extremity bypass with heparin at 800 units/hr through the sheath and come back tomorrow for  thrombolytics catheter check.   [name], MD [facility] Office: [phone]

## 2020-01-18 NOTE — Care Management (Signed)
[facility] called to state that this patient is a Member of [address]. Shirleen Schirmer is his  provider social worker is Cyndy Pelham [phone] ext [phone]

## 2020-01-19 ENCOUNTER — Encounter (HOSPITAL_COMMUNITY): Payer: Self-pay | Admitting: Vascular Surgery

## 2020-01-19 ENCOUNTER — Encounter (HOSPITAL_COMMUNITY)
Admission: EM | Disposition: A | Discharge status: Home / Self Care | Payer: Self-pay | Source: Home / Self Care | Attending: Vascular Surgery

## 2020-01-19 HISTORY — PX: PERIPHERAL VASCULAR ATHERECTOMY: CATH118256

## 2020-01-19 HISTORY — PX: PERIPHERAL VASCULAR THROMBECTOMY: CATH118306

## 2020-01-19 LAB — CBC
HCT: 34.2 % — ABNORMAL LOW (ref 39.0–52.0)
HCT: 33.7 % — ABNORMAL LOW (ref 39.0–52.0)
HCT: 35.6 % — ABNORMAL LOW (ref 39.0–52.0)
Hemoglobin: 11.3 g/dL — ABNORMAL LOW (ref 13.0–17.0)
Hemoglobin: 10.9 g/dL — ABNORMAL LOW (ref 13.0–17.0)
Hemoglobin: 11.5 g/dL — ABNORMAL LOW (ref 13.0–17.0)
MCH: 32 pg (ref 26.0–34.0)
MCH: 32 pg (ref 26.0–34.0)
MCH: 31.9 pg (ref 26.0–34.0)
MCHC: 33 g/dL (ref 30.0–36.0)
MCHC: 32.3 g/dL (ref 30.0–36.0)
MCHC: 32.3 g/dL (ref 30.0–36.0)
MCV: 96.9 fL (ref 80.0–100.0)
MCV: 98.8 fL (ref 80.0–100.0)
MCV: 98.9 fL (ref 80.0–100.0)
Platelets: 156 10*3/uL (ref 150–400)
Platelets: 145 10*3/uL — ABNORMAL LOW (ref 150–400)
Platelets: 153 10*3/uL (ref 150–400)
RBC: 3.53 MIL/uL — ABNORMAL LOW (ref 4.22–5.81)
RBC: 3.41 MIL/uL — ABNORMAL LOW (ref 4.22–5.81)
RBC: 3.6 MIL/uL — ABNORMAL LOW (ref 4.22–5.81)
RDW: 13.7 % (ref 11.5–15.5)
RDW: 13.8 % (ref 11.5–15.5)
RDW: 13.8 % (ref 11.5–15.5)
WBC: 6.8 10*3/uL (ref 4.0–10.5)
WBC: 6.3 10*3/uL (ref 4.0–10.5)
WBC: 6.7 10*3/uL (ref 4.0–10.5)
nRBC: 0 % (ref 0.0–0.2)
nRBC: 0 % (ref 0.0–0.2)
nRBC: 0 % (ref 0.0–0.2)

## 2020-01-19 LAB — BASIC METABOLIC PANEL
Anion gap: 10 (ref 5–15)
BUN: 19 mg/dL (ref 8–23)
CO2: 21 mmol/L — ABNORMAL LOW (ref 22–32)
Calcium: 7.9 mg/dL — ABNORMAL LOW (ref 8.9–10.3)
Chloride: 108 mmol/L (ref 98–111)
Creatinine, Ser: 1.47 mg/dL — ABNORMAL HIGH (ref 0.61–1.24)
GFR calc Af Amer: 52 mL/min — ABNORMAL LOW (ref >60)
GFR calc non Af Amer: 45 mL/min — ABNORMAL LOW (ref >60)
Glucose, Bld: 93 mg/dL (ref 70–99)
Potassium: 4.8 mmol/L (ref 3.5–5.1)
Sodium: 139 mmol/L (ref 135–145)

## 2020-01-19 LAB — HEPARIN LEVEL (UNFRACTIONATED)
Heparin Unfractionated: 0.26 IU/mL — ABNORMAL LOW (ref 0.30–0.70)
Heparin Unfractionated: 0.19 IU/mL — ABNORMAL LOW (ref 0.30–0.70)
Heparin Unfractionated: 0.36 IU/mL (ref 0.30–0.70)

## 2020-01-19 LAB — MRSA PCR SCREENING: MRSA by PCR: NEGATIVE

## 2020-01-19 LAB — FIBRINOGEN
Fibrinogen: 346 mg/dL (ref 210–475)
Fibrinogen: 363 mg/dL (ref 210–475)

## 2020-01-19 LAB — POCT ACTIVATED CLOTTING TIME: Activated Clotting Time: 164 seconds

## 2020-01-19 SURGERY — PERIPHERAL VASCULAR THROMBECTOMY
Anesthesia: LOCAL | Laterality: Right

## 2020-01-19 MED ORDER — HEPARIN SODIUM (PORCINE) 1000 UNIT/ML IJ SOLN
INTRAMUSCULAR | Status: DC | PRN
  Administered 2020-01-19: 4000 [IU] via INTRAVENOUS

## 2020-01-19 MED ORDER — CLOPIDOGREL BISULFATE 75 MG PO TABS: 75.0000 mg | ORAL_TABLET | Freq: Every day | ORAL | Status: DC

## 2020-01-19 MED ORDER — HEPARIN (PORCINE) IN NACL 1000-0.9 UT/500ML-% IV SOLN
INTRAVENOUS | Status: AC
  Filled 2020-01-19: qty 500

## 2020-01-19 MED ORDER — HEPARIN (PORCINE) IN NACL 1000-0.9 UT/500ML-% IV SOLN
INTRAVENOUS | Status: DC | PRN
  Administered 2020-01-19: 500 mL

## 2020-01-19 MED ORDER — ASPIRIN EC 81 MG PO TBEC
81.0000 mg | DELAYED_RELEASE_TABLET | Freq: Every day | ORAL | Status: DC
  Administered 2020-01-20 – 2020-01-21 (×2): 81 mg via ORAL
  Filled 2020-01-19 (×2): qty 1

## 2020-01-19 MED ORDER — ONDANSETRON HCL 4 MG/2ML IJ SOLN
4.0000 mg | Freq: Four times a day (QID) | INTRAMUSCULAR | Status: DC | PRN
  Administered 2020-01-19: 4 mg via INTRAVENOUS
  Filled 2020-01-19: qty 2

## 2020-01-19 MED ORDER — SODIUM CHLORIDE 0.9% FLUSH: 3.0000 mL | INTRAVENOUS | Status: DC | PRN

## 2020-01-19 MED ORDER — SODIUM CHLORIDE 0.9 % IV SOLN: INTRAVENOUS | Status: AC

## 2020-01-19 MED ORDER — SODIUM CHLORIDE 0.9 % IV SOLN: 250.0000 mL | INTRAVENOUS | Status: DC | PRN

## 2020-01-19 MED ORDER — CLOPIDOGREL BISULFATE 75 MG PO TABS
300.0000 mg | ORAL_TABLET | Freq: Once | ORAL | Status: AC
  Administered 2020-01-19: 300 mg via ORAL
  Filled 2020-01-19: qty 4

## 2020-01-19 MED ORDER — CLOPIDOGREL BISULFATE 75 MG PO TABS
75.0000 mg | ORAL_TABLET | Freq: Every day | ORAL | Status: DC
  Administered 2020-01-20: 75 mg via ORAL
  Filled 2020-01-19: qty 1

## 2020-01-19 MED ORDER — LABETALOL HCL 5 MG/ML IV SOLN: 10.0000 mg | INTRAVENOUS | Status: DC | PRN

## 2020-01-19 MED ORDER — ACETAMINOPHEN 325 MG PO TABS: 650.0000 mg | ORAL_TABLET | ORAL | Status: DC | PRN

## 2020-01-19 MED ORDER — HYDRALAZINE HCL 20 MG/ML IJ SOLN: 5.0000 mg | INTRAMUSCULAR | Status: DC | PRN

## 2020-01-19 MED ORDER — LIDOCAINE HCL (PF) 1 % IJ SOLN
INTRAMUSCULAR | Status: AC
  Filled 2020-01-19: qty 30

## 2020-01-19 MED ORDER — HEPARIN (PORCINE) 25000 UT/250ML-% IV SOLN
1050.0000 [IU]/h | INTRAVENOUS | Status: DC
  Administered 2020-01-20: 1000 [IU]/h via INTRAVENOUS
  Administered 2020-01-21: 1050 [IU]/h via INTRAVENOUS
  Filled 2020-01-19 (×2): qty 250

## 2020-01-19 MED ORDER — HEPARIN SODIUM (PORCINE) 1000 UNIT/ML IJ SOLN
INTRAMUSCULAR | Status: AC
  Filled 2020-01-19: qty 1

## 2020-01-19 MED ORDER — IODIXANOL 320 MG/ML IV SOLN
INTRAVENOUS | Status: DC | PRN
  Administered 2020-01-19: 65 mL via INTRA_ARTERIAL

## 2020-01-19 MED ORDER — SODIUM CHLORIDE 0.9% FLUSH
3.0000 mL | Freq: Two times a day (BID) | INTRAVENOUS | Status: DC
  Administered 2020-01-21: 3 mL via INTRAVENOUS

## 2020-01-19 SURGICAL SUPPLY — 15 items
BAG SNAP BAND KOVER 36X36 (MISCELLANEOUS) ×3 IMPLANT
BALLN JADE .018 4.0 X 80 (BALLOONS) ×3
BALLN JADE .018 5.0 X 60 (BALLOONS) ×3
BALLOON JADE .018 4.0 X 80 (BALLOONS) ×2 IMPLANT
BALLOON JADE .018 5.0 X 60 (BALLOONS) ×2 IMPLANT
CATH AURYON 6FR ATHEREC 2.0 (CATHETERS) ×6 IMPLANT
COVER DOME SNAP 22 D (MISCELLANEOUS) ×3 IMPLANT
DEVICE CLOSURE MYNXGRIP 6/7F (Vascular Products) ×3 IMPLANT
KIT ENCORE 26 ADVANTAGE (KITS) ×3 IMPLANT
PROTECTION STATION PRESSURIZED (MISCELLANEOUS) ×3
SHEATH PINNACLE 6F 10CM (SHEATH) ×3 IMPLANT
STATION PROTECTION PRESSURIZED (MISCELLANEOUS) ×2 IMPLANT
TRAY PV CATH (CUSTOM PROCEDURE TRAY) ×3 IMPLANT
WIRE G V18X300CM (WIRE) ×3 IMPLANT
WIRE SPARTACORE .014X300CM (WIRE) ×6 IMPLANT

## 2020-01-19 NOTE — Progress Notes (Signed)
Heparin infusing at 1000U/hr on arrival to holding area.

## 2020-01-19 NOTE — Progress Notes (Signed)
[facility]  Subjective  - states he can still move his right foot.   Objective (!) 144/50 57 98.3 F (36.8 C) (Oral) (!) 11 96%  Intake/Output Summary (Last 24 hours) at 01/19/2020 0835 Last data filed at 01/19/2020 0700 Gross per 24 hour  Intake 2757.59 ml  Output 753 ml  Net 2004.59 ml    Right foot motor and sensory intact No signals in right foot Left groin sheath c/d/i  Laboratory Lab Results: Recent Labs    01/19/20 0152 01/19/20 0547  WBC 6.8 6.3  HGB 11.3* 10.9*  HCT 34.2* 33.7*  PLT 156 145*   BMET Recent Labs    01/18/20 0827 01/18/20 0827 01/18/20 0858 01/19/20 0547  NA 137   < > 138 139  K 4.5   < > 4.4 4.8  CL 104   < > 104 108  CO2 23  --   --  21*  GLUCOSE 101*   < > 97 93  BUN 22   < > 25* 19  CREATININE 1.70*   < > 1.70* 1.47*  CALCIUM 9.2  --   --  7.9*   < > = values in this interval not displayed.    COAG Lab Results  Component Value Date   INR 1.1 01/18/2020   INR 1.3 (H) 08/30/2019   INR 1.2 07/23/2019   No results found for: PTT  Assessment/Planning:  79 year old male with occluded right femoropopliteal bypass that has undergone multiple previous thrombolytics for bypass salvage.  Admitted yesterday from the ED by Dr. Donnetta Hutching with acute on chronic limb ischemia of the right leg.  He has undergone thrombolysis overnight.  No signals in right foot but motor and sensory intact.  Plan thrombolytics checked this morning.  Risks and benefits discussed.  [name] 01/19/2020 8:35 AM --

## 2020-01-19 NOTE — Op Note (Signed)
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  Thrombolytic's catheter check of the right lower extremity bypass 2.  Right lower extremity arteriogram including selection of third order branches 3.  Right proximal SFA laser atherectomy (2.0 mm Auryon laser) 4.  Right below-knee popliteal and tibioperoneal trunk laser arthrectomy (2.0 mm Auryon laser) 5.  Right proximal SFA angioplasty at the proximal extent of the stent (5 mm x 60 mm Jade) 6.  Right below-knee popliteal artery and tibioperoneal trunk angioplasty at distal extent of the stent (4 mm x 80 mm Jade) 7.  Mynx closure of the left common femoral artery  Indications: 79 year old male who has undergone multiple bypasses in the right lower extremity most recently with a common femoral to below-knee popliteal prosthetic bypass that occluded and was realigned with a Viabahn by Dr. [name].  This then reoccluded a second time and has had multiple lytics to salvage the bypass with proximal stent into SFA/common femoral junction and distal stents into TP trunk.  He presented to the ED yesterday with acute on chronic limb ischemia and his bypass was thrombosed.  He presents today for lytics check.  Findings:   After overnight lysis the right femoral to below-knee popliteal bypass is now patent and there is flow down the right lower extremity into the PT as single-vessel tibial runoff.  There appeared to be some residual thrombus and stenosis in the proximal SFA at the proximal portion of the stent and also some residual thrombus and stenosis in the below-knee popliteal tibioperoneal trunk stent.  Both these areas were treated with laser arthrectomy at 10 MHz and 60 MHz and then  angioplastied accordingly as noted above.  He now has inline flow down the right lower extremity with a brisk PT signal.  He does have some common femoral disease on the right proximal to his bypass but did not feel this was amendable to endovascular treatment.   Procedure:  The patient was identified in the holding area and taken to room 8.  The patient was then placed supine on the table and prepped and draped in the usual sterile fashion.  A time out was called.  Initially removed the inner cannula from the lytics catheter down the right lower extremity bypass.  We then placed a [place] core 014 wire down the right peroneal and the lytics catheter subsequently removed.  I then performed hand injections of the right lower extremity bypass through the sheath and this now showed the bypass is patent but as noted above there was residual thrombus in the proximal SFA at the proximal portion of the stent with some residual stenosis as well as residual thrombus in the below-knee popliteal and tibioperoneal trunk and with some stenosis.  That point in time elected to perform laser arthrectomy of both these lesions otherwise did not see any other defects with preserved runoff through the posterior tibial which has been his runoff in the past.  That point in time laser arthrectomy with a 2.0 mm Auryon laser was initially performed in the proximal right SFA stent across the ostium at the common femoral artery junction at 50 MHz and 60 MHz with laser and then went down distal and treated the below-knee popliteal artery tibioperoneal trunk within the existing stents again  with Auryon laser at 50 MHz and 60 MHz.  The proximal SFA stent was angioplastied with a 5 mm Jade to nominal pressure for 2 minutes and the below knee popliteal artery and tibioperoneal trunk stent was ballooned with a 4 mm Jade balloon to nominal pressure for 2 minutes.  He now has inline flow down the right leg bypass with preserved runoff in the right  lower extremity through the posterior tibial.  He has a brisk posterior signal in the right foot.  Wires and catheters were removed and we did a short 6 French sheath in the left groin.  A groin access shot was obtained and a mynx closure was placed in the left groin.   Plan: Patient will resume heparin immediately in the Cath Lab and appreciate pharmacy for adjustments.   [name], MD [facility] [place] Office: [phone]

## 2020-01-19 NOTE — Progress Notes (Signed)
[facility] for heparin Indication: leg ischemia  Allergies  Allergen Reactions  . Oxycodone Nausea Only  . Statins Other (See Comments)    Muscle and Bone pain Muscle and Bone pain  . Donepezil Other (See Comments)    [facility]  . Imipramine Other (See Comments)    [facility]  . Oxybutynin Chloride Swelling and Other (See Comments)    [facility]    Patient Measurements: Height: 5\' 9"  (175.3 cm) (84.8kg) Weight: 84.8 kg (187 lb) (per last visit) IBW/kg (Calculated) : 70.7 Heparin Dosing Weight: 84.8 kg  Vital Signs: Temp: 97.8 F (36.6 C) (07/29 2020) Temp Source: Oral (07/29 2020) BP: 121/49 (07/29 2020) Pulse Rate: 72 (07/29 2020)  Labs: Recent Labs    01/18/20 0827 01/18/20 0827 01/18/20 0858 01/18/20 1511 01/19/20 0152 01/19/20 0152 01/19/20 0547 01/19/20 0819 01/19/20 2124  HGB 13.6   < > 13.9  --  11.3*   < > 10.9* 11.5*  --   HCT 42.1   < > 41.0  --  34.2*  --  33.7* 35.6*  --   PLT 184   < >  --   --  156  --  145* 153  --   APTT 23*  --   --   --   --   --   --   --   --   LABPROT 13.3  --   --   --   --   --   --   --   --   INR 1.1  --   --   --   --   --   --   --   --   HEPARINUNFRC  --   --   --    < > 0.26*  --   --  0.19* 0.36  CREATININE 1.70*  --  1.70*  --   --   --  1.47*  --   --    < > = values in this interval not displayed.    Estimated Creatinine Clearance: 40.7 mL/min (A) (by C-G formula based on SCr of 1.47 mg/dL (H)).   Assessment: 24 yom with extensive history of peripheral vascular disease, multiple prior stents that have clotted off presenting with R leg pain.   Patient states he last took Eliquis on 7/23 and Plavix on 7/20 as instructed prior to colonoscopy on 7/26. Per colonoscopy note, instructed to continue holding after polypectomy for additional week.   Patient started on heparin 7/28 AM. Now s/p R leg arteriogram 7/28 PM and catheter-directed thrombolysis.  S/p arthrectomy/angioplasty on  01/19/20 and IV heparin was resumed at 1000 units/hr per Vascular.  Alteplase currently off.  Heparin level is therapeutic; no bleeding per RN.  Goal of Therapy:  Heparin level 0.2-0.5 units/ml Monitor platelets by anticoagulation protocol: Yes   Plan:  Continue heparin at 1000 units/hr  Monitor daily heparin level and CBC, s/sx bleeding  Malikhi Ogan D. Mina Marble, PharmD, BCPS, [place] 01/19/2020, 9:54 PM

## 2020-01-19 NOTE — Progress Notes (Signed)
79 year old male now status post percutaneous revascularization of occluded right femoropopliteal bypass today with lysis as well as laser arthrectomy and angioplasty.  He has a brisk right PT signal.  Right calf soft.  Would reload him on Plavix at 300 and start regular Plavix tomorrow at 48.  Continue heparin tonight and dosed per pharmacy.  [name], MD [facility] [place] Office: [place]

## 2020-01-19 NOTE — Progress Notes (Signed)
[facility] for heparin Indication: leg ischemia  Allergies  Allergen Reactions  . Oxycodone Nausea Only  . Statins Other (See Comments)    Muscle and Bone pain Muscle and Bone pain  . Donepezil Other (See Comments)    [facility]  . Imipramine Other (See Comments)    [facility]  . Oxybutynin Chloride Swelling and Other (See Comments)    [facility]    Patient Measurements: Height: 5\' 9"  (175.3 cm) Weight: 84.8 kg (187 lb) (per last visit) IBW/kg (Calculated) : 70.7 Heparin Dosing Weight: 84.8 kg  Vital Signs: Temp: 98.5 F (36.9 C) (07/29 0012) Temp Source: Oral (07/29 0012) BP: 154/53 (07/29 0300) Pulse Rate: 69 (07/29 0300)  Labs: Recent Labs    01/18/20 0827 01/18/20 0827 01/18/20 0858 01/18/20 1511 01/19/20 0152  HGB 13.6   < > 13.9  --  11.3*  HCT 42.1  --  41.0  --  34.2*  PLT 184  --   --   --  156  APTT 23*  --   --   --   --   LABPROT 13.3  --   --   --   --   INR 1.1  --   --   --   --   HEPARINUNFRC  --   --   --  0.61 0.26*  CREATININE 1.70*  --  1.70*  --   --    < > = values in this interval not displayed.    Estimated Creatinine Clearance: 35.2 mL/min (A) (by C-G formula based on SCr of 1.7 mg/dL (H)).   Medical History: Past Medical History:  Diagnosis Date  . Arthritis    "knees, elbows" (03/18/2018)  . Chronic edema    a. Chronic RLE edema.  Marland Kitchen COPD (chronic obstructive pulmonary disease) ([place])   . Coronary artery disease    a. MI s/p balloon 1996, details unclear.  Marland Kitchen Dysrhythmia    PROXIMAL MARGIN FIBRILATION  . GERD (gastroesophageal reflux disease)   . Hyperlipidemia   . Hypertension   . Myocardial infarction ([place]) 1996  . PAD (peripheral artery disease) ([place])    a. s/p stenting 08/2012, 02/2013.   Marland Kitchen PAF (paroxysmal atrial fibrillation) ([place])   . Sleep apnea    "have mask; have to start wearing it" (03/18/2018)  . Stroke [facility]) ~ 2014   denies residual on 03/18/2018  . Thrombosis of lower extremity    a.  Listed on patient's medical bracelet - at [place].  Marland Kitchen Type II diabetes mellitus (HCC)     Assessment: 16 yom with extensive history of peripheral vascular disease, multiple prior stents that have clotted off presenting with R leg pain.   Patient states he last took Eliquis on 7/23 and Plavix on 7/20 as instructed prior to colonoscopy on 7/26. Per colonoscopy note, instructed to continue holding after polypectomy for additional week.   Patient started on heparin 7/28 AM. Now s/p R leg arteriogram 7/28 PM. Catheter-directed thrombolysis initiated with alteplase at 1mg /hr and heparin running at 800 units/hr per Dr. Carlis Abbott with plan to come back 7/29 for thrombolytics catheter check.  Per discussion with Dr. Donnetta Hutching (Vascular on-call) - ok to adjust heparin overnight to maintain therapeutic goal. RN aware. CBC wnl, noted renal dysfunction. No active bleed issues reported.  7/29 AM update:  Heparin level therapeutic   Goal of Therapy:  Heparin level 0.2-0.5 units/ml Monitor platelets by anticoagulation protocol: Yes   Plan:  Cont heparin  at 800 units/hr  q6h heparin levels while on alteplase  Monitor daily heparin level and CBC, s/sx bleeding  Narda Bonds, PharmD, BCPS Clinical Pharmacist Phone: [phone]

## 2020-01-20 ENCOUNTER — Encounter (HOSPITAL_COMMUNITY): Payer: Self-pay | Admitting: Vascular Surgery

## 2020-01-20 DIAGNOSIS — I739 Peripheral vascular disease, unspecified: Secondary | ICD-10-CM

## 2020-01-20 DIAGNOSIS — E785 Hyperlipidemia, unspecified: Secondary | ICD-10-CM

## 2020-01-20 DIAGNOSIS — I480 Paroxysmal atrial fibrillation: Secondary | ICD-10-CM

## 2020-01-20 LAB — CBC
HCT: 32.3 % — ABNORMAL LOW (ref 39.0–52.0)
Hemoglobin: 10.4 g/dL — ABNORMAL LOW (ref 13.0–17.0)
MCH: 31.6 pg (ref 26.0–34.0)
MCHC: 32.2 g/dL (ref 30.0–36.0)
MCV: 98.2 fL (ref 80.0–100.0)
Platelets: 164 10*3/uL (ref 150–400)
RBC: 3.29 MIL/uL — ABNORMAL LOW (ref 4.22–5.81)
RDW: 13.8 % (ref 11.5–15.5)
WBC: 6.2 10*3/uL (ref 4.0–10.5)
nRBC: 0 % (ref 0.0–0.2)

## 2020-01-20 LAB — HEPARIN LEVEL (UNFRACTIONATED): Heparin Unfractionated: 0.21 IU/mL — ABNORMAL LOW (ref 0.30–0.70)

## 2020-01-20 MED ORDER — MEMANTINE HCL 10 MG PO TABS
5.0000 mg | ORAL_TABLET | Freq: Every morning | ORAL | Status: DC
  Administered 2020-01-21: 5 mg via ORAL
  Filled 2020-01-20: qty 1

## 2020-01-20 MED ORDER — METOPROLOL TARTRATE 50 MG PO TABS
50.0000 mg | ORAL_TABLET | Freq: Two times a day (BID) | ORAL | Status: DC
  Administered 2020-01-20 – 2020-01-21 (×3): 50 mg via ORAL
  Filled 2020-01-20 (×3): qty 1

## 2020-01-20 MED ORDER — AMIODARONE HCL 200 MG PO TABS
100.0000 mg | ORAL_TABLET | Freq: Two times a day (BID) | ORAL | Status: DC
  Administered 2020-01-21: 100 mg via ORAL
  Filled 2020-01-20: qty 1

## 2020-01-20 MED ORDER — NITROGLYCERIN 0.4 MG SL SUBL: 0.4000 mg | SUBLINGUAL_TABLET | SUBLINGUAL | Status: DC | PRN

## 2020-01-20 MED ORDER — AMIODARONE HCL 200 MG PO TABS
400.0000 mg | ORAL_TABLET | Freq: Two times a day (BID) | ORAL | Status: DC
  Administered 2020-01-20: 400 mg via ORAL
  Filled 2020-01-20: qty 2

## 2020-01-20 MED ORDER — CLOPIDOGREL BISULFATE 75 MG PO TABS: 75.0000 mg | ORAL_TABLET | Freq: Every day | ORAL | Status: DC

## 2020-01-20 MED ORDER — EZETIMIBE 10 MG PO TABS
10.0000 mg | ORAL_TABLET | Freq: Every evening | ORAL | Status: DC
  Administered 2020-01-20: 10 mg via ORAL
  Filled 2020-01-20: qty 1

## 2020-01-20 MED ORDER — RAMIPRIL 10 MG PO CAPS
10.0000 mg | ORAL_CAPSULE | Freq: Two times a day (BID) | ORAL | Status: DC
  Administered 2020-01-20 – 2020-01-21 (×3): 10 mg via ORAL
  Filled 2020-01-20 (×3): qty 1

## 2020-01-20 NOTE — Progress Notes (Addendum)
Around 0240 pt flipped into what appeared to be afib RVR, rates up to 160s. Gave PRN 5mg  IV metoprolol, then notified Clark MD who gave verbal order for additional 5mg  IV metoprolol. EKG obtained- very frequent PACs w/ rates teetering between 80-120s. Pt lying in bed w/ no complaints other than incisional pain. VSS BP 140/77 HR 104 after medication administration. Will continue to monitor.  Jaymes Graff, RN

## 2020-01-20 NOTE — Progress Notes (Addendum)
[facility] [place]  Subjective  - No new complaints right leg feels better. He still feels a little weak in the right LE and has not walked.     Objective (!) 130/47 (!) 117 97.7 F (36.5 C) (Oral) 18 92%  Intake/Output Summary (Last 24 hours) at 01/20/2020 0933 Last data filed at 01/20/2020 0854 Gross per 24 hour  Intake 309.33 ml  Output 750 ml  Net -440.67 ml    Doppler brisk PT right LE Left groin soft without hematoma Heart irregularly irregular in A fib with history of known A fib Lungs non labored breathing  Assessment/Planning: POD # 1 Procedure Performed: 1.  Thrombolytic's catheter check of the right lower extremity bypass 2.  Right lower extremity arteriogram including selection of third order branches 3.  Right proximal SFA laser atherectomy (2.0 mm Auryon laser) 4.  Right below-knee popliteal and tibioperoneal trunk laser arthrectomy (2.0 mm Auryon laser) 5.  Right proximal SFA angioplasty at the proximal extent of the stent (5 mm x 60 mm Jade) 6.  Right below-knee popliteal artery and tibioperoneal trunk angioplasty at distal extent of the stent (4 mm x 80 mm Jade) 7.  Mynx closure of the left common femoral artery  Brisk PT doppler with patent arterial flow right LE On Heparin plan to restart Eliquis at discharge Restarted home meds to include Amiodarone for rate control. Cr recovering now 1.4 form 1.7  UO 700 last 24 hours IV fluids @ 134ml hr  Reloaded on Plavix 300 post op and now on 75 daily. Pending mobility he lives alone PT eval and treat was ordered.    Roxy Horseman 01/20/2020 9:33 AM --  Laboratory Lab Results: Recent Labs    01/19/20 0819 01/20/20 0426  WBC 6.7 6.2  HGB 11.5* 10.4*  HCT 35.6* 32.3*  PLT 153 164   BMET Recent Labs    01/18/20 0827 01/18/20 0827 01/18/20 0858 01/19/20 0547  NA 137   < > 138 139  K 4.5   < > 4.4 4.8  CL 104   < > 104 108  CO2 23  --   --  21*  GLUCOSE 101*    < > 97 93  BUN 22   < > 25* 19  CREATININE 1.70*   < > 1.70* 1.47*  CALCIUM 9.2  --   --  7.9*   < > = values in this interval not displayed.    COAG Lab Results  Component Value Date   INR 1.1 01/18/2020   INR 1.3 (H) 08/30/2019   INR 1.2 07/23/2019   No results found for: PTT  I have independently interviewed and examined patient and agree with PA assessment and plan above.  Foot is very well perfused.  Unfortunately his heart rate is elevated with known atrial fibrillation.  We will get him back on his home amiodarone and plan for Eliquis prior to discharge.  Creatinine is essentially at baseline today.  Patient does have extensive bilateral lower extremity interventions and hopefully we can keep his most recent intervention patent to prevent any future surgeries given history of failed bypass on 2 occasions and previous fasciotomies on the right.  Daziyah Cogan C. [name], MD [facility] [place] [facility] [place] Office: [phone] Pager: [phone]

## 2020-01-20 NOTE — Evaluation (Signed)
Physical Therapy Evaluation Patient Details Name: Donald Gordon MRN: [id number] DOB: 1941/03/04 Today's Date: 01/20/2020   History of Present Illness  Pt is a 79 y/o male admitted secondary to R LE pain and ischemia. Pt is now s/p right leg arteriogram and thrombolysis. Pt now in a-fib following procedure. PMH including but not limited to DM, HTN, CAD, PAD and PAF.    Clinical Impression  Pt presented supine in bed with HOB elevated, awake and willing to participate in therapy session. Prior to admission, pt reported that he was independent with all functional mobility and ADLs. Pt lives alone in a single level home with a couple of steps to enter. At the time of evaluation, pt mostly limited secondary to his elevated HR as pt now in a-fib per RN. Pt's HR at rest was in the low 120's and increased to as high as 150 bpm with activity. He was able to perform bed mobility with min A, transfers with supervision and take a few steps with RW and min guard for safety. Pt would continue to benefit from skilled physical therapy services at this time while admitted and after d/c to address the below listed limitations in order to improve overall safety and independence with functional mobility.     Follow Up Recommendations Home health PT    Equipment Recommendations  None recommended by PT    Recommendations for Other Services       Precautions / Restrictions Precautions Precautions: Fall Restrictions Weight Bearing Restrictions: No RLE Weight Bearing: Weight bearing as tolerated LLE Weight Bearing: Weight bearing as tolerated      Mobility  Bed Mobility Overal bed mobility: Needs Assistance Bed Mobility: Supine to Sit;Sit to Supine     Supine to sit: Min assist Sit to supine: Supervision   General bed mobility comments: assistance for trunk elevation to achieve upright sitting at EOB towards pt's R side  Transfers Overall transfer level: Needs assistance Equipment used: Rolling  walker (2 wheeled) Transfers: Sit to/from Stand Sit to Stand: Supervision         General transfer comment: pt preferring to pull with bilateral UEs on RW despite cueing for safe hand placement, no physical assistance needed, no instability noted  Ambulation/Gait Ambulation/Gait assistance: Min guard   Assistive device: Rolling walker (2 wheeled) Gait Pattern/deviations: Step-through pattern;Decreased stride length     General Gait Details: pt able to march in place and take a few steps forwards and backwards; however, very limited secondary to pt's elevated HR (to as high as 150 bpm)  Stairs            Wheelchair Mobility    Modified Rankin (Stroke Patients Only)       Balance Overall balance assessment: Needs assistance Sitting-balance support: Feet supported Sitting balance-Leahy Scale: Good     Standing balance support: During functional activity Standing balance-Leahy Scale: Fair                               Pertinent Vitals/Pain Pain Assessment: 0-10 Pain Score: 3  Pain Location: R LE Pain Descriptors / Indicators: Sore Pain Intervention(s): Monitored during session;Repositioned    Home Living Family/patient expects to be discharged to:: Private residence Living Arrangements: Alone Available Help at Discharge: Family;Friend(s);Available PRN/intermittently Type of Home: House Home Access: Stairs to enter Entrance Stairs-Rails: Psychiatric nurse of Steps: 2 Home Layout: One level Home Equipment: Walker - 2 wheels;Cane - single point  Prior Function Level of Independence: Independent               Hand Dominance        Extremity/Trunk Assessment   Upper Extremity Assessment Upper Extremity Assessment: Overall WFL for tasks assessed    Lower Extremity Assessment Lower Extremity Assessment: RLE deficits/detail RLE Deficits / Details: pt with decreased strength and AROM limitations secondary to post-op  pain and weakness; however, did not need any physical assistance with functional movement       Communication   Communication: No difficulties  Cognition Arousal/Alertness: Awake/alert Behavior During Therapy: WFL for tasks assessed/performed Overall Cognitive Status: Within Functional Limits for tasks assessed                                 General Comments: cognition not formally assessed but [facility] for general conversation      General Comments      Exercises     Assessment/Plan    PT Assessment Patient needs continued PT services  PT Problem List Decreased strength;Decreased range of motion;Decreased activity tolerance;Decreased balance;Decreased mobility;Decreased coordination;Decreased knowledge of use of DME;Decreased safety awareness;Decreased knowledge of precautions;Cardiopulmonary status limiting activity;Pain       PT Treatment Interventions DME instruction;Gait training;Stair training;Functional mobility training;Therapeutic activities;Therapeutic exercise;Neuromuscular re-education;Balance training;Patient/family education    PT Goals (Current goals can be found in the Care Plan section)  Acute Rehab PT Goals Patient Stated Goal: decrease pain, get his HR regulated PT Goal Formulation: With patient Time For Goal Achievement: 02/03/20 Potential to Achieve Goals: Good    Frequency Min 3X/week   Barriers to discharge        Co-evaluation               AM-PAC PT "6 Clicks" Mobility  Outcome Measure Help needed turning from your back to your side while in a flat bed without using bedrails?: None Help needed moving from lying on your back to sitting on the side of a flat bed without using bedrails?: A Little Help needed moving to and from a bed to a chair (including a wheelchair)?: None Help needed standing up from a chair using your arms (e.g., wheelchair or bedside chair)?: None Help needed to walk in hospital room?: None Help needed  climbing 3-5 steps with a railing? : A Little 6 Click Score: 22    End of Session Equipment Utilized During Treatment: Gait belt Activity Tolerance: Patient tolerated treatment well Patient left: in bed;with call bell/phone within reach;with bed alarm set Nurse Communication: Mobility status PT Visit Diagnosis: Other abnormalities of gait and mobility (R26.89);Pain Pain - Right/Left: Right Pain - part of body: Leg    Time: 7209-4709 PT Time Calculation (min) (ACUTE ONLY): 15 min   Charges:   PT Evaluation $PT Eval Moderate Complexity: 1 Mod          Eduard Clos, PT, DPT  Acute Rehabilitation Services Pager [phone] Office [place] 01/20/2020, 11:27 AM

## 2020-01-20 NOTE — Consult Note (Addendum)
Cardiology Consultation:   Patient ID: Donald Gordon MRN: [id number]; DOB: 1941-04-11  Admit date: 01/17/2020 Date of Consult: 01/20/2020  Primary Care Provider: [place] Cardiologist: [place] Electrophysiologist:  Cristopher Peru, MD    Patient Profile:   Donald Gordon is a 79 y.o. male with a hx of PAF on amiodarone and eliquis, nonobstructive CAD, HLD, HTN, and extensive PAD who is being seen today for the evaluation of atrial fibrillation at the request of Dr. Donnetta Hutching.  History of Present Illness:   Donald Gordon was previously seen by [facility] during a hospitalization 02/2018. He was transferred to [facility] from [facility] with chest pain found to be in Afib RVR. He was treated with IV cardizem and PO lopressor. He was anticoagulated with eliquis in the setting of plavix for vascular disease. Upon transfer, he was in NSR with bursts of Afib, treated with amiodarone given his comorbidities. Lexiscan myoview showed ischemic EKG changes which lead to subsequent heart catheterization. LHC 03/19/18 revealed 40% proximal RCA, 35% distal RCA, 30% proximal to mid LAD, 30% proximal to mid LCx. EF was 55-65% with mildly elevated LVEDP during the case. Medical therapy was recommended. He was discharged on amiodarone taper, plavix, and eliquis. He was instructed to follow up with his cardiologist at the [facility].   He has followed with [facility]. He was seen 12/2017 in the ER for ischemic leg. He has had multiple PV interventions at the [place] and with [facility], including right fem-pop bypass, prior iliac stenting and redo of his bypass; hx of thrombectomy due to thrombotic occlusion of bypass in 2019. Feb 2021, he had critical right lower extremity ischemia treated with stents placed to his TP trunk, popliteal artery, and SFA. He occluded these stents by 08/31/19 requiring mechanical thrombectomy and balloon angioplasty of right peroneal artery and stent of the TP trunk and proximal  SFA.   He was seen in consult by Dr. Donnetta Hutching 01/18/20 for approximately 20-hr history of right lower extremity pain. This was preceded by a brief interruption in eliquis and plavix for a routine colonoscopy. He underwent atherectomy of right SFA, popliteal, and TP trunk artery with stent placements. Unfortunately, he developed Afib RVR and cardiology was consulted.   He reports being aware of when he's in Afib. He was first diagnosed in 2017 and generally has a bout of RVR 1-2 times per year. He has been compliant on 100 mg amiodarone BID, 50 mg lopressor BID, and eliquis. He also reports having a stroke in 2017.    Past Medical History:  Diagnosis Date   Arthritis    "knees, elbows" (03/18/2018)   Chronic edema    a. Chronic RLE edema.   COPD (chronic obstructive pulmonary disease) (HCC)    Coronary artery disease    a. MI s/p balloon 1996, details unclear.   Dysrhythmia    PROXIMAL MARGIN FIBRILATION   GERD (gastroesophageal reflux disease)    Hyperlipidemia    Hypertension    Myocardial infarction ([place]) 1996   PAD (peripheral artery disease) ([place])    a. s/p stenting 08/2012, 02/2013.    PAF (paroxysmal atrial fibrillation) ([place])    Sleep apnea    "have mask; have to start wearing it" (03/18/2018)   Stroke [facility]) ~ 2014   denies residual on 03/18/2018   Thrombosis of lower extremity    a. Listed on patient's medical bracelet - at [place].   Type II diabetes mellitus ([place])  Past Surgical History:  Procedure Laterality Date   ABDOMINAL AORTOGRAM W/LOWER EXTREMITY N/A 07/25/2019   Procedure: ABDOMINAL AORTOGRAM W/LOWER EXTREMITY;  Surgeon: Waynetta Sandy, MD;  Location: [place] [facility];  Service: Cardiovascular;  Laterality: N/A;   ANTERIOR LUMBAR FUSION  1981   APPENDECTOMY     APPLICATION OF WOUND VAC Right 01/09/2018   Procedure: APPLICATION OF WOUND VAC ON RIGHT LOWER LEG;  Surgeon: Conrad La Jara, MD;  Location: [place];  Service: Vascular;  Laterality: Right;   BACK  SURGERY     CATARACT EXTRACTION W/ INTRAOCULAR LENS  IMPLANT, BILATERAL Bilateral    CORONARY ANGIOPLASTY WITH STENT PLACEMENT     "I've got 1 stent in there" (03/18/2018)   FASCIOTOMY Right 01/09/2018   Procedure: FASCIOTOMY RIGHT LOWER LEG;  Surgeon: Conrad Bethune, MD;  Location: [place];  Service: Vascular;  Laterality: Right;   FASCIOTOMY CLOSURE Right 01/11/2018   Procedure: FASCIOTOMY CLOSURE RIGHT CALF;  Surgeon: Conrad [place], MD;  Location: [place];  Service: Vascular;  Laterality: Right;   FEMORAL-POPLITEAL BYPASS GRAFT Right 01/09/2018   Procedure: RIGHT FEMORAL-POPLITEAL ARTERY BYPASS USING PROPATEN 6MM X 80CM VASCULAR GRAFT;  Surgeon: Conrad Kenton, MD;  Location: [place];  Service: Vascular;  Laterality: Right;   HEMORRHOID SURGERY     LEFT HEART CATH AND CORONARY ANGIOGRAPHY N/A 03/19/2018   Procedure: LEFT HEART CATH AND CORONARY ANGIOGRAPHY;  Surgeon: Martinique, Peter M, MD;  Location: [place] [facility];  Service: Cardiovascular;  Laterality: N/A;   LOWER EXTREMITY ANGIOGRAPHY N/A 08/31/2019   Procedure: LOWER EXTREMITY ANGIOGRAPHY;  Surgeon: [name], MD;  Location: [place] [facility];  Service: Cardiovascular;  Laterality: N/A;   LOWER EXTREMITY ANGIOGRAPHY Right 01/18/2020   Procedure: LOWER EXTREMITY ANGIOGRAPHY;  Surgeon: [name], MD;  Location: [place] [facility];  Service: Cardiovascular;  Laterality: Right;   PATCH ANGIOPLASTY Right 01/09/2018   Procedure: PATCH ANGIOPLASTY USING Rueben Bash BIOLOGIC 1CM X 6CM PATCH;  Surgeon: Conrad Kempner, MD;  Location: [place];  Service: Vascular;  Laterality: Right;   PERIPHERAL VASCULAR ATHERECTOMY Right 01/19/2020   Procedure: PERIPHERAL VASCULAR ATHERECTOMY;  Surgeon: [name], MD;  Location: [place] [facility];  Service: Cardiovascular;  Laterality: Right;  Femoral popliteal and tibioperoneal arteries.   PERIPHERAL VASCULAR BALLOON ANGIOPLASTY Right 09/01/2019   Procedure: PERIPHERAL VASCULAR BALLOON ANGIOPLASTY;   Surgeon: Waynetta Sandy, MD;  Location: [place] [facility];  Service: Cardiovascular;  Laterality: Right;  peroneal artery.   PERIPHERAL VASCULAR INTERVENTION Right 07/25/2019   Procedure: PERIPHERAL VASCULAR INTERVENTION;  Surgeon: Waynetta Sandy, MD;  Location: [place] [facility];  Service: Cardiovascular;  Laterality: Right;  SFA X 3   PERIPHERAL VASCULAR INTERVENTION Right 09/01/2019   Procedure: PERIPHERAL VASCULAR INTERVENTION;  Surgeon: Waynetta Sandy, MD;  Location: [place] [facility];  Service: Cardiovascular;  Laterality: Right;  superficial femoral   PERIPHERAL VASCULAR THROMBECTOMY Right 09/01/2019   Procedure: PERIPHERAL VASCULAR THROMBECTOMY;  Surgeon: Waynetta Sandy, MD;  Location: [place] [facility];  Service: Cardiovascular;  Laterality: Right;   PERIPHERAL VASCULAR THROMBECTOMY Right 01/18/2020   Procedure: PERIPHERAL VASCULAR THROMBECTOMY;  Surgeon: [name], MD;  Location: [place] [facility];  Service: Cardiovascular;  Laterality: Right;   PERIPHERAL VASCULAR THROMBECTOMY N/A 01/19/2020   Procedure: LYSIS RECHECK;  Surgeon: [name], MD;  Location: [place] [facility];  Service: Cardiovascular;  Laterality: N/A;   [place]  THROMBECTOMY FEMORAL ARTERY Right 01/09/2018   Procedure: THROMBECTOMY RIGHT LOWER LEG;  Surgeon: Conrad Basco, MD;  Location: [facility];  Service: Vascular;  Laterality: Right;   TONSILLECTOMY       Home Medications:  Prior to Admission medications   Medication Sig Start Date End Date Taking? Authorizing Provider  acetaminophen (TYLENOL) 500 MG tablet Take 500 mg by mouth 2 (two) times daily as needed (for pain or headaches).    Yes [provider]  amiodarone (PACERONE) 400 MG tablet Take 1 tablet (400 mg total) by mouth 2 (two) times daily. 03/19/18  Yes Almyra Deforest, PA  Cholecalciferol (VITAMIN D3 PO) Take 1 tablet by mouth daily.   Yes [provider]   Cyanocobalamin (VITAMIN B-12 PO) Take 1 tablet by mouth daily.   Yes [provider]  ezetimibe (ZETIA) 10 MG tablet Take 10 mg by mouth every evening.    Yes [provider]  Ferrous Sulfate (IRON PO) Take 1 tablet by mouth daily.   Yes [provider]  HYDROcodone-acetaminophen (NORCO) 5-325 MG tablet Take 1 tablet by mouth every 6 (six) hours as needed for moderate pain. 09/02/19  Yes Ulyses Amor, PA-C  memantine (NAMENDA) 10 MG tablet Take 5 mg by mouth in the morning.    Yes [provider]  metoprolol (LOPRESSOR) 50 MG tablet Take 50 mg by mouth 2 (two) times daily.   Yes [provider]  nitroGLYCERIN (NITROSTAT) 0.4 MG SL tablet Place 0.4 mg under the tongue every 5 (five) minutes as needed for chest pain.   Yes [provider]  pantoprazole (PROTONIX) 40 MG tablet Take 40 mg by mouth daily.   Yes [provider]  ramipril (ALTACE) 10 MG capsule Take 10 mg by mouth 2 (two) times daily.   Yes [provider]  apixaban (ELIQUIS) 5 MG TABS tablet Take 5 mg by mouth 2 (two) times daily. Patient not taking: Reported on 01/18/2020    [provider]  clopidogrel (PLAVIX) 75 MG tablet Take 1 tablet (75 mg total) by mouth daily. Patient not taking: Reported on 01/18/2020 01/14/18   Dagoberto Ligas, PA-C    Inpatient Medications: Scheduled Meds:  amiodarone  400 mg Oral BID   aspirin EC  81 mg Oral Daily   Chlorhexidine Gluconate Cloth  6 each Topical Daily   clopidogrel  75 mg Oral Daily   ezetimibe  10 mg Oral QPM   mouth rinse  15 mL Mouth Rinse BID   memantine  5 mg Oral q AM   metoprolol tartrate  50 mg Oral BID   potassium chloride  20-40 mEq Oral Once   ramipril  10 mg Oral BID   sodium chloride flush  3 mL Intravenous Q12H   sodium chloride flush  3 mL Intravenous Q12H   Continuous Infusions:  sodium chloride 100 mL/hr at 01/19/20 0846   sodium chloride 999 mL/hr at 01/18/20 1245   sodium  chloride     sodium chloride     heparin 1,050 Units/hr (01/20/20 0848)   PRN Meds: sodium chloride, sodium chloride, acetaminophen, bisacodyl, hydrALAZINE, labetalol, midazolam, morphine injection, morphine injection, nitroGLYCERIN, ondansetron (ZOFRAN) IV, oxyCODONE-acetaminophen, sodium chloride flush, sodium chloride flush  Allergies:    Allergies  Allergen Reactions   Oxycodone Nausea Only   Statins Other (See Comments)    Muscle and Bone pain Muscle and Bone pain   Donepezil Other (See Comments)    [facility]   Imipramine Other (See  Comments)    [facility]   Oxybutynin Chloride Swelling and Other (See Comments)    Per [facility]    Social History:   Social History   Socioeconomic History   Marital status: Legally Separated    Spouse name: Not on file   Number of children: Not on file   Years of education: Not on file   Highest education level: Not on file  Occupational History   Not on file  Tobacco Use   Smoking status: Former Smoker    Packs/day: 3.00    Years: 30.00    Pack years: 90.00    Quit date: 1996    Years since quitting: 25.5   Smokeless tobacco: Never Used  Scientific laboratory technician Use: Never used  Substance and Sexual Activity   Alcohol use: No    Comment: Remote alcohol use   Drug use: Never   Sexual activity: Not Currently  Other Topics Concern   Not on file  Social History Narrative   Not on file   Social Determinants of Health   Financial Resource Strain:    Difficulty of Paying Living Expenses:   Food Insecurity:    Worried About Charity fundraiser in the Last Year:    Arboriculturist in the Last Year:   Transportation Needs:    Film/video editor (Medical):    Lack of Transportation (Non-Medical):   Physical Activity:    Days of Exercise per Week:    Minutes of Exercise per Session:   Stress:    Feeling of Stress :   Social Connections:    Frequency of Communication with Friends and Family:    Frequency of Social Gatherings with Friends  and Family:    Attends Religious Services:    Active Member of Clubs or Organizations:    Attends Music therapist:    Marital Status:   Intimate Partner Violence:    Fear of Current or Ex-Partner:    Emotionally Abused:    Physically Abused:    Sexually Abused:     Family History:    Family History  Problem Relation Age of Onset   CAD Mother    CAD Brother    Hypertension Other      ROS:  Please see the history of present illness.   All other ROS reviewed and negative.     Physical Exam/Data:   Vitals:   01/20/20 0327 01/20/20 0748 01/20/20 1216 01/20/20 1606  BP: (!) 132/72 (!) 130/47 (!) 125/63 (!) 123/56  Pulse: (!) 110 (!) 117 (!) 116 68  Resp: 19 18 18 18   Temp: 98.6 F (37 C) 97.7 F (36.5 C) 98.5 F (36.9 C) 98 F (36.7 C)  TempSrc: Oral Oral Oral Oral  SpO2: 93% 92% 90% 93%  Weight:      Height:        Intake/Output Summary (Last 24 hours) at 01/20/2020 1713 Last data filed at 01/20/2020 1200 Gross per 24 hour  Intake 309.33 ml  Output 650 ml  Net -340.67 ml   Last 3 Weights 01/18/2020 10/07/2019 08/30/2019  Weight (lbs) 187 lb 187 lb 186 lb  Weight (kg) 84.823 kg 84.823 kg 84.369 kg     Body mass index is 27.62 kg/m.  General:  Elderly male, in no acute distress HEENT: normal Neck: no JVD Vascular: No carotid bruits Cardiac:  normal S1, S2; RRR; no murmur  Lungs:  clear to auscultation bilaterally, no wheezing, rhonchi or  rales  Abd: soft, nontender, no hepatomegaly  Ext: no edema Musculoskeletal:  No deformities, BUE and BLE strength normal and equal Skin: warm and dry  Neuro:  CNs 2-12 intact, no focal abnormalities noted Psych:  Normal affect   EKG:  The EKG was personally reviewed and demonstrates:  Sinus rhythm with HR 80 with PACs, RBBB (new) Telemetry:  Telemetry was personally reviewed and demonstrates:  Appears to have converted to Afib with possible short burst of flutter 0230 - 1410, now in sinus with HR in the  60s  Relevant CV Studies:  Left heart cath 02/2018 Prox RCA lesion is 40% stenosed. Dist RCA lesion is 35% stenosed. Prox LAD to Mid LAD lesion is 30% stenosed. Prox Cx to Mid Cx lesion is 30% stenosed. The left ventricular systolic function is normal. LV end diastolic pressure is mildly elevated. The left ventricular ejection fraction is 55-65% by visual estimate.   1. Nonobstructive CAD 2. Normal LV function 3. Mildly elevated LVEDP   Plan: medical therapy focused on control of Afib. Will stop ASA. Continue Plavix and resume Eliquis this pm   Recommend to resume Apixaban, at currently prescribed dose and frequency, on 03/19/18.  Recommend concurrent antiplatelet therapy of Clopidogrel 75mg  daily for 12 months.   Echo 12/2017: Study Conclusions   - Left ventricle: The cavity size was normal. There was mild    concentric hypertrophy. Systolic function was normal. The    estimated ejection fraction was in the range of 60% to 65%. Wall    motion was normal; there were no regional wall motion    abnormalities. Features are consistent with a pseudonormal left    ventricular filling pattern, with concomitant abnormal relaxation    and indeterminate filling pressure (grade 2 diastolic    dysfunction). Doppler parameters are consistent with high    ventricular filling pressure.  - Aortic valve: There was no regurgitation.  - Mitral valve: Transvalvular velocity was within the normal range.    There was no evidence for stenosis. There was no regurgitation.  - Left atrium: The atrium was moderately dilated.  - Right ventricle: The cavity size was normal. Wall thickness was    normal. Systolic function was normal.  - Atrial septum: No defect or patent foramen ovale was identified    by color flow Doppler.  - Tricuspid valve: There was trivial regurgitation.  - Pulmonary arteries: Systolic pressure was within the normal    range.   Laboratory Data:  High Sensitivity Troponin:  No  results for input(s): TROPONINIHS in the last 720 hours.   Chemistry Recent Labs  Lab 01/18/20 0827 01/18/20 0858 01/19/20 0547  NA 137 138 139  K 4.5 4.4 4.8  CL 104 104 108  CO2 23  --  21*  GLUCOSE 101* 97 93  BUN 22 25* 19  CREATININE 1.70* 1.70* 1.47*  CALCIUM 9.2  --  7.9*  GFRNONAA 38*  --  45*  GFRAA 43*  --  52*  ANIONGAP 10  --  10    No results for input(s): PROT, ALBUMIN, AST, ALT, ALKPHOS, BILITOT in the last 168 hours. Hematology Recent Labs  Lab 01/19/20 0547 01/19/20 0819 01/20/20 0426  WBC 6.3 6.7 6.2  RBC 3.41* 3.60* 3.29*  HGB 10.9* 11.5* 10.4*  HCT 33.7* 35.6* 32.3*  MCV 98.8 98.9 98.2  MCH 32.0 31.9 31.6  MCHC 32.3 32.3 32.2  RDW 13.8 13.8 13.8  PLT 145* 153 164   BNPNo results for input(s): BNP,  PROBNP in the last 168 hours.  DDimer No results for input(s): DDIMER in the last 168 hours.   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  Thrombolytic's catheter check of the right lower extremity bypass 2.  Right lower extremity arteriogram including selection of third order branches 3.  Right proximal SFA laser atherectomy (2.0 mm Auryon laser) 4.  Right below-knee popliteal and tibioperoneal trunk laser arthrectomy (2.0 mm Auryon laser) 5.  Right proximal SFA angioplasty at the proximal extent of the stent (5 mm x 60 mm Jade) 6.  Right below-knee popliteal artery and tibioperoneal trunk angioplasty at distal extent of the stent (4 mm x 80 mm Jade) 7.  Mynx closure of the left common femoral artery   Indications: 79 year old male who has undergone multiple bypasses in the right lower  extremity most recently with a common femoral to below-knee popliteal prosthetic bypass that occluded and was realigned with a Viabahn by Dr. [name].  This then reoccluded a second time and has had multiple lytics to salvage the bypass with proximal stent into SFA/common femoral junction and distal stents into TP trunk.  He presented to the ED yesterday with acute on chronic limb ischemia and his bypass was thrombosed.  He presents today for lytics check.   Findings:   After overnight lysis the right femoral to below-knee popliteal bypass is now patent and there is flow down the right lower extremity into the PT as single-vessel tibial runoff.  There appeared to be some residual thrombus and stenosis in the proximal SFA at the proximal portion of the stent and also some residual thrombus and stenosis in the below-knee popliteal tibioperoneal trunk stent.  Both these areas were treated with laser arthrectomy at 4 MHz and 60 MHz and then angioplastied accordingly as noted above.  He now has inline flow down the right lower extremity with a brisk PT signal.  He does have some common femoral disease on the right proximal to his bypass but did not feel this was amendable to endovascular treatment.             Procedure:  The patient was identified in the holding area and taken to room 8.  The patient was then placed supine on the table and prepped and draped in the usual sterile fashion.  A time out was called.  Initially removed the inner cannula from the lytics catheter down the right lower extremity bypass.  We then placed a [place] core 014 wire down the right peroneal and the lytics catheter subsequently removed.  I then performed hand injections of the right lower extremity bypass through the sheath and this now showed the bypass is patent but as noted above there was residual thrombus in the proximal SFA at the proximal portion of the stent with some residual stenosis as well as residual thrombus in the below-knee  popliteal and tibioperoneal trunk and with some stenosis.  That point in time elected to perform laser arthrectomy of both these lesions otherwise did not see any other defects with preserved runoff through the  posterior tibial which has been his runoff in the past.  That point in time laser arthrectomy with a 2.0 mm Auryon laser was initially performed in the proximal right SFA stent across the ostium at the common femoral artery junction at 51 MHz and 60 MHz with laser and then went down distal and treated the below-knee popliteal artery tibioperoneal trunk within the existing stents again with Auryon laser at 50 MHz and 60 MHz.  The proximal SFA stent was angioplastied with a 5 mm Jade to nominal pressure for 2 minutes and the below knee popliteal artery and tibioperoneal trunk stent was ballooned with a 4 mm Jade balloon to nominal pressure for 2 minutes.  He now has inline flow down the right leg bypass with preserved runoff in the right lower extremity through the posterior tibial.  He has a brisk posterior signal in the right foot.  Wires and catheters were removed and we did a short 6 French sheath in the left groin.  A groin access shot was obtained and a mynx closure was placed in the left groin.     Plan: Patient will resume heparin immediately in the Cath Lab and appreciate pharmacy for adjustments.     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  Ultrasound-guided access of the left common femoral artery 2.  Aortogram including catheter selection of aorta 3.   Right lower extremity arteriogram with selection of third order branches 4.  Placement of thrombolytic's catheter in the right lower extremity bypass with initiation of catheter directed thrombolysis 5.  44 minutes of monitored moderate conscious sedation time   Indications: 78 year old male who presented to the ED this morning with acute right leg pain that started yesterday and found to have acute on chronic limb ischemia.  He presents today for planned right leg arteriogram and possible intervention and we will plan for thrombolysis.  He is high risk for limb loss and has recently undergone lysis for bypass salvage in March.    Findings:   Aortogram showed a patent infrarenal aorta as well as on the right a patent common and external iliac artery with no flow limiting stenosis (althgouh diffusely disease) and patent right external iliac stent.  The right common femoral as well as the profunda are patent even though the profunda has a high-grade distal stenosis.  The right common femoral to below-knee popliteal bypass is occluded including all the stents that were previously placed to realign the bypass.  The [place] stent in the TP trunk is also occluded.  He has acute thrombus in the TP trunk distal to the stent.  He does reconstitute a posterior tibial artery distally as dominant runoff.  A lytics catheter was placed down the right leg bypass into the TP trunk and lysis initiated.             Procedure:  The patient was identified in the holding area and taken to room 8.  The patient was then placed supine on the table  and prepped and draped in the usual sterile fashion.  A time out was called.  Ultrasound was used to evaluate the left common femoral artery.  It was patent .  A digital ultrasound image was acquired.  A micropuncture needle was used to access the left common femoral artery under ultrasound guidance.  An 018 wire was advanced without resistance and a micropuncture sheath was placed.  The 018 wire  was removed and a benson wire was placed.  The micropuncture sheath was exchanged for a 5 french sheath.  An omniflush catheter was advanced over the wire to the level of L-1.  An abdominal angiogram was obtained.  Next attempted to cross the aortic bifurcation with a Omni Flush catheter but had to exchange for a crossover catheter with a Glidewire advantage and ultimately got a wire down into the right common femoral.  Tried to cannulate the proximal bypass initially with a straight catheter and Glidewire advantage and exchanged for an angled KMP catheter and ultimately was able to cannulate the bypass.  That point in time 6 [place] Ansell sheath was placed in the left groin over the aortic bifurcation.  Patient was given 5000 units of IV heparin to ensure he was therapeutic and had been started on heparin drip in the ED.  I then got a wire all the way down into the tibioperoneal trunk and got a long quick cross catheter down distal into the TP trunk and shot hand-injection that showed the TP trunk stent was occluded as well as other acute thrombus in the TP trunk but there was PT runoff distally.  That point in time placed a long 50 cm infusion length UniFuse catheter in the right lower extremity bypass down to the TP trunk.  He will be initiated on thrombolysis and taken to the ICU.   Plan: Patient will be initiated on TPA at 1 mg/hr through the UniFuse catheter in the right lower extremity bypass with heparin at 800 units/hr through the sheath and come back tomorrow for thrombolytics catheter check.     [name], MD [facility] [place] Office: [phone]      {  Assessment and Plan:   Paroxysmal atrial fibrillation - RVR approximately 0230-1410 - rate and rhythm control with 100 mg amiodarone BID, lopressor 50 mg BID added today - anticoagulated with eliquis This patients CHA2DS2-VASc Score and unadjusted Ischemic Stroke Rate (% per year) is equal to 9.7 % stroke  rate/year from a score of 6 (2age, HTN, PAD, 2CVA) - recommend resuming home dose of amiodarone 100 mg BID tomorrow - resume AC when safe to do so per surgeon - will check Mg and TSH tomorrow for completeness - follow up with [place] cardiology within 2 weeks of discharge   Nonobstructive coronary artery disease - no anginal symptoms - recommend PCSK9i   Hypertension - continue ACEI - pressures well controlled this afternoon   Hyperlipidemia with LDL goal < 70 - continue zeita - intolerant to statins - per primary cardiologist but he would benefit from a PCSK9i   PAD - continue plavix, per primary     For questions or updates, please contact [facility] Please consult [email or web address] for contact info under    Signed, Ledora Bottcher, PA  01/20/2020 5:13 PM

## 2020-01-20 NOTE — Progress Notes (Signed)
ANTICOAGULATION CONSULT NOTE-Follow up   Pharmacy Consult for heparin Indication: leg ischemia  Allergies  Allergen Reactions  . Oxycodone Nausea Only  . Statins Other (See Comments)    Muscle and Bone pain Muscle and Bone pain  . Donepezil Other (See Comments)    [facility]  . Imipramine Other (See Comments)    Per [facility]  . Oxybutynin Chloride Swelling and Other (See Comments)    [facility]    Patient Measurements: Height: 5\' 9"  (175.3 cm) (84.8kg) Weight: 84.8 kg (187 lb) (per last visit) IBW/kg (Calculated) : 70.7 Heparin Dosing Weight: 84.8 kg  Vital Signs: Temp: 98.6 F (37 C) (07/30 0327) Temp Source: Oral (07/30 0327) BP: 132/72 (07/30 0327) Pulse Rate: 110 (07/30 0327)  Labs: Recent Labs    01/18/20 0827 01/18/20 0827 01/18/20 0858 01/18/20 1511 01/19/20 0152 01/19/20 0547 01/19/20 0819 01/19/20 2124 01/20/20 0426  HGB 13.6   < > 13.9   < >   < > 10.9* 11.5*  --  10.4*  HCT 42.1   < > 41.0   < >  --  33.7* 35.6*  --  32.3*  PLT 184  --   --    < >  --  145* 153  --  164  APTT 23*  --   --   --   --   --   --   --   --   LABPROT 13.3  --   --   --   --   --   --   --   --   INR 1.1  --   --   --   --   --   --   --   --   HEPARINUNFRC  --   --   --    < >   < >  --  0.19* 0.36 0.21*  CREATININE 1.70*  --  1.70*  --   --  1.47*  --   --   --    < > = values in this interval not displayed.    Estimated Creatinine Clearance: 40.7 mL/min (A) (by C-G formula based on SCr of 1.47 mg/dL (H)).   Assessment: 54 yom with extensive history of peripheral vascular disease, multiple prior stents that have clotted off presenting with R leg pain.   Patient states he last took Eliquis on 7/23 and Plavix on 7/20 as instructed prior to colonoscopy on 7/26. Per colonoscopy note, instructed to continue holding after polypectomy for additional week.   Patient started on heparin 7/28. Now s/p R leg arteriogram 7/28 PM and catheter-directed thrombolysis.  S/p  arthrectomy/angioplasty on 01/19/20 and IV heparin was resumed at 1000 units/hr per Vascular. Nurse noted patient experienced and episode of Afib on 7/30 at 0306 treated with metoprolol. Current heparin level is 0.21 which is on the lower end of goal. Contacted nurse to confirm no bleeding or acute changes in patient condition.   Goal of Therapy:  Heparin level 0.2-0.5 units/ml Monitor platelets by anticoagulation protocol: Yes   Plan:  Increase heparin to 1050 units/hr to maintain goal of 0.2-.05  Monitor for continued episodes of afib  Monitor daily heparin level and CBC, s/sx bleeding  Cephus Slater, PharmD, [facility] Pharmacy Resident [phone] 01/20/2020 8:35 AM

## 2020-01-21 DIAGNOSIS — I998 Other disorder of circulatory system: Secondary | ICD-10-CM

## 2020-01-21 DIAGNOSIS — Z959 Presence of cardiac and vascular implant and graft, unspecified: Secondary | ICD-10-CM

## 2020-01-21 LAB — CBC
HCT: 30.9 % — ABNORMAL LOW (ref 39.0–52.0)
Hemoglobin: 9.8 g/dL — ABNORMAL LOW (ref 13.0–17.0)
MCH: 31.5 pg (ref 26.0–34.0)
MCHC: 31.7 g/dL (ref 30.0–36.0)
MCV: 99.4 fL (ref 80.0–100.0)
Platelets: 161 10*3/uL (ref 150–400)
RBC: 3.11 MIL/uL — ABNORMAL LOW (ref 4.22–5.81)
RDW: 14.3 % (ref 11.5–15.5)
WBC: 7 10*3/uL (ref 4.0–10.5)
nRBC: 0 % (ref 0.0–0.2)

## 2020-01-21 LAB — HEPARIN LEVEL (UNFRACTIONATED): Heparin Unfractionated: 0.29 IU/mL — ABNORMAL LOW (ref 0.30–0.70)

## 2020-01-21 LAB — TSH: TSH: 6.863 u[IU]/mL — ABNORMAL HIGH (ref 0.350–4.500)

## 2020-01-21 LAB — MAGNESIUM: Magnesium: 1.7 mg/dL (ref 1.7–2.4)

## 2020-01-21 MED ORDER — APIXABAN 5 MG PO TABS
5.0000 mg | ORAL_TABLET | Freq: Two times a day (BID) | ORAL | Status: DC
  Administered 2020-01-21: 5 mg via ORAL
  Filled 2020-01-21: qty 1

## 2020-01-21 MED ORDER — AMIODARONE HCL 100 MG PO TABS: 100.0000 mg | ORAL_TABLET | Freq: Two times a day (BID) | ORAL | 1 refills | Status: DC

## 2020-01-21 NOTE — TOC Transition Note (Deleted)
Transition of Care [facility]) - CM/SW Discharge Note   Patient Details  Name: [name] MRN: [id number] Date of Birth: 18-Dec-1940  Transition of [facility]) CM/SW Contact:  Bary Castilla, LCSW Phone Number: [phone] 01/21/2020, 11:44 AM   Clinical Narrative:     Patient will DC to:?[facility] Anticipated DC date:?01/21/20 Family notified:?Cathy-left message Transport EL:FYBO    Per MD patient ready for DC to [facility]. RN, patient, patient's family, and facility notified of DC. Discharge Summary sent to facility. RN given number for report  [phone] room 106P DC packet on chart. Ambulance transport requested for patient.   CSW signing off.   [place], [place] [phone]   Final next level of care: Home/Self Care Barriers to Discharge: No Barriers Identified   Patient Goals and CMS Choice Patient states their goals for this hospitalization and ongoing recovery are:: to return home. CMS Medicare.gov Compare Post Acute Care list provided to:: Patient Choice offered to / list presented to : Patient  Discharge Placement                       Discharge Plan and Services In-house Referral: NA Discharge Planning Services: CM Consult Post Acute Care Choice: Home Health          DME Arranged: N/A DME Agency: NA       HH Arranged: Refused HH          Social Determinants of Health (SDOH) Interventions     Readmission Risk Interventions Readmission Risk Prevention Plan 01/21/2020 07/26/2019  Post Dischage Appt - Complete  Medication Screening - Complete  Transportation Screening Complete Complete  HRI or Home Care Consult Complete -  Social Work Consult for [place] Planning/Counseling Complete -  Palliative Care Screening Not Applicable -  Medication Review Press photographer) Complete -  Some recent data might be hidden

## 2020-01-21 NOTE — Discharge Summary (Signed)
[facility] Discharge Summary   Patient ID:  Donald Gordon MRN: [id number] DOB/AGE: 09-28-1940 79 y.o.  Admit date: 01/17/2020 Discharge date: 01/21/2020 Date of Surgery: 01/19/2020 Surgeon: Surgeon(s): [name], MD  Admission Diagnosis: Limb ischemia [I99.8] Critical limb ischemia with history of revascularization of same extremity [I99.8, Z95.9]  Discharge Diagnoses:  Limb ischemia [I99.8] Critical limb ischemia with history of revascularization of same extremity [I99.8, Z95.9]  Secondary Diagnoses: Past Medical History:  Diagnosis Date  . Arthritis    "knees, elbows" (03/18/2018)  . Chronic edema    a. Chronic RLE edema.  Marland Kitchen COPD (chronic obstructive pulmonary disease) ([place])   . Coronary artery disease    a. MI s/p balloon 1996, details unclear.  Marland Kitchen Dysrhythmia    PROXIMAL MARGIN FIBRILATION  . GERD (gastroesophageal reflux disease)   . Hyperlipidemia   . Hypertension   . Myocardial infarction ([place]) 1996  . PAD (peripheral artery disease) ([place])    a. s/p stenting 08/2012, 02/2013.   Marland Kitchen PAF (paroxysmal atrial fibrillation) ([place])   . Sleep apnea    "have mask; have to start wearing it" (03/18/2018)  . Stroke [facility]) ~ 2014   denies residual on 03/18/2018  . Thrombosis of lower extremity    a. Listed on patient's medical bracelet - at [place].  Marland Kitchen Type II diabetes mellitus (HCC)     Procedure(s): LYSIS RECHECK PERIPHERAL VASCULAR ATHERECTOMY  Discharged Condition: fair  HPI: This is a 79 y.o. male presents with approximately 20-hour history of right lower extremity pain associated with dysesthesia and decreased motor function of his right foot and ankle.  Unable to obtain Doppler pulses.  Past medical history is significant for revascularization of his lower extremity with multiple stents.  Recently off his Eliquis and Plavix for colonoscopy.  Vital signs are stable.  Heparin drip initiated.  Current creatinine is 1.7 up from 1.19 on September 02, 2019.  Recurrent occlusion of right femoral to below-knee popliteal bypass.  Had lysis and stenting at the proximal and distal anastomosis in March 2021.  Presents now with 24 hours of obvious ischemia.  Reports severe pain associated with this.  This has improved with hydration.  He does have motor and sensory function intact in his foot and his calf is not tender to compression.  I had a long discussion with the patient.  I explained that I clearly as at risk for limb loss.  Explained that with each intervention, revascularization is more difficult.  Feel that his best option is for repeat endovascular treatment with lysis.  Discussed this also with Dr. Carlis Abbott who will proceed with arteriography and lysis initiation later today.   Hospital Course:  [name] is a 79 y.o. male is S/P  Procedure(s):  01/18/20  1.  Ultrasound-guided access of the left common femoral artery 2.  Aortogram including catheter selection of aorta 3.  Right lower extremity arteriogram with selection of third order branches 4.  Placement of thrombolytic's catheter in the right lower extremity bypass with initiation of catheter directed thrombolysis  By Dr. Carlis Abbott  01/19/20 1.  Thrombolytic's catheter check of the right lower extremity bypass 2.  Right lower extremity arteriogram including selection of third order branches 3.  Right proximal SFA laser atherectomy (2.0 mm Auryon laser) 4.  Right below-knee popliteal and tibioperoneal trunk laser arthrectomy (2.0 mm Auryon laser) 5.  Right proximal SFA angioplasty at the proximal extent of the stent (5 mm x 60 mm Jade) 6.  Right below-knee popliteal artery and tibioperoneal trunk angioplasty at distal extent of the stent (4 mm x 80 mm Jade) 7.  Mynx closure of the left common femoral artery  Extubated: POD # 0 Post-op wounds clean, dry, intact or healing well Pt. Ambulating, voiding and taking PO diet without difficulty. Pt pain controlled with PO pain meds. Labs  as below Complications:none  Patient tolerated the procedure well. Day of surgery he had a brisk right PT signal.  Right calf soft. He was placed on loading dose of Plavix at 300 and start regular Plavix tomorrow at 75mg .  Continued on heparin per pharmacy.  POD#1 he had no complaints. Right lower extremity feeling better with some mild weakness. Brisk doppler PT signal. Left groin without hematoma. Heparin continued. Cardiology was consulted due to patient going into atrial fibrillation with RVR. He was continued on his Amiodarone 100 mg BID as well as lopressor 50 mg BID. His Scr remained stable. PT evaluated and recommended home Health PT.  POD#2 Patient continued to do well post op. Right lower extremity well perfused and warm with brisk doppler signals. Some continued right lower extremity numbness per patient. He remained stable for discharge with heart rate well controlled on home medications. He will continue on his home medications and follow up with outpatient cardiology at the [facility]. He was transitioned from heparin to Eliquis for discharge . He has declined any home health PT. He will continue his Plavix and aspirin. He will follow up with Dr. Carlis Abbott in 4 weeks  Consults:  Treatment Team:  [name], MD Waynetta Sandy, MD  Significant Diagnostic Studies: CBC Lab Results  Component Value Date   WBC 7.0 01/21/2020   HGB 9.8 (L) 01/21/2020   HCT 30.9 (L) 01/21/2020   MCV 99.4 01/21/2020   PLT 161 01/21/2020    BMET    Component Value Date/Time   NA 139 01/19/2020 0547   K 4.8 01/19/2020 0547   CL 108 01/19/2020 0547   CO2 21 (L) 01/19/2020 0547   GLUCOSE 93 01/19/2020 0547   BUN 19 01/19/2020 0547   CREATININE 1.47 (H) 01/19/2020 0547   CALCIUM 7.9 (L) 01/19/2020 0547   GFRNONAA 45 (L) 01/19/2020 0547   GFRAA 52 (L) 01/19/2020 0547   COAG Lab Results  Component Value Date   INR 1.1 01/18/2020   INR 1.3 (H) 08/30/2019   INR 1.2 07/23/2019      Disposition:  Discharge to :Home Discharge Instructions    Discharge patient   Complete by: As directed    Once he is transitioned back to his Eliquis and off of heparin and HHPT arranged   Discharge disposition: 01-Home or Self Care   Discharge patient date: 01/21/2020     Allergies as of 01/21/2020      Reactions   Oxycodone Nausea Only   Statins Other (See Comments)   Muscle and Bone pain Muscle and Bone pain   Donepezil Other (See Comments)   [facility]   Imipramine Other (See Comments)   Per [facility]   Oxybutynin Chloride Swelling, Other (See Comments)   Per [facility]      Medication List    STOP taking these medications   HYDROcodone-acetaminophen 5-325 MG tablet Commonly known as: Norco     TAKE these medications   acetaminophen 500 MG tablet Commonly known as: TYLENOL Take 500 mg by mouth 2 (two) times daily as needed (for pain or headaches).   amiodarone 100 MG tablet Commonly known  as: PACERONE Take 1 tablet (100 mg total) by mouth 2 (two) times daily. What changed:   medication strength  how much to take   clopidogrel 75 MG tablet Commonly known as: PLAVIX Take 1 tablet (75 mg total) by mouth daily.   Eliquis 5 MG Tabs tablet Generic drug: apixaban Take 5 mg by mouth 2 (two) times daily.   ezetimibe 10 MG tablet Commonly known as: ZETIA Take 10 mg by mouth every evening.   IRON PO Take 1 tablet by mouth daily.   memantine 10 MG tablet Commonly known as: NAMENDA Take 5 mg by mouth in the morning.   metoprolol tartrate 50 MG tablet Commonly known as: LOPRESSOR Take 50 mg by mouth 2 (two) times daily.   nitroGLYCERIN 0.4 MG SL tablet Commonly known as: NITROSTAT Place 0.4 mg under the tongue every 5 (five) minutes as needed for chest pain.   pantoprazole 40 MG tablet Commonly known as: PROTONIX Take 40 mg by mouth daily.   ramipril 10 MG capsule Commonly known as: ALTACE Take 10 mg by mouth 2 (two) times daily.   VITAMIN B-12 PO Take 1  tablet by mouth daily.   VITAMIN D3 PO Take 1 tablet by mouth daily.      Verbal and written Discharge instructions given to the patient. Wound care per Discharge AVS  Follow-up Information    [name], MD Follow up in 4 week(s).   Specialty: Vascular Surgery Why: the office will call the patient with folllow up appointment Contact information: [place] [address] [phone]               Signed: Karoline Caldwell 01/21/2020, 10:45 AM

## 2020-01-21 NOTE — Progress Notes (Addendum)
Progress Note    01/21/2020 7:54 AM 2 Days Post-Op  Subjective:  No real complaints except states he did not sleep well last night. Says some continued numbness in right leg from just above knee down into right foot- this is unchanged from prior to surgery. Has ambulated a little   Vitals:   01/20/20 2311 01/21/20 0449  BP: (!) 115/49 (!) 129/56  Pulse: 74 69  Resp: 15 13  Temp: 98 F (36.7 C) 98.1 F (36.7 C)  SpO2: 90% 90%   Physical Exam: Cardiac: regular Lungs: non labored Incisions:  Left femoral access site clean, dry and intact. Soft. Mild tenderness and swelling. No hematoma Extremities:  Bilateral lower extremities well perfused and warm. Doppler DP/ PT/ pero signals bilaterally Abdomen: soft and non tender, non distended Neurologic: alert and oriented  CBC    Component Value Date/Time   WBC 7.0 01/21/2020 0207   RBC 3.11 (L) 01/21/2020 0207   HGB 9.8 (L) 01/21/2020 0207   HCT 30.9 (L) 01/21/2020 0207   PLT 161 01/21/2020 0207   MCV 99.4 01/21/2020 0207   MCH 31.5 01/21/2020 0207   MCHC 31.7 01/21/2020 0207   RDW 14.3 01/21/2020 0207   LYMPHSABS 1.4 01/18/2020 0827   MONOABS 1.4 (H) 01/18/2020 0827   EOSABS 0.1 01/18/2020 0827   BASOSABS 0.0 01/18/2020 0827    BMET    Component Value Date/Time   NA 139 01/19/2020 0547   K 4.8 01/19/2020 0547   CL 108 01/19/2020 0547   CO2 21 (L) 01/19/2020 0547   GLUCOSE 93 01/19/2020 0547   BUN 19 01/19/2020 0547   CREATININE 1.47 (H) 01/19/2020 0547   CALCIUM 7.9 (L) 01/19/2020 0547   GFRNONAA 45 (L) 01/19/2020 0547   GFRAA 52 (L) 01/19/2020 0547    INR    Component Value Date/Time   INR 1.1 01/18/2020 0827     Intake/Output Summary (Last 24 hours) at 01/21/2020 0754 Last data filed at 01/20/2020 1853 Gross per 24 hour  Intake 480 ml  Output 350 ml  Net 130 ml     Assessment/Plan:  79 y.o. male is s/p 1.  Thrombolytic's catheter check of the right lower extremity bypass 2.  Right lower  extremity arteriogram including selection of third order branches 3.  Right proximal SFA laser atherectomy (2.0 mm Auryon laser) 4.  Right below-knee popliteal and tibioperoneal trunk laser arthrectomy (2.0 mm Auryon laser) 5.  Right proximal SFA angioplasty at the proximal extent of the stent (5 mm x 60 mm Jade) 6.  Right below-knee popliteal artery and tibioperoneal trunk angioplasty at distal extent of the stent (4 mm x 80 mm Jade) 7.  Mynx closure of the left common femoral artery   2 Days Post-Op. His foot is well perfused and warm. Doppler PT/ Dp/ pero signals bilaterally. He developed atrial fibrillation with RVR post operatively.  Cardiology consult appreciated. Recommending continued Amiodarone 100 Mg BID, metoprolol 50 mg BID and continued heparin with transition to Eliquis at discharge. Recommend he have follow up with [place] cardiologist in 2 weeks On heparin and Plavix and Aspirin. PT recommending HHPT. Mobilize this morning.  He is otherwise stable for discharge.  Will transition patient to Eliquis anticipating discharge possibly later today  DVT prophylaxis:  Heparin gtt   Karoline Caldwell, PA-C [facility] [phone] 01/21/2020 7:54 AM   I have interviewed the patient and examined the patient. I agree with the findings by the PA. The right foot is warm  and well-perfused.  He will start Eliquis today and can then be discharged later this afternoon.  Gae Gallop, MD [phone]

## 2020-01-21 NOTE — TOC Initial Note (Signed)
Transition of Care [facility]) - Initial/Assessment Note    Patient Details  Name: Donald Gordon MRN: [id number] Date of Birth: May 26, 1941  Transition of Care [facility]) CM/SW Contact:    Bethena Roys, RN Phone Number: 01/21/2020, 9:14 AM  Clinical Narrative:  Risk for readmission assessment completed. Patient is from home alone in [place]. Case Manager discussed home health services with the patient and the patient has declined services. Patient states he will not need services. Case Manager has made patient aware to call his primary care provider if he needs services in the future. Patient states his plan is tor drive home- his car is parked at the ED. He is not going home; initially he will go to his significant others house for a while before returing home. Patient states he has a rolling walker in his car. Staff RN to make MD aware of patient's above plan of driving home. No further needs from Case Manager at this time.                 Expected Discharge Plan: Home/Self Care Barriers to Discharge: No Barriers Identified   Patient Goals and CMS Choice Patient states their goals for this hospitalization and ongoing recovery are:: to return home. CMS Medicare.gov Compare Post Acute Care list provided to:: Patient Choice offered to / list presented to : Patient  Expected Discharge Plan and Services Expected Discharge Plan: Home/Self Care In-house Referral: NA Discharge Planning Services: CM Consult Post Acute Care Choice: [place] arrangements for the past 2 months: Single Family Home Expected Discharge Date: 01/21/20               DME Arranged: N/A DME Agency: NA       HH Arranged: Refused HH     Prior Living Arrangements/Services Living arrangements for the past 2 months: Single Family Home Lives with:: Self Patient language and need for interpreter reviewed:: Yes Do you feel safe going back to the place where you live?: Yes      Need for Family  Participation in Patient Care: Yes (Comment) Care giver support system in place?: Yes (comment) Current home services: DME (patient declined services-still drives not homebound-has rolling walker in the car) Criminal Activity/Legal Involvement Pertinent to Current Situation/Hospitalization: No - Comment as needed  Activities of Daily Living Home Assistive Devices/Equipment: Cane (specify quad or straight), Eyeglasses, Walker (specify type) ADL Screening (condition at time of admission) Patient's cognitive ability adequate to safely complete daily activities?: Yes Is the patient deaf or have difficulty hearing?: No Does the patient have difficulty seeing, even when wearing glasses/contacts?: No Does the patient have difficulty concentrating, remembering, or making decisions?: No Patient able to express need for assistance with ADLs?: Yes Does the patient have difficulty dressing or bathing?: Yes Independently performs ADLs?: Yes (appropriate for developmental age) Does the patient have difficulty walking or climbing stairs?: Yes Weakness of Legs: Right Weakness of Arms/Hands: None   Emotional Assessment Appearance:: Appears stated age Attitude/Demeanor/Rapport: Engaged Affect (typically observed): Appropriate Orientation: : Oriented to Situation, Oriented to  Time, Oriented to Place, Oriented to Self Alcohol / Substance Use: Not Applicable Psych Involvement: No (comment)  Admission diagnosis:  Limb ischemia [I99.8] Critical limb ischemia with history of revascularization of same extremity [I99.8, Z95.9] Patient Active Problem List   Diagnosis Date Noted  . Critical limb ischemia with history of revascularization of same extremity 01/18/2020  . Critical lower limb ischemia 08/31/2019  . PAD (peripheral artery disease) ([place]) 07/23/2019  .  Ischemia of right lower extremity 01/09/2018  . Critical ischemia of foot 01/09/2018  . Atrial fibrillation ([place]) 07/04/2014  . Atrial  fibrillation with rapid ventricular response ([place])   . Leukocytosis 07/02/2014  . Paroxysmal atrial fibrillation - with RVR 07/02/2014  . Elevated troponin 07/02/2014  . Renal insufficiency 07/02/2014  . Essential hypertension 07/02/2014  . Hypotension 07/02/2014  . CAD (coronary artery disease) 07/02/2014  . Cough 07/02/2014  . Hyperlipemia 07/02/2014  . Atrial fibrillation with RVR ([place]) 07/02/2014  . SIRS (systemic inflammatory response syndrome) ([place]) 07/02/2014  . Diabetes mellitus type 2, controlled ([place]) 07/02/2014   PCP:  [place]:   [employer], [place]. [place] [address] Phone: [phone] Fax: [phone]  Readmission Risk Interventions Readmission Risk Prevention Plan 01/21/2020 07/26/2019  Post Dischage Appt - Complete  Medication Screening - Complete  Transportation Screening Complete Complete  HRI or Home Care Consult Complete -  Social Work Consult for Recovery Care Planning/Counseling Complete -  Palliative Care Screening Not Applicable -  Medication Review Press photographer) Complete -  Some recent data might be hidden

## 2020-01-21 NOTE — Progress Notes (Signed)
Dr Christopher's consult note reviewed. Telemetry today shows patient is maintaining SR. He is on amio 100mg  bid and lopressor 50mg  bid, eliquis for stroke prevention. BP's look fine. No further cardiology recs at this time, he is followed by cardiology at the [facility] so we will not arrange f/u, we will sign off   Carlyle Dolly MD

## 2020-01-21 NOTE — Discharge Instructions (Signed)
[facility]  Discharge Instructions  Lower Extremity Angiogram; Angioplasty/Stenting  Please refer to the following instructions for your post-procedure care. Your surgeon or physician assistant will discuss any changes with you.  Activity  Avoid lifting more than 8 pounds (1 gallons of milk) for 5 days after your procedure. You may walk as much as you can tolerate. It's OK to drive after 72 hours.  Bathing/Showering  You may shower the day after your procedure. If you have a bandage, you may remove it at 24- 48 hours. Clean your incision site with mild soap and water. Pat the area dry with a clean towel.  Diet  Resume your pre-procedure diet. There are no special food restrictions following this procedure. All patients with peripheral vascular disease should follow a low fat/low cholesterol diet. In order to heal from your surgery, it is CRITICAL to get adequate nutrition. Your body requires vitamins, minerals, and protein. Vegetables are the best source of vitamins and minerals. Vegetables also provide the perfect balance of protein. Processed food has little nutritional value, so try to avoid this.  Medications  Resume taking all of your medications unless your doctor tells you not to. If your incision is causing pain, you may take over-the-counter pain relievers such as acetaminophen (Tylenol)  Follow Up  Follow up will be arranged at the time of your procedure. You may have an office visit scheduled or may be scheduled for surgery. Ask your surgeon if you have any questions.  Please call us immediately for any of the following conditions: .Severe or worsening pain your legs or feet at rest or with walking. .Increased pain, redness, drainage at your groin puncture site. .Fever of 101 degrees or higher. .If you have any mild or slow bleeding from your puncture site: lie down, apply firm constant pressure over the area with a piece of gauze or a  clean wash cloth for 30 minutes- no peeking!, call 911 right away if you are still bleeding after 30 minutes, or if the bleeding is heavy and unmanageable.  Reduce your risk factors of vascular disease:  . Stop smoking. If you would like help call [facility] at 1-800-QUIT-NOW (667)228-9755) or [place] at (828) 189-2247. . Manage your cholesterol . Maintain a desired weight . Control your diabetes . Keep your blood pressure down .  If you have any questions, please call the office at [phone]     Information on my medicine - ELIQUIS (apixaban)  Why was Eliquis prescribed for you? Eliquis was prescribed for you to reduce the risk of a blood clot forming that can cause a stroke if you have a medical condition called atrial fibrillation (a type of irregular heartbeat).  What do You need to know about Eliquis ? Take your Eliquis TWICE DAILY - one tablet in the morning and one tablet in the evening with or without food. If you have difficulty swallowing the tablet whole please discuss with your pharmacist how to take the medication safely.  Take Eliquis exactly as prescribed by your doctor and DO NOT stop taking Eliquis without talking to the doctor who prescribed the medication.  Stopping may increase your risk of developing a stroke.  Refill your prescription before you run out.  After discharge, you should have regular check-up appointments with your healthcare provider that is prescribing your Eliquis.  In the future your dose may need to be changed if your kidney function or weight changes by a significant amount or as you get  older.  What do you do if you miss a dose? If you miss a dose, take it as soon as you remember on the same day and resume taking twice daily.  Do not take more than one dose of ELIQUIS at the same time to make up a missed dose.  Important Safety Information A possible side effect of Eliquis is bleeding. You should call your healthcare provider  right away if you experience any of the following: ? Bleeding from an injury or your nose that does not stop. ? Unusual colored urine (red or dark brown) or unusual colored stools (red or black). ? Unusual bruising for unknown reasons. ? A serious fall or if you hit your head (even if there is no bleeding).  Some medicines may interact with Eliquis and might increase your risk of bleeding or clotting while on Eliquis. To help avoid this, consult your healthcare provider or pharmacist prior to using any new prescription or non-prescription medications, including herbals, vitamins, non-steroidal anti-inflammatory drugs (NSAIDs) and supplements.  This website has more information on Eliquis (apixaban): http://www.eliquis.com/eliquis/home

## 2020-01-21 NOTE — Progress Notes (Signed)
Discharge instructions provided to patient. Medications, care of groin site, and follow-up discussed. IV's removed. Patient to be escorted home by significant other.

## 2020-01-21 NOTE — Progress Notes (Signed)
Mobility Specialist - Progress Note   01/21/20 1121  Mobility  Activity Ambulated in hall  Level of Assistance Modified independent, requires aide device or extra time  Assistive Device Front wheel walker  Distance Ambulated (ft) 130 ft  Mobility Response Tolerated fair  Mobility performed by Mobility specialist  $Mobility charge 1 Mobility    Pre-mobility: 75 HR, 135/52 BP, 97% SpO2 During mobility: 89 HR, 93% SpO2 Post-mobility: 74 HR, 157/50 BP, 99% SpO2  Pt ambulated on 2 L/min of O2. He said he typically feels out of breath while ambulating, but did not feel that way this time. He c/o R calf cramping that worsened during ambulation.   Pricilla Handler Mobility Specialist Mobility Specialist Phone: [phone]

## 2020-01-21 NOTE — Progress Notes (Signed)
[place] for transition from heparin to Eliquis Indication: atrial fibrillation  Allergies  Allergen Reactions  . Oxycodone Nausea Only  . Statins Other (See Comments)    Muscle and Bone pain Muscle and Bone pain  . Donepezil Other (See Comments)    [facility]  . Imipramine Other (See Comments)    [facility]  . Oxybutynin Chloride Swelling and Other (See Comments)    [facility]    Patient Measurements: Height: 5\' 9"  (175.3 cm) (84.8kg) Weight: 84.8 kg (187 lb) (per last visit) IBW/kg (Calculated) : 70.7  Vital Signs: Temp: 98.1 F (36.7 C) (07/31 0834) Temp Source: Oral (07/31 0834) BP: 138/60 (07/31 0834) Pulse Rate: 72 (07/31 0834)  Labs: Recent Labs    01/18/20 0858 01/18/20 1511 01/19/20 0547 01/19/20 0547 01/19/20 0819 01/19/20 0819 01/19/20 2124 01/20/20 0426 01/21/20 0207  HGB 13.9   < > 10.9*   < > 11.5*   < >  --  10.4* 9.8*  HCT 41.0   < > 33.7*   < > 35.6*  --   --  32.3* 30.9*  PLT  --    < > 145*   < > 153  --   --  164 161  HEPARINUNFRC  --    < >  --    < > 0.19*   < > 0.36 0.21* 0.29*  CREATININE 1.70*  --  1.47*  --   --   --   --   --   --    < > = values in this interval not displayed.    Estimated Creatinine Clearance: 40.7 mL/min (A) (by C-G formula based on SCr of 1.47 mg/dL (H)).   Medical History: Past Medical History:  Diagnosis Date  . Arthritis    "knees, elbows" (03/18/2018)  . Chronic edema    a. Chronic RLE edema.  Marland Kitchen COPD (chronic obstructive pulmonary disease) ([place])   . Coronary artery disease    a. MI s/p balloon 1996, details unclear.  Marland Kitchen Dysrhythmia    PROXIMAL MARGIN FIBRILATION  . GERD (gastroesophageal reflux disease)   . Hyperlipidemia   . Hypertension   . Myocardial infarction ([place]) 1996  . PAD (peripheral artery disease) ([place])    a. s/p stenting 08/2012, 02/2013.   Marland Kitchen PAF (paroxysmal atrial fibrillation) ([place])   . Sleep apnea    "have mask; have to start wearing it" (03/18/2018)  .  Stroke [facility]) ~ 2014   denies residual on 03/18/2018  . Thrombosis of lower extremity    a. Listed on patient's medical bracelet - at [place].  Marland Kitchen Type II diabetes mellitus (HCC)     Medications:  Plavix  Aspirin  Assessment: 79 year old male with an extensive history of PVD, multiple prior stents, and presenting with right leg pain.   Patient states he last took Eliquis on 7/23 and Plavix on 7/20 as instructed prior to colonoscopy on 7/26. Per colonoscopy note, instructed to continue holding after polypectomy for additional week.   Patient started on heparin 7/28. Patient is now s/p R leg arteriogram 7/28 PM and catheter-directed thrombolysis.  S/p arthrectomy/angioplasty on 01/19/20. Nurse noted patient experienced and episode of Afib on 7/30 and cardiology was consulted. Patient is being transitioned from heparin to Eliquis with the indication of Afib. Patient is currently a candidate for 5mg  BID therapy, but patient will need to monitor his serum creatinine as he is close to meeting 2 of the criteria for  dose reduciton.   Patient  Goal of Therapy:  Monitor platelets by anticoagulation protocol: Yes   Plan:  Discontinue heparin per physician orders  Start Eliquis 5mg  BID for the treatment of Afib per physician orders  Monitor for signs and symptoms of bleeding  Monitor renal function for potential dose reduction  Cephus Slater, PharmD, [facility] Resident [phone] 01/21/2020 8:46 AM

## 2020-02-16 ENCOUNTER — Other Ambulatory Visit: Payer: Self-pay

## 2020-02-16 DIAGNOSIS — I739 Peripheral vascular disease, unspecified: Secondary | ICD-10-CM

## 2020-02-16 DIAGNOSIS — I998 Other disorder of circulatory system: Secondary | ICD-10-CM

## 2020-02-28 ENCOUNTER — Ambulatory Visit (INDEPENDENT_AMBULATORY_CARE_PROVIDER_SITE_OTHER): Payer: No Typology Code available for payment source | Admitting: Vascular Surgery

## 2020-02-28 ENCOUNTER — Encounter: Payer: Self-pay | Admitting: Vascular Surgery

## 2020-02-28 ENCOUNTER — Ambulatory Visit (HOSPITAL_COMMUNITY)
Admission: RE | Admit: 2020-02-28 | Discharge: 2020-02-28 | Disposition: A | Discharge status: Home / Self Care | Payer: Medicare Other | Source: Ambulatory Visit | Attending: Vascular Surgery | Admitting: Vascular Surgery

## 2020-02-28 ENCOUNTER — Other Ambulatory Visit: Payer: Self-pay

## 2020-02-28 ENCOUNTER — Ambulatory Visit (INDEPENDENT_AMBULATORY_CARE_PROVIDER_SITE_OTHER)
Admission: RE | Admit: 2020-02-28 | Discharge: 2020-02-28 | Disposition: A | Discharge status: Home / Self Care | Payer: Medicare Other | Source: Ambulatory Visit | Attending: Vascular Surgery | Admitting: Vascular Surgery

## 2020-02-28 VITALS — BP 152/64 | HR 53 | Resp 16 | Ht 69.0 in | Wt 180.0 lb

## 2020-02-28 DIAGNOSIS — I998 Other disorder of circulatory system: Secondary | ICD-10-CM | POA: Diagnosis not present

## 2020-02-28 DIAGNOSIS — I70229 Atherosclerosis of native arteries of extremities with rest pain, unspecified extremity: Secondary | ICD-10-CM

## 2020-02-28 DIAGNOSIS — I739 Peripheral vascular disease, unspecified: Secondary | ICD-10-CM | POA: Diagnosis not present

## 2020-02-28 NOTE — Progress Notes (Signed)
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 male who presents for hospital follow-up after he recently presented with acute on chronic ischemia of the right lower extremity.  He has a very complex history and records show in 2019 he had acute limb ischemia of the right leg that required thrombectomy of his tibials and a right common femoral to below-knee popliteal bypass by Dr. Bridgett Larsson with propaten.  Ultimately his bypass later occluded in February 2021 and he had stent of his right TP trunk up through the bypass with Viabahn's by Dr. [name].  He then reoccluded this and in March 2021 had thrombolysis of the stented bypass and this required proximal stent of his bypass with Eluvia as well as the distal stent into the TP trunk with a Tigris again by Dr. [name].  Apparently had some interim work done on the right leg at the [place] as well.  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  He has remained on Plavix and Eliquis.  During his most recent event had come off his anticoagulation for colonoscopy when he presented in July with acute on chronic limb ischcemia.  No specific complaints today.  Feels the leg is doing quite well.   Past Medical History:  Diagnosis Date  . Arthritis    "knees, elbows" (03/18/2018)  . Chronic edema    a. Chronic RLE edema.  Marland Kitchen COPD (chronic obstructive pulmonary disease) ([place])   . Coronary artery disease    a. MI s/p balloon 1996, details unclear.  Marland Kitchen Dysrhythmia    PROXIMAL MARGIN FIBRILATION  . GERD (gastroesophageal reflux disease)   . Hyperlipidemia   . Hypertension   . Myocardial infarction ([place]) 1996  . PAD (peripheral artery disease) ([place])     a. s/p stenting 08/2012, 02/2013.   Marland Kitchen PAF (paroxysmal atrial fibrillation) ([place])   . Sleep apnea    "have mask; have to start wearing it" (03/18/2018)  . Stroke [facility]) ~ 2014   denies residual on 03/18/2018  . Thrombosis of lower extremity    a. Listed on patient's medical bracelet - at [place].  Marland Kitchen Type II diabetes mellitus ([place])     Past Surgical History:  Procedure Laterality Date  . ABDOMINAL AORTOGRAM W/LOWER EXTREMITY N/A 07/25/2019   Procedure: ABDOMINAL AORTOGRAM W/LOWER EXTREMITY;  Surgeon: Waynetta Sandy, MD;  Location: [place] [facility];  Service: Cardiovascular;  Laterality: N/A;  . ANTERIOR LUMBAR FUSION  1981  . APPENDECTOMY    . APPLICATION OF WOUND VAC Right 01/09/2018   Procedure: APPLICATION OF WOUND VAC ON RIGHT LOWER LEG;  Surgeon: Conrad Crookston, MD;  Location: [place];  Service: Vascular;  Laterality: Right;  . BACK SURGERY    . CATARACT EXTRACTION W/ INTRAOCULAR LENS  IMPLANT, BILATERAL Bilateral   . CORONARY ANGIOPLASTY WITH STENT PLACEMENT     "I've got 1 stent in there" (03/18/2018)  . FASCIOTOMY Right 01/09/2018   Procedure: FASCIOTOMY RIGHT LOWER LEG;  Surgeon: Conrad [place], MD;  Location: [place];  Service: Vascular;  Laterality: Right;  . FASCIOTOMY CLOSURE Right 01/11/2018   Procedure: FASCIOTOMY CLOSURE RIGHT CALF;  Surgeon: Conrad , MD;  Location: [place];  Service: Vascular;  Laterality: Right;  . FEMORAL-POPLITEAL BYPASS GRAFT Right 01/09/2018   Procedure: RIGHT FEMORAL-POPLITEAL ARTERY BYPASS USING PROPATEN 6MM X 80CM VASCULAR GRAFT;  Surgeon: Conrad Spencer, MD;  Location: [place];  Service: Vascular;  Laterality: Right;  . HEMORRHOID SURGERY    . LEFT HEART CATH AND CORONARY ANGIOGRAPHY N/A 03/19/2018   Procedure: LEFT HEART CATH AND CORONARY ANGIOGRAPHY;  Surgeon: Martinique, Peter M, MD;  Location: [place] [facility];  Service: Cardiovascular;  Laterality: N/A;  . LOWER EXTREMITY ANGIOGRAPHY N/A 08/31/2019   Procedure: LOWER EXTREMITY ANGIOGRAPHY;  Surgeon:  [name], MD;  Location: [place] [facility];  Service: Cardiovascular;  Laterality: N/A;  . LOWER EXTREMITY ANGIOGRAPHY Right 01/18/2020   Procedure: LOWER EXTREMITY ANGIOGRAPHY;  Surgeon: [name], MD;  Location: [place] [facility];  Service: Cardiovascular;  Laterality: Right;  . PATCH ANGIOPLASTY Right 01/09/2018   Procedure: PATCH ANGIOPLASTY USING Rueben Bash BIOLOGIC 1CM X 6CM PATCH;  Surgeon: Conrad Wasola, MD;  Location: [place];  Service: Vascular;  Laterality: Right;  . PERIPHERAL VASCULAR ATHERECTOMY Right 01/19/2020   Procedure: PERIPHERAL VASCULAR ATHERECTOMY;  Surgeon: [name], MD;  Location: [place] [facility];  Service: Cardiovascular;  Laterality: Right;  Femoral popliteal and tibioperoneal arteries.  Marland Kitchen PERIPHERAL VASCULAR BALLOON ANGIOPLASTY Right 09/01/2019   Procedure: PERIPHERAL VASCULAR BALLOON ANGIOPLASTY;  Surgeon: Waynetta Sandy, MD;  Location: [place] [facility];  Service: Cardiovascular;  Laterality: Right;  peroneal artery.  Marland Kitchen PERIPHERAL VASCULAR INTERVENTION Right 07/25/2019   Procedure: PERIPHERAL VASCULAR INTERVENTION;  Surgeon: Waynetta Sandy, MD;  Location: [place] [facility];  Service: Cardiovascular;  Laterality: Right;  SFA X 3  . PERIPHERAL VASCULAR INTERVENTION Right 09/01/2019   Procedure: PERIPHERAL VASCULAR INTERVENTION;  Surgeon: Waynetta Sandy, MD;  Location: [place] [facility];  Service: Cardiovascular;  Laterality: Right;  superficial femoral  . PERIPHERAL VASCULAR THROMBECTOMY Right 09/01/2019   Procedure: PERIPHERAL VASCULAR THROMBECTOMY;  Surgeon: Waynetta Sandy, MD;  Location: [place] [facility];  Service: Cardiovascular;  Laterality: Right;  . PERIPHERAL VASCULAR THROMBECTOMY Right 01/18/2020   Procedure: PERIPHERAL VASCULAR THROMBECTOMY;  Surgeon: [name], MD;  Location: [place] [facility];  Service: Cardiovascular;  Laterality: Right;  . PERIPHERAL VASCULAR THROMBECTOMY  N/A 01/19/2020   Procedure: LYSIS RECHECK;  Surgeon: [name], MD;  Location: [place] [facility];  Service: Cardiovascular;  Laterality: N/A;  . POSTERIOR LUMBAR FUSION  1978  . THROMBECTOMY FEMORAL ARTERY Right 01/09/2018   Procedure: THROMBECTOMY RIGHT LOWER LEG;  Surgeon: Conrad Marion, MD;  Location: [place];  Service: Vascular;  Laterality: Right;  . TONSILLECTOMY      Family History  Problem Relation Age of Onset  . CAD Mother   . CAD Brother   . Hypertension Other     SOCIAL HISTORY: Social History   Tobacco Use  . Smoking status: Former Smoker    Packs/day: 3.00    Years: 30.00    Pack years: 90.00    Quit date: 1996    Years since quitting: 25.7  . Smokeless tobacco: Never Used  Substance Use Topics  . Alcohol use: No    Comment: Remote alcohol use    Allergies  Allergen Reactions  . Oxycodone Nausea Only  . Statins Other (See Comments)    Muscle and Bone pain Muscle and Bone pain  . Donepezil Other (See Comments)    [facility]  . Imipramine Other (See Comments)  Per [place]  . Oxybutynin Chloride Swelling and Other (See Comments)    [facility]    Current Outpatient Medications  Medication Sig Dispense Refill  . acetaminophen (TYLENOL) 500 MG tablet Take 500 mg by mouth 2 (two) times daily as needed (for pain or headaches).     Marland Kitchen amiodarone (PACERONE) 100 MG tablet Take 1 tablet (100 mg total) by mouth 2 (two) times daily. 60 tablet 1  . apixaban (ELIQUIS) 5 MG TABS tablet Take 5 mg by mouth 2 (two) times daily.     . Cholecalciferol (VITAMIN D3 PO) Take 1 tablet by mouth daily.    . clopidogrel (PLAVIX) 75 MG tablet Take 1 tablet (75 mg total) by mouth daily. 90 tablet 1  . Cyanocobalamin (VITAMIN B-12 PO) Take 1 tablet by mouth daily.    Marland Kitchen ezetimibe (ZETIA) 10 MG tablet Take 10 mg by mouth every evening.     . Ferrous Sulfate (IRON PO) Take 1 tablet by mouth daily.    . memantine (NAMENDA) 10 MG tablet Take 5 mg by mouth in the morning.     . metoprolol  (LOPRESSOR) 50 MG tablet Take 50 mg by mouth 2 (two) times daily.    . nitroGLYCERIN (NITROSTAT) 0.4 MG SL tablet Place 0.4 mg under the tongue every 5 (five) minutes as needed for chest pain.    . pantoprazole (PROTONIX) 40 MG tablet Take 40 mg by mouth daily.    . ramipril (ALTACE) 10 MG capsule Take 10 mg by mouth 2 (two) times daily.     No current facility-administered medications for this visit.    REVIEW OF SYSTEMS:  [X]  denotes positive finding, [ ]  denotes negative finding Cardiac  Comments:  Chest pain or chest pressure:    Shortness of breath upon exertion:    Short of breath when lying flat:    Irregular heart rhythm:        Vascular    Pain in calf, thigh, or hip brought on by ambulation:    Pain in feet at night that wakes you up from your sleep:     Blood clot in your veins:    Leg swelling:         Pulmonary    Oxygen at home:    Productive cough:     Wheezing:         Neurologic    Sudden weakness in arms or legs:     Sudden numbness in arms or legs:     Sudden onset of difficulty speaking or slurred speech:    Temporary loss of vision in one eye:     Problems with dizziness:         Gastrointestinal    Blood in stool:     Vomited blood:         Genitourinary    Burning when urinating:     Blood in urine:        Psychiatric    Major depression:         Hematologic    Bleeding problems:    Problems with blood clotting too easily:        Skin    Rashes or ulcers:        Constitutional    Fever or chills:      PHYSICAL EXAM: Vitals:   02/28/20 1441  BP: (!) 152/64  Pulse: (!) 53  Resp: 16  SpO2: 98%  Weight: 180 lb (81.6 kg)  Height: 5\' 9"  (1.753  m)    GENERAL: The patient is a well-nourished male, in no acute distress. The vital signs are documented above. CARDIAC: There is a regular rate and rhythm.  VASCULAR:  Femoral pulses palpable bilaterally Right PT very brisk by doppler, right DP signal as well No tissue loss right  foot Right foot motor and sensory intact PULMONARY: There is good air exchange bilaterally without wheezing or rales. ABDOMEN: Soft and non-tender with normal pitched bowel sounds.     DATA:   Right lower extremity arterial duplex today shows no focal high-grade recurrent stenosis.  ABIs today are 1.02 on the right biphasic  Assessment/Plan:  79 year old male with very complex revascularization history in the right lower extremity as noted above in the HPI.  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 [name] was occluded.  Most recent underwent thrombolysis and subsequent laser arthrectomy to salvage the bypass.  Today he has very brisk posterior tibial and dorsalis pedis signals.  There is no recurrent stenosis noted on duplex today with normal ABI's.  Foot overall looks very good.  Discussed the importance of staying on Eliquis and Plavix from my standpoint.  Given he has had so many issues with bypass patency, I recommended short interval follow-up in 3 months with arterial duplex and ABIs.   [name], MD [facility] [place] Office: [phone]

## 2020-03-01 ENCOUNTER — Other Ambulatory Visit: Payer: Self-pay | Admitting: *Deleted

## 2020-03-01 DIAGNOSIS — I739 Peripheral vascular disease, unspecified: Secondary | ICD-10-CM

## 2020-03-23 ENCOUNTER — Ambulatory Visit
Admission: RE | Admit: 2020-03-23 | Discharge: 2020-03-23 | Disposition: A | Discharge status: Home / Self Care | Payer: Self-pay | Source: Ambulatory Visit | Attending: Radiation Oncology | Admitting: Radiation Oncology

## 2020-03-23 ENCOUNTER — Other Ambulatory Visit: Payer: Self-pay | Admitting: Radiation Oncology

## 2020-03-23 DIAGNOSIS — C3490 Malignant neoplasm of unspecified part of unspecified bronchus or lung: Secondary | ICD-10-CM

## 2020-03-28 ENCOUNTER — Ambulatory Visit
Admission: RE | Admit: 2020-03-28 | Discharge: 2020-03-28 | Disposition: A | Discharge status: Home / Self Care | Payer: Self-pay | Source: Ambulatory Visit | Attending: Radiation Oncology | Admitting: Radiation Oncology

## 2020-03-28 ENCOUNTER — Other Ambulatory Visit: Payer: Self-pay | Admitting: Radiation Oncology

## 2020-03-28 DIAGNOSIS — C3490 Malignant neoplasm of unspecified part of unspecified bronchus or lung: Secondary | ICD-10-CM

## 2020-04-03 ENCOUNTER — Ambulatory Visit
Admission: RE | Admit: 2020-04-03 | Discharge: 2020-04-03 | Disposition: A | Discharge status: Home / Self Care | Payer: No Typology Code available for payment source | Source: Ambulatory Visit | Attending: Radiation Oncology | Admitting: Radiation Oncology

## 2020-04-03 ENCOUNTER — Other Ambulatory Visit: Payer: Self-pay

## 2020-04-03 ENCOUNTER — Encounter: Payer: Self-pay | Admitting: Radiation Oncology

## 2020-04-03 DIAGNOSIS — I739 Peripheral vascular disease, unspecified: Secondary | ICD-10-CM | POA: Diagnosis not present

## 2020-04-03 DIAGNOSIS — I252 Old myocardial infarction: Secondary | ICD-10-CM | POA: Insufficient documentation

## 2020-04-03 DIAGNOSIS — I480 Paroxysmal atrial fibrillation: Secondary | ICD-10-CM | POA: Diagnosis not present

## 2020-04-03 DIAGNOSIS — E119 Type 2 diabetes mellitus without complications: Secondary | ICD-10-CM | POA: Insufficient documentation

## 2020-04-03 DIAGNOSIS — J449 Chronic obstructive pulmonary disease, unspecified: Secondary | ICD-10-CM | POA: Insufficient documentation

## 2020-04-03 DIAGNOSIS — I10 Essential (primary) hypertension: Secondary | ICD-10-CM | POA: Insufficient documentation

## 2020-04-03 DIAGNOSIS — Z86718 Personal history of other venous thrombosis and embolism: Secondary | ICD-10-CM | POA: Diagnosis not present

## 2020-04-03 DIAGNOSIS — Z7901 Long term (current) use of anticoagulants: Secondary | ICD-10-CM | POA: Insufficient documentation

## 2020-04-03 DIAGNOSIS — R918 Other nonspecific abnormal finding of lung field: Secondary | ICD-10-CM | POA: Insufficient documentation

## 2020-04-03 DIAGNOSIS — G4730 Sleep apnea, unspecified: Secondary | ICD-10-CM | POA: Diagnosis not present

## 2020-04-03 DIAGNOSIS — M129 Arthropathy, unspecified: Secondary | ICD-10-CM | POA: Insufficient documentation

## 2020-04-03 DIAGNOSIS — Z8673 Personal history of transient ischemic attack (TIA), and cerebral infarction without residual deficits: Secondary | ICD-10-CM | POA: Diagnosis not present

## 2020-04-03 DIAGNOSIS — K219 Gastro-esophageal reflux disease without esophagitis: Secondary | ICD-10-CM | POA: Diagnosis not present

## 2020-04-03 DIAGNOSIS — Z87891 Personal history of nicotine dependence: Secondary | ICD-10-CM | POA: Diagnosis not present

## 2020-04-03 DIAGNOSIS — E785 Hyperlipidemia, unspecified: Secondary | ICD-10-CM | POA: Diagnosis not present

## 2020-04-03 DIAGNOSIS — C3490 Malignant neoplasm of unspecified part of unspecified bronchus or lung: Secondary | ICD-10-CM

## 2020-04-03 DIAGNOSIS — I2510 Atherosclerotic heart disease of native coronary artery without angina pectoris: Secondary | ICD-10-CM | POA: Insufficient documentation

## 2020-04-03 NOTE — Progress Notes (Signed)
Thoracic Location of Tumor / Histology: RLL Lung nodule  Patient presented with reports of chronic dyspnea with rest and exertion which has gotten worse over the past year.  He has occasional productive cough of clear "slimey" stuff.  Never green, yellow, or bloody.  CT Chest 02/22/2020: There is a 10 x 8 x 8 mm irregular nodular solid round area which is within the right lower lobe along the paraspinal region posterior to the pulmonary veins.  Malignancy would not be excluded.  No thoracic adenopathy.  Chest xray 01/27/2020: Possible 0.7 cm RUL nodule.  Chest CT recommended.   Biopsies of   Tobacco/Marijuana/Snuff/ETOH use: Former smoker  Past/Anticipated interventions by pulmonary, if any: Dr. Deboraha Sprang 03/13/2020 -Solitary pulmonary nodule.  Suspicious for malignancy, not ammenable to bronchoscopic biopsy and likely not to IR for CT-guided biopsy either. -Recommended 3 month follow-up CT. -Also recommended he be evaluated by rad onc for consideration of empiric therapy to which he agreed; consult order placed.  Will also have him be evaluated by thoracic surgery although unlikely he is a surgical candidate.   Past/Anticipated interventions by cardiothoracic surgery, if any:    Past/Anticipated interventions by medical oncology, if any:  Signs/Symptoms  Weight changes, if any: 4 or 5 pounds  Respiratory complaints, if any: Patient reports sob with activity and at rest  Hemoptysis, if any: Denies   Pain issues, if any:  Patient reports pain in his back and rt leg  SAFETY ISSUES:  Prior radiation?  None  Pacemaker/ICD? No  Possible current pregnancy? n/a  Is the patient on methotrexate? NO  Current Complaints / other details:   None Vitals:   04/03/20 1432  BP: (!) 155/48  Pulse: (!) 58  Resp: 18  Temp: (!) 97.4 F (36.3 C)  TempSrc: Temporal  SpO2: 98%  Weight: 80.5 kg  Height: 5\' 9"  (1.753 m)

## 2020-04-03 NOTE — Progress Notes (Signed)
Radiation Oncology         ([phone]) [phone] ________________________________  Name: Donald Gordon        MRN: [id number]  Date of Service: 04/03/2020 DOB: 1940-07-19  [place]:[id number], [place]   DIAGNOSIS: There were no encounter diagnoses.   HISTORY OF PRESENT ILLNESS: Donald Gordon is a 79 y.o. male seen at the request of Dr. 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 The patient has a history of COPD, and recent CT scan on 02/22/2020 was performed following a chest x-ray that had shown a possible nodule. The CT showed the area in the right lower lobe measuring 10 x 8 x 8 mm, along the paraspinal region posterior to the posterior pulmonary veins. No evidence of adenopathy was identified. Pulmonary medicine met with the patient at the [place], Dr. Deboraha Sprang recommended following this area as well as the possibility of meeting with [place] to discuss putative stereotactic body radiotherapy. He is not a good candidate for bronchoscopy or CT-guided biopsy. He is seen today to discuss options of treatment.     PREVIOUS RADIATION THERAPY: No   PAST MEDICAL HISTORY:  Past Medical History:  Diagnosis Date  . Arthritis    "knees, elbows" (03/18/2018)  . Chronic edema    a. Chronic RLE edema.  Marland Kitchen COPD (chronic obstructive pulmonary disease) ([place])   . Coronary artery disease    a. MI s/p balloon 1996, details unclear.  Marland Kitchen Dysrhythmia    PROXIMAL MARGIN FIBRILATION  . GERD (gastroesophageal reflux disease)   . Hyperlipidemia   . Hypertension   . Myocardial infarction ([place]) 1996  . PAD (peripheral artery disease) ([place])    a. s/p stenting 08/2012, 02/2013.   Marland Kitchen PAF (paroxysmal atrial fibrillation) ([place])   . Sleep apnea    "have mask; have to start wearing it" (03/18/2018)  . Stroke [facility]) ~ 2014   denies residual on 03/18/2018  . Thrombosis of lower extremity    a. Listed on patient's medical bracelet - at [place].  Marland Kitchen Type II  diabetes mellitus ([place])        PAST SURGICAL HISTORY: Past Surgical History:  Procedure Laterality Date  . ABDOMINAL AORTOGRAM W/LOWER EXTREMITY N/A 07/25/2019   Procedure: ABDOMINAL AORTOGRAM W/LOWER EXTREMITY;  Surgeon: Waynetta Sandy, MD;  Location: [place] [facility];  Service: Cardiovascular;  Laterality: N/A;  . ANTERIOR LUMBAR FUSION  1981  . APPENDECTOMY    . APPLICATION OF WOUND VAC Right 01/09/2018   Procedure: APPLICATION OF WOUND VAC ON RIGHT LOWER LEG;  Surgeon: Conrad West Hills, MD;  Location: [place];  Service: Vascular;  Laterality: Right;  . BACK SURGERY    . CATARACT EXTRACTION W/ INTRAOCULAR LENS  IMPLANT, BILATERAL Bilateral   . CORONARY ANGIOPLASTY WITH STENT PLACEMENT     "I've got 1 stent in there" (03/18/2018)  . FASCIOTOMY Right 01/09/2018   Procedure: FASCIOTOMY RIGHT LOWER LEG;  Surgeon: Conrad Abilene, MD;  Location: [place];  Service: Vascular;  Laterality: Right;  . FASCIOTOMY CLOSURE Right 01/11/2018   Procedure: FASCIOTOMY CLOSURE RIGHT CALF;  Surgeon: Conrad Aumsville, MD;  Location: [place];  Service: Vascular;  Laterality: Right;  . FEMORAL-POPLITEAL BYPASS GRAFT Right 01/09/2018   Procedure: RIGHT FEMORAL-POPLITEAL ARTERY BYPASS USING PROPATEN 6MM X 80CM VASCULAR GRAFT;  Surgeon: Conrad Osmond, MD;  Location: [place];  Service: Vascular;  Laterality: Right;  .  HEMORRHOID SURGERY    . LEFT HEART CATH AND CORONARY ANGIOGRAPHY N/A 03/19/2018   Procedure: LEFT HEART CATH AND CORONARY ANGIOGRAPHY;  Surgeon: Martinique, Peter M, MD;  Location: [place] [facility];  Service: Cardiovascular;  Laterality: N/A;  . LOWER EXTREMITY ANGIOGRAPHY N/A 08/31/2019   Procedure: LOWER EXTREMITY ANGIOGRAPHY;  Surgeon: [name], MD;  Location: [place] [facility];  Service: Cardiovascular;  Laterality: N/A;  . LOWER EXTREMITY ANGIOGRAPHY Right 01/18/2020   Procedure: LOWER EXTREMITY ANGIOGRAPHY;  Surgeon: [name], MD;  Location: [place] [facility];  Service:  Cardiovascular;  Laterality: Right;  . PATCH ANGIOPLASTY Right 01/09/2018   Procedure: PATCH ANGIOPLASTY USING Rueben Bash BIOLOGIC 1CM X 6CM PATCH;  Surgeon: Conrad Cozad, MD;  Location: [place];  Service: Vascular;  Laterality: Right;  . PERIPHERAL VASCULAR ATHERECTOMY Right 01/19/2020   Procedure: PERIPHERAL VASCULAR ATHERECTOMY;  Surgeon: [name], MD;  Location: [place] [facility];  Service: Cardiovascular;  Laterality: Right;  Femoral popliteal and tibioperoneal arteries.  Marland Kitchen PERIPHERAL VASCULAR BALLOON ANGIOPLASTY Right 09/01/2019   Procedure: PERIPHERAL VASCULAR BALLOON ANGIOPLASTY;  Surgeon: Waynetta Sandy, MD;  Location: [place] [facility];  Service: Cardiovascular;  Laterality: Right;  peroneal artery.  Marland Kitchen PERIPHERAL VASCULAR INTERVENTION Right 07/25/2019   Procedure: PERIPHERAL VASCULAR INTERVENTION;  Surgeon: Waynetta Sandy, MD;  Location: [place] [facility];  Service: Cardiovascular;  Laterality: Right;  SFA X 3  . PERIPHERAL VASCULAR INTERVENTION Right 09/01/2019   Procedure: PERIPHERAL VASCULAR INTERVENTION;  Surgeon: Waynetta Sandy, MD;  Location: [place] [facility];  Service: Cardiovascular;  Laterality: Right;  superficial femoral  . PERIPHERAL VASCULAR THROMBECTOMY Right 09/01/2019   Procedure: PERIPHERAL VASCULAR THROMBECTOMY;  Surgeon: Waynetta Sandy, MD;  Location: [place] [facility];  Service: Cardiovascular;  Laterality: Right;  . PERIPHERAL VASCULAR THROMBECTOMY Right 01/18/2020   Procedure: PERIPHERAL VASCULAR THROMBECTOMY;  Surgeon: [name], MD;  Location: [place] [facility];  Service: Cardiovascular;  Laterality: Right;  . PERIPHERAL VASCULAR THROMBECTOMY N/A 01/19/2020   Procedure: LYSIS RECHECK;  Surgeon: [name], MD;  Location: [place] [facility];  Service: Cardiovascular;  Laterality: N/A;  . POSTERIOR LUMBAR FUSION  1978  . THROMBECTOMY FEMORAL ARTERY Right 01/09/2018   Procedure: THROMBECTOMY RIGHT  LOWER LEG;  Surgeon: Conrad Ridge, MD;  Location: [place];  Service: Vascular;  Laterality: Right;  . TONSILLECTOMY       FAMILY HISTORY:  Family History  Problem Relation Age of Onset  . CAD Mother   . CAD Brother   . Hypertension Other      SOCIAL HISTORY:  reports that he quit smoking about 25 years ago. He has a 90.00 pack-year smoking history. He has never used smokeless tobacco. He reports that he does not drink alcohol and does not use drugs.   ALLERGIES: Oxycodone, Statins, Donepezil, Imipramine, and Oxybutynin chloride   MEDICATIONS:  Current Outpatient Medications  Medication Sig Dispense Refill  . amiodarone (PACERONE) 100 MG tablet Take 1 tablet (100 mg total) by mouth 2 (two) times daily. 60 tablet 1  . apixaban (ELIQUIS) 5 MG TABS tablet Take 5 mg by mouth 2 (two) times daily.     . Cholecalciferol (VITAMIN D3 PO) Take 1 tablet by mouth daily.    . clopidogrel (PLAVIX) 75 MG tablet Take 1 tablet (75 mg total) by mouth daily. 90 tablet 1  . Cyanocobalamin (VITAMIN B-12 PO) Take 1 tablet by mouth daily.    Marland Kitchen  ezetimibe (ZETIA) 10 MG tablet Take 10 mg by mouth every evening.     . Ferrous Sulfate (IRON PO) Take 1 tablet by mouth daily.    . memantine (NAMENDA) 10 MG tablet Take 5 mg by mouth in the morning.     . metoprolol (LOPRESSOR) 50 MG tablet Take 50 mg by mouth 2 (two) times daily.    . pantoprazole (PROTONIX) 40 MG tablet Take 40 mg by mouth daily.    . ramipril (ALTACE) 10 MG capsule Take 10 mg by mouth 2 (two) times daily.    Marland Kitchen acetaminophen (TYLENOL) 500 MG tablet Take 500 mg by mouth 2 (two) times daily as needed (for pain or headaches).  (Patient not taking: Reported on 04/03/2020)    . nitroGLYCERIN (NITROSTAT) 0.4 MG SL tablet Place 0.4 mg under the tongue every 5 (five) minutes as needed for chest pain. (Patient not taking: Reported on 04/03/2020)     No current facility-administered medications for this encounter.     REVIEW OF SYSTEMS: On review of  systems, the patient reports that he is doing well overall. He has some mild coughing and clear to white sputum but no hemoptysis or shortness of breath at rest He denies unintended weight changes. He denies any bowel or bladder disturbances, and denies abdominal pain, nausea or vomiting. He denies any new musculoskeletal or joint aches or pains, but does have chronic back pain, and intermittent leg pain since his PVD and complications from this. A complete review of systems is obtained and is otherwise negative.     PHYSICAL EXAM:  Wt Readings from Last 3 Encounters:  04/03/20 177 lb 6 oz (80.5 kg)  02/28/20 180 lb (81.6 kg)  01/18/20 187 lb (84.8 kg)   Temp Readings from Last 3 Encounters:  04/03/20 (!) 97.4 F (36.3 C) (Temporal)  01/21/20 98.1 F (36.7 C) (Oral)  10/07/19 98.2 F (36.8 C)   BP Readings from Last 3 Encounters:  04/03/20 (!) 155/48  02/28/20 (!) 152/64  01/21/20 (!) 157/50   Pulse Readings from Last 3 Encounters:  04/03/20 (!) 58  02/28/20 (!) 53  01/21/20 70   Pain Assessment Pain Score: 6  Pain Loc: Back/10  In general this is a well appearing caucasian male in no acute distress. He's alert and oriented x4 and appropriate throughout the examination. Cardiopulmonary assessment is negative for acute distress and he exhibits normal effort.    ECOG = 1  0 - Asymptomatic (Fully active, able to carry on all predisease activities without restriction)  1 - Symptomatic but completely ambulatory (Restricted in physically strenuous activity but ambulatory and able to carry out work of a light or sedentary nature. For example, light housework, office work)  2 - Symptomatic, <50% in bed during the day (Ambulatory and capable of all self care but unable to carry out any work activities. Up and about more than 50% of waking hours)  3 - Symptomatic, >50% in bed, but not bedbound (Capable of only limited self-care, confined to bed or chair 50% or more of waking  hours)  4 - Bedbound (Completely disabled. Cannot carry on any self-care. Totally confined to bed or chair)  5 - Death   Eustace Pen MM, Creech RH, Tormey DC, et al. [phone]). "Toxicity and response criteria of the [facility]". [place] Oncol. 5 (6): 649-55    LABORATORY DATA:  Lab Results  Component Value Date   WBC 7.0 01/21/2020   HGB 9.8 (L) 01/21/2020  HCT 30.9 (L) 01/21/2020   MCV 99.4 01/21/2020   PLT 161 01/21/2020   Lab Results  Component Value Date   NA 139 01/19/2020   K 4.8 01/19/2020   CL 108 01/19/2020   CO2 21 (L) 01/19/2020   Lab Results  Component Value Date   ALT 32 08/30/2019   AST 30 08/30/2019   ALKPHOS 69 08/30/2019   BILITOT 0.6 08/30/2019      RADIOGRAPHY: No results found.     IMPRESSION/PLAN: 1. 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 Dr. Lisbeth Renshaw discusses the imaging results and the findings consistent with a nodule in the RLL. He discusses the rationale to proceed with a PET scan to further work up the suspicion of an early stage cancer. If his PET scan shows hypermetabolism in the RLL nodule, he would be a candidate for treatment. We reviewed that ideally a biopsy could be performed to confirm this suspicion, but pulmonary feels that a biopsy is riskier than proceeding definitively with treatment. Given he is not a candidate for biopsy, he does not appear to be a candidate for surgery. Rather, we discussed the options of stereotactic body radiotherapy (SBRT). We discussed the risks, benefits, short, and long term effects of radiotherapy, and the patient is interested in proceeding. Dr. Lisbeth Renshaw discusses the delivery and logistics of radiotherapy and anticipates a course of 3-5 fractions radiotherapy. We will proceed with simulation once PET scan has been performed. We also reviewed consent forms to keep the process moving forward. Written consent is obtained and placed in the chart, a copy  was provided to the patient.  In a visit lasting 60 minutes, greater than 50% of the time was spent face to face discussing the patient's condition, in preparation for the discussion, and coordinating the patient's care.   The above documentation reflects my direct findings during this shared patient visit. Please see the separate note by Dr. Lisbeth Renshaw on this date for the remainder of the patient's plan of care.    Carola Rhine, PAC

## 2020-04-05 NOTE — Progress Notes (Signed)
Addendum: note was signed prior to associating the diagnosis in the HPI: Malignant neoplasm of unspecified part of unspecified bronchus or lung ([place]) - Plan: NM PET Image Initial (PI) Skull Base To Thigh     Carola Rhine, PAC

## 2020-04-09 ENCOUNTER — Ambulatory Visit: Payer: No Typology Code available for payment source | Admitting: Radiation Oncology

## 2020-04-10 ENCOUNTER — Encounter (HOSPITAL_COMMUNITY)
Admission: RE | Admit: 2020-04-10 | Discharge: 2020-04-10 | Disposition: A | Discharge status: Home / Self Care | Payer: No Typology Code available for payment source | Source: Ambulatory Visit | Attending: Radiation Oncology | Admitting: Radiation Oncology

## 2020-04-10 ENCOUNTER — Other Ambulatory Visit: Payer: Self-pay

## 2020-04-10 DIAGNOSIS — C3490 Malignant neoplasm of unspecified part of unspecified bronchus or lung: Secondary | ICD-10-CM | POA: Insufficient documentation

## 2020-04-10 LAB — GLUCOSE, CAPILLARY: Glucose-Capillary: 126 mg/dL — ABNORMAL HIGH (ref 70–99)

## 2020-04-10 MED ORDER — FLUDEOXYGLUCOSE F - 18 (FDG) INJECTION
9.8300 | Freq: Once | INTRAVENOUS | Status: AC
  Administered 2020-04-10: 9.83 via INTRAVENOUS

## 2020-04-11 ENCOUNTER — Other Ambulatory Visit (HOSPITAL_COMMUNITY): Payer: Self-pay | Admitting: Physician Assistant

## 2020-04-11 ENCOUNTER — Telehealth: Payer: Self-pay | Admitting: Radiation Oncology

## 2020-04-11 NOTE — Telephone Encounter (Signed)
I called the patient to check with him since he had a PET scan yesterday.  His results do not show hypermetabolic activity in the lung nodule that we were seeing him for, but do still measure the 9 x 7 mm lesion in the azygoesophageal recess of the right chest with mildly spiculated margins.  As a reference the SUV is 1.1 with an SUV max of 2.1.  Interestingly he did have hypermetabolic uptake in the anal canal/sphincter complex with an SUV of 10.7 with some mild perianal stranding but no appreciable mass specifically.  I called the patient to find out more information about how he has been doing and if he was having any specific symptoms in the rectal or perianal region.  He states that he had had hemorrhoid surgery more than 8 or 9 years ago but has not had any complaints of rectal pain itching palpable lumps or bumps.  He denies any pelvic pain or fevers.  I will reach out to Dr. Lisbeth Renshaw for his impression of all of this and whether we should have him further evaluated for the perianal findings.

## 2020-04-12 ENCOUNTER — Encounter: Payer: Self-pay | Admitting: Radiation Oncology

## 2020-04-12 ENCOUNTER — Telehealth: Payer: Self-pay | Admitting: *Deleted

## 2020-04-12 ENCOUNTER — Other Ambulatory Visit: Payer: Self-pay | Admitting: Radiation Oncology

## 2020-04-12 ENCOUNTER — Telehealth: Payer: Self-pay | Admitting: Radiation Oncology

## 2020-04-12 DIAGNOSIS — C3490 Malignant neoplasm of unspecified part of unspecified bronchus or lung: Secondary | ICD-10-CM

## 2020-04-12 NOTE — Progress Notes (Signed)
The patient's daughter called me back and we were able to chat about her father's PET scan.  Fortunately everything looks favorable and the lung nodule that was being evaluated, and they are in agreement to repeat CT imaging of the chest in approximately 3 months time.  There had been a scan planned at the [place] but I told her that I thought it would be best if it was performed in cones system so that we could comparatively see the  images in real-time rather than have to wait for records to be mailed.  She was in agreement with this as well.  I did review the findings in the anorectal region, and she is able to tell me that the patient is under the care of a gastroenterologist Dr. Truman Hayward in [facility] at [facility].  Apparently Dr. Truman Hayward was able to remove 2 polyps in the office endoscopy suite last year, but in the summer 2021, removed 2 addit ional polyps in a surgical endoscopy suite at the hospital setting, she was not aware of any cancer diagnoses, but he was to be followed byGastroenterology due to this finding. Apparently the patient is having some constipation now from his iron. I've asked that she contact Dr. Marguerita Beards office for a records release to be filled out, and then I had also reached out to Dr. Marcello Moores not knowing that he was already established. We will follow up  with these results and she is aware that further imaging and work out maybe indicated based on these results.

## 2020-04-12 NOTE — Telephone Encounter (Signed)
Left the patient a message letting him know that we are cancelling his ct simulation that is scheduled for 10/22.  He will receive a phone call from the PA later this evening with further details of what is next for him.  I left my call back number in case he has further questions or concerns.  Gloriajean Dell. Leonie Green, BSN

## 2020-04-12 NOTE — Telephone Encounter (Signed)
I called the patient to review his PET scan results and that Dr. Lisbeth Renshaw has recommended follow up CT in 3 months time to re-evaluate his lung nodule rather than proceed with SBRT now. Regarding his anorectal findings on PET, I've reached out to one of the colorectal specialists to see how we should evaluate this finding. I've asked him to call me back on the message I had to leave on his daughter [name]'s voicemail.

## 2020-04-13 ENCOUNTER — Ambulatory Visit: Payer: No Typology Code available for payment source | Admitting: Radiation Oncology

## 2020-04-19 ENCOUNTER — Telehealth: Payer: Self-pay | Admitting: *Deleted

## 2020-04-19 NOTE — Telephone Encounter (Signed)
Left a message for the patient's daughter letting her know we were able to see Dr. Gus Puma notes in epic and we are awaiting return call from his office.  Left call back number in case she has further questions or concerns.    Gloriajean Dell. Leonie Green, BSN

## 2020-04-23 ENCOUNTER — Telehealth: Payer: Self-pay | Admitting: *Deleted

## 2020-04-23 ENCOUNTER — Ambulatory Visit: Payer: No Typology Code available for payment source | Admitting: Radiation Oncology

## 2020-04-23 NOTE — Telephone Encounter (Signed)
Spoke with the patient's daughter to let her know we received her message and we are awaiting return call from Dr. Gus Puma office.  Bryson Ha PA will give her a call once she has more information from Dr. Gus Puma office.  Gloriajean Dell. Leonie Green, BSN

## 2020-04-24 ENCOUNTER — Ambulatory Visit: Payer: No Typology Code available for payment source | Admitting: Radiation Oncology

## 2020-04-25 ENCOUNTER — Ambulatory Visit: Payer: No Typology Code available for payment source | Admitting: Radiation Oncology

## 2020-04-26 ENCOUNTER — Ambulatory Visit: Payer: No Typology Code available for payment source | Admitting: Radiation Oncology

## 2020-04-27 ENCOUNTER — Ambulatory Visit: Payer: No Typology Code available for payment source | Admitting: Radiation Oncology

## 2020-04-30 ENCOUNTER — Ambulatory Visit: Payer: No Typology Code available for payment source | Admitting: Radiation Oncology

## 2020-04-30 DIAGNOSIS — K6289 Other specified diseases of anus and rectum: Secondary | ICD-10-CM | POA: Diagnosis not present

## 2020-04-30 DIAGNOSIS — K644 Residual hemorrhoidal skin tags: Secondary | ICD-10-CM | POA: Diagnosis not present

## 2020-04-30 DIAGNOSIS — Z86010 Personal history of colonic polyps: Secondary | ICD-10-CM | POA: Diagnosis not present

## 2020-04-30 DIAGNOSIS — R948 Abnormal results of function studies of other organs and systems: Secondary | ICD-10-CM | POA: Diagnosis not present

## 2020-04-30 DIAGNOSIS — K602 Anal fissure, unspecified: Secondary | ICD-10-CM | POA: Diagnosis not present

## 2020-05-01 ENCOUNTER — Ambulatory Visit: Payer: No Typology Code available for payment source | Admitting: Radiation Oncology

## 2020-05-02 ENCOUNTER — Ambulatory Visit: Payer: No Typology Code available for payment source | Admitting: Radiation Oncology

## 2020-05-02 ENCOUNTER — Telehealth: Payer: Self-pay | Admitting: Radiation Oncology

## 2020-05-02 DIAGNOSIS — R9389 Abnormal findings on diagnostic imaging of other specified body structures: Secondary | ICD-10-CM

## 2020-05-02 NOTE — Telephone Encounter (Signed)
I called and spoke with the patient's daughter. He was being evaluated due to a lesion in his chest that was felt to be a possible low-grade malignancy.  He did undergo PET scan and this revealed very low level uptake in the lung, but did show extremely hypermetabolic uptake in the anal canal.  We have been trying to get in touch with his gastroenterologist Dr. Marin Comment in [facility], as he had these PET scan findings.  Apparently the patient has undergone recent colonoscopy in July 2021 and had multiple polyps including cecum, proximal ascending and proximal descending colon, proximal sigmoid colon and from what I can tell from the notes at that system, none were malignant.  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  Despite multiple attempts on my part to contact these providers, the only consistent communication I had was with one of the triage nurses.  The patient's daughter is very dissatisfied with the lack of communication from the gastroenterology department at [facility], and would like an alternative opinion.  I had originally reached out to Dr. Marcello Moores not knowing that the patient had previously been connected with gastroenterology, and we would like for her to evaluate and lend a second opinion about whether or not he does need to undergo further work-up or if these PET scan findings are rather felt  to be inflammatory or something that can be followed expectantly.  We do anticipate repeat chest imaging in a few more months to follow-up on the lesion in the lung that he was originally sent to [place] for.

## 2020-05-03 ENCOUNTER — Ambulatory Visit: Payer: No Typology Code available for payment source | Admitting: Radiation Oncology

## 2020-05-04 ENCOUNTER — Encounter: Payer: Self-pay | Admitting: Radiation Oncology

## 2020-05-04 NOTE — Progress Notes (Signed)
Yesterday I was able to connect with Dr. Marin Comment by phone about the patient.  He wanted to stress the importance of the findings from the patient's recent colonoscopy in July, and the need for further resection of multiple large polyps.  In summary this patient has had a history of polyps, but in the summer had a significan complication of his procedure due to the fact that he had to hold his blood thinners.  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  I had initially reviewed his case with colorectal surgery here at [facility] with Dr. Marcello Moores, and she had agreed to see him.  After discussing these recommendations, further information had been obtained and clarified that he was under gastroenterology care.  For several weeks I tried pursuing contact with the team at [facility] would seen him, only until today to have further clarification about the severity of his large colorectal polyps.  Apparently he was seen by one of the midlevel providers in clinic this week, and felt to also have a nodule in the anal region plus or minus risks for future.  The patient's family is very frustrated with the fact that they felt like it took a long time to get evaluated, as well as the fact that he had been told emphatically that he did not have cancer.  For these purposes, I have reconnected with Dr. Marcello Moores about the patient's case and have referred him for formal consultation for second opinion.  Dr. Marin Comment gave additional information on our phone call today to further clarify the fact that he did have concerns that the patient should not just be followed and rather does need endoscopic evaluation.  At some point in time, the patient's primary provider has also interjected  that he should not undergo any further procedures due to cardiovascular concerns.  While this concern is not further clarified, Dr. Marin Comment wants to make sure that it is passed on.

## 2020-05-18 ENCOUNTER — Emergency Department (HOSPITAL_COMMUNITY): Payer: No Typology Code available for payment source

## 2020-05-18 ENCOUNTER — Other Ambulatory Visit: Payer: Self-pay

## 2020-05-18 ENCOUNTER — Encounter (HOSPITAL_COMMUNITY): Payer: Self-pay | Admitting: Emergency Medicine

## 2020-05-18 ENCOUNTER — Inpatient Hospital Stay (HOSPITAL_COMMUNITY)
Admission: EM | Admit: 2020-05-18 | Discharge: 2020-05-21 | DRG: 292 | Disposition: A | Discharge status: Home / Self Care | Payer: No Typology Code available for payment source | Attending: Internal Medicine | Admitting: Internal Medicine

## 2020-05-18 DIAGNOSIS — Z7902 Long term (current) use of antithrombotics/antiplatelets: Secondary | ICD-10-CM

## 2020-05-18 DIAGNOSIS — E782 Mixed hyperlipidemia: Secondary | ICD-10-CM | POA: Diagnosis present

## 2020-05-18 DIAGNOSIS — E1169 Type 2 diabetes mellitus with other specified complication: Secondary | ICD-10-CM | POA: Diagnosis present

## 2020-05-18 DIAGNOSIS — I130 Hypertensive heart and chronic kidney disease with heart failure and stage 1 through stage 4 chronic kidney disease, or unspecified chronic kidney disease: Principal | ICD-10-CM | POA: Diagnosis present

## 2020-05-18 DIAGNOSIS — I480 Paroxysmal atrial fibrillation: Secondary | ICD-10-CM | POA: Diagnosis not present

## 2020-05-18 DIAGNOSIS — I5032 Chronic diastolic (congestive) heart failure: Secondary | ICD-10-CM | POA: Diagnosis present

## 2020-05-18 DIAGNOSIS — N179 Acute kidney failure, unspecified: Secondary | ICD-10-CM | POA: Diagnosis present

## 2020-05-18 DIAGNOSIS — E1122 Type 2 diabetes mellitus with diabetic chronic kidney disease: Secondary | ICD-10-CM | POA: Diagnosis present

## 2020-05-18 DIAGNOSIS — Z8673 Personal history of transient ischemic attack (TIA), and cerebral infarction without residual deficits: Secondary | ICD-10-CM

## 2020-05-18 DIAGNOSIS — R079 Chest pain, unspecified: Secondary | ICD-10-CM | POA: Diagnosis present

## 2020-05-18 DIAGNOSIS — I739 Peripheral vascular disease, unspecified: Secondary | ICD-10-CM | POA: Diagnosis present

## 2020-05-18 DIAGNOSIS — N1831 Chronic kidney disease, stage 3a: Secondary | ICD-10-CM | POA: Diagnosis not present

## 2020-05-18 DIAGNOSIS — Z87891 Personal history of nicotine dependence: Secondary | ICD-10-CM

## 2020-05-18 DIAGNOSIS — Z86718 Personal history of other venous thrombosis and embolism: Secondary | ICD-10-CM

## 2020-05-18 DIAGNOSIS — I4891 Unspecified atrial fibrillation: Secondary | ICD-10-CM

## 2020-05-18 DIAGNOSIS — Z79899 Other long term (current) drug therapy: Secondary | ICD-10-CM

## 2020-05-18 DIAGNOSIS — I2510 Atherosclerotic heart disease of native coronary artery without angina pectoris: Secondary | ICD-10-CM | POA: Diagnosis present

## 2020-05-18 DIAGNOSIS — Z20822 Contact with and (suspected) exposure to covid-19: Secondary | ICD-10-CM | POA: Diagnosis present

## 2020-05-18 DIAGNOSIS — K219 Gastro-esophageal reflux disease without esophagitis: Secondary | ICD-10-CM | POA: Diagnosis present

## 2020-05-18 DIAGNOSIS — Z955 Presence of coronary angioplasty implant and graft: Secondary | ICD-10-CM

## 2020-05-18 DIAGNOSIS — I252 Old myocardial infarction: Secondary | ICD-10-CM

## 2020-05-18 DIAGNOSIS — I25119 Atherosclerotic heart disease of native coronary artery with unspecified angina pectoris: Secondary | ICD-10-CM

## 2020-05-18 DIAGNOSIS — J449 Chronic obstructive pulmonary disease, unspecified: Secondary | ICD-10-CM | POA: Diagnosis present

## 2020-05-18 DIAGNOSIS — I10 Essential (primary) hypertension: Secondary | ICD-10-CM | POA: Diagnosis present

## 2020-05-18 DIAGNOSIS — E1151 Type 2 diabetes mellitus with diabetic peripheral angiopathy without gangrene: Secondary | ICD-10-CM | POA: Diagnosis present

## 2020-05-18 DIAGNOSIS — Z7901 Long term (current) use of anticoagulants: Secondary | ICD-10-CM

## 2020-05-18 DIAGNOSIS — G8929 Other chronic pain: Secondary | ICD-10-CM | POA: Diagnosis present

## 2020-05-18 LAB — TROPONIN I (HIGH SENSITIVITY)
Troponin I (High Sensitivity): 7 ng/L (ref <18)
Troponin I (High Sensitivity): 9 ng/L (ref <18)
Troponin I (High Sensitivity): 9 ng/L (ref <18)

## 2020-05-18 LAB — CBC
HCT: 38.2 % — ABNORMAL LOW (ref 39.0–52.0)
Hemoglobin: 11.9 g/dL — ABNORMAL LOW (ref 13.0–17.0)
MCH: 29.6 pg (ref 26.0–34.0)
MCHC: 31.2 g/dL (ref 30.0–36.0)
MCV: 95 fL (ref 80.0–100.0)
Platelets: 206 10*3/uL (ref 150–400)
RBC: 4.02 MIL/uL — ABNORMAL LOW (ref 4.22–5.81)
RDW: 13.2 % (ref 11.5–15.5)
WBC: 4.6 10*3/uL (ref 4.0–10.5)
nRBC: 0 % (ref 0.0–0.2)

## 2020-05-18 LAB — HEPATIC FUNCTION PANEL
ALT: 83 U/L — ABNORMAL HIGH (ref 0–44)
AST: 61 U/L — ABNORMAL HIGH (ref 15–41)
Albumin: 3.5 g/dL (ref 3.5–5.0)
Alkaline Phosphatase: 77 U/L (ref 38–126)
Bilirubin, Direct: 0.2 mg/dL (ref 0.0–0.2)
Indirect Bilirubin: 0.2 mg/dL — ABNORMAL LOW (ref 0.3–0.9)
Total Bilirubin: 0.4 mg/dL (ref 0.3–1.2)
Total Protein: 6.3 g/dL — ABNORMAL LOW (ref 6.5–8.1)

## 2020-05-18 LAB — BASIC METABOLIC PANEL
Anion gap: 10 (ref 5–15)
BUN: 18 mg/dL (ref 8–23)
CO2: 25 mmol/L (ref 22–32)
Calcium: 9 mg/dL (ref 8.9–10.3)
Chloride: 105 mmol/L (ref 98–111)
Creatinine, Ser: 1.32 mg/dL — ABNORMAL HIGH (ref 0.61–1.24)
GFR, Estimated: 55 mL/min — ABNORMAL LOW (ref >60)
Glucose, Bld: 105 mg/dL — ABNORMAL HIGH (ref 70–99)
Potassium: 4.3 mmol/L (ref 3.5–5.1)
Sodium: 140 mmol/L (ref 135–145)

## 2020-05-18 LAB — RESP PANEL BY RT-PCR (FLU A&B, COVID) ARPGX2
Influenza A by PCR: NEGATIVE
Influenza B by PCR: NEGATIVE
SARS Coronavirus 2 by RT PCR: NEGATIVE

## 2020-05-18 LAB — BRAIN NATRIURETIC PEPTIDE: B Natriuretic Peptide: 377.4 pg/mL — ABNORMAL HIGH (ref 0.0–100.0)

## 2020-05-18 LAB — MAGNESIUM: Magnesium: 1.9 mg/dL (ref 1.7–2.4)

## 2020-05-18 LAB — GLUCOSE, CAPILLARY: Glucose-Capillary: 201 mg/dL — ABNORMAL HIGH (ref 70–99)

## 2020-05-18 MED ORDER — INSULIN ASPART 100 UNIT/ML ~~LOC~~ SOLN
0.0000 [IU] | Freq: Three times a day (TID) | SUBCUTANEOUS | Status: DC
  Administered 2020-05-18: 5 [IU] via SUBCUTANEOUS
  Administered 2020-05-19: 2 [IU] via SUBCUTANEOUS

## 2020-05-18 MED ORDER — FUROSEMIDE 10 MG/ML IJ SOLN
20.0000 mg | Freq: Two times a day (BID) | INTRAMUSCULAR | Status: AC
  Administered 2020-05-18 – 2020-05-19 (×2): 20 mg via INTRAVENOUS
  Filled 2020-05-18 (×2): qty 2

## 2020-05-18 MED ORDER — EZETIMIBE 10 MG PO TABS
10.0000 mg | ORAL_TABLET | Freq: Every evening | ORAL | Status: DC
  Administered 2020-05-18 – 2020-05-20 (×3): 10 mg via ORAL
  Filled 2020-05-18 (×3): qty 1

## 2020-05-18 MED ORDER — HYDROCHLOROTHIAZIDE 12.5 MG PO CAPS
12.5000 mg | ORAL_CAPSULE | Freq: Every day | ORAL | Status: DC
  Administered 2020-05-19 – 2020-05-21 (×3): 12.5 mg via ORAL
  Filled 2020-05-18 (×3): qty 1

## 2020-05-18 MED ORDER — ONDANSETRON HCL 4 MG/2ML IJ SOLN
4.0000 mg | Freq: Four times a day (QID) | INTRAMUSCULAR | Status: DC | PRN
  Administered 2020-05-19: 4 mg via INTRAVENOUS
  Filled 2020-05-18: qty 2

## 2020-05-18 MED ORDER — PANTOPRAZOLE SODIUM 40 MG PO TBEC
40.0000 mg | DELAYED_RELEASE_TABLET | Freq: Every day | ORAL | Status: DC
  Administered 2020-05-19 – 2020-05-21 (×3): 40 mg via ORAL
  Filled 2020-05-18 (×3): qty 1

## 2020-05-18 MED ORDER — AMIODARONE HCL 200 MG PO TABS
200.0000 mg | ORAL_TABLET | Freq: Every day | ORAL | Status: DC
  Administered 2020-05-19 – 2020-05-21 (×3): 200 mg via ORAL
  Filled 2020-05-18 (×3): qty 1

## 2020-05-18 MED ORDER — ACETAMINOPHEN 325 MG PO TABS
650.0000 mg | ORAL_TABLET | ORAL | Status: DC | PRN
  Administered 2020-05-19: 650 mg via ORAL
  Filled 2020-05-18: qty 2

## 2020-05-18 MED ORDER — NITROGLYCERIN 0.4 MG SL SUBL
0.4000 mg | SUBLINGUAL_TABLET | SUBLINGUAL | Status: DC | PRN
  Administered 2020-05-19 (×2): 0.4 mg via SUBLINGUAL
  Filled 2020-05-18 (×2): qty 1

## 2020-05-18 MED ORDER — FINASTERIDE 5 MG PO TABS
5.0000 mg | ORAL_TABLET | Freq: Every day | ORAL | Status: DC
  Administered 2020-05-19 – 2020-05-21 (×3): 5 mg via ORAL
  Filled 2020-05-18 (×3): qty 1

## 2020-05-18 MED ORDER — KETOROLAC TROMETHAMINE 0.5 % OP SOLN
1.0000 [drp] | Freq: Every day | OPHTHALMIC | Status: DC
  Administered 2020-05-18 – 2020-05-21 (×4): 1 [drp] via OPHTHALMIC
  Filled 2020-05-18: qty 5

## 2020-05-18 MED ORDER — METOPROLOL TARTRATE 50 MG PO TABS
50.0000 mg | ORAL_TABLET | Freq: Two times a day (BID) | ORAL | Status: DC
  Administered 2020-05-18 – 2020-05-20 (×4): 50 mg via ORAL
  Filled 2020-05-18 (×6): qty 1

## 2020-05-18 MED ORDER — APIXABAN 5 MG PO TABS
5.0000 mg | ORAL_TABLET | Freq: Two times a day (BID) | ORAL | Status: DC
  Administered 2020-05-18 – 2020-05-21 (×6): 5 mg via ORAL
  Filled 2020-05-18 (×6): qty 1

## 2020-05-18 MED ORDER — MEMANTINE HCL 10 MG PO TABS
10.0000 mg | ORAL_TABLET | Freq: Every morning | ORAL | Status: DC
  Administered 2020-05-19 – 2020-05-21 (×3): 10 mg via ORAL
  Filled 2020-05-18 (×3): qty 1

## 2020-05-18 MED ORDER — RIVASTIGMINE TARTRATE 1.5 MG PO CAPS
6.0000 mg | ORAL_CAPSULE | Freq: Two times a day (BID) | ORAL | Status: DC
  Administered 2020-05-18 – 2020-05-21 (×6): 6 mg via ORAL
  Filled 2020-05-18 (×7): qty 4

## 2020-05-18 MED ORDER — RAMIPRIL 10 MG PO CAPS
10.0000 mg | ORAL_CAPSULE | Freq: Two times a day (BID) | ORAL | Status: DC
  Administered 2020-05-18 – 2020-05-21 (×6): 10 mg via ORAL
  Filled 2020-05-18 (×3): qty 4
  Filled 2020-05-18: qty 1
  Filled 2020-05-18 (×2): qty 4
  Filled 2020-05-18 (×2): qty 1

## 2020-05-18 MED ORDER — CLOPIDOGREL BISULFATE 75 MG PO TABS
75.0000 mg | ORAL_TABLET | Freq: Every day | ORAL | Status: DC
  Administered 2020-05-18 – 2020-05-20 (×3): 75 mg via ORAL
  Filled 2020-05-18 (×3): qty 1

## 2020-05-18 NOTE — ED Notes (Signed)
Dr. Iona Beard - hospitalist at bedside. Will take pt upstairs once evaluation of provider is done. Randall Hiss RN aware.

## 2020-05-18 NOTE — ED Notes (Signed)
Pt AOx4. Denies any chest pain at this time. Not in respiratory distress. [id number].

## 2020-05-18 NOTE — ED Provider Notes (Signed)
[place] EMERGENCY DEPARTMENT Provider Note   CSN: [id number] Arrival date & time: 05/18/20  1330     History Chief Complaint  Patient presents with  . Chest Pain  . Hypotension    Donald Gordon is a 79 y.o. male with a past medical history of chronic right lower extremity edema, COPD, A. fib, stroke, DM 2, CAD, who presents today for evaluation of complaints.  He reports that about a month ago his amiodarone was changed from 100 mg twice a day to 200 mg in the morning.  He states that over the past 10 to 14 days he has been having A. fib episodes every afternoon/night.  He states that the last time he had a A. fib episode was last night, he went into it at about 10 PM.  He was still in it when he woke up at 4 AM, however when he woke up again at 6 AM he was no longer in A. fib.  He states that he can tell when he is in A. fib because he has pain in his left arm and when he feels his pulse that is irregular.  He states that he has chronic chest pain.  He feels like that when he is in A. fib he has no change in his pain in his chest.   He describes the chest pain as gas pain.  He Denies any new back pain.  He denies missing any medications.  No changes in leg swelling.  He reports compliance with his Eliquis.  No history of PE/DVT Eliquis or otherwise.  He reports that he has had increased shortness of breath, primarily as dyspnea with exertion over the past 4 to 5 days.  Chronic cough is unchanged.  He has had 3 Covid shots and no known Covid contacts or fevers.    HPI     Past Medical History:  Diagnosis Date  . Arthritis    "knees, elbows" (03/18/2018)  . Chronic edema    a. Chronic RLE edema.  Marland Kitchen COPD (chronic obstructive pulmonary disease) ([place])   . Coronary artery disease    a. MI s/p balloon 1996, details unclear.  Marland Kitchen Dysrhythmia    PROXIMAL MARGIN FIBRILATION  . GERD (gastroesophageal reflux disease)   . Hyperlipidemia   . Hypertension   .  Myocardial infarction ([place]) 1996  . PAD (peripheral artery disease) ([place])    a. s/p stenting 08/2012, 02/2013.   Marland Kitchen PAF (paroxysmal atrial fibrillation) ([place])   . Sleep apnea    "have mask; have to start wearing it" (03/18/2018)  . Stroke [facility]) ~ 2014   denies residual on 03/18/2018  . Thrombosis of lower extremity    a. Listed on patient's medical bracelet - at [place].  Marland Kitchen Type II diabetes mellitus [facility])     Patient Active Problem List   Diagnosis Date Noted  . Chest pain 05/18/2020  . Mixed diabetic hyperlipidemia associated with type 2 diabetes mellitus ([place]) 05/18/2020  . Chronic kidney disease, stage 3a ([place]) 05/18/2020  . 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 esophagitis 05/18/2020  . Chronic diastolic (congestive) heart failure ([place]) 05/18/2020  . Critical limb ischemia with history of revascularization of same extremity ([place]) 01/18/2020  . Critical lower limb ischemia ([place]) 08/31/2019  . PAD (peripheral artery disease) ([place]) 07/23/2019  . Ischemia of right lower extremity 01/09/2018  . Critical ischemia of foot ([place]) 01/09/2018  . Atrial fibrillation ([place]) 07/04/2014  . Atrial fibrillation with rapid ventricular response ([place])   .  AF (paroxysmal atrial fibrillation) ([place]) 07/02/2014  . Essential hypertension 07/02/2014  . Coronary artery disease involving native coronary artery of native heart 07/02/2014  . Cough 07/02/2014  . Atrial fibrillation with RVR ([place]) 07/02/2014  . Type 2 diabetes mellitus with stage 3a chronic kidney disease, without long-term current use of insulin ([place]) 07/02/2014    Past Surgical History:  Procedure Laterality Date  . ABDOMINAL AORTOGRAM W/LOWER EXTREMITY N/A 07/25/2019   Procedure: ABDOMINAL AORTOGRAM W/LOWER EXTREMITY;  Surgeon: Waynetta Sandy, MD;  Location: [place] CV LAB;  Service: Cardiovascular;  Laterality: N/A;  . ANTERIOR LUMBAR FUSION  1981  . APPENDECTOMY    . APPLICATION OF WOUND VAC Right 01/09/2018   Procedure: APPLICATION OF WOUND VAC ON RIGHT  LOWER LEG;  Surgeon: Conrad Waimanalo, MD;  Location: [place];  Service: Vascular;  Laterality: Right;  . BACK SURGERY    . CATARACT EXTRACTION W/ INTRAOCULAR LENS  IMPLANT, BILATERAL Bilateral   . CORONARY ANGIOPLASTY WITH STENT PLACEMENT     "I've got 1 stent in there" (03/18/2018)  . FASCIOTOMY Right 01/09/2018   Procedure: FASCIOTOMY RIGHT LOWER LEG;  Surgeon: Conrad Grimesland, MD;  Location: [place];  Service: Vascular;  Laterality: Right;  . FASCIOTOMY CLOSURE Right 01/11/2018   Procedure: FASCIOTOMY CLOSURE RIGHT CALF;  Surgeon: Conrad Kismet, MD;  Location: [place];  Service: Vascular;  Laterality: Right;  . FEMORAL-POPLITEAL BYPASS GRAFT Right 01/09/2018   Procedure: RIGHT FEMORAL-POPLITEAL ARTERY BYPASS USING PROPATEN 6MM X 80CM VASCULAR GRAFT;  Surgeon: Conrad , MD;  Location: [place];  Service: Vascular;  Laterality: Right;  . HEMORRHOID SURGERY    . LEFT HEART CATH AND CORONARY ANGIOGRAPHY N/A 03/19/2018   Procedure: LEFT HEART CATH AND CORONARY ANGIOGRAPHY;  Surgeon: Martinique, Peter M, MD;  Location: [place] [facility];  Service: Cardiovascular;  Laterality: N/A;  . LOWER EXTREMITY ANGIOGRAPHY N/A 08/31/2019   Procedure: LOWER EXTREMITY ANGIOGRAPHY;  Surgeon: [name], MD;  Location: [place] CV LAB;  Service: Cardiovascular;  Laterality: N/A;  . LOWER EXTREMITY ANGIOGRAPHY Right 01/18/2020   Procedure: LOWER EXTREMITY ANGIOGRAPHY;  Surgeon: [name], MD;  Location: [place] CV LAB;  Service: Cardiovascular;  Laterality: Right;  . PATCH ANGIOPLASTY Right 01/09/2018   Procedure: PATCH ANGIOPLASTY USING Rueben Bash BIOLOGIC 1CM X 6CM PATCH;  Surgeon: Conrad , MD;  Location: [place];  Service: Vascular;  Laterality: Right;  . PERIPHERAL VASCULAR ATHERECTOMY Right 01/19/2020   Procedure: PERIPHERAL VASCULAR ATHERECTOMY;  Surgeon: [name], MD;  Location: [place] CV LAB;  Service: Cardiovascular;  Laterality: Right;  Femoral popliteal and tibioperoneal  arteries.  Marland Kitchen PERIPHERAL VASCULAR BALLOON ANGIOPLASTY Right 09/01/2019   Procedure: PERIPHERAL VASCULAR BALLOON ANGIOPLASTY;  Surgeon: Waynetta Sandy, MD;  Location: [place] CV LAB;  Service: Cardiovascular;  Laterality: Right;  peroneal artery.  Marland Kitchen PERIPHERAL VASCULAR INTERVENTION Right 07/25/2019   Procedure: PERIPHERAL VASCULAR INTERVENTION;  Surgeon: Waynetta Sandy, MD;  Location: [place] CV LAB;  Service: Cardiovascular;  Laterality: Right;  SFA X 3  . PERIPHERAL VASCULAR INTERVENTION Right 09/01/2019   Procedure: PERIPHERAL VASCULAR INTERVENTION;  Surgeon: Waynetta Sandy, MD;  Location: [place] CV LAB;  Service: Cardiovascular;  Laterality: Right;  superficial femoral  . PERIPHERAL VASCULAR THROMBECTOMY Right 09/01/2019   Procedure: PERIPHERAL VASCULAR THROMBECTOMY;  Surgeon: Waynetta Sandy, MD;  Location: [place] CV LAB;  Service: Cardiovascular;  Laterality: Right;  . PERIPHERAL VASCULAR THROMBECTOMY Right 01/18/2020   Procedure:  PERIPHERAL VASCULAR THROMBECTOMY;  Surgeon: [name], MD;  Location: [place] CV LAB;  Service: Cardiovascular;  Laterality: Right;  . PERIPHERAL VASCULAR THROMBECTOMY N/A 01/19/2020   Procedure: LYSIS RECHECK;  Surgeon: [name], MD;  Location: [place] CV LAB;  Service: Cardiovascular;  Laterality: N/A;  . POSTERIOR LUMBAR FUSION  1978  . THROMBECTOMY FEMORAL ARTERY Right 01/09/2018   Procedure: THROMBECTOMY RIGHT LOWER LEG;  Surgeon: Conrad Ackworth, MD;  Location: [place];  Service: Vascular;  Laterality: Right;  . TONSILLECTOMY         Family History  Problem Relation Age of Onset  . CAD Mother   . CAD Brother   . Hypertension Other     Social History   Tobacco Use  . Smoking status: Former Smoker    Packs/day: 3.00    Years: 30.00    Pack years: 90.00    Quit date: 1996    Years since quitting: 25.9  . Smokeless tobacco: Never Used  Vaping Use  . Vaping Use: Never  used  Substance Use Topics  . Alcohol use: No    Comment: Remote alcohol use  . Drug use: Never    Home Medications Prior to Admission medications   Medication Sig Start Date End Date Taking? Authorizing Provider  acetaminophen (TYLENOL) 500 MG tablet Take 500 mg by mouth 2 (two) times daily as needed (for pain or headaches).    Yes [provider]  amiodarone (PACERONE) 100 MG tablet Take 1 tablet (100 mg total) by mouth 2 (two) times daily. Patient taking differently: Take 200 mg by mouth daily.  01/21/20  Yes Baglia, Corrina, PA-C  apixaban (ELIQUIS) 5 MG TABS tablet Take 5 mg by mouth 2 (two) times daily.    Yes [provider]  Cholecalciferol (VITAMIN D3 PO) Take 1 tablet by mouth daily.   Yes [provider]  clopidogrel (PLAVIX) 75 MG tablet Take 1 tablet (75 mg total) by mouth daily. Patient taking differently: Take 75 mg by mouth at bedtime.  01/14/18  Yes Dagoberto Ligas, PA-C  Cyanocobalamin (VITAMIN B-12 PO) Take 1 tablet by mouth daily.   Yes [provider]  ezetimibe (ZETIA) 10 MG tablet Take 10 mg by mouth every evening.    Yes [provider]  Ferrous Sulfate (IRON PO) Take 1 tablet by mouth daily.   Yes [provider]  finasteride (PROSCAR) 5 MG tablet Take 5 mg by mouth daily.   Yes [provider]  hydrochlorothiazide (MICROZIDE) 12.5 MG capsule Take 12.5 mg by mouth daily.   Yes [provider]  ketorolac (ACULAR) 0.5 % ophthalmic solution Place 1 drop into the right eye daily.  05/16/20  Yes [provider]  memantine (NAMENDA) 10 MG tablet Take 10 mg by mouth in the morning.    Yes [provider]  metoprolol (LOPRESSOR) 50 MG tablet Take 50 mg by mouth 2 (two) times daily.   Yes [provider]  nitroGLYCERIN (NITROSTAT) 0.4 MG SL tablet Place 0.4 mg under the tongue every 5 (five) minutes as needed for chest pain.    Yes [provider]  pantoprazole  (PROTONIX) 40 MG tablet Take 40 mg by mouth daily.   Yes [provider]  ramipril (ALTACE) 10 MG capsule Take 10 mg by mouth 2 (two) times daily.   Yes [provider]  rivastigmine (EXELON) 6 MG capsule Take 6 mg by mouth 2 (two) times daily.   Yes [provider]    Allergies    Oxycodone, Statins, Donepezil, Imipramine, and Oxybutynin chloride  Review of Systems   Review of Systems  Constitutional: Negative for chills, diaphoresis, fatigue and fever.  Eyes: Negative for visual disturbance.  Respiratory: Positive for cough, chest tightness and shortness of breath.   Cardiovascular: Positive for chest pain. Negative for leg swelling.  Gastrointestinal: Negative for abdominal pain, diarrhea, nausea and vomiting.  Musculoskeletal: Negative for back pain and neck pain.  Skin: Negative for color change and wound.  Neurological: Negative for weakness and headaches.  All other systems reviewed and are negative.   Physical Exam Updated Vital Signs BP (!) 180/63 (BP Location: Right Arm)   Pulse (!) 56   Temp 98.3 F (36.8 C) (Oral)   Resp 19   Ht 5' 9.5" (1.765 m)   Wt 77.1 kg   SpO2 95%   BMI 24.74 kg/m   Physical Exam Vitals and nursing note reviewed.  Constitutional:      General: He is not in acute distress.    Appearance: He is well-developed. He is not ill-appearing.  HENT:     Head: Normocephalic and atraumatic.  Eyes:     Conjunctiva/sclera: Conjunctivae normal.  Cardiovascular:     Rate and Rhythm: Normal rate and regular rhythm.     Pulses:          Dorsalis pedis pulses are 1+ on the right side and 2+ on the left side.       Posterior tibial pulses are 1+ on the right side and 2+ on the left side.     Heart sounds: Normal heart sounds. No murmur heard.      Comments: Patient reports normally right leg pulse is weaker than right.  Pulmonary:     Effort: Pulmonary effort is normal. No accessory muscle usage or respiratory distress.      Breath sounds: Normal breath sounds.  Chest:     Chest wall: No tenderness.  Abdominal:     General: Bowel sounds are normal.     Palpations: Abdomen is soft.     Tenderness: There is no abdominal tenderness. There is no guarding.  Musculoskeletal:     Cervical back: Neck supple.     Right lower leg: No tenderness. No edema.     Left lower leg: No tenderness. No edema.  Skin:    General: Skin is warm and dry.  Neurological:     General: No focal deficit present.     Mental Status: He is alert.  Psychiatric:        Mood and Affect: Mood normal.        Behavior: Behavior normal.     ED Results / Procedures / Treatments   Labs (all labs ordered are listed, but only abnormal results are displayed) Labs Reviewed  BASIC METABOLIC PANEL - Abnormal; Notable for the following components:      Result Value   Glucose, Bld 105 (*)    Creatinine, Ser 1.32 (*)    GFR, Estimated 55 (*)    All other components within normal limits  CBC - Abnormal; Notable for the following components:   RBC 4.02 (*)    Hemoglobin 11.9 (*)    HCT 38.2 (*)    All other components within normal limits  HEPATIC FUNCTION PANEL - Abnormal; Notable for the following components:   Total Protein 6.3 (*)    AST 61 (*)    ALT 83 (*)    Indirect Bilirubin  0.2 (*)    All other components within normal limits  BRAIN NATRIURETIC PEPTIDE - Abnormal; Notable for the following components:   B Natriuretic Peptide 377.4 (*)    All other components within normal limits  RESP PANEL BY RT-PCR (FLU A&B, COVID) ARPGX2  MAGNESIUM  LIPID PANEL  HEMOGLOBIN A1C  RENAL FUNCTION PANEL  MAGNESIUM  TROPONIN I (HIGH SENSITIVITY)  TROPONIN I (HIGH SENSITIVITY)    EKG EKG Interpretation  Date/Time:  Friday May 18 2020 14:01:01 EST Ventricular Rate:  57 PR Interval:  164 QRS Duration: 98 QT Interval:  468 QTC Calculation: 455 R Axis:   13 Text Interpretation: Sinus bradycardia Nonspecific T wave abnormality  Abnormal ECG Confirmed by Lacretia Leigh ([id number]) on 05/18/2020 7:12:11 PM   Radiology DG Chest 2 View  Result Date: 05/18/2020 CLINICAL DATA:  Chest pain. EXAM: CHEST - 2 VIEW COMPARISON:  PET-CT 04/10/2020.  Chest x-ray 11/2019. FINDINGS: Mediastinum and hilar structures normal. Heart size normal. Previously identified spiculated nodule in the medial right lung base best identified by prior PET-CT. No acute infiltrate noted. No pleural effusion or pneumothorax. Metallic density small radiopacity noted over the anterior abdomen. Degenerative change in scoliosis thoracic spine. IMPRESSION: 1. Previously identified spiculated nodular density in the medial right lung base best identified by prior PET-CT. 2. No acute abnormality identified. Electronically Signed   By: Marcello Moores  Register   On: 05/18/2020 14:47    Procedures Procedures (including critical care time)  Medications Ordered in ED Medications                                      ED Course  I have reviewed the triage vital signs and the nursing notes.  Pertinent labs & imaging results that were available during my care of the patient were reviewed by me and considered in my medical decision making (see chart for details).    MDM Rules/Calculators/A&P                          Patient is a 79 year old man who presents today for evaluation of chest pain.  He has also been reportedly having episodes of A. fib almost daily.  Here he is not in A. Fib.  CBC shows mild anemia.  BMP without significant acute abnormalities.  Troponin is not elevated, magnesium is normal.  He does have mild transaminitis, chest x-ray shows spiculated mass which is already been evaluated by rad Onc.  Patient reports compliant with his Eliquis, lower suspicion for PE and clinically does not have signs of DVT.  Given patient's elevated risk with chest pain plan to admit due to concern for cardiovascular cause of his symptoms.  I spoke with Dr.  Cyd Silence who will see patient for admission.  Note: Portions of this report may have been transcribed using voice recognition software. Every effort was made to ensure accuracy; however, inadvertent computerized transcription errors may be present  Final Clinical Impression(s) / ED Diagnoses Final diagnoses:  Chest pain, unspecified type  Atrial fibrillation, unspecified type [facility])    Rx / DC Orders ED Discharge Orders    None       Ollen Gross 05/18/20 [id number]    Lacretia Leigh, MD 05/21/20 [phone]

## 2020-05-18 NOTE — H&P (Signed)
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  Patient explains that he has experienced intermittent episodes of chest discomfort for at least the past several months.  He has always attributed these episodes of chest discomfort to episodes of perceived rapid atrial fibrillation.  Patient explains that approximately 4 weeks ago his [place] cardiologist transitioned him from amiodarone 100 mg twice daily to 200 mg daily.  Patient states that since this medication has been changed he has had an increase in frequency of his episodes of chest discomfort.  Patient describes these episodes of chest discomfort as "smothering" in quality, midsternal in location, moderate to severe in intensity and typically occurring at rest while in bed.  Patient states that these episodes typically last between 1 and 2 hours and that they occur nearly nightly.  Patient does complain of associated left upper extremity pain that occurs at the same time of his chest discomfort.  Patient is unable to identify any alleviating or exacerbating factors.  Patient denies any associated shortness of breath.  Patient denies symptoms of frequent heartburn, recent heavy lifting or trauma.  Patient reports that he is compliant with all prescribed medications.   Patient denies drug use.  Patient explains that for the past several weeks these episodes have seemingly increased in frequency.  Patient is decided to present to [facility] emergency department after experiencing an episode this morning from approximately 2 AM to 4 AM.  Upon evaluation in the emergency department, considering patient's extensive list of risk factors and known coronary artery disease the hospitalist group has been called to assess the patient for admission to the hospital.  Review of Systems:   Review of Systems  Respiratory: Positive for shortness of breath.   Cardiovascular: Positive for chest pain and leg swelling.  All other systems reviewed and are negative.   Past Medical History:  Diagnosis Date  . Arthritis    "knees, elbows" (03/18/2018)  . Chronic edema    a. Chronic RLE edema.  Marland Kitchen COPD (chronic obstructive pulmonary disease) ([place])   . Coronary artery disease    a. MI s/p balloon 1996, details unclear.  Marland Kitchen Dysrhythmia    PROXIMAL MARGIN FIBRILATION  . GERD (gastroesophageal reflux disease)   . Hyperlipidemia   . Hypertension   . Myocardial infarction ([place]) 1996  . PAD (peripheral artery disease) ([place])    a. s/p stenting 08/2012, 02/2013.   Marland Kitchen PAF (paroxysmal atrial fibrillation) ([place])   . Sleep apnea    "have mask; have to start wearing it" (03/18/2018)  . Stroke [facility]) ~ 2014   denies residual on 03/18/2018  . Thrombosis of lower extremity    a. Listed on patient's medical bracelet - at [place].  Marland Kitchen Type II diabetes mellitus ([place])     Past Surgical History:  Procedure Laterality Date  . ABDOMINAL AORTOGRAM W/LOWER EXTREMITY N/A 07/25/2019   Procedure: ABDOMINAL AORTOGRAM W/LOWER  EXTREMITY;  Surgeon: Waynetta Sandy, MD;  Location: [place] [facility];  Service: Cardiovascular;  Laterality: N/A;  . ANTERIOR LUMBAR FUSION  1981  . APPENDECTOMY    . APPLICATION OF WOUND VAC Right 01/09/2018   Procedure: APPLICATION OF WOUND VAC ON RIGHT LOWER  LEG;  Surgeon: Conrad Tucumcari, MD;  Location: [place];  Service: Vascular;  Laterality: Right;  . BACK SURGERY    . CATARACT EXTRACTION W/ INTRAOCULAR LENS  IMPLANT, BILATERAL Bilateral   . CORONARY ANGIOPLASTY WITH STENT PLACEMENT     "I've got 1 stent in there" (03/18/2018)  . FASCIOTOMY Right 01/09/2018   Procedure: FASCIOTOMY RIGHT LOWER LEG;  Surgeon: Conrad Martin, MD;  Location: [place];  Service: Vascular;  Laterality: Right;  . FASCIOTOMY CLOSURE Right 01/11/2018   Procedure: FASCIOTOMY CLOSURE RIGHT CALF;  Surgeon: Conrad Parkman, MD;  Location: [place];  Service: Vascular;  Laterality: Right;  . FEMORAL-POPLITEAL BYPASS GRAFT Right 01/09/2018   Procedure: RIGHT FEMORAL-POPLITEAL ARTERY BYPASS USING PROPATEN 6MM X 80CM VASCULAR GRAFT;  Surgeon: Conrad [place]s, MD;  Location: [place];  Service: Vascular;  Laterality: Right;  . HEMORRHOID SURGERY    . LEFT HEART CATH AND CORONARY ANGIOGRAPHY N/A 03/19/2018   Procedure: LEFT HEART CATH AND CORONARY ANGIOGRAPHY;  Surgeon: Martinique, Peter M, MD;  Location: [place] [facility];  Service: Cardiovascular;  Laterality: N/A;  . LOWER EXTREMITY ANGIOGRAPHY N/A 08/31/2019   Procedure: LOWER EXTREMITY ANGIOGRAPHY;  Surgeon: [name], MD;  Location: [place] [facility];  Service: Cardiovascular;  Laterality: N/A;  . LOWER EXTREMITY ANGIOGRAPHY Right 01/18/2020   Procedure: LOWER EXTREMITY ANGIOGRAPHY;  Surgeon: [name], MD;  Location: [place] [facility];  Service: Cardiovascular;  Laterality: Right;  . PATCH ANGIOPLASTY Right 01/09/2018   Procedure: PATCH ANGIOPLASTY USING Rueben Bash BIOLOGIC 1CM X 6CM PATCH;  Surgeon: Conrad Aquebogue, MD;  Location: [place];  Service: Vascular;  Laterality: Right;  . PERIPHERAL VASCULAR ATHERECTOMY Right 01/19/2020   Procedure: PERIPHERAL VASCULAR ATHERECTOMY;  Surgeon: [name], MD;  Location: [place] [facility];  Service: Cardiovascular;  Laterality: Right;  Femoral popliteal and tibioperoneal arteries.   Marland Kitchen PERIPHERAL VASCULAR BALLOON ANGIOPLASTY Right 09/01/2019   Procedure: PERIPHERAL VASCULAR BALLOON ANGIOPLASTY;  Surgeon: Waynetta Sandy, MD;  Location: [place] [facility];  Service: Cardiovascular;  Laterality: Right;  peroneal artery.  Marland Kitchen PERIPHERAL VASCULAR INTERVENTION Right 07/25/2019   Procedure: PERIPHERAL VASCULAR INTERVENTION;  Surgeon: Waynetta Sandy, MD;  Location: [place] [facility];  Service: Cardiovascular;  Laterality: Right;  SFA X 3  . PERIPHERAL VASCULAR INTERVENTION Right 09/01/2019   Procedure: PERIPHERAL VASCULAR INTERVENTION;  Surgeon: Waynetta Sandy, MD;  Location: [place] [facility];  Service: Cardiovascular;  Laterality: Right;  superficial femoral  . PERIPHERAL VASCULAR THROMBECTOMY Right 09/01/2019   Procedure: PERIPHERAL VASCULAR THROMBECTOMY;  Surgeon: Waynetta Sandy, MD;  Location: [place] [facility];  Service: Cardiovascular;  Laterality: Right;  . PERIPHERAL VASCULAR THROMBECTOMY Right 01/18/2020   Procedure: PERIPHERAL VASCULAR THROMBECTOMY;  Surgeon: [name], MD;  Location: [place] [facility];  Service: Cardiovascular;  Laterality: Right;  . PERIPHERAL VASCULAR THROMBECTOMY N/A 01/19/2020   Procedure: LYSIS RECHECK;  Surgeon: [name], MD;  Location: [place] [facility];  Service: Cardiovascular;  Laterality: N/A;  . POSTERIOR LUMBAR FUSION  1978  . THROMBECTOMY FEMORAL ARTERY Right 01/09/2018   Procedure: THROMBECTOMY RIGHT LOWER LEG;  Surgeon: Conrad Vance, MD;  [facility];  Service: Vascular;  Laterality: Right;  . TONSILLECTOMY       reports that he quit smoking about 25 years ago. He has a 90.00 pack-year smoking history. He has never used smokeless tobacco. He reports that he does not drink alcohol and does not use drugs.  Allergies  Allergen Reactions  . Oxycodone Nausea Only  . Statins Other (See Comments)    Muscle and Bone pain Muscle and Bone pain  . Donepezil Other (See  Comments)    [facility]  . Imipramine Other (See Comments)    [facility]  . Oxybutynin Chloride Swelling and Other (See Comments)    [facility]    Family History  Problem Relation Age of Onset  . CAD Mother   . CAD Brother   . Hypertension Other      Prior to Admission medications   Medication Sig Start Date End Date Taking? Authorizing Provider  acetaminophen (TYLENOL) 500 MG tablet Take 500 mg by mouth 2 (two) times daily as needed (for pain or headaches).    Yes [provider]  amiodarone (PACERONE) 100 MG tablet Take 1 tablet (100 mg total) by mouth 2 (two) times daily. Patient taking differently: Take 200 mg by mouth daily.  01/21/20  Yes Baglia, Corrina, PA-C  apixaban (ELIQUIS) 5 MG TABS tablet Take 5 mg by mouth 2 (two) times daily.    Yes [provider]  Cholecalciferol (VITAMIN D3 PO) Take 1 tablet by mouth daily.   Yes [provider]  clopidogrel (PLAVIX) 75 MG tablet Take 1 tablet (75 mg total) by mouth daily. Patient taking differently: Take 75 mg by mouth at bedtime.  01/14/18  Yes Dagoberto Ligas, PA-C  Cyanocobalamin (VITAMIN B-12 PO) Take 1 tablet by mouth daily.   Yes [provider]  ezetimibe (ZETIA) 10 MG tablet Take 10 mg by mouth every evening.    Yes [provider]  Ferrous Sulfate (IRON PO) Take 1 tablet by mouth daily.   Yes [provider]  finasteride (PROSCAR) 5 MG tablet Take 5 mg by mouth daily.   Yes [provider]  hydrochlorothiazide (MICROZIDE) 12.5 MG capsule Take 12.5 mg by mouth daily.   Yes [provider]  ketorolac (ACULAR) 0.5 % ophthalmic solution Place 1 drop into the right eye daily.  05/16/20  Yes [provider]  memantine (NAMENDA) 10 MG tablet Take 10 mg by mouth in the morning.    Yes [provider]  metoprolol (LOPRESSOR) 50 MG tablet Take 50 mg by mouth 2 (two) times daily.   Yes [provider]  nitroGLYCERIN (NITROSTAT) 0.4 MG SL tablet  Place 0.4 mg under the tongue every 5 (five) minutes as needed for chest pain.    Yes [provider]  pantoprazole (PROTONIX) 40 MG tablet Take 40 mg by mouth daily.   Yes [provider]  ramipril (ALTACE) 10 MG capsule Take 10 mg by mouth 2 (two) times daily.   Yes [provider]  rivastigmine (EXELON) 6 MG capsule Take 6 mg by mouth 2 (two) times daily.   Yes [provider]    Physical Exam: Vitals:   05/18/20 1927 05/18/20 1930 05/18/20 2000 05/18/20 2030  BP: (!) 175/61 (!) 179/54 (!) 166/31 (!) 161/71  Pulse: 63 (!) 56 60 (!) 58  Resp: (!) 21 19 20  (!) 25  Temp: 98.4 F (36.9 C)     TempSrc: Oral     SpO2: 95% 94%  94% 93%  Weight:      Height:        Constitutional: Acute alert and oriented x3, no associated distress.   Skin: no rashes, no lesions, good skin turgor noted. Eyes: Pupils are equally reactive to light.  No evidence of scleral icterus or conjunctival pallor.  ENMT: Moist mucous membranes noted.  Posterior pharynx clear of any exudate or lesions.   Neck: normal, supple, no masses, no thyromegaly.  No evidence of jugular venous distension.   Respiratory: clear to auscultation bilaterally, no wheezing, no crackles. Normal respiratory effort. No accessory muscle use.  Cardiovascular: Regular rate and rhythm, no murmurs / rubs / gallops.  +1 pitting edema of the distal bilateral lower extremities. 2+ pedal pulses. No carotid bruits.  Chest:   Nontender without crepitus or deformity.   Back:   Nontender without crepitus or deformity. Abdomen: Abdomen is soft and nontender.  No evidence of intra-abdominal masses.  Positive bowel sounds noted in all quadrants.   Musculoskeletal: No joint deformity upper and lower extremities. Good ROM, no contractures. Normal muscle tone.  Neurologic: CN 2-12 grossly intact. Sensation intact.  Patient moving all 4 extremities spontaneously.  Patient is following all commands.  Patient is responsive to  verbal stimuli.   Psychiatric: Patient exhibits normal mood with appropriate affect.  Patient seems to possess insight as to their current situation.     Labs on Admission: I have personally reviewed following labs and imaging studies -   CBC: Recent Labs  Lab 05/18/20 1419  WBC 4.6  HGB 11.9*  HCT 38.2*  MCV 95.0  PLT 734   Basic Metabolic Panel: Recent Labs  Lab 05/18/20 1419 05/18/20 1642  NA 140  --   K 4.3  --   CL 105  --   CO2 25  --   GLUCOSE 105*  --   BUN 18  --   CREATININE 1.32*  --   CALCIUM 9.0  --   MG  --  1.9   GFR: Estimated Creatinine Clearance: 45.4 mL/min (A) (by C-G formula based on SCr of 1.32 mg/dL (H)). Liver Function Tests: Recent Labs  Lab 05/18/20 1642  AST 61*  ALT 83*  ALKPHOS 77  BILITOT 0.4  PROT 6.3*  ALBUMIN 3.5   No results for input(s): LIPASE, AMYLASE in the last 168 hours. No results for input(s): AMMONIA in the last 168 hours. Coagulation Profile: No results for input(s): INR, PROTIME in the last 168 hours. Cardiac Enzymes: No results for input(s): CKTOTAL, CKMB, CKMBINDEX, TROPONINI in the last 168 hours. BNP (last 3 results) No results for input(s): PROBNP in the last 8760 hours. HbA1C: No results for input(s): HGBA1C in the last 72 hours. CBG: No results for input(s): GLUCAP in the last 168 hours. Lipid Profile: No results for input(s): CHOL, HDL, LDLCALC, TRIG, CHOLHDL, LDLDIRECT in the last 72 hours. Thyroid Function Tests: No results for input(s): TSH, T4TOTAL, FREET4, T3FREE, THYROIDAB in the last 72 hours. Anemia Panel: No results for input(s): VITAMINB12, FOLATE, FERRITIN, TIBC, IRON, RETICCTPCT in the last 72 hours. Urine analysis:    Component Value Date/Time   COLORURINE YELLOW 08/30/2019 [place] 08/30/2019 2315   LABSPEC 1.019 08/30/2019 2315   PHURINE 6.0 08/30/2019 2315   GLUCOSEU NEGATIVE 08/30/2019 2315   HGBUR NEGATIVE 08/30/2019 2315   BILIRUBINUR NEGATIVE 08/30/2019 2315    KETONESUR NEGATIVE 08/30/2019 2315   PROTEINUR NEGATIVE 08/30/2019 2315   UROBILINOGEN 0.2 07/02/2014 1800   NITRITE  NEGATIVE 08/30/2019 2315   LEUKOCYTESUR NEGATIVE 08/30/2019 2315    Radiological Exams on Admission - Personally Reviewed: DG Chest 2 View  Result Date: 05/18/2020 CLINICAL DATA:  Chest pain. EXAM: CHEST - 2 VIEW COMPARISON:  PET-CT 04/10/2020.  Chest x-ray 11/2019. FINDINGS: Mediastinum and hilar structures normal. Heart size normal. Previously identified spiculated nodule in the medial right lung base best identified by prior PET-CT. No acute infiltrate noted. No pleural effusion or pneumothorax. Metallic density small radiopacity noted over the anterior abdomen. Degenerative change in scoliosis thoracic spine. IMPRESSION: 1. Previously identified spiculated nodular density in the medial right lung base best identified by prior PET-CT. 2. No acute abnormality identified. Electronically Signed   By: Marcello Moores  Register   On: 05/18/2020 14:47    EKG: Personally reviewed.  Rhythm is sinus bradycardia with heart rate of 57 bpm.  No dynamic ST segment changes appreciated.  Assessment/Plan Principal Problem:   Chest pain   Patient presenting with chest discomfort with both typical and atypical features  Patient perceives these episodes of chest discomfort as episodes of rapid atrial fibrillation however upon evaluation here in the emergency department patient is in sinus bradycardia.  Considering patient's extensive risk factors possibility of ischemic chest pain due to progressive coronary artery disease must be considered  Cycling cardiac enzymes  Monitoring patient on telemetry  Continuing home regimen of Eliquis and Plavix  As needed nitroglycerin for episodes of chest discomfort  Placing observation on the cardiac telemetry unit  Echocardiogram in the morning  Cardiology consultation in the morning to determine whether an ischemic assessment is  warranted.  Active Problems:   AF (paroxysmal atrial fibrillation) ([place])   Patient is a longstanding known history of paroxysmal atrial fibrillation  Patient is stating that these episodes of chest discomfort are due to perceived atrial fibrillation and have been increasing in intensity since his amiodarone was switched to once daily.  We will monitor while hospitalized on telemetry for episodes of rapid atrial fibrillation  Continuing home regimen of amiodarone and metoprolol  Continue home regimen of Eliquis  Patient may require an event monitor at time of discharge.    Essential hypertension   Continue home regimen of antihypertensive therapy    Coronary artery disease involving native coronary artery of native heart   Continuing home regimen antiplatelet therapy, beta-blocker and lipid-lowering therapy  Monitoring on telemetry  Please see remainder of assessment and plan above.    Type 2 diabetes mellitus with stage 3a chronic kidney disease, without long-term current use of insulin (HCC)   Accu-Cheks before every meal and nightly with sliding scale insulin  Hemoglobin A1c pending    PAD (peripheral artery disease) (HCC)   Longstanding known history of extensive disease with multiple complications requiring interventions, with most recent intervention in July 2021  No current evidence of limb ischemia  Continue home regimen of anticoagulation with antiplatelet therapy    Chronic kidney disease, stage 3a ([place])   Strict input and output monitoring  Monitoring renal function electrolytes with serial chemistries    Chronic diastolic (congestive) heart failure (HCC)   Slightly elevated BNP of 374  Clinically, patient does not appear to be in cardiogenic volume overload however will provide patient with a trial dose of 20 mg of IV Lasix x2 doses  Monitoring renal function and electrolytes with serial chemistries    Mixed diabetic hyperlipidemia associated  with type 2 diabetes mellitus ([place])   Continue home regimen of lipid-lowering therapy    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  esophagitis    Continue home regimen of daily PPI   Code Status:  Full code Family Communication: deferred   Status is: Observation  The patient remains OBS appropriate and will d/c before 2 midnights.  Dispo: The patient is from: Home              Anticipated d/c is to: Home              Anticipated d/c date is: 2 days              Patient currently is not medically stable to d/c.        Vernelle Emerald MD [facility] Pager [phone]  If 7PM-7AM, please contact night-coverage [email or web address] Use universal [place] password for that web site. If you do not have the password, please call the hospital operator.  05/18/2020, 8:48 PM

## 2020-05-18 NOTE — ED Triage Notes (Signed)
Pt c/o chest pain and hypotension for the last several days. Pt also feels like afib is acting up.

## 2020-05-18 NOTE — ED Notes (Signed)
Attempted to call report x 1. 3E RN will call back.

## 2020-05-19 ENCOUNTER — Observation Stay (HOSPITAL_COMMUNITY): Payer: No Typology Code available for payment source

## 2020-05-19 DIAGNOSIS — Z86718 Personal history of other venous thrombosis and embolism: Secondary | ICD-10-CM | POA: Diagnosis not present

## 2020-05-19 DIAGNOSIS — G8929 Other chronic pain: Secondary | ICD-10-CM | POA: Diagnosis present

## 2020-05-19 DIAGNOSIS — J449 Chronic obstructive pulmonary disease, unspecified: Secondary | ICD-10-CM | POA: Diagnosis present

## 2020-05-19 DIAGNOSIS — I2510 Atherosclerotic heart disease of native coronary artery without angina pectoris: Secondary | ICD-10-CM | POA: Diagnosis present

## 2020-05-19 DIAGNOSIS — N179 Acute kidney failure, unspecified: Secondary | ICD-10-CM | POA: Diagnosis present

## 2020-05-19 DIAGNOSIS — I480 Paroxysmal atrial fibrillation: Secondary | ICD-10-CM

## 2020-05-19 DIAGNOSIS — E782 Mixed hyperlipidemia: Secondary | ICD-10-CM | POA: Diagnosis present

## 2020-05-19 DIAGNOSIS — Z8673 Personal history of transient ischemic attack (TIA), and cerebral infarction without residual deficits: Secondary | ICD-10-CM | POA: Diagnosis not present

## 2020-05-19 DIAGNOSIS — I5031 Acute diastolic (congestive) heart failure: Secondary | ICD-10-CM

## 2020-05-19 DIAGNOSIS — Z87891 Personal history of nicotine dependence: Secondary | ICD-10-CM | POA: Diagnosis not present

## 2020-05-19 DIAGNOSIS — N1831 Chronic kidney disease, stage 3a: Secondary | ICD-10-CM

## 2020-05-19 DIAGNOSIS — Z79899 Other long term (current) drug therapy: Secondary | ICD-10-CM | POA: Diagnosis not present

## 2020-05-19 DIAGNOSIS — E1122 Type 2 diabetes mellitus with diabetic chronic kidney disease: Secondary | ICD-10-CM | POA: Diagnosis present

## 2020-05-19 DIAGNOSIS — Z7902 Long term (current) use of antithrombotics/antiplatelets: Secondary | ICD-10-CM | POA: Diagnosis not present

## 2020-05-19 DIAGNOSIS — I5032 Chronic diastolic (congestive) heart failure: Secondary | ICD-10-CM | POA: Diagnosis present

## 2020-05-19 DIAGNOSIS — Z7901 Long term (current) use of anticoagulants: Secondary | ICD-10-CM | POA: Diagnosis not present

## 2020-05-19 DIAGNOSIS — K219 Gastro-esophageal reflux disease without esophagitis: Secondary | ICD-10-CM | POA: Diagnosis present

## 2020-05-19 DIAGNOSIS — R079 Chest pain, unspecified: Secondary | ICD-10-CM | POA: Diagnosis not present

## 2020-05-19 DIAGNOSIS — Z955 Presence of coronary angioplasty implant and graft: Secondary | ICD-10-CM | POA: Diagnosis not present

## 2020-05-19 DIAGNOSIS — E1169 Type 2 diabetes mellitus with other specified complication: Secondary | ICD-10-CM | POA: Diagnosis present

## 2020-05-19 DIAGNOSIS — I25119 Atherosclerotic heart disease of native coronary artery with unspecified angina pectoris: Secondary | ICD-10-CM

## 2020-05-19 DIAGNOSIS — E1151 Type 2 diabetes mellitus with diabetic peripheral angiopathy without gangrene: Secondary | ICD-10-CM | POA: Diagnosis present

## 2020-05-19 DIAGNOSIS — Z20822 Contact with and (suspected) exposure to covid-19: Secondary | ICD-10-CM | POA: Diagnosis present

## 2020-05-19 DIAGNOSIS — I130 Hypertensive heart and chronic kidney disease with heart failure and stage 1 through stage 4 chronic kidney disease, or unspecified chronic kidney disease: Secondary | ICD-10-CM | POA: Diagnosis present

## 2020-05-19 DIAGNOSIS — I252 Old myocardial infarction: Secondary | ICD-10-CM | POA: Diagnosis not present

## 2020-05-19 LAB — HEMOGLOBIN A1C
Hgb A1c MFr Bld: 6.1 % — ABNORMAL HIGH (ref 4.8–5.6)
Mean Plasma Glucose: 128.37 mg/dL

## 2020-05-19 LAB — RENAL FUNCTION PANEL
Albumin: 3.1 g/dL — ABNORMAL LOW (ref 3.5–5.0)
Anion gap: 12 (ref 5–15)
BUN: 16 mg/dL (ref 8–23)
CO2: 26 mmol/L (ref 22–32)
Calcium: 8.9 mg/dL (ref 8.9–10.3)
Chloride: 103 mmol/L (ref 98–111)
Creatinine, Ser: 1.41 mg/dL — ABNORMAL HIGH (ref 0.61–1.24)
GFR, Estimated: 50 mL/min — ABNORMAL LOW (ref >60)
Glucose, Bld: 88 mg/dL (ref 70–99)
Phosphorus: 4.5 mg/dL (ref 2.5–4.6)
Potassium: 4 mmol/L (ref 3.5–5.1)
Sodium: 141 mmol/L (ref 135–145)

## 2020-05-19 LAB — ECHOCARDIOGRAM COMPLETE
AR max vel: 2.85 cm2
AV Area VTI: 2.79 cm2
AV Area mean vel: 2.85 cm2
AV Mean grad: 3 mmHg
AV Peak grad: 7.1 mmHg
Ao pk vel: 1.34 m/s
Area-P 1/2: 2.42 cm2
Height: 69 in
P 1/2 time: 708 msec
S' Lateral: 3.8 cm
Weight: 2689.6 oz

## 2020-05-19 LAB — LIPID PANEL
Cholesterol: 125 mg/dL (ref 0–200)
HDL: 29 mg/dL — ABNORMAL LOW (ref >40)
LDL Cholesterol: 71 mg/dL (ref 0–99)
Total CHOL/HDL Ratio: 4.3 RATIO
Triglycerides: 127 mg/dL (ref <150)
VLDL: 25 mg/dL (ref 0–40)

## 2020-05-19 LAB — GLUCOSE, CAPILLARY
Glucose-Capillary: 99 mg/dL (ref 70–99)
Glucose-Capillary: 113 mg/dL — ABNORMAL HIGH (ref 70–99)
Glucose-Capillary: 111 mg/dL — ABNORMAL HIGH (ref 70–99)
Glucose-Capillary: 143 mg/dL — ABNORMAL HIGH (ref 70–99)

## 2020-05-19 LAB — TROPONIN I (HIGH SENSITIVITY)
Troponin I (High Sensitivity): 8 ng/L (ref <18)
Troponin I (High Sensitivity): 8 ng/L (ref <18)

## 2020-05-19 LAB — MAGNESIUM: Magnesium: 1.7 mg/dL (ref 1.7–2.4)

## 2020-05-19 MED ORDER — AMLODIPINE BESYLATE 5 MG PO TABS
5.0000 mg | ORAL_TABLET | Freq: Every day | ORAL | Status: DC
  Administered 2020-05-19 – 2020-05-20 (×2): 5 mg via ORAL
  Filled 2020-05-19 (×2): qty 1

## 2020-05-19 MED ORDER — MORPHINE SULFATE (PF) 2 MG/ML IV SOLN
2.0000 mg | Freq: Once | INTRAVENOUS | Status: AC
  Administered 2020-05-19: 2 mg via INTRAVENOUS
  Filled 2020-05-19: qty 1

## 2020-05-19 MED ORDER — METOPROLOL TARTRATE 5 MG/5ML IV SOLN
5.0000 mg | Freq: Once | INTRAVENOUS | Status: AC
  Administered 2020-05-19: 5 mg via INTRAVENOUS
  Filled 2020-05-19: qty 5

## 2020-05-19 NOTE — Assessment & Plan Note (Addendum)
-  Baseline creatinine 1.3-1.4 -Currently at baseline on admission - creat uptrended with lasix which was discontinued - needs close follow up and repeat labs

## 2020-05-19 NOTE — Assessment & Plan Note (Signed)
-  Does not tolerate statins -Continue Zetia -LDL 71, HDL 29, TC 125, TG 127

## 2020-05-19 NOTE — Progress Notes (Signed)
Assumed care of patient at 1900.  Patient in Afib w/ RVR and active chest pain at start of shift.  IV Metoprolol and SL nitro given per day shift nurse.  BP 112/55 and pulse irregular - 70s-90s with spikes to 120s.  Placed on progressive monitor and MD notified.  New order for IV morphine and may consider cardizem if rate continues to be unstable.  Will continue to monitor.

## 2020-05-19 NOTE — Assessment & Plan Note (Signed)
-  Continue Eliquis, Plavix, Zetia, Lopressor, ramipril - known vasculopath with PAD (s/p multiple interventions, see H&P)

## 2020-05-19 NOTE — Progress Notes (Signed)
PROGRESS NOTE    Donald Gordon   HQP:[id number]  DOB: 01-01-1941  DOA: 05/18/2020     0  PCP: [facility]  CC: CP  Hospital Course: Donald Gordon is an 79 yo male with PMH dCHF, PAD, GAVE, IDA, PAF, COPD, GERD, HLD who presented to the hospital with chest pain.  These have been ongoing intermittently for several months per patient report.  He seemed to associate them with episodes of A. fib with RVR.  He typically follows with the [place] for his cardiologist.  His amiodarone had recently been changed from twice daily to daily but the overall same total daily dose. On admission he was found to be hypertensive.  He does have a blood pressure log with him which was reviewed and reveals mostly controlled blood pressures at home with a few elevated levels in the 140s SBP noted. He was started on further blood pressure control regimen with addition of amlodipine.   Interval History:  Seen this morning resting comfortably in bed.  Stated his chest pain had tremendously improved.  He was almost wanting to go home but stated since we were starting a new blood pressure medication, prudent to watch and observe further overnight.  He was amenable with this.  Old records reviewed in assessment of this patient  ROS: Constitutional: negative for chills and fevers, Respiratory: negative for cough, Cardiovascular: positive for chest pain and Gastrointestinal: negative for abdominal pain  Assessment & Plan: * Chest pain -Troponins flat (7>9>9) and reassuring. EKG with sinus brady, TWI in ?septal leads - suspect CP in part due to uncontrolled BP on admission - start amlodipine and monitor CP and BP - repeat trop and EKG if severe CP (>7/10)  Essential hypertension - BP uncontrolled on admission - add amlodipine - continue ramipril - home BP log reviewed, mostly SBP 120s but some 140s noted  Chronic diastolic (congestive) heart failure (HCC) - s/p lasix on admission - BNP 377 - echo on  admission: EF 60-65%, Gr 2 DD; no LV WMA (reassuring) - may need low dose lasix at discharge  - monitor output   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 esophagitis -Continue Protonix  Chronic kidney disease, stage 3a (HCC) -Baseline creatinine 1.3-1.4 -Currently at baseline, continue monitoring while on diuresis  Mixed diabetic hyperlipidemia associated with type 2 diabetes mellitus ([place]) -Does not tolerate statins -Continue Zetia -LDL 71, HDL 29, TC 125, TG 127  PAD (peripheral artery disease) (HCC) -Continue Eliquis and Plavix  Type 2 diabetes mellitus with stage 3a chronic kidney disease, without long-term current use of insulin (HCC) -SSI and CBG monitoring -A1c 6.1%  Coronary artery disease involving native coronary artery of native heart -Continue Eliquis, Plavix, Zetia, Lopressor, ramipril - known vasculopath with PAD (s/p multiple interventions, see H&P)  AF (paroxysmal atrial fibrillation) (HCC) -Continue amiodarone, metoprolol -Continue Eliquis    Antimicrobials: None  DVT prophylaxis: Eliquis Code Status: Full Family Communication: None present Disposition Plan: Status is: Inpatient  Remains inpatient appropriate because:Ongoing active pain requiring inpatient pain management, IV treatments appropriate due to intensity of illness or inability to take PO and Inpatient level of care appropriate due to severity of illness   Dispo: The patient is from: Home              Anticipated d/c is to: Home              Anticipated d/c date is: 1 day              Patient  currently is not medically stable to d/c.       Objective: Blood pressure (!) 149/53, pulse (!) 59, temperature 98.2 F (36.8 C), temperature source Oral, resp. rate 18, height 5\' 9"  (1.753 m), weight 76.2 kg, SpO2 93 %.  Examination: General appearance: alert, cooperative and no distress Head: Normocephalic, without obvious abnormality, atraumatic Eyes: EOMI Lungs: clear to auscultation bilaterally Heart: regular  rate and rhythm and S1, S2 normal Abdomen: normal findings: bowel sounds normal and soft, non-tender Extremities: No edema Skin: mobility and turgor normal Neurologic: Grossly normal  Consultants:   None  Procedures:   None  Data Reviewed: I have personally reviewed following labs and imaging studies Results for orders placed or performed during the hospital encounter of 05/18/20 (from the past 24 hour(s))  Troponin I (High Sensitivity)     Status: None   Collection Time: 05/18/20  4:42 PM  Result Value Ref Range   Troponin I (High Sensitivity) 9 <18 ng/L  Hepatic function panel     Status: Abnormal   Collection Time: 05/18/20  4:42 PM  Result Value Ref Range   Total Protein 6.3 (L) 6.5 - 8.1 g/dL   Albumin 3.5 3.5 - 5.0 g/dL   AST 61 (H) 15 - 41 U/L   ALT 83 (H) 0 - 44 U/L   Alkaline Phosphatase 77 38 - 126 U/L   Total Bilirubin 0.4 0.3 - 1.2 mg/dL   Bilirubin, Direct 0.2 0.0 - 0.2 mg/dL   Indirect Bilirubin 0.2 (L) 0.3 - 0.9 mg/dL  Magnesium     Status: None   Collection Time: 05/18/20  4:42 PM  Result Value Ref Range   Magnesium 1.9 1.7 - 2.4 mg/dL  Brain natriuretic peptide     Status: Abnormal   Collection Time: 05/18/20  5:21 PM  Result Value Ref Range   B Natriuretic Peptide 377.4 (H) 0.0 - 100.0 pg/mL  Resp Panel by RT-PCR (Flu A&B, Covid) Nasopharyngeal Swab     Status: None   Collection Time: 05/18/20  7:48 PM   Specimen: Nasopharyngeal Swab; Nasopharyngeal(NP) swabs in vial transport medium  Result Value Ref Range   SARS Coronavirus 2 by RT PCR NEGATIVE NEGATIVE   Influenza A by PCR NEGATIVE NEGATIVE   Influenza B by PCR NEGATIVE NEGATIVE  Troponin I (High Sensitivity)     Status: None   Collection Time: 05/18/20  9:44 PM  Result Value Ref Range   Troponin I (High Sensitivity) 9 <18 ng/L  Glucose, capillary     Status: Abnormal   Collection Time: 05/18/20 11:41 PM  Result Value Ref Range   Glucose-Capillary 201 (H) 70 - 99 mg/dL  Lipid panel      Status: Abnormal   Collection Time: 05/19/20  4:27 AM  Result Value Ref Range   Cholesterol 125 0 - 200 mg/dL   Triglycerides 127 <150 mg/dL   HDL 29 (L) >40 mg/dL   Total CHOL/HDL Ratio 4.3 RATIO   VLDL 25 0 - 40 mg/dL   LDL Cholesterol 71 0 - 99 mg/dL  Hemoglobin A1c     Status: Abnormal   Collection Time: 05/19/20  4:27 AM  Result Value Ref Range   Hgb A1c MFr Bld 6.1 (H) 4.8 - 5.6 %   Mean Plasma Glucose 128.37 mg/dL  Renal function panel     Status: Abnormal   Collection Time: 05/19/20  4:27 AM  Result Value Ref Range   Sodium 141 135 - 145 mmol/L   Potassium 4.0 3.5 -  5.1 mmol/L   Chloride 103 98 - 111 mmol/L   CO2 26 22 - 32 mmol/L   Glucose, Bld 88 70 - 99 mg/dL   BUN 16 8 - 23 mg/dL   Creatinine, Ser 1.41 (H) 0.61 - 1.24 mg/dL   Calcium 8.9 8.9 - 10.3 mg/dL   Phosphorus 4.5 2.5 - 4.6 mg/dL   Albumin 3.1 (L) 3.5 - 5.0 g/dL   GFR, Estimated 50 (L) >60 mL/min   Anion gap 12 5 - 15  Magnesium     Status: None   Collection Time: 05/19/20  4:27 AM  Result Value Ref Range   Magnesium 1.7 1.7 - 2.4 mg/dL  Glucose, capillary     Status: None   Collection Time: 05/19/20  6:29 AM  Result Value Ref Range   Glucose-Capillary 99 70 - 99 mg/dL  Glucose, capillary     Status: Abnormal   Collection Time: 05/19/20 12:30 PM  Result Value Ref Range   Glucose-Capillary 113 (H) 70 - 99 mg/dL    Recent Results (from the past 240 hour(s))  Resp Panel by RT-PCR (Flu A&B, Covid) Nasopharyngeal Swab     Status: None   Collection Time: 05/18/20  7:48 PM   Specimen: Nasopharyngeal Swab; Nasopharyngeal(NP) swabs in vial transport medium  Result Value Ref Range Status   SARS Coronavirus 2 by RT PCR NEGATIVE NEGATIVE Final    Comment: (NOTE) SARS-CoV-2 target nucleic acids are NOT DETECTED.  The SARS-CoV-2 RNA is generally detectable in upper respiratory specimens during the acute phase of infection. The lowest concentration of SARS-CoV-2 viral copies this assay can detect is 138  copies/mL. A negative result does not preclude SARS-Cov-2 infection and should not be used as the sole basis for treatment or other patient management decisions. A negative result may occur with  improper specimen collection/handling, submission of specimen other than nasopharyngeal swab, presence of viral mutation(s) within the areas targeted by this assay, and inadequate number of viral copies(<138 copies/mL). A negative result must be combined with clinical observations, patient history, and epidemiological information. The expected result is Negative.  Fact Sheet for Patients:  EntrepreneurPulse.com.au  Fact Sheet for Healthcare Providers:  IncredibleEmployment.be  This test is no t yet approved or cleared by the [place] FDA and  has been authorized for detection and/or diagnosis of SARS-CoV-2 by FDA under an Emergency Use Authorization (EUA). This EUA will remain  in effect (meaning this test can be used) for the duration of the COVID-19 declaration under Section 564(b)(1) of the Act, 21 U.S.C.section 360bbb-3(b)(1), unless the authorization is terminated  or revoked sooner.       Influenza A by PCR NEGATIVE NEGATIVE Final   Influenza B by PCR NEGATIVE NEGATIVE Final    Comment: (NOTE) The Xpert Xpress SARS-CoV-2/FLU/RSV plus assay is intended as an aid in the diagnosis of influenza from Nasopharyngeal swab specimens and should not be used as a sole basis for treatment. Nasal washings and aspirates are unacceptable for Xpert Xpress SARS-CoV-2/FLU/RSV testing.  Fact Sheet for Patients: EntrepreneurPulse.com.au  Fact Sheet for Healthcare Providers: IncredibleEmployment.be  This test is not yet approved or cleared by the [place] FDA and has been authorized for detection and/or diagnosis of SARS-CoV-2 by FDA under an Emergency Use Authorization (EUA). This EUA will remain in effect (meaning  this test can be used) for the duration of the COVID-19 declaration under Section 564(b)(1) of the Act, 21 U.S.C. section 360bbb-3(b)(1), unless the authorization is terminated or revoked.  Performed at  [facility], [place] [address], [place], [place] [address]      Radiology Studies: DG Chest 2 View  Result Date: 05/18/2020 CLINICAL DATA:  Chest pain. EXAM: CHEST - 2 VIEW COMPARISON:  PET-CT 04/10/2020.  Chest x-ray 11/2019. FINDINGS: Mediastinum and hilar structures normal. Heart size normal. Previously identified spiculated nodule in the medial right lung base best identified by prior PET-CT. No acute infiltrate noted. No pleural effusion or pneumothorax. Metallic density small radiopacity noted over the anterior abdomen. Degenerative change in scoliosis thoracic spine. IMPRESSION: 1. Previously identified spiculated nodular density in the medial right lung base best identified by prior PET-CT. 2. No acute abnormality identified. Electronically Signed   By: Marcello Moores  Register   On: 05/18/2020 14:47   ECHOCARDIOGRAM COMPLETE  Result Date: 05/19/2020    ECHOCARDIOGRAM REPORT   Patient Name:   Donald Gordon Date of Exam: 05/19/2020 Medical Rec #:  [id number]     Height:       69.0 in Accession #:    [id number]    Weight:       168.1 lb Date of Birth:  Oct 17, 1940    BSA:          1.919 m Patient Age:    77 years      BP:           130/42 mmHg Patient Gender: M             HR:           55 bpm. Exam Location:  Inpatient Procedure: 2D Echo, Cardiac Doppler and Color Doppler Indications:    CHF-Acute Diastolic  History:        Patient has prior history of Echocardiogram examinations, most                 recent 01/12/2018. CAD, Arrythmias:Atrial Fibrillation; Risk                 Factors:Hypertension, Diabetes, Dyslipidemia and Former Smoker.                 PAD. GERD.  Sonographer:    Clayton Lefort RDCS (AE) Referring Phys: [id number] Sherryll Burger [facility]  Sonographer Comments: Suboptimal parasternal window.  IMPRESSIONS  1. Left ventricular ejection fraction, by estimation, is 60 to 65%. The left ventricle has normal function. The left ventricle has no regional wall motion abnormalities. Left ventricular diastolic parameters are consistent with Grade II diastolic dysfunction (pseudonormalization). Elevated left atrial pressure.  2. Right ventricular systolic function is normal. The right ventricular size is normal. There is normal pulmonary artery systolic pressure. The estimated right ventricular systolic pressure is 55.7 mmHg.  3. Left atrial size was mild to moderately dilated.  4. Right atrial size was mildly dilated.  5. The mitral valve is normal in structure. Trivial mitral valve regurgitation. No evidence of mitral stenosis.  6. The aortic valve is normal in structure. Aortic valve regurgitation is trivial. Mild aortic valve sclerosis is present, with no evidence of aortic valve stenosis.  7. The inferior vena cava is normal in size with greater than 50% respiratory variability, suggesting right atrial pressure of 3 mmHg. FINDINGS  Left Ventricle: Left ventricular ejection fraction, by estimation, is 60 to 65%. The left ventricle has normal function. The left ventricle has no regional wall motion abnormalities. The left ventricular internal cavity size was normal in size. There is  no left ventricular hypertrophy. Left ventricular diastolic parameters are consistent with Grade II diastolic dysfunction (pseudonormalization).  Elevated left atrial pressure. Right Ventricle: The right ventricular size is normal. No increase in right ventricular wall thickness. Right ventricular systolic function is normal. There is normal pulmonary artery systolic pressure. The tricuspid regurgitant velocity is 2.41 m/s, and  with an assumed right atrial pressure of 3 mmHg, the estimated right ventricular systolic pressure is 81.1 mmHg. Left Atrium: Left atrial size was mild to moderately dilated. Right Atrium: Right atrial size was  mildly dilated. Pericardium: There is no evidence of pericardial effusion. Mitral Valve: The mitral valve is normal in structure. Trivial mitral valve regurgitation. No evidence of mitral valve stenosis. MV peak gradient, 3.9 mmHg. The mean mitral valve gradient is 1.0 mmHg. Tricuspid Valve: The tricuspid valve is normal in structure. Tricuspid valve regurgitation is trivial. No evidence of tricuspid stenosis. Aortic Valve: The aortic valve is normal in structure. Aortic valve regurgitation is trivial. Aortic regurgitation PHT measures 708 msec. Mild aortic valve sclerosis is present, with no evidence of aortic valve stenosis. Aortic valve mean gradient measures 3.0 mmHg. Aortic valve peak gradient measures 7.1 mmHg. Aortic valve area, by VTI measures 2.79 cm. Pulmonic Valve: The pulmonic valve was normal in structure. Pulmonic valve regurgitation is not visualized. No evidence of pulmonic stenosis. Aorta: The aortic root is normal in size and structure. Venous: The inferior vena cava is normal in size with greater than 50% respiratory variability, suggesting right atrial pressure of 3 mmHg. IAS/Shunts: No atrial level shunt detected by color flow Doppler.  LEFT VENTRICLE PLAX 2D LVIDd:         5.00 cm  Diastology LVIDs:         3.80 cm  LV e' medial:    5.48 cm/s LV PW:         1.60 cm  LV E/e' medial:  17.0 LV IVS:        1.70 cm  LV e' lateral:   7.13 cm/s LVOT diam:     2.10 cm  LV E/e' lateral: 13.1 LV SV:         84 LV SV Index:   44 LVOT Area:     3.46 cm  RIGHT VENTRICLE             IVC RV Basal diam:  3.50 cm     IVC diam: 1.80 cm RV S prime:     14.33 cm/s TAPSE (M-mode): 2.5 cm LEFT ATRIUM             Index       RIGHT ATRIUM           Index LA diam:        4.10 cm 2.14 cm/m  RA Area:     21.20 cm LA Vol (A2C):   78.6 ml 40.96 ml/m RA Volume:   57.60 ml  30.02 ml/m LA Vol (A4C):   47.7 ml 24.86 ml/m LA Biplane Vol: 62.7 ml 32.67 ml/m  AORTIC VALVE AV Area (Vmax):    2.85 cm AV Area (Vmean):    2.85 cm AV Area (VTI):     2.79 cm AV Vmax:           133.50 cm/s AV Vmean:          85.250 cm/s AV VTI:            0.302 m AV Peak Grad:      7.1 mmHg AV Mean Grad:      3.0 mmHg LVOT Vmax:  110.00 cm/s LVOT Vmean:        70.100 cm/s LVOT VTI:          0.243 m LVOT/AV VTI ratio: 0.81 AI PHT:            708 msec  AORTA Ao Root diam: 3.80 cm MITRAL VALVE               TRICUSPID VALVE MV Area (PHT): 2.42 cm    TR Peak grad:   23.2 mmHg MV Peak grad:  3.9 mmHg    TR Vmax:        241.00 cm/s MV Mean grad:  1.0 mmHg MV Vmax:       0.99 m/s    SHUNTS MV Vmean:      52.8 cm/s   Systemic VTI:  0.24 m MV Decel Time: 313 msec    Systemic Diam: 2.10 cm MV E velocity: 93.40 cm/s MV A velocity: 74.10 cm/s MV E/A ratio:  1.26 Mihai Croitoru MD Electronically signed by Sanda Klein MD Signature Date/Time: 05/19/2020/12:51:17 PM    Final    DG Chest 2 View  Final Result      Scheduled Meds: . amiodarone  200 mg Oral Daily  . amLODipine  5 mg Oral Daily  . apixaban  5 mg Oral BID  . clopidogrel  75 mg Oral QHS  . ezetimibe  10 mg Oral QPM  . finasteride  5 mg Oral Daily  . hydrochlorothiazide  12.5 mg Oral Daily  . insulin aspart  0-15 Units Subcutaneous TID AC & HS  . ketorolac  1 drop Right Eye Daily  . memantine  10 mg Oral q AM  . metoprolol tartrate  50 mg Oral BID  . pantoprazole  40 mg Oral Daily  . ramipril  10 mg Oral BID  . rivastigmine  6 mg Oral BID   PRN Meds: acetaminophen, nitroGLYCERIN, ondansetron (ZOFRAN) IV Continuous Infusions:   LOS: 0 days  Time spent: Greater than 50% of the 35 minute visit was spent in counseling/coordination of care for the patient as laid out in the A&P.   Dwyane Dee, MD [facility] 05/19/2020, 4:26 PM

## 2020-05-19 NOTE — Progress Notes (Signed)
  Echocardiogram 2D Echocardiogram has been performed.  Donald Gordon 05/19/2020, 12:37 PM

## 2020-05-19 NOTE — Assessment & Plan Note (Signed)
Continue Protonix °

## 2020-05-19 NOTE — Plan of Care (Signed)

## 2020-05-19 NOTE — Assessment & Plan Note (Signed)
-  SSI and CBG monitoring -A1c 6.1%

## 2020-05-19 NOTE — Progress Notes (Signed)
Patient admitted 11/26 with chest pain and possible CHF.  AAOx4, pleasant and cooperative.  SB on monitor throughout night with one brief burst of afib lasting approximately 1 minute.  No increase in chest pain or other symptoms, BP normal for patient after episode.  Episode did not recur to capture on EKG.  MD notified

## 2020-05-19 NOTE — Assessment & Plan Note (Signed)
-  Continue Eliquis and Plavix

## 2020-05-19 NOTE — Assessment & Plan Note (Addendum)
-  Troponins flat (7>9>9) and reassuring. EKG with sinus brady, TWI in ?septal leads - suspect CP in part due to uncontrolled BP on admission - started on amlodipine and monitor CP and BP; changed to diltiazem per cardiology - repeat trop and EKG if severe CP (>7/10) -Patient requested cardiology consult on 05/20/2020.  He was evaluated and offered cardiac cath with or without nuclear stress test but declined and wished to have this done by the [facility] -Patient chest pain-free at time of discharge

## 2020-05-19 NOTE — Hospital Course (Addendum)
Donald Gordon is an 79 yo male with PMH dCHF, PAD, GAVE, IDA, PAF, COPD, GERD, HLD who presented to the hospital with chest pain.  These have been ongoing intermittently for several months per patient report.  He seemed to associate them with episodes of A. fib with RVR.  He typically follows with the [facility] for his cardiologist.  His amiodarone had recently been changed from twice daily to daily but the overall same total daily dose. On admission he was found to be hypertensive.  He does have a blood pressure log with him which was reviewed and reveals mostly controlled blood pressures at home with a few elevated levels in the 140s SBP noted. He was started on further blood pressure control regimen with addition of amlodipine. He did have breakthrough A. fib with RVR on the evening of 05/19/2020 with some associated chest pain.  Troponins remained flat with no significant EKG changes.  Cardiology was consulted and he was offered cardiac cath with or without nuclear stress testing.  The patient declined and wished for follow-up with the [place].  The patient's amlodipine was transitioned to diltiazem per cardiology for improved heart rate control. His blood pressure remained relatively stable with further monitoring.  His heart rate was bradycardic in the low 50s.  His diltiazem and metoprolol were further adjusted prior to discharge.  He was recommended to follow-up closely with his cardiologist at the [facility] upon discharge and if unable to arrange, then to follow-up with Dr. Doylene Canard instead.

## 2020-05-19 NOTE — Assessment & Plan Note (Addendum)
-   s/p lasix on admission - BNP 377 - echo on admission: EF 60-65%, Gr 2 DD; no LV WMA (reassuring)

## 2020-05-19 NOTE — Progress Notes (Signed)
Patient complains of "heart feeling funny". Paged and notified MD. Obtained EKG and VS are stable. Gave 1 x NTG SL and a chest pain score of 5. VS still stable after 5 min however patient refused second NTG SL. VS are set to be taken q 15 min. EKG is in the patient's chart. MD ordered and gave a  one time dose of metoprolol IV. Will inform the oncoming nurse. Patient verbalizes a headache from the NTG SL and chest pain is a 3.

## 2020-05-19 NOTE — Assessment & Plan Note (Addendum)
-  Continue amiodarone, metoprolol -Continue Eliquis; renally adjusted at discharge

## 2020-05-19 NOTE — Assessment & Plan Note (Addendum)
-   BP uncontrolled on admission - add amlodipine; this has now been changed to diltiazem per cardiology on 05/20/2020 - continue ramipril - home BP log reviewed, mostly SBP 120s but some 140s noted -Further med adjustment prior to discharge.  He will be sent home on diltiazem 60 mg twice daily and metoprolol 25 mg twice daily - [facility] if [facility], then with Dr. Doylene Canard

## 2020-05-20 DIAGNOSIS — R079 Chest pain, unspecified: Secondary | ICD-10-CM | POA: Diagnosis not present

## 2020-05-20 DIAGNOSIS — I480 Paroxysmal atrial fibrillation: Secondary | ICD-10-CM | POA: Diagnosis not present

## 2020-05-20 DIAGNOSIS — I5032 Chronic diastolic (congestive) heart failure: Secondary | ICD-10-CM | POA: Diagnosis not present

## 2020-05-20 LAB — CBC WITH DIFFERENTIAL/PLATELET
Abs Immature Granulocytes: 0.01 10*3/uL (ref 0.00–0.07)
Basophils Absolute: 0 10*3/uL (ref 0.0–0.1)
Basophils Relative: 1 %
Eosinophils Absolute: 0.1 10*3/uL (ref 0.0–0.5)
Eosinophils Relative: 3 %
HCT: 36.9 % — ABNORMAL LOW (ref 39.0–52.0)
Hemoglobin: 11.9 g/dL — ABNORMAL LOW (ref 13.0–17.0)
Immature Granulocytes: 0 %
Lymphocytes Relative: 35 %
Lymphs Abs: 1.5 10*3/uL (ref 0.7–4.0)
MCH: 29.6 pg (ref 26.0–34.0)
MCHC: 32.2 g/dL (ref 30.0–36.0)
MCV: 91.8 fL (ref 80.0–100.0)
Monocytes Absolute: 0.9 10*3/uL (ref 0.1–1.0)
Monocytes Relative: 21 %
Neutro Abs: 1.7 10*3/uL (ref 1.7–7.7)
Neutrophils Relative %: 40 %
Platelets: 190 10*3/uL (ref 150–400)
RBC: 4.02 MIL/uL — ABNORMAL LOW (ref 4.22–5.81)
RDW: 13.1 % (ref 11.5–15.5)
WBC: 4.3 10*3/uL (ref 4.0–10.5)
nRBC: 0 % (ref 0.0–0.2)

## 2020-05-20 LAB — GLUCOSE, CAPILLARY
Glucose-Capillary: 92 mg/dL (ref 70–99)
Glucose-Capillary: 108 mg/dL — ABNORMAL HIGH (ref 70–99)
Glucose-Capillary: 98 mg/dL (ref 70–99)

## 2020-05-20 LAB — BASIC METABOLIC PANEL
Anion gap: 12 (ref 5–15)
BUN: 24 mg/dL — ABNORMAL HIGH (ref 8–23)
CO2: 27 mmol/L (ref 22–32)
Calcium: 9 mg/dL (ref 8.9–10.3)
Chloride: 101 mmol/L (ref 98–111)
Creatinine, Ser: 1.54 mg/dL — ABNORMAL HIGH (ref 0.61–1.24)
GFR, Estimated: 45 mL/min — ABNORMAL LOW (ref >60)
Glucose, Bld: 98 mg/dL (ref 70–99)
Potassium: 4.1 mmol/L (ref 3.5–5.1)
Sodium: 140 mmol/L (ref 135–145)

## 2020-05-20 LAB — MAGNESIUM: Magnesium: 1.7 mg/dL (ref 1.7–2.4)

## 2020-05-20 LAB — TROPONIN I (HIGH SENSITIVITY): Troponin I (High Sensitivity): 10 ng/L (ref <18)

## 2020-05-20 MED ORDER — DILTIAZEM HCL 60 MG PO TABS
60.0000 mg | ORAL_TABLET | Freq: Three times a day (TID) | ORAL | Status: DC
  Administered 2020-05-20 – 2020-05-21 (×3): 60 mg via ORAL
  Filled 2020-05-20 (×3): qty 1

## 2020-05-20 NOTE — Progress Notes (Signed)
Patient a;ert and oriented x 4 during initial shift assessment this am at 0730. Resps even and unlabored with clear lung sounds throughout. Bowel sounds active all quads, soft, nontender, nondistended. Patient is continent of bowel and bladder and had a small BM this morning. He utilizes the urinal as needed. Patient is ambulatory and up in room ad lib; he is steady on his feet and is a low fall risk. Patient voiced concern this am regarding not knowing the plan of care. Nurse answered all the questions she could, then reassured him we would pin down the plan today. Hospitalist came by and clarified things as well as referred him to cardiology, which came by and greatly reassured the patient. He is comfortable in bed, denies pain and has not had another afib episode. He feels good with the change from norvasc to diltiazem, but any further workup I.e. cardiac cath, he wishes to be done at the [place]. He continues to received ACHS CBG; however, he does not check his sugar at home and rarely needs coverage. He stated that his appetite is increasing today and again, stated he was pleased with the new plan of care. IV flushing well, reinforced with tape for comfort. Call light in reach, will monitor.

## 2020-05-20 NOTE — Consult Note (Signed)
Referring Physician:   EANN CLELAND is an 79 y.o. male.                       Chief Complaint: Atrial fibrillation and chest pain  HPI: 79 years old white male with PMH of diastolic CHF, CAD, PAD, paroxysmal atrial fibrillation, COPD, GERD, Type 2 DM and Hyperlipidemia has substernal, non-radiating chest pain. He also has paroxysmal atrial fibrillation and some of the chest pain is related to tachycardia. He had relief of chest pain with nitroglycerin use. Recent echocardiogram shows normal LV systolic function with grade 2 diastolic dysfunction, dilated LA and RA but trivial MR and TR. HS-Troponin levels are low at 8 to 10 ng. EKG showed sinus bradycardia and non-specific ST abnormality. CT of chest shows right lower lobe spiculated nodule.  Past Medical History:  Diagnosis Date  . Arthritis    "knees, elbows" (03/18/2018)  . Chronic edema    a. Chronic RLE edema.  Marland Kitchen COPD (chronic obstructive pulmonary disease) ([place])   . Coronary artery disease    a. MI s/p balloon 1996, details unclear.  Marland Kitchen Dysrhythmia    PROXIMAL MARGIN FIBRILATION  . GERD (gastroesophageal reflux disease)   . Hyperlipidemia   . Hypertension   . Myocardial infarction ([place]) 1996  . PAD (peripheral artery disease) ([place])    a. s/p stenting 08/2012, 02/2013.   Marland Kitchen PAF (paroxysmal atrial fibrillation) ([place])   . Sleep apnea    "have mask; have to start wearing it" (03/18/2018)  . Stroke [facility]) ~ 2014   denies residual on 03/18/2018  . Thrombosis of lower extremity    a. Listed on patient's medical bracelet - at [place].  Marland Kitchen Type II diabetes mellitus ([place])       Past Surgical History:  Procedure Laterality Date  . ABDOMINAL AORTOGRAM W/LOWER EXTREMITY N/A 07/25/2019   Procedure: ABDOMINAL AORTOGRAM W/LOWER EXTREMITY;  Surgeon: Waynetta Sandy, MD;  Location: [facility];  Service: Cardiovascular;  Laterality: N/A;  . ANTERIOR LUMBAR FUSION  1981  . APPENDECTOMY    . APPLICATION OF WOUND VAC Right 01/09/2018    Procedure: APPLICATION OF WOUND VAC ON RIGHT LOWER LEG;  Surgeon: Conrad [place], MD;  Location: [facility];  Service: Vascular;  Laterality: Right;  . BACK SURGERY    . CATARACT EXTRACTION W/ INTRAOCULAR LENS  IMPLANT, BILATERAL Bilateral   . CORONARY ANGIOPLASTY WITH STENT PLACEMENT     "I've got 1 stent in there" (03/18/2018)  . FASCIOTOMY Right 01/09/2018   Procedure: FASCIOTOMY RIGHT LOWER LEG;  Surgeon: Conrad [place], MD;  Location: [place];  Service: Vascular;  Laterality: Right;  . FASCIOTOMY CLOSURE Right 01/11/2018   Procedure: FASCIOTOMY CLOSURE RIGHT CALF;  Surgeon: Conrad Hubbard, MD;  Location: [facility];  Service: Vascular;  Laterality: Right;  . FEMORAL-POPLITEAL BYPASS GRAFT Right 01/09/2018   Procedure: RIGHT FEMORAL-POPLITEAL ARTERY BYPASS USING PROPATEN 6MM X 80CM VASCULAR GRAFT;  Surgeon: Conrad , MD;  Location: [place];  Service: Vascular;  Laterality: Right;  . HEMORRHOID SURGERY    . LEFT HEART CATH AND CORONARY ANGIOGRAPHY N/A 03/19/2018   Procedure: LEFT HEART CATH AND CORONARY ANGIOGRAPHY;  Surgeon: Martinique, Peter M, MD;  Location: [facility];  Service: Cardiovascular;  Laterality: N/A;  . LOWER EXTREMITY ANGIOGRAPHY N/A 08/31/2019   Procedure: LOWER EXTREMITY ANGIOGRAPHY;  Surgeon: [name], MD;  Location: [facility];  Service: Cardiovascular;  Laterality: N/A;  . LOWER  EXTREMITY ANGIOGRAPHY Right 01/18/2020   Procedure: LOWER EXTREMITY ANGIOGRAPHY;  Surgeon: [name], MD;  Location: [facility];  Service: Cardiovascular;  Laterality: Right;  . PATCH ANGIOPLASTY Right 01/09/2018   Procedure: PATCH ANGIOPLASTY USING Rueben Bash BIOLOGIC 1CM X 6CM PATCH;  Surgeon: Conrad Casey, MD;  Location: [place];  Service: Vascular;  Laterality: Right;  . PERIPHERAL VASCULAR ATHERECTOMY Right 01/19/2020   Procedure: PERIPHERAL VASCULAR ATHERECTOMY;  Surgeon: [name], MD;  Location: [facility];  Service: Cardiovascular;  Laterality:  Right;  Femoral popliteal and tibioperoneal arteries.  Marland Kitchen PERIPHERAL VASCULAR BALLOON ANGIOPLASTY Right 09/01/2019   Procedure: PERIPHERAL VASCULAR BALLOON ANGIOPLASTY;  Surgeon: Waynetta Sandy, MD;  Location: [facility];  Service: Cardiovascular;  Laterality: Right;  peroneal artery.  Marland Kitchen PERIPHERAL VASCULAR INTERVENTION Right 07/25/2019   Procedure: PERIPHERAL VASCULAR INTERVENTION;  Surgeon: Waynetta Sandy, MD;  Location: [facility];  Service: Cardiovascular;  Laterality: Right;  SFA X 3  . PERIPHERAL VASCULAR INTERVENTION Right 09/01/2019   Procedure: PERIPHERAL VASCULAR INTERVENTION;  Surgeon: Waynetta Sandy, MD;  Location: [facility];  Service: Cardiovascular;  Laterality: Right;  superficial femoral  . PERIPHERAL VASCULAR THROMBECTOMY Right 09/01/2019   Procedure: PERIPHERAL VASCULAR THROMBECTOMY;  Surgeon: Waynetta Sandy, MD;  Location: [facility];  Service: Cardiovascular;  Laterality: Right;  . PERIPHERAL VASCULAR THROMBECTOMY Right 01/18/2020   Procedure: PERIPHERAL VASCULAR THROMBECTOMY;  Surgeon: [name], MD;  Location: [facility];  Service: Cardiovascular;  Laterality: Right;  . PERIPHERAL VASCULAR THROMBECTOMY N/A 01/19/2020   Procedure: LYSIS RECHECK;  Surgeon: [name], MD;  Location: [facility];  Service: Cardiovascular;  Laterality: N/A;  . POSTERIOR LUMBAR FUSION  1978  . THROMBECTOMY FEMORAL ARTERY Right 01/09/2018   Procedure: THROMBECTOMY RIGHT LOWER LEG;  Surgeon: Conrad [place], MD;  Location: [place];  Service: Vascular;  Laterality: Right;  . TONSILLECTOMY      Family History  Problem Relation Age of Onset  . CAD Mother   . CAD Brother   . Hypertension Other    Social History:  reports that he quit smoking about 25 years ago. He has a 90.00 pack-year smoking history. He has never used smokeless tobacco. He reports that he does not drink alcohol and does not use  drugs.  Allergies:  Allergies  Allergen Reactions  . Oxycodone Nausea Only  . Statins Other (See Comments)    Muscle and Bone pain Muscle and Bone pain  . Donepezil Other (See Comments)    [facility]  . Imipramine Other (See Comments)    [facility]  . Oxybutynin Chloride Swelling and Other (See Comments)    [facility]    Medications Prior to Admission  Medication Sig Dispense Refill  . acetaminophen (TYLENOL) 500 MG tablet Take 500 mg by mouth 2 (two) times daily as needed (for pain or headaches).     Marland Kitchen amiodarone (PACERONE) 100 MG tablet Take 1 tablet (100 mg total) by mouth 2 (two) times daily. (Patient taking differently: Take 200 mg by mouth daily. ) 60 tablet 1  . apixaban (ELIQUIS) 5 MG TABS tablet Take 5 mg by mouth 2 (two) times daily.     . Cholecalciferol (VITAMIN D3 PO) Take 1 tablet by mouth daily.    . clopidogrel (PLAVIX) 75 MG tablet Take 1 tablet (75 mg total) by mouth daily. (Patient taking differently: Take 75 mg by mouth at bedtime. )  90 tablet 1  . Cyanocobalamin (VITAMIN B-12 PO) Take 1 tablet by mouth daily.    Marland Kitchen ezetimibe (ZETIA) 10 MG tablet Take 10 mg by mouth every evening.     . Ferrous Sulfate (IRON PO) Take 1 tablet by mouth daily.    . finasteride (PROSCAR) 5 MG tablet Take 5 mg by mouth daily.    . hydrochlorothiazide (MICROZIDE) 12.5 MG capsule Take 12.5 mg by mouth daily.    Marland Kitchen ketorolac (ACULAR) 0.5 % ophthalmic solution Place 1 drop into the right eye daily.     . memantine (NAMENDA) 10 MG tablet Take 10 mg by mouth in the morning.     . metoprolol (LOPRESSOR) 50 MG tablet Take 50 mg by mouth 2 (two) times daily.    . nitroGLYCERIN (NITROSTAT) 0.4 MG SL tablet Place 0.4 mg under the tongue every 5 (five) minutes as needed for chest pain.     . pantoprazole (PROTONIX) 40 MG tablet Take 40 mg by mouth daily.    . ramipril (ALTACE) 10 MG capsule Take 10 mg by mouth 2 (two) times daily.    . rivastigmine (EXELON) 6 MG capsule Take 6 mg by mouth 2 (two) times  daily.      Results for orders placed or performed during the hospital encounter of 05/18/20 (from the past 48 hour(s))  Basic metabolic panel     Status: Abnormal   Collection Time: 05/18/20  2:19 PM  Result Value Ref Range   Sodium 140 135 - 145 mmol/L   Potassium 4.3 3.5 - 5.1 mmol/L   Chloride 105 98 - 111 mmol/L   CO2 25 22 - 32 mmol/L   Glucose, Bld 105 (H) 70 - 99 mg/dL    Comment: Glucose reference range applies only to samples taken after fasting for at least 8 hours.   BUN 18 8 - 23 mg/dL   Creatinine, Ser 1.32 (H) 0.61 - 1.24 mg/dL   Calcium 9.0 8.9 - 10.3 mg/dL   GFR, Estimated 55 (L) >60 mL/min    Comment: (NOTE) Calculated using the CKD-EPI Creatinine Equation (2021)    Anion gap 10 5 - 15    Comment: Performed at [facility] [address], [place], [place] [address]  CBC     Status: Abnormal   Collection Time: 05/18/20  2:19 PM  Result Value Ref Range   WBC 4.6 4.0 - 10.5 K/uL   RBC 4.02 (L) 4.22 - 5.81 MIL/uL   Hemoglobin 11.9 (L) 13.0 - 17.0 g/dL   HCT 38.2 (L) 39 - 52 %   MCV 95.0 80.0 - 100.0 fL   MCH 29.6 26.0 - 34.0 pg   MCHC 31.2 30.0 - 36.0 g/dL   RDW 13.2 11.5 - 15.5 %   Platelets 206 150 - 400 K/uL   nRBC 0.0 0.0 - 0.2 %    Comment: Performed at [facility], [place] [address], [place],  [address]  Troponin I (High Sensitivity)     Status: None   Collection Time: 05/18/20  2:19 PM  Result Value Ref Range   Troponin I (High Sensitivity) 7 <18 ng/L    Comment: (NOTE) Elevated high sensitivity troponin I (hsTnI) values and significant  changes across serial measurements may suggest ACS but many other  chronic and acute conditions are known to elevate hsTnI results.  Refer to the "Links" section for chest pain algorithms and additional  guidance. Performed at [facility], [place]  [address] [place], [place] [address]   Troponin I (High Sensitivity)     Status: None   Collection Time: 05/18/20  4:42 PM  Result Value Ref  Range   Troponin I (High Sensitivity) 9 <18 ng/L    Comment: (NOTE) Elevated high sensitivity troponin I (hsTnI) values and significant  changes across serial measurements may suggest ACS but many other  chronic and acute conditions are known to elevate hsTnI results.  Refer to the "Links" section for chest pain algorithms and additional  guidance. Performed at [facility], [place] [address] [place], [place] [address]   Hepatic function panel     Status: Abnormal   Collection Time: 05/18/20  4:42 PM  Result Value Ref Range   Total Protein 6.3 (L) 6.5 - 8.1 g/dL   Albumin 3.5 3.5 - 5.0 g/dL   AST 61 (H) 15 - 41 U/L   ALT 83 (H) 0 - 44 U/L   Alkaline Phosphatase 77 38 - 126 U/L   Total Bilirubin 0.4 0.3 - 1.2 mg/dL   Bilirubin, Direct 0.2 0.0 - 0.2 mg/dL   Indirect Bilirubin 0.2 (L) 0.3 - 0.9 mg/dL    Comment: Performed at [facility] [address], [place], [place] [address]  Magnesium     Status: None   Collection Time: 05/18/20  4:42 PM  Result Value Ref Range   Magnesium 1.9 1.7 - 2.4 mg/dL    Comment: Performed at [facility], [place] [address] [place], [place] [address]  Brain natriuretic peptide     Status: Abnormal   Collection Time: 05/18/20  5:21 PM  Result Value Ref Range   B Natriuretic Peptide 377.4 (H) 0.0 - 100.0 pg/mL    Comment: Performed at [facility] [address], [place], [place] [address]  Resp Panel by RT-PCR (Flu A&B, Covid) Nasopharyngeal Swab     Status: None   Collection Time: 05/18/20  7:48 PM   Specimen: Nasopharyngeal Swab; Nasopharyngeal(NP) swabs in vial transport medium  Result Value Ref Range   SARS Coronavirus 2 by RT PCR NEGATIVE NEGATIVE    Comment: (NOTE) SARS-CoV-2 target nucleic acids are NOT DETECTED.  The SARS-CoV-2 RNA is generally detectable in upper respiratory specimens during the acute phase of infection. The lowest concentration of SARS-CoV-2 viral copies this assay can detect is 138 copies/mL. A  negative result does not preclude SARS-Cov-2 infection and should not be used as the sole basis for treatment or other patient management decisions. A negative result may occur with  improper specimen collection/handling, submission of specimen other than nasopharyngeal swab, presence of viral mutation(s) within the areas targeted by this assay, and inadequate number of viral copies(<138 copies/mL). A negative result must be combined with clinical observations, patient history, and epidemiological information. The expected result is Negative.  Fact Sheet for Patients:  EntrepreneurPulse.com.au  Fact Sheet for Healthcare Providers:  IncredibleEmployment.be  This test is no t yet approved or cleared by the [place] FDA and  has been authorized for detection and/or diagnosis of SARS-CoV-2 by FDA under an Emergency Use Authorization (EUA). This EUA will remain  in effect (meaning this test can be used) for the duration of the COVID-19 declaration under Section 564(b)(1) of the Act, 21 U.S.C.section 360bbb-3(b)(1), unless the authorization is terminated  or revoked sooner.       Influenza A by PCR NEGATIVE NEGATIVE   Influenza B by PCR NEGATIVE NEGATIVE    Comment: (NOTE) The Xpert Xpress  SARS-CoV-2/FLU/RSV plus assay is intended as an aid in the diagnosis of influenza from Nasopharyngeal swab specimens and should not be used as a sole basis for treatment. Nasal washings and aspirates are unacceptable for Xpert Xpress SARS-CoV-2/FLU/RSV testing.  Fact Sheet for Patients: EntrepreneurPulse.com.au  Fact Sheet for Healthcare Providers: IncredibleEmployment.be  This test is not yet approved or cleared by the [place] FDA and has been authorized for detection and/or diagnosis of SARS-CoV-2 by FDA under an Emergency Use Authorization (EUA). This EUA will remain in effect (meaning this test can be used)  for the duration of the COVID-19 declaration under Section 564(b)(1) of the Act, 21 U.S.C. section 360bbb-3(b)(1), unless the authorization is terminated or revoked.  Performed at [facility], [place] [address] [place], [place] [address]   Troponin I (High Sensitivity)     Status: None   Collection Time: 05/18/20  9:44 PM  Result Value Ref Range   Troponin I (High Sensitivity) 9 <18 ng/L    Comment: (NOTE) Elevated high sensitivity troponin I (hsTnI) values and significant  changes across serial measurements may suggest ACS but many other  chronic and acute conditions are known to elevate hsTnI results.  Refer to the "Links" section for chest pain algorithms and additional  guidance. Performed at [facility], [place] [address] [place], [place] [address]   Glucose, capillary     Status: Abnormal   Collection Time: 05/18/20 11:41 PM  Result Value Ref Range   Glucose-Capillary 201 (H) 70 - 99 mg/dL    Comment: Glucose reference range applies only to samples taken after fasting for at least 8 hours.  Lipid panel     Status: Abnormal   Collection Time: 05/19/20  4:27 AM  Result Value Ref Range   Cholesterol 125 0 - 200 mg/dL   Triglycerides 127 <150 mg/dL   HDL 29 (L) >40 mg/dL   Total CHOL/HDL Ratio 4.3 RATIO   VLDL 25 0 - 40 mg/dL   LDL Cholesterol 71 0 - 99 mg/dL    Comment:        Total Cholesterol/HDL:CHD Risk Coronary Heart Disease Risk Table                     Men   Women  1/2 Average Risk   3.4   3.3  Average Risk       5.0   4.4  2 X Average Risk   9.6   7.1  3 X Average Risk  23.4   11.0        Use the calculated Patient Ratio above and the CHD Risk Table to determine the patient's CHD Risk.        ATP III CLASSIFICATION (LDL):  <100     mg/dL   Optimal  100-129  mg/dL   Near or Above                    Optimal  130-159  mg/dL   Borderline  160-189  mg/dL   High  >190     mg/dL   Very High Performed at [facility] [address],  [place], [place] [address]   Hemoglobin A1c     Status: Abnormal   Collection Time: 05/19/20  4:27 AM  Result Value Ref Range   Hgb A1c MFr Bld 6.1 (H) 4.8 - 5.6 %    Comment: (NOTE) Pre diabetes:  5.7%-6.4%  Diabetes:              >6.4%  Glycemic control for   <7.0% adults with diabetes    Mean Plasma Glucose 128.37 mg/dL    Comment: Performed at [facility] [address], [place], [place] [address]  Renal function panel     Status: Abnormal   Collection Time: 05/19/20  4:27 AM  Result Value Ref Range   Sodium 141 135 - 145 mmol/L   Potassium 4.0 3.5 - 5.1 mmol/L   Chloride 103 98 - 111 mmol/L   CO2 26 22 - 32 mmol/L   Glucose, Bld 88 70 - 99 mg/dL    Comment: Glucose reference range applies only to samples taken after fasting for at least 8 hours.   BUN 16 8 - 23 mg/dL   Creatinine, Ser 1.41 (H) 0.61 - 1.24 mg/dL   Calcium 8.9 8.9 - 10.3 mg/dL   Phosphorus 4.5 2.5 - 4.6 mg/dL   Albumin 3.1 (L) 3.5 - 5.0 g/dL   GFR, Estimated 50 (L) >60 mL/min    Comment: (NOTE) Calculated using the CKD-EPI Creatinine Equation (2021)    Anion gap 12 5 - 15    Comment: Performed at [facility] [address], [place], [place] [address]  Magnesium     Status: None   Collection Time: 05/19/20  4:27 AM  Result Value Ref Range   Magnesium 1.7 1.7 - 2.4 mg/dL    Comment: Performed at [facility] [address] [place] [address], [place], [place] [address]  Glucose, capillary     Status: None   Collection Time: 05/19/20  6:29 AM  Result Value Ref Range   Glucose-Capillary 99 70 - 99 mg/dL    Comment: Glucose reference range applies only to samples taken after fasting for at least 8 hours.  Glucose, capillary     Status: Abnormal   Collection Time: 05/19/20 12:30 PM  Result Value Ref Range   Glucose-Capillary 113 (H) 70 - 99 mg/dL    Comment: Glucose reference range applies only to samples taken after fasting for at least 8 hours.  Glucose, capillary     Status: Abnormal    Collection Time: 05/19/20  5:06 PM  Result Value Ref Range   Glucose-Capillary 111 (H) 70 - 99 mg/dL    Comment: Glucose reference range applies only to samples taken after fasting for at least 8 hours.   Comment 1 Notify RN    Comment 2 Document in Chart   Troponin I (High Sensitivity)     Status: None   Collection Time: 05/19/20  6:54 PM  Result Value Ref Range   Troponin I (High Sensitivity) 8 <18 ng/L    Comment: (NOTE) Elevated high sensitivity troponin I (hsTnI) values and significant  changes across serial measurements may suggest ACS but many other  chronic and acute conditions are known to elevate hsTnI results.  Refer to the "Links" section for chest pain algorithms and additional  guidance. Performed at [facility], [place] [address] [place], [place] [address]   Troponin I (High Sensitivity)     Status: None   Collection Time: 05/19/20  8:53 PM  Result Value Ref Range   Troponin I (High Sensitivity) 8 <18 ng/L    Comment: (NOTE) Elevated high sensitivity troponin I (hsTnI) values and significant  changes across serial measurements may suggest ACS but many other  chronic and acute conditions are known to elevate hsTnI results.  Refer to the "Links" section for chest pain algorithms and additional  guidance. Performed at [facility], [place] [address], [place], [place] [address]   Glucose, capillary     Status: Abnormal   Collection Time: 05/19/20  9:08 PM  Result Value Ref Range   Glucose-Capillary 143 (H) 70 - 99 mg/dL    Comment: Glucose reference range applies only to samples taken after fasting for at least 8 hours.  Troponin I (High Sensitivity)     Status: None   Collection Time: 05/20/20 12:19 AM  Result Value Ref Range   Troponin I (High Sensitivity) 10 <18 ng/L    Comment: (NOTE) Elevated high sensitivity troponin I (hsTnI) values and significant  changes across serial measurements may suggest ACS but many other  chronic and acute conditions are  known to elevate hsTnI results.  Refer to the "Links" section for chest pain algorithms and additional  guidance. Performed at [facility], [place] [address], [place], [place] [address]   Basic metabolic panel     Status: Abnormal   Collection Time: 05/20/20  3:49 AM  Result Value Ref Range   Sodium 140 135 - 145 mmol/L   Potassium 4.1 3.5 - 5.1 mmol/L   Chloride 101 98 - 111 mmol/L   CO2 27 22 - 32 mmol/L   Glucose, Bld 98 70 - 99 mg/dL    Comment: Glucose reference range applies only to samples taken after fasting for at least 8 hours.   BUN 24 (H) 8 - 23 mg/dL   Creatinine, Ser 1.54 (H) 0.61 - 1.24 mg/dL   Calcium 9.0 8.9 - 10.3 mg/dL   GFR, Estimated 45 (L) >60 mL/min    Comment: (NOTE) Calculated using the CKD-EPI Creatinine Equation (2021)    Anion gap 12 5 - 15    Comment: Performed at [facility] [address], [place], [place] [address]  CBC with Differential/Platelet     Status: Abnormal   Collection Time: 05/20/20  3:49 AM  Result Value Ref Range   WBC 4.3 4.0 - 10.5 K/uL   RBC 4.02 (L) 4.22 - 5.81 MIL/uL   Hemoglobin 11.9 (L) 13.0 - 17.0 g/dL   HCT 36.9 (L) 39 - 52 %   MCV 91.8 80.0 - 100.0 fL   MCH 29.6 26.0 - 34.0 pg   MCHC 32.2 30.0 - 36.0 g/dL   RDW 13.1 11.5 - 15.5 %   Platelets 190 150 - 400 K/uL   nRBC 0.0 0.0 - 0.2 %   Neutrophils Relative % 40 %   Neutro Abs 1.7 1.7 - 7.7 K/uL   Lymphocytes Relative 35 %   Lymphs Abs 1.5 0.7 - 4.0 K/uL   Monocytes Relative 21 %   Monocytes Absolute 0.9 0.1 - 1.0 K/uL   Eosinophils Relative 3 %   Eosinophils Absolute 0.1 0.0 - 0.5 K/uL   Basophils Relative 1 %   Basophils Absolute 0.0 0.0 - 0.1 K/uL   Immature Granulocytes 0 %   Abs Immature Granulocytes 0.01 0.00 - 0.07 K/uL    Comment: Performed at [facility] [address], [place], [place] [address]  Magnesium     Status: None   Collection Time: 05/20/20  3:49 AM  Result Value Ref Range   Magnesium 1.7 1.7 - 2.4 mg/dL    Comment:  Performed at [facility] [address], [place], [place] [address]  Glucose, capillary     Status:  None   Collection Time: 05/20/20  6:05 AM  Result Value Ref Range   Glucose-Capillary 92 70 - 99 mg/dL    Comment: Glucose reference range applies only to samples taken after fasting for at least 8 hours.  Glucose, capillary     Status: Abnormal   Collection Time: 05/20/20 11:18 AM  Result Value Ref Range   Glucose-Capillary 108 (H) 70 - 99 mg/dL    Comment: Glucose reference range applies only to samples taken after fasting for at least 8 hours.   Comment 1 Notify RN    Comment 2 Document in Chart    DG Chest 2 View  Result Date: 05/18/2020 CLINICAL DATA:  Chest pain. EXAM: CHEST - 2 VIEW COMPARISON:  PET-CT 04/10/2020.  Chest x-ray 11/2019. FINDINGS: Mediastinum and hilar structures normal. Heart size normal. Previously identified spiculated nodule in the medial right lung base best identified by prior PET-CT. No acute infiltrate noted. No pleural effusion or pneumothorax. Metallic density small radiopacity noted over the anterior abdomen. Degenerative change in scoliosis thoracic spine. IMPRESSION: 1. Previously identified spiculated nodular density in the medial right lung base best identified by prior PET-CT. 2. No acute abnormality identified. Electronically Signed   By: Marcello Moores  Register   On: 05/18/2020 14:47   ECHOCARDIOGRAM COMPLETE  Result Date: 05/19/2020    ECHOCARDIOGRAM REPORT   Patient Name:   Donald Gordon Date of Exam: 05/19/2020 Medical Rec #:  [id number]     Height:       69.0 in Accession #:    [id number]    Weight:       168.1 lb Date of Birth:  [birth year]    BSA:          1.919 m Patient Age:    32 years      BP:           130/42 mmHg Patient Gender: M             HR:           55 bpm. Exam Location:  Inpatient Procedure: 2D Echo, Cardiac Doppler and Color Doppler Indications:    CHF-Acute Diastolic  History:        Patient has prior history of Echocardiogram  examinations, most                 recent 01/12/2018. CAD, Arrythmias:Atrial Fibrillation; Risk                 Factors:Hypertension, Diabetes, Dyslipidemia and Former Smoker.                 PAD. GERD.  Sonographer:    Clayton Lefort RDCS (AE) Referring Phys: [id number] Sherryll Burger [facility]  Sonographer Comments: Suboptimal parasternal window. IMPRESSIONS  1. Left ventricular ejection fraction, by estimation, is 60 to 65%. The left ventricle has normal function. The left ventricle has no regional wall motion abnormalities. Left ventricular diastolic parameters are consistent with Grade II diastolic dysfunction (pseudonormalization). Elevated left atrial pressure.  2. Right ventricular systolic function is normal. The right ventricular size is normal. There is normal pulmonary artery systolic pressure. The estimated right ventricular systolic pressure is 45.8 mmHg.  3. Left atrial size was mild to moderately dilated.  4. Right atrial size was mildly dilated.  5. The mitral valve is normal in structure. Trivial mitral valve regurgitation. No evidence of mitral stenosis.  6. The aortic valve is normal in structure. Aortic valve regurgitation is trivial. Mild aortic valve sclerosis is  present, with no evidence of aortic valve stenosis.  7. The inferior vena cava is normal in size with greater than 50% respiratory variability, suggesting right atrial pressure of 3 mmHg. FINDINGS  Left Ventricle: Left ventricular ejection fraction, by estimation, is 60 to 65%. The left ventricle has normal function. The left ventricle has no regional wall motion abnormalities. The left ventricular internal cavity size was normal in size. There is  no left ventricular hypertrophy. Left ventricular diastolic parameters are consistent with Grade II diastolic dysfunction (pseudonormalization). Elevated left atrial pressure. Right Ventricle: The right ventricular size is normal. No increase in right ventricular wall thickness. Right ventricular systolic  function is normal. There is normal pulmonary artery systolic pressure. The tricuspid regurgitant velocity is 2.41 m/s, and  with an assumed right atrial pressure of 3 mmHg, the estimated right ventricular systolic pressure is 88.4 mmHg. Left Atrium: Left atrial size was mild to moderately dilated. Right Atrium: Right atrial size was mildly dilated. Pericardium: There is no evidence of pericardial effusion. Mitral Valve: The mitral valve is normal in structure. Trivial mitral valve regurgitation. No evidence of mitral valve stenosis. MV peak gradient, 3.9 mmHg. The mean mitral valve gradient is 1.0 mmHg. Tricuspid Valve: The tricuspid valve is normal in structure. Tricuspid valve regurgitation is trivial. No evidence of tricuspid stenosis. Aortic Valve: The aortic valve is normal in structure. Aortic valve regurgitation is trivial. Aortic regurgitation PHT measures 708 msec. Mild aortic valve sclerosis is present, with no evidence of aortic valve stenosis. Aortic valve mean gradient measures 3.0 mmHg. Aortic valve peak gradient measures 7.1 mmHg. Aortic valve area, by VTI measures 2.79 cm. Pulmonic Valve: The pulmonic valve was normal in structure. Pulmonic valve regurgitation is not visualized. No evidence of pulmonic stenosis. Aorta: The aortic root is normal in size and structure. Venous: The inferior vena cava is normal in size with greater than 50% respiratory variability, suggesting right atrial pressure of 3 mmHg. IAS/Shunts: No atrial level shunt detected by color flow Doppler.  LEFT VENTRICLE PLAX 2D LVIDd:         5.00 cm  Diastology LVIDs:         3.80 cm  LV e' medial:    5.48 cm/s LV PW:         1.60 cm  LV E/e' medial:  17.0 LV IVS:        1.70 cm  LV e' lateral:   7.13 cm/s LVOT diam:     2.10 cm  LV E/e' lateral: 13.1 LV SV:         84 LV SV Index:   44 LVOT Area:     3.46 cm  RIGHT VENTRICLE             IVC RV Basal diam:  3.50 cm     IVC diam: 1.80 cm RV S prime:     14.33 cm/s TAPSE (M-mode):  2.5 cm LEFT ATRIUM             Index       RIGHT ATRIUM           Index LA diam:        4.10 cm 2.14 cm/m  RA Area:     21.20 cm LA Vol (A2C):   78.6 ml 40.96 ml/m RA Volume:   57.60 ml  30.02 ml/m LA Vol (A4C):   47.7 ml 24.86 ml/m LA Biplane Vol: 62.7 ml 32.67 ml/m  AORTIC VALVE AV Area (Vmax):    2.85  cm AV Area (Vmean):   2.85 cm AV Area (VTI):     2.79 cm AV Vmax:           133.50 cm/s AV Vmean:          85.250 cm/s AV VTI:            0.302 m AV Peak Grad:      7.1 mmHg AV Mean Grad:      3.0 mmHg LVOT Vmax:         110.00 cm/s LVOT Vmean:        70.100 cm/s LVOT VTI:          0.243 m LVOT/AV VTI ratio: 0.81 AI PHT:            708 msec  AORTA Ao Root diam: 3.80 cm MITRAL VALVE               TRICUSPID VALVE MV Area (PHT): 2.42 cm    TR Peak grad:   23.2 mmHg MV Peak grad:  3.9 mmHg    TR Vmax:        241.00 cm/s MV Mean grad:  1.0 mmHg MV Vmax:       0.99 m/s    SHUNTS MV Vmean:      52.8 cm/s   Systemic VTI:  0.24 m MV Decel Time: 313 msec    Systemic Diam: 2.10 cm MV E velocity: 93.40 cm/s MV A velocity: 74.10 cm/s MV E/A ratio:  1.26 Mihai Croitoru MD Electronically signed by Sanda Klein MD Signature Date/Time: 05/19/2020/12:51:17 PM    Final     Review Of Systems Constitutional: No fever, chills, Chronic weight loss. Eyes: No vision change, wears glasses. No discharge or pain. Ears: No hearing loss, No tinnitus. Respiratory: No asthma, positive COPD, pneumonias. No shortness of breath. No hemoptysis. Cardiovascular: Positive chest pain, palpitation, no leg edema. Gastrointestinal: No nausea, vomiting, diarrhea, constipation. No GI bleed. No hepatitis. Genitourinary: No dysuria, hematuria, kidney stone. No incontinance. Neurological: No headache, h/o stroke, no seizures.  Psychiatry: No psych facility admission for anxiety, depression, suicide. No detox. Skin: No rash. Musculoskeletal: Positive joint pain, no fibromyalgia. No neck pain, back pain. Lymphadenopathy: No  lymphadenopathy. Hematology: No anemia or easy bruising.   Blood pressure (!) 146/59, pulse (!) 58, temperature 98 F (36.7 C), temperature source Oral, resp. rate 18, height 5\' 9"  (1.753 m), weight 74.3 kg, SpO2 96 %. Body mass index is 24.2 kg/m. General appearance: alert, cooperative, appears stated age and no distress Head: Normocephalic, atraumatic. Eyes: Blue eyes, pink conjunctiva, corneas clear.  Neck: No adenopathy, no carotid bruit, no JVD, supple, symmetrical, trachea midline and thyroid not enlarged. Resp: Clear to auscultation bilaterally. Cardio: Regular rate and rhythm, S1, S2 normal, II/VI systolic murmur, no click, rub or gallop GI: Soft, non-tender; bowel sounds normal; no organomegaly. Extremities: No edema, cyanosis or clubbing. Skin: Warm and dry.  Neurologic: Alert and oriented X 3, normal strength. Normal coordination.  Assessment/Plan Chest pain, MI ruled out Paroxysmal atrial fibrillation, CHA2DS2VASc score of 7 CAD PAD Type 2 DM Hypertension Hyperlipidemia COPD GERD  Plan:  Change amlodipine to diltiazem for improved heart rate control. Will decrease metoprolol dose if needed if has significant bradycardia with diltiazem use in patient with COPD. Continue Amiodarone, Metoprolol, Ramipril and Eliquis. Patient had non-obstructive CAD with cardiac cath in 2019. I offered cardiac cath with or without nuclear stress test. Patient prefers to get tests done at [facility] by his cardiologist. Patient agrees  to   Time spent: Review of old records, Lab, x-rays, EKG, other cardiac tests, examination, discussion with patient/Physician over 70 minutes.  Birdie Riddle, MD  05/20/2020, 12:26 PM

## 2020-05-20 NOTE — Progress Notes (Signed)
Patient converted to NSR/SB shortly after IV morphine given.  Continued to have dull chest pain with some radiation down left arm.  Spoke with MD - encouraged patient to take SL nitro and EKG obtained.  Nitro given with no effect.  BP s/p SL nitro 909 systolic and patient politely refusing further pain medication or intervention at this time.  Will continue to monitor.

## 2020-05-20 NOTE — Progress Notes (Signed)
PROGRESS NOTE    BRYSYN BRANDENBERGER   WRU:[id number]  DOB: 01/08/1941  DOA: 05/18/2020     1  PCP: [facility]  CC: CP  Hospital Course: Mr. Sato is an 79 yo male with PMH dCHF, PAD, GAVE, IDA, PAF, COPD, GERD, HLD who presented to the hospital with chest pain.  These have been ongoing intermittently for several months per patient report.  He seemed to associate them with episodes of A. fib with RVR.  He typically follows with the [facility] for his cardiologist.  His amiodarone had recently been changed from twice daily to daily but the overall same total daily dose. On admission he was found to be hypertensive.  He does have a blood pressure log with him which was reviewed and reveals mostly controlled blood pressures at home with a few elevated levels in the 140s SBP noted. He was started on further blood pressure control regimen with addition of amlodipine. He did have breakthrough A. fib with RVR on the evening of 05/19/2020 with some associated chest pain.  Troponins remained flat with no significant EKG changes.  Cardiology was consulted and he was offered cardiac cath with or without nuclear stress testing.  The patient declined and wished for follow-up with the [place].  The patient's amlodipine was transitioned to diltiazem per cardiology for improved heart rate control.   Interval History:  Last night he went into A. fib with RVR again and developed some chest pain.  He was evaluated by cardiology today but declined further work-up including offer of cath and/or stress testing.  He wishes to do this with the [place].  Amlodipine was changed to diltiazem. We will watch him overnight and monitor response.  Old records reviewed in assessment of this patient  ROS: Constitutional: negative for chills and fevers, Respiratory: negative for cough, Cardiovascular: positive for chest pain and Gastrointestinal: negative for abdominal pain  Assessment & Plan: * Chest pain -Troponins flat (7>9>9) and  reassuring. EKG with sinus brady, TWI in ?septal leads - suspect CP in part due to uncontrolled BP on admission - started on amlodipine and monitor CP and BP - repeat trop and EKG if severe CP (>7/10) -Patient requested cardiology consult on 05/20/2020.  He was evaluated and offered cardiac cath with or without nuclear stress test but declined and wished to have this done by the [place]  Essential hypertension - BP uncontrolled on admission - add amlodipine; this has now been changed to diltiazem per cardiology on 05/20/2020 - continue ramipril - home BP log reviewed, mostly SBP 120s but some 140s noted -Monitor overnight on diltiazem with other meds including metoprolol and ramipril for blood pressure tolerance.  If stable, possible discharge on Monday  Chronic diastolic (congestive) heart failure (HCC) - s/p lasix on admission - BNP 377 - echo on admission: EF 60-65%, Gr 2 DD; no LV WMA (reassuring) - may need low dose lasix at discharge  - monitor output   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 esophagitis -Continue Protonix  Chronic kidney disease, stage 3a (HCC) -Baseline creatinine 1.3-1.4 -Currently at baseline, continue monitoring while on diuresis  Mixed diabetic hyperlipidemia associated with type 2 diabetes mellitus ([place]) -Does not tolerate statins -Continue Zetia -LDL 71, HDL 29, TC 125, TG 127  PAD (peripheral artery disease) (HCC) -Continue Eliquis and Plavix  Type 2 diabetes mellitus with stage 3a chronic kidney disease, without long-term current use of insulin (HCC) -SSI and CBG monitoring -A1c 6.1%  Coronary artery disease involving native coronary artery of native  heart -Continue Eliquis, Plavix, Zetia, Lopressor, ramipril - known vasculopath with PAD (s/p multiple interventions, see H&P)  AF (paroxysmal atrial fibrillation) (HCC) -Continue amiodarone, metoprolol -Continue Eliquis   Antimicrobials: None  DVT prophylaxis: Eliquis Code Status: Full Family Communication: None  present Disposition Plan: Status is: Inpatient  Remains inpatient appropriate because:Ongoing active pain requiring inpatient pain management, IV treatments appropriate due to intensity of illness or inability to take PO and Inpatient level of care appropriate due to severity of illness   Dispo: The patient is from: Home              Anticipated d/c is to: Home              Anticipated d/c date is: 1 day              Patient currently is not medically stable to d/c.  Objective: Blood pressure (!) 146/59, pulse (!) 58, temperature 98 F (36.7 C), temperature source Oral, resp. rate 18, height 5\' 9"  (1.753 m), weight 74.3 kg, SpO2 96 %.  Examination: General appearance: alert, cooperative and no distress Head: Normocephalic, without obvious abnormality, atraumatic Eyes: EOMI Lungs: clear to auscultation bilaterally Heart: S1, S2 normal and Bradycardic, regular rhythm Abdomen: normal findings: bowel sounds normal and soft, non-tender Extremities: No edema Skin: mobility and turgor normal Neurologic: Grossly normal  Consultants:   Cardiology  Procedures:   None  Data Reviewed: I have personally reviewed following labs and imaging studies Results for orders placed or performed during the hospital encounter of 05/18/20 (from the past 24 hour(s))  Glucose, capillary     Status: Abnormal   Collection Time: 05/19/20  5:06 PM  Result Value Ref Range   Glucose-Capillary 111 (H) 70 - 99 mg/dL   Comment 1 Notify RN    Comment 2 Document in Chart   Troponin I (High Sensitivity)     Status: None   Collection Time: 05/19/20  6:54 PM  Result Value Ref Range   Troponin I (High Sensitivity) 8 <18 ng/L  Troponin I (High Sensitivity)     Status: None   Collection Time: 05/19/20  8:53 PM  Result Value Ref Range   Troponin I (High Sensitivity) 8 <18 ng/L  Glucose, capillary     Status: Abnormal   Collection Time: 05/19/20  9:08 PM  Result Value Ref Range   Glucose-Capillary 143 (H) 70 -  99 mg/dL  Troponin I (High Sensitivity)     Status: None   Collection Time: 05/20/20 12:19 AM  Result Value Ref Range   Troponin I (High Sensitivity) 10 <18 ng/L  Basic metabolic panel     Status: Abnormal   Collection Time: 05/20/20  3:49 AM  Result Value Ref Range   Sodium 140 135 - 145 mmol/L   Potassium 4.1 3.5 - 5.1 mmol/L   Chloride 101 98 - 111 mmol/L   CO2 27 22 - 32 mmol/L   Glucose, Bld 98 70 - 99 mg/dL   BUN 24 (H) 8 - 23 mg/dL   Creatinine, Ser 1.54 (H) 0.61 - 1.24 mg/dL   Calcium 9.0 8.9 - 10.3 mg/dL   GFR, Estimated 45 (L) >60 mL/min   Anion gap 12 5 - 15  CBC with Differential/Platelet     Status: Abnormal   Collection Time: 05/20/20  3:49 AM  Result Value Ref Range   WBC 4.3 4.0 - 10.5 K/uL   RBC 4.02 (L) 4.22 - 5.81 MIL/uL   Hemoglobin 11.9 (L) 13.0 -  17.0 g/dL   HCT 36.9 (L) 39 - 52 %   MCV 91.8 80.0 - 100.0 fL   MCH 29.6 26.0 - 34.0 pg   MCHC 32.2 30.0 - 36.0 g/dL   RDW 13.1 11.5 - 15.5 %   Platelets 190 150 - 400 K/uL   nRBC 0.0 0.0 - 0.2 %   Neutrophils Relative % 40 %   Neutro Abs 1.7 1.7 - 7.7 K/uL   Lymphocytes Relative 35 %   Lymphs Abs 1.5 0.7 - 4.0 K/uL   Monocytes Relative 21 %   Monocytes Absolute 0.9 0.1 - 1.0 K/uL   Eosinophils Relative 3 %   Eosinophils Absolute 0.1 0.0 - 0.5 K/uL   Basophils Relative 1 %   Basophils Absolute 0.0 0.0 - 0.1 K/uL   Immature Granulocytes 0 %   Abs Immature Granulocytes 0.01 0.00 - 0.07 K/uL  Magnesium     Status: None   Collection Time: 05/20/20  3:49 AM  Result Value Ref Range   Magnesium 1.7 1.7 - 2.4 mg/dL  Glucose, capillary     Status: None   Collection Time: 05/20/20  6:05 AM  Result Value Ref Range   Glucose-Capillary 92 70 - 99 mg/dL  Glucose, capillary     Status: Abnormal   Collection Time: 05/20/20 11:18 AM  Result Value Ref Range   Glucose-Capillary 108 (H) 70 - 99 mg/dL   Comment 1 Notify RN    Comment 2 Document in Chart   Glucose, capillary     Status: None   Collection Time:  05/20/20  4:11 PM  Result Value Ref Range   Glucose-Capillary 98 70 - 99 mg/dL   Comment 1 Notify RN    Comment 2 Document in Chart     Recent Results (from the past 240 hour(s))  Resp Panel by RT-PCR (Flu A&B, Covid) Nasopharyngeal Swab     Status: None   Collection Time: 05/18/20  7:48 PM   Specimen: Nasopharyngeal Swab; Nasopharyngeal(NP) swabs in vial transport medium  Result Value Ref Range Status   SARS Coronavirus 2 by RT PCR NEGATIVE NEGATIVE Final    Comment: (NOTE) SARS-CoV-2 target nucleic acids are NOT DETECTED.  The SARS-CoV-2 RNA is generally detectable in upper respiratory specimens during the acute phase of infection. The lowest concentration of SARS-CoV-2 viral copies this assay can detect is 138 copies/mL. A negative result does not preclude SARS-Cov-2 infection and should not be used as the sole basis for treatment or other patient management decisions. A negative result may occur with  improper specimen collection/handling, submission of specimen other than nasopharyngeal swab, presence of viral mutation(s) within the areas targeted by this assay, and inadequate number of viral copies(<138 copies/mL). A negative result must be combined with clinical observations, patient history, and epidemiological information. The expected result is Negative.  Fact Sheet for Patients:  EntrepreneurPulse.com.au  Fact Sheet for Healthcare Providers:  IncredibleEmployment.be  This test is no t yet approved or cleared by the [place] FDA and  has been authorized for detection and/or diagnosis of SARS-CoV-2 by FDA under an Emergency Use Authorization (EUA). This EUA will remain  in effect (meaning this test can be used) for the duration of the COVID-19 declaration under Section 564(b)(1) of the Act, 21 U.S.C.section 360bbb-3(b)(1), unless the authorization is terminated  or revoked sooner.       Influenza A by PCR NEGATIVE NEGATIVE  Final   Influenza B by PCR NEGATIVE NEGATIVE Final    Comment: (NOTE) The  Xpert Xpress SARS-CoV-2/FLU/RSV plus assay is intended as an aid in the diagnosis of influenza from Nasopharyngeal swab specimens and should not be used as a sole basis for treatment. Nasal washings and aspirates are unacceptable for Xpert Xpress SARS-CoV-2/FLU/RSV testing.  Fact Sheet for Patients: EntrepreneurPulse.com.au  Fact Sheet for Healthcare Providers: IncredibleEmployment.be  This test is not yet approved or cleared by the [place] FDA and has been authorized for detection and/or diagnosis of SARS-CoV-2 by FDA under an Emergency Use Authorization (EUA). This EUA will remain in effect (meaning this test can be used) for the duration of the COVID-19 declaration under Section 564(b)(1) of the Act, 21 U.S.C. section 360bbb-3(b)(1), unless the authorization is terminated or revoked.  Performed at [facility], [place] [address], [place], [place] [address]      Radiology Studies: ECHOCARDIOGRAM COMPLETE  Result Date: 05/19/2020    ECHOCARDIOGRAM REPORT   Patient Name:   BARUC TUGWELL Date of Exam: 05/19/2020 Medical Rec #:  [id number]     Height:       69.0 in Accession #:    [id number]    Weight:       168.1 lb Date of Birth:  29-Nov-1940    BSA:          1.919 m Patient Age:    57 years      BP:           130/42 mmHg Patient Gender: M             HR:           55 bpm. Exam Location:  Inpatient Procedure: 2D Echo, Cardiac Doppler and Color Doppler Indications:    CHF-Acute Diastolic  History:        Patient has prior history of Echocardiogram examinations, most                 recent 01/12/2018. CAD, Arrythmias:Atrial Fibrillation; Risk                 Factors:Hypertension, Diabetes, Dyslipidemia and Former Smoker.                 PAD. GERD.  Sonographer:    Clayton Lefort RDCS (AE) Referring Phys: [id number] Sherryll Burger [place]  Sonographer Comments: Suboptimal parasternal  window. IMPRESSIONS  1. Left ventricular ejection fraction, by estimation, is 60 to 65%. The left ventricle has normal function. The left ventricle has no regional wall motion abnormalities. Left ventricular diastolic parameters are consistent with Grade II diastolic dysfunction (pseudonormalization). Elevated left atrial pressure.  2. Right ventricular systolic function is normal. The right ventricular size is normal. There is normal pulmonary artery systolic pressure. The estimated right ventricular systolic pressure is 38.9 mmHg.  3. Left atrial size was mild to moderately dilated.  4. Right atrial size was mildly dilated.  5. The mitral valve is normal in structure. Trivial mitral valve regurgitation. No evidence of mitral stenosis.  6. The aortic valve is normal in structure. Aortic valve regurgitation is trivial. Mild aortic valve sclerosis is present, with no evidence of aortic valve stenosis.  7. The inferior vena cava is normal in size with greater than 50% respiratory variability, suggesting right atrial pressure of 3 mmHg. FINDINGS  Left Ventricle: Left ventricular ejection fraction, by estimation, is 60 to 65%. The left ventricle has normal function. The left ventricle has no regional wall motion abnormalities. The left ventricular internal cavity size was normal in size. There is  no left ventricular  hypertrophy. Left ventricular diastolic parameters are consistent with Grade II diastolic dysfunction (pseudonormalization). Elevated left atrial pressure. Right Ventricle: The right ventricular size is normal. No increase in right ventricular wall thickness. Right ventricular systolic function is normal. There is normal pulmonary artery systolic pressure. The tricuspid regurgitant velocity is 2.41 m/s, and  with an assumed right atrial pressure of 3 mmHg, the estimated right ventricular systolic pressure is 42.7 mmHg. Left Atrium: Left atrial size was mild to moderately dilated. Right Atrium: Right atrial  size was mildly dilated. Pericardium: There is no evidence of pericardial effusion. Mitral Valve: The mitral valve is normal in structure. Trivial mitral valve regurgitation. No evidence of mitral valve stenosis. MV peak gradient, 3.9 mmHg. The mean mitral valve gradient is 1.0 mmHg. Tricuspid Valve: The tricuspid valve is normal in structure. Tricuspid valve regurgitation is trivial. No evidence of tricuspid stenosis. Aortic Valve: The aortic valve is normal in structure. Aortic valve regurgitation is trivial. Aortic regurgitation PHT measures 708 msec. Mild aortic valve sclerosis is present, with no evidence of aortic valve stenosis. Aortic valve mean gradient measures 3.0 mmHg. Aortic valve peak gradient measures 7.1 mmHg. Aortic valve area, by VTI measures 2.79 cm. Pulmonic Valve: The pulmonic valve was normal in structure. Pulmonic valve regurgitation is not visualized. No evidence of pulmonic stenosis. Aorta: The aortic root is normal in size and structure. Venous: The inferior vena cava is normal in size with greater than 50% respiratory variability, suggesting right atrial pressure of 3 mmHg. IAS/Shunts: No atrial level shunt detected by color flow Doppler.  LEFT VENTRICLE PLAX 2D LVIDd:         5.00 cm  Diastology LVIDs:         3.80 cm  LV e' medial:    5.48 cm/s LV PW:         1.60 cm  LV E/e' medial:  17.0 LV IVS:        1.70 cm  LV e' lateral:   7.13 cm/s LVOT diam:     2.10 cm  LV E/e' lateral: 13.1 LV SV:         84 LV SV Index:   44 LVOT Area:     3.46 cm  RIGHT VENTRICLE             IVC RV Basal diam:  3.50 cm     IVC diam: 1.80 cm RV S prime:     14.33 cm/s TAPSE (M-mode): 2.5 cm LEFT ATRIUM             Index       RIGHT ATRIUM           Index LA diam:        4.10 cm 2.14 cm/m  RA Area:     21.20 cm LA Vol (A2C):   78.6 ml 40.96 ml/m RA Volume:   57.60 ml  30.02 ml/m LA Vol (A4C):   47.7 ml 24.86 ml/m LA Biplane Vol: 62.7 ml 32.67 ml/m  AORTIC VALVE AV Area (Vmax):    2.85 cm AV Area  (Vmean):   2.85 cm AV Area (VTI):     2.79 cm AV Vmax:           133.50 cm/s AV Vmean:          85.250 cm/s AV VTI:            0.302 m AV Peak Grad:      7.1 mmHg AV Mean Grad:  3.0 mmHg LVOT Vmax:         110.00 cm/s LVOT Vmean:        70.100 cm/s LVOT VTI:          0.243 m LVOT/AV VTI ratio: 0.81 AI PHT:            708 msec  AORTA Ao Root diam: 3.80 cm MITRAL VALVE               TRICUSPID VALVE MV Area (PHT): 2.42 cm    TR Peak grad:   23.2 mmHg MV Peak grad:  3.9 mmHg    TR Vmax:        241.00 cm/s MV Mean grad:  1.0 mmHg MV Vmax:       0.99 m/s    SHUNTS MV Vmean:      52.8 cm/s   Systemic VTI:  0.24 m MV Decel Time: 313 msec    Systemic Diam: 2.10 cm MV E velocity: 93.40 cm/s MV A velocity: 74.10 cm/s MV E/A ratio:  1.26 Mihai Croitoru MD Electronically signed by Sanda Klein MD Signature Date/Time: 05/19/2020/12:51:17 PM    Final    DG Chest 2 View  Final Result      Scheduled Meds: . amiodarone  200 mg Oral Daily  . apixaban  5 mg Oral BID  . clopidogrel  75 mg Oral QHS  . diltiazem  60 mg Oral Q8H  . ezetimibe  10 mg Oral QPM  . finasteride  5 mg Oral Daily  . hydrochlorothiazide  12.5 mg Oral Daily  . insulin aspart  0-15 Units Subcutaneous TID AC & HS  . ketorolac  1 drop Right Eye Daily  . memantine  10 mg Oral q AM  . metoprolol tartrate  50 mg Oral BID  . pantoprazole  40 mg Oral Daily  . ramipril  10 mg Oral BID  . rivastigmine  6 mg Oral BID   PRN Meds: acetaminophen, nitroGLYCERIN, ondansetron (ZOFRAN) IV Continuous Infusions:   LOS: 1 day  Time spent: Greater than 50% of the 35 minute visit was spent in counseling/coordination of care for the patient as laid out in the A&P.   Dwyane Dee, MD [facility] 05/20/2020, 4:36 PM

## 2020-05-21 ENCOUNTER — Other Ambulatory Visit (HOSPITAL_COMMUNITY): Payer: Self-pay | Admitting: Internal Medicine

## 2020-05-21 DIAGNOSIS — N1831 Chronic kidney disease, stage 3a: Secondary | ICD-10-CM | POA: Diagnosis not present

## 2020-05-21 DIAGNOSIS — R079 Chest pain, unspecified: Secondary | ICD-10-CM | POA: Diagnosis not present

## 2020-05-21 DIAGNOSIS — N179 Acute kidney failure, unspecified: Secondary | ICD-10-CM | POA: Diagnosis not present

## 2020-05-21 DIAGNOSIS — I25119 Atherosclerotic heart disease of native coronary artery with unspecified angina pectoris: Secondary | ICD-10-CM | POA: Diagnosis not present

## 2020-05-21 LAB — CBC WITH DIFFERENTIAL/PLATELET
Abs Immature Granulocytes: 0.02 10*3/uL (ref 0.00–0.07)
Basophils Absolute: 0 10*3/uL (ref 0.0–0.1)
Basophils Relative: 0 %
Eosinophils Absolute: 0.2 10*3/uL (ref 0.0–0.5)
Eosinophils Relative: 3 %
HCT: 34.6 % — ABNORMAL LOW (ref 39.0–52.0)
Hemoglobin: 11.4 g/dL — ABNORMAL LOW (ref 13.0–17.0)
Immature Granulocytes: 0 %
Lymphocytes Relative: 32 %
Lymphs Abs: 1.6 10*3/uL (ref 0.7–4.0)
MCH: 30 pg (ref 26.0–34.0)
MCHC: 32.9 g/dL (ref 30.0–36.0)
MCV: 91.1 fL (ref 80.0–100.0)
Monocytes Absolute: 1.1 10*3/uL — ABNORMAL HIGH (ref 0.1–1.0)
Monocytes Relative: 22 %
Neutro Abs: 2.1 10*3/uL (ref 1.7–7.7)
Neutrophils Relative %: 43 %
Platelets: 176 10*3/uL (ref 150–400)
RBC: 3.8 MIL/uL — ABNORMAL LOW (ref 4.22–5.81)
RDW: 13 % (ref 11.5–15.5)
WBC: 5 10*3/uL (ref 4.0–10.5)
nRBC: 0 % (ref 0.0–0.2)

## 2020-05-21 LAB — GLUCOSE, CAPILLARY
Glucose-Capillary: 124 mg/dL — ABNORMAL HIGH (ref 70–99)
Glucose-Capillary: 103 mg/dL — ABNORMAL HIGH (ref 70–99)
Glucose-Capillary: 147 mg/dL — ABNORMAL HIGH (ref 70–99)

## 2020-05-21 LAB — BASIC METABOLIC PANEL
Anion gap: 10 (ref 5–15)
BUN: 31 mg/dL — ABNORMAL HIGH (ref 8–23)
CO2: 24 mmol/L (ref 22–32)
Calcium: 8.6 mg/dL — ABNORMAL LOW (ref 8.9–10.3)
Chloride: 101 mmol/L (ref 98–111)
Creatinine, Ser: 1.84 mg/dL — ABNORMAL HIGH (ref 0.61–1.24)
GFR, Estimated: 37 mL/min — ABNORMAL LOW (ref >60)
Glucose, Bld: 112 mg/dL — ABNORMAL HIGH (ref 70–99)
Potassium: 3.9 mmol/L (ref 3.5–5.1)
Sodium: 135 mmol/L (ref 135–145)

## 2020-05-21 LAB — MAGNESIUM: Magnesium: 1.8 mg/dL (ref 1.7–2.4)

## 2020-05-21 MED ORDER — AMIODARONE HCL 200 MG PO TABS: 200.0000 mg | ORAL_TABLET | Freq: Every day | ORAL | 3 refills | Status: DC

## 2020-05-21 MED ORDER — DILTIAZEM HCL 60 MG PO TABS: 60.0000 mg | ORAL_TABLET | Freq: Two times a day (BID) | ORAL | Status: DC

## 2020-05-21 MED ORDER — APIXABAN 2.5 MG PO TABS: 2.5000 mg | ORAL_TABLET | Freq: Two times a day (BID) | ORAL | Status: DC

## 2020-05-21 MED ORDER — APIXABAN 2.5 MG PO TABS
2.5000 mg | ORAL_TABLET | Freq: Two times a day (BID) | ORAL | 3 refills | Status: DC
Start: 2020-05-21 — End: 2020-05-21

## 2020-05-21 MED ORDER — DILTIAZEM HCL 60 MG PO TABS
60.0000 mg | ORAL_TABLET | Freq: Two times a day (BID) | ORAL | 3 refills | Status: DC
Start: 2020-05-21 — End: 2020-06-02

## 2020-05-21 MED ORDER — METOPROLOL TARTRATE 25 MG PO TABS
25.0000 mg | ORAL_TABLET | Freq: Two times a day (BID) | ORAL | 3 refills | Status: DC
Start: 2020-05-21 — End: 2020-05-21

## 2020-05-21 MED ORDER — METOPROLOL TARTRATE 25 MG PO TABS
25.0000 mg | ORAL_TABLET | Freq: Two times a day (BID) | ORAL | Status: DC
  Administered 2020-05-21: 25 mg via ORAL
  Filled 2020-05-21: qty 1

## 2020-05-21 MED FILL — AMIODARONE HCL 200 MG TAB: 200 | 14 days supply | Qty: 14 | Fill #0

## 2020-05-21 MED FILL — METOPROLOL TARTRATE 25 MG T: 25 | 14 days supply | Qty: 29 | Fill #0

## 2020-05-21 MED FILL — ELIQUIS 2.5 MG TABLET: 2.5 | 30 days supply | Qty: 60 | Fill #0

## 2020-05-21 MED FILL — dilTIAZem HCL 60 MG TABS: 60 | 14 days supply | Qty: 29 | Fill #0

## 2020-05-21 NOTE — TOC Transition Note (Signed)
Transition of Care [facility]) - CM/SW Discharge Note   Patient Details  Name: RENE SIZELOVE MRN: [id number] Date of Birth: 04/09/1941  [facility]) CM/SW Contact:  Zenon Mayo, RN Phone Number: 05/21/2020, 3:14 PM   Clinical Narrative:    Patient is for dc today, he has transportation at dc. He has no needs   Final next level of care: Home/Self Care Barriers to Discharge: No Barriers Identified   Patient Goals and CMS Choice Patient states their goals for this hospitalization and ongoing recovery are:: to go home   Choice offered to / list presented to : NA  Discharge Placement                       Discharge Plan and Services   Discharge Planning Services: CM Consult Post Acute Care Choice: NA            DME Agency: NA       HH Arranged: NA          Social Determinants of Health (SDOH) Interventions     Readmission Risk Interventions Readmission Risk Prevention Plan 05/21/2020 01/21/2020 07/26/2019  Post Dischage Appt - - Complete  Medication Screening - - Complete  Transportation Screening Complete Complete Complete  PCP or Specialist Appt within 3-5 Days Complete - -  HRI or Home Care Consult Complete Complete -  Social Work Consult for [place] Planning/Counseling Complete Complete -  Palliative Care Screening Not Applicable Not Applicable -  Medication Review [employer] photographer) Complete Complete -  Some recent data might be hidden

## 2020-05-21 NOTE — Discharge Summary (Signed)
Physician Discharge Summary   Donald Gordon YIR:[id number] DOB: 05/01/1941 DOA: 05/18/2020  PCP: [facility]  Admit date: 05/18/2020 Discharge date: 05/21/2020  Admitted From: home Disposition:  home Discharging physician: Dwyane Dee, MD  Recommendations for Outpatient Follow-up:  1. Repeat BMP at follow up 2. Adjust BP meds; patient keeps BP journal 3. May need stress test at follow up; patient declined cath and stress test inpatient  Patient discharged to home in Discharge Condition: stable CODE STATUS: Full Diet recommendation:  Diet Orders (From admission, onward)    Start     Ordered   05/21/20 0000  Diet - low sodium heart healthy        05/21/20 1447   05/19/20 1026  Diet heart healthy/carb modified Room service appropriate? Yes; Fluid consistency: Thin  Diet effective now       Question Answer Comment  Diet-HS Snack? Nothing   Room service appropriate? Yes   Fluid consistency: Thin      05/19/20 1025          Hospital Course: Mr. Schramm is an 79 yo male with PMH dCHF, PAD, GAVE, IDA, PAF, COPD, GERD, HLD who presented to the hospital with chest pain.  These have been ongoing intermittently for several months per patient report.  He seemed to associate them with episodes of A. fib with RVR.  He typically follows with the [facility] for his cardiologist.  His amiodarone had recently been changed from twice daily to daily but the overall same total daily dose. On admission he was found to be hypertensive.  He does have a blood pressure log with him which was reviewed and reveals mostly controlled blood pressures at home with a few elevated levels in the 140s SBP noted. He was started on further blood pressure control regimen with addition of amlodipine. He did have breakthrough A. fib with RVR on the evening of 05/19/2020 with some associated chest pain.  Troponins remained flat with no significant EKG changes.  Cardiology was consulted and he was offered cardiac cath  with or without nuclear stress testing.  The patient declined and wished for follow-up with the [place].  The patient's amlodipine was transitioned to diltiazem per cardiology for improved heart rate control. His blood pressure remained relatively stable with further monitoring.  His heart rate was bradycardic in the low 50s.  His diltiazem and metoprolol were further adjusted prior to discharge.  He was recommended to follow-up closely with his cardiologist at the [facility] upon discharge and if unable to arrange, then to follow-up with Dr. Doylene Canard instead.   * Chest pain -Troponins flat (7>9>9) and reassuring. EKG with sinus brady, TWI in ?septal leads - suspect CP in part due to uncontrolled BP on admission - started on amlodipine and monitor CP and BP; changed to diltiazem per cardiology - repeat trop and EKG if severe CP (>7/10) -Patient requested cardiology consult on 05/20/2020.  He was evaluated and offered cardiac cath with or without nuclear stress test but declined and wished to have this done by the [facility] -Patient chest pain-free at time of discharge  Essential hypertension - BP uncontrolled on admission - add amlodipine; this has now been changed to diltiazem per cardiology on 05/20/2020 - continue ramipril - home BP log reviewed, mostly SBP 120s but some 140s noted -Further med adjustment prior to discharge.  He will be sent home on diltiazem 60 mg twice daily and metoprolol 25 mg twice daily - [facility] if [facility] then with Dr. Doylene Canard  Chronic diastolic (congestive) heart failure (HCC) - s/p lasix on admission - BNP 377 - echo on admission: EF 60-65%, Gr 2 DD; no LV WMA (reassuring)  Acute renal failure superimposed on stage 3a chronic kidney disease (HCC) -Baseline creatinine 1.3-1.4 -Currently at baseline on admission - creat uptrended with lasix which was discontinued - needs close follow up and repeat labs  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  esophagitis -Continue Protonix  Mixed diabetic hyperlipidemia associated with type 2 diabetes mellitus ([place]) -Does not tolerate statins -Continue Zetia -LDL 71, HDL 29, TC 125, TG 127  PAD (peripheral artery disease) (HCC) -Continue Eliquis and Plavix  Type 2 diabetes mellitus with stage 3a chronic kidney disease, without long-term current use of insulin (HCC) -SSI and CBG monitoring -A1c 6.1%  Coronary artery disease involving native coronary artery of native heart -Continue Eliquis, Plavix, Zetia, Lopressor, ramipril - known vasculopath with PAD (s/p multiple interventions, see H&P)  AF (paroxysmal atrial fibrillation) (HCC) -Continue amiodarone, metoprolol -Continue Eliquis; renally adjusted at discharge    The patient's chronic medical conditions were treated accordingly per the patient's home medication regimen except as noted.  On day of discharge, patient was felt deemed stable for discharge. Patient/family member advised to call PCP or come back to ER if needed.   Principal Diagnosis: Chest pain  Discharge Diagnoses: Active Hospital Problems   Diagnosis Date Noted  . Chest pain 05/18/2020    Priority: High  . Essential hypertension 07/02/2014    Priority: Medium  . Acute renal failure superimposed on stage 3a chronic kidney disease ([place]) 05/18/2020    Priority: Low  . Chronic diastolic (congestive) heart failure ([place]) 05/18/2020    Priority: Low  . Mixed diabetic hyperlipidemia associated with type 2 diabetes mellitus ([place]) 05/18/2020  . 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 esophagitis 05/18/2020  . PAD (peripheral artery disease) ([place]) 07/23/2019  . AF (paroxysmal atrial fibrillation) ([place]) 07/02/2014  . Type 2 diabetes mellitus with stage 3a chronic kidney disease, without long-term current use of insulin ([place]) 07/02/2014  . Coronary artery disease involving native coronary artery of native heart 07/02/2014    Resolved Hospital Problems  No resolved problems to display.     Discharge Instructions    Diet - low sodium heart healthy   Complete by: As directed    Increase activity slowly   Complete by: As directed      Allergies as of 05/21/2020      Reactions   Oxycodone Nausea Only   Statins Other (See Comments)   Muscle and Bone pain Muscle and Bone pain   Donepezil Other (See Comments)   [facility]   Imipramine Other (See Comments)   [facility]   Oxybutynin Chloride Swelling, Other (See Comments)   [facility]      Medication List    TAKE these medications   acetaminophen 500 MG tablet Commonly known as: TYLENOL Take 500 mg by mouth 2 (two) times daily as needed (for pain or headaches).   amiodarone 200 MG tablet Commonly known as: PACERONE Take 1 tablet (200 mg total) by mouth daily. Start taking on: May 22, 2020 What changed:   medication strength  how much to take  when to take this   apixaban 2.5 MG Tabs tablet Commonly known as: Eliquis Take 1 tablet (2.5 mg total) by mouth 2 (two) times daily. What changed:   medication strength  how much to take   clopidogrel 75 MG tablet Commonly known  as: PLAVIX Take 1 tablet (75 mg total) by mouth daily. What changed: when to take this   diltiazem 60 MG tablet Commonly known as: CARDIZEM Take 1 tablet (60 mg total) by mouth every 12 (twelve) hours.   ezetimibe 10 MG tablet Commonly known as: ZETIA Take 10 mg by mouth every evening.   finasteride 5 MG tablet Commonly known as: PROSCAR Take 5 mg by mouth daily.   hydrochlorothiazide 12.5 MG capsule Commonly known as: MICROZIDE Take 12.5 mg by mouth daily.   IRON PO Take 1 tablet by mouth daily.   ketorolac 0.5 % ophthalmic solution Commonly known as: ACULAR Place 1 drop into the right eye daily.   memantine 10 MG tablet Commonly known as: NAMENDA Take 10 mg by mouth in the morning.   metoprolol tartrate 25 MG tablet Commonly known as: LOPRESSOR Take 1 tablet (25 mg total) by mouth 2 (two) times daily. What  changed:   medication strength  how much to take   nitroGLYCERIN 0.4 MG SL tablet Commonly known as: NITROSTAT Place 0.4 mg under the tongue every 5 (five) minutes as needed for chest pain.   pantoprazole 40 MG tablet Commonly known as: PROTONIX Take 40 mg by mouth daily.   ramipril 10 MG capsule Commonly known as: ALTACE Take 10 mg by mouth 2 (two) times daily.   rivastigmine 6 MG capsule Commonly known as: EXELON Take 6 mg by mouth 2 (two) times daily.   VITAMIN B-12 PO Take 1 tablet by mouth daily.   VITAMIN D3 PO Take 1 tablet by mouth daily.       Follow-up Information    Dixie Dials, MD. Schedule an appointment as soon as possible for a visit in 1 week(s).   Specialty: Cardiology Why: as needed if can not see your own Cardiologist Contact information: [address] [place] [address] [place]. Schedule an appointment as soon as possible for a visit in 1 week(s).   Specialty: General Practice Contact information: [place] [address] [phone]        Evans Lance, MD .   Specialty: Cardiology Contact information: [phone] [place] [address] [phone]              Allergies  Allergen Reactions  . Oxycodone Nausea Only  . Statins Other (See Comments)    Muscle and Bone pain Muscle and Bone pain  . Donepezil Other (See Comments)    [facility]  . Imipramine Other (See Comments)    [facility]  . Oxybutynin Chloride Swelling and Other (See Comments)    [facility]    Consultations: Cardiology  Discharge Exam: BP 120/74 (BP Location: Right Arm)   Pulse (!) 57   Temp (!) 97.3 F (36.3 C) (Oral)   Resp 20   Ht 5\' 9"  (1.753 m)   Wt 75.4 kg Comment: scale b  SpO2 95%   BMI 24.54 kg/m  General appearance: alert, cooperative and no distress Head: Normocephalic, without obvious abnormality, atraumatic Eyes: EOMI Lungs: clear to auscultation  bilaterally Heart: S1, S2 normal and Bradycardic, regular rhythm Abdomen: normal findings: bowel sounds normal and soft, non-tender Extremities: No edema Skin: mobility and turgor normal Neurologic: Grossly normal  The results of significant diagnostics from this hospitalization (including imaging, microbiology, ancillary and laboratory) are listed below for reference.   Microbiology: Recent Results (from the past 240 hour(s))  Resp  Panel by RT-PCR (Flu A&B, Covid) Nasopharyngeal Swab     Status: None   Collection Time: 05/18/20  7:48 PM   Specimen: Nasopharyngeal Swab; Nasopharyngeal(NP) swabs in vial transport medium  Result Value Ref Range Status   SARS Coronavirus 2 by RT PCR NEGATIVE NEGATIVE Final    Comment: (NOTE) SARS-CoV-2 target nucleic acids are NOT DETECTED.  The SARS-CoV-2 RNA is generally detectable in upper respiratory specimens during the acute phase of infection. The lowest concentration of SARS-CoV-2 viral copies this assay can detect is 138 copies/mL. A negative result does not preclude SARS-Cov-2 infection and should not be used as the sole basis for treatment or other patient management decisions. A negative result may occur with  improper specimen collection/handling, submission of specimen other than nasopharyngeal swab, presence of viral mutation(s) within the areas targeted by this assay, and inadequate number of viral copies(<138 copies/mL). A negative result must be combined with clinical observations, patient history, and epidemiological information. The expected result is Negative.  Fact Sheet for Patients:  EntrepreneurPulse.com.au  Fact Sheet for Healthcare Providers:  IncredibleEmployment.be  This test is no t yet approved or cleared by the [place] FDA and  has been authorized for detection and/or diagnosis of SARS-CoV-2 by FDA under an Emergency Use Authorization (EUA). This EUA will remain  in effect  (meaning this test can be used) for the duration of the COVID-19 declaration under Section 564(b)(1) of the Act, 21 U.S.C.section 360bbb-3(b)(1), unless the authorization is terminated  or revoked sooner.       Influenza A by PCR NEGATIVE NEGATIVE Final   Influenza B by PCR NEGATIVE NEGATIVE Final    Comment: (NOTE) The Xpert Xpress SARS-CoV-2/FLU/RSV plus assay is intended as an aid in the diagnosis of influenza from Nasopharyngeal swab specimens and should not be used as a sole basis for treatment. Nasal washings and aspirates are unacceptable for Xpert Xpress SARS-CoV-2/FLU/RSV testing.  Fact Sheet for Patients: EntrepreneurPulse.com.au  Fact Sheet for Healthcare Providers: IncredibleEmployment.be  This test is not yet approved or cleared by the [place] FDA and has been authorized for detection and/or diagnosis of SARS-CoV-2 by FDA under an Emergency Use Authorization (EUA). This EUA will remain in effect (meaning this test can be used) for the duration of the COVID-19 declaration under Section 564(b)(1) of the Act, 21 U.S.C. section 360bbb-3(b)(1), unless the authorization is terminated or revoked.  Performed at [facility], [place] [address], [place], [place] [address]      Labs: BNP (last 3 results) Recent Labs    05/18/20 1721  BNP 154.0*   Basic Metabolic Panel: Recent Labs  Lab 05/18/20 1419 05/18/20 1642 05/19/20 0427 05/20/20 0349 05/21/20 0205  NA 140  --  141 140 135  K 4.3  --  4.0 4.1 3.9  CL 105  --  103 101 101  CO2 25  --  26 27 24   GLUCOSE 105*  --  88 98 112*  BUN 18  --  16 24* 31*  CREATININE 1.32*  --  1.41* 1.54* 1.84*  CALCIUM 9.0  --  8.9 9.0 8.6*  MG  --  1.9 1.7 1.7 1.8  PHOS  --   --  4.5  --   --    Liver Function Tests: Recent Labs  Lab 05/18/20 1642 05/19/20 0427  AST 61*  --   ALT 83*  --   ALKPHOS 77  --   BILITOT 0.4  --   PROT 6.3*  --  ALBUMIN 3.5 3.1*   No  results for input(s): LIPASE, AMYLASE in the last 168 hours. No results for input(s): AMMONIA in the last 168 hours. CBC: Recent Labs  Lab 05/18/20 1419 05/20/20 0349 05/21/20 0205  WBC 4.6 4.3 5.0  NEUTROABS  --  1.7 2.1  HGB 11.9* 11.9* 11.4*  HCT 38.2* 36.9* 34.6*  MCV 95.0 91.8 91.1  PLT 206 190 176   Cardiac Enzymes: No results for input(s): CKTOTAL, CKMB, CKMBINDEX, TROPONINI in the last 168 hours. BNP: Invalid input(s): POCBNP CBG: Recent Labs  Lab 05/20/20 1118 05/20/20 1611 05/20/20 2112 05/21/20 0547 05/21/20 1129  GLUCAP 108* 98 124* 103* 147*   D-Dimer No results for input(s): DDIMER in the last 72 hours. Hgb A1c Recent Labs    05/19/20 0427  HGBA1C 6.1*   Lipid Profile Recent Labs    05/19/20 0427  CHOL 125  HDL 29*  LDLCALC 71  TRIG 127  CHOLHDL 4.3   Thyroid function studies No results for input(s): TSH, T4TOTAL, T3FREE, THYROIDAB in the last 72 hours.  Invalid input(s): FREET3 Anemia work up No results for input(s): VITAMINB12, FOLATE, FERRITIN, TIBC, IRON, RETICCTPCT in the last 72 hours. Urinalysis    Component Value Date/Time   COLORURINE YELLOW 08/30/2019 2315   APPEARANCEUR CLEAR 08/30/2019 2315   LABSPEC 1.019 08/30/2019 2315   PHURINE 6.0 08/30/2019 2315   GLUCOSEU NEGATIVE 08/30/2019 2315   HGBUR NEGATIVE 08/30/2019 2315   BILIRUBINUR NEGATIVE 08/30/2019 2315   KETONESUR NEGATIVE 08/30/2019 2315   PROTEINUR NEGATIVE 08/30/2019 2315   UROBILINOGEN 0.2 07/02/2014 1800   NITRITE NEGATIVE 08/30/2019 2315   LEUKOCYTESUR NEGATIVE 08/30/2019 2315   Sepsis Labs Invalid input(s): PROCALCITONIN,  WBC,  LACTICIDVEN Microbiology Recent Results (from the past 240 hour(s))  Resp Panel by RT-PCR (Flu A&B, Covid) Nasopharyngeal Swab     Status: None   Collection Time: 05/18/20  7:48 PM   Specimen: Nasopharyngeal Swab; Nasopharyngeal(NP) swabs in vial transport medium  Result Value Ref Range Status   SARS Coronavirus 2 by RT PCR  NEGATIVE NEGATIVE Final    Comment: (NOTE) SARS-CoV-2 target nucleic acids are NOT DETECTED.  The SARS-CoV-2 RNA is generally detectable in upper respiratory specimens during the acute phase of infection. The lowest concentration of SARS-CoV-2 viral copies this assay can detect is 138 copies/mL. A negative result does not preclude SARS-Cov-2 infection and should not be used as the sole basis for treatment or other patient management decisions. A negative result may occur with  improper specimen collection/handling, submission of specimen other than nasopharyngeal swab, presence of viral mutation(s) within the areas targeted by this assay, and inadequate number of viral copies(<138 copies/mL). A negative result must be combined with clinical observations, patient history, and epidemiological information. The expected result is Negative.  Fact Sheet for Patients:  EntrepreneurPulse.com.au  Fact Sheet for Healthcare Providers:  IncredibleEmployment.be  This test is no t yet approved or cleared by the [place] FDA and  has been authorized for detection and/or diagnosis of SARS-CoV-2 by FDA under an Emergency Use Authorization (EUA). This EUA will remain  in effect (meaning this test can be used) for the duration of the COVID-19 declaration under Section 564(b)(1) of the Act, 21 U.S.C.section 360bbb-3(b)(1), unless the authorization is terminated  or revoked sooner.       Influenza A by PCR NEGATIVE NEGATIVE Final   Influenza B by PCR NEGATIVE NEGATIVE Final    Comment: (NOTE) The Xpert Xpress SARS-CoV-2/FLU/RSV plus assay is intended as an  aid in the diagnosis of influenza from Nasopharyngeal swab specimens and should not be used as a sole basis for treatment. Nasal washings and aspirates are unacceptable for Xpert Xpress SARS-CoV-2/FLU/RSV testing.  Fact Sheet for Patients: EntrepreneurPulse.com.au  Fact Sheet for  Healthcare Providers: IncredibleEmployment.be  This test is not yet approved or cleared by the [place] FDA and has been authorized for detection and/or diagnosis of SARS-CoV-2 by FDA under an Emergency Use Authorization (EUA). This EUA will remain in effect (meaning this test can be used) for the duration of the COVID-19 declaration under Section 564(b)(1) of the Act, 21 U.S.C. section 360bbb-3(b)(1), unless the authorization is terminated or revoked.  Performed at [facility], [place] [address], [place], [place] [address]     Procedures/Studies: DG Chest 2 View  Result Date: 05/18/2020 CLINICAL DATA:  Chest pain. EXAM: CHEST - 2 VIEW COMPARISON:  PET-CT 04/10/2020.  Chest x-ray 11/2019. FINDINGS: Mediastinum and hilar structures normal. Heart size normal. Previously identified spiculated nodule in the medial right lung base best identified by prior PET-CT. No acute infiltrate noted. No pleural effusion or pneumothorax. Metallic density small radiopacity noted over the anterior abdomen. Degenerative change in scoliosis thoracic spine. IMPRESSION: 1. Previously identified spiculated nodular density in the medial right lung base best identified by prior PET-CT. 2. No acute abnormality identified. Electronically Signed   By: Marcello Moores  Register   On: 05/18/2020 14:47   ECHOCARDIOGRAM COMPLETE  Result Date: 05/19/2020    ECHOCARDIOGRAM REPORT   Patient Name:   Donald Gordon Date of Exam: 05/19/2020 Medical Rec #:  [id number]     Height:       69.0 in Accession #:    [id number]    Weight:       168.1 lb Date of Birth:  October 15, 1940    BSA:          1.919 m Patient Age:    89 years      BP:           130/42 mmHg Patient Gender: M             HR:           55 bpm. Exam Location:  Inpatient Procedure: 2D Echo, Cardiac Doppler and Color Doppler Indications:    CHF-Acute Diastolic  History:        Patient has prior history of Echocardiogram examinations, most                  recent 01/12/2018. CAD, Arrythmias:Atrial Fibrillation; Risk                 Factors:Hypertension, Diabetes, Dyslipidemia and Former Smoker.                 PAD. GERD.  Sonographer:    Clayton Lefort RDCS (AE) Referring Phys: [id number] [facility]ll [facility]  Sonographer Comments: Suboptimal parasternal window. IMPRESSIONS  1. Left ventricular ejection fraction, by estimation, is 60 to 65%. The left ventricle has normal function. The left ventricle has no regional wall motion abnormalities. Left ventricular diastolic parameters are consistent with Grade II diastolic dysfunction (pseudonormalization). Elevated left atrial pressure.  2. Right ventricular systolic function is normal. The right ventricular size is normal. There is normal pulmonary artery systolic pressure. The estimated right ventricular systolic pressure is 32.9 mmHg.  3. Left atrial size was mild to moderately dilated.  4. Right atrial size was mildly dilated.  5. The mitral valve is normal in structure. Trivial mitral  valve regurgitation. No evidence of mitral stenosis.  6. The aortic valve is normal in structure. Aortic valve regurgitation is trivial. Mild aortic valve sclerosis is present, with no evidence of aortic valve stenosis.  7. The inferior vena cava is normal in size with greater than 50% respiratory variability, suggesting right atrial pressure of 3 mmHg. FINDINGS  Left Ventricle: Left ventricular ejection fraction, by estimation, is 60 to 65%. The left ventricle has normal function. The left ventricle has no regional wall motion abnormalities. The left ventricular internal cavity size was normal in size. There is  no left ventricular hypertrophy. Left ventricular diastolic parameters are consistent with Grade II diastolic dysfunction (pseudonormalization). Elevated left atrial pressure. Right Ventricle: The right ventricular size is normal. No increase in right ventricular wall thickness. Right ventricular systolic function is normal. There is  normal pulmonary artery systolic pressure. The tricuspid regurgitant velocity is 2.41 m/s, and  with an assumed right atrial pressure of 3 mmHg, the estimated right ventricular systolic pressure is 03.5 mmHg. Left Atrium: Left atrial size was mild to moderately dilated. Right Atrium: Right atrial size was mildly dilated. Pericardium: There is no evidence of pericardial effusion. Mitral Valve: The mitral valve is normal in structure. Trivial mitral valve regurgitation. No evidence of mitral valve stenosis. MV peak gradient, 3.9 mmHg. The mean mitral valve gradient is 1.0 mmHg. Tricuspid Valve: The tricuspid valve is normal in structure. Tricuspid valve regurgitation is trivial. No evidence of tricuspid stenosis. Aortic Valve: The aortic valve is normal in structure. Aortic valve regurgitation is trivial. Aortic regurgitation PHT measures 708 msec. Mild aortic valve sclerosis is present, with no evidence of aortic valve stenosis. Aortic valve mean gradient measures 3.0 mmHg. Aortic valve peak gradient measures 7.1 mmHg. Aortic valve area, by VTI measures 2.79 cm. Pulmonic Valve: The pulmonic valve was normal in structure. Pulmonic valve regurgitation is not visualized. No evidence of pulmonic stenosis. Aorta: The aortic root is normal in size and structure. Venous: The inferior vena cava is normal in size with greater than 50% respiratory variability, suggesting right atrial pressure of 3 mmHg. IAS/Shunts: No atrial level shunt detected by color flow Doppler.  LEFT VENTRICLE PLAX 2D LVIDd:         5.00 cm  Diastology LVIDs:         3.80 cm  LV e' medial:    5.48 cm/s LV PW:         1.60 cm  LV E/e' medial:  17.0 LV IVS:        1.70 cm  LV e' lateral:   7.13 cm/s LVOT diam:     2.10 cm  LV E/e' lateral: 13.1 LV SV:         84 LV SV Index:   44 LVOT Area:     3.46 cm  RIGHT VENTRICLE             IVC RV Basal diam:  3.50 cm     IVC diam: 1.80 cm RV S prime:     14.33 cm/s TAPSE (M-mode): 2.5 cm LEFT ATRIUM              Index       RIGHT ATRIUM           Index LA diam:        4.10 cm 2.14 cm/m  RA Area:     21.20 cm LA Vol (A2C):   78.6 ml 40.96 ml/m RA Volume:   57.60 ml  30.02 ml/m  LA Vol (A4C):   47.7 ml 24.86 ml/m LA Biplane Vol: 62.7 ml 32.67 ml/m  AORTIC VALVE AV Area (Vmax):    2.85 cm AV Area (Vmean):   2.85 cm AV Area (VTI):     2.79 cm AV Vmax:           133.50 cm/s AV Vmean:          85.250 cm/s AV VTI:            0.302 m AV Peak Grad:      7.1 mmHg AV Mean Grad:      3.0 mmHg LVOT Vmax:         110.00 cm/s LVOT Vmean:        70.100 cm/s LVOT VTI:          0.243 m LVOT/AV VTI ratio: 0.81 AI PHT:            708 msec  AORTA Ao Root diam: 3.80 cm MITRAL VALVE               TRICUSPID VALVE MV Area (PHT): 2.42 cm    TR Peak grad:   23.2 mmHg MV Peak grad:  3.9 mmHg    TR Vmax:        241.00 cm/s MV Mean grad:  1.0 mmHg MV Vmax:       0.99 m/s    SHUNTS MV Vmean:      52.8 cm/s   Systemic VTI:  0.24 m MV Decel Time: 313 msec    Systemic Diam: 2.10 cm MV E velocity: 93.40 cm/s MV A velocity: 74.10 cm/s MV E/A ratio:  1.26 Mihai Croitoru MD Electronically signed by Sanda Klein MD Signature Date/Time: 05/19/2020/12:51:17 PM    Final      Time coordinating discharge: Over 30 minutes    Dwyane Dee, MD  [facility] 05/21/2020, 2:53 PM

## 2020-05-21 NOTE — Progress Notes (Signed)
Pt's HR is staying in the low 50's with sinus brady, cardiologist on call notified and ordered to hold Metoprolol for tonight, will continue to monitor, Thanks Arvella Nigh RN.

## 2020-05-21 NOTE — Progress Notes (Signed)
Patient alert and oriented x 4, resps even and unlabored. Lung sounds clear throughout but diminished on the right side. PIV is flushing well with no evidence of erythema, edema or warmth. Bowel sounds normoactive all quads; patient having small BMs multiple times a day, denies need for laxative. Patient is independent with ADLs and will frequently disconnect himself from the progressive monitor to walk to the toilet. Explained to him the importance of remaining connected and to call staff when he wants to use toilet, but I continue to walk into the room to see his monitor next to an empty bed. HR has been between 48-51 at rest. Cardiologist came to see patient and walked with [place] down the hallway to make sure it was reactive to exercise; HR elevated to 77. MD instructed to space out pacing meds such as metoprolol and pacerone, but that it was ok to administer. Metoprolol 25mg  given and will hold pacerone until lunch time. Patient continues to deny cardiac workup her in the hospital, preferring to follow up with the [place]. He states that this is where he gets all his care and he is familiar with the physicians and they are familiar with him. There have been no further episodes of Afib and he is pleased about that. Patient is eager and anxious to discharge home. Call light in reach, able to make needs known, will monitor.

## 2020-05-21 NOTE — Discharge Instructions (Signed)

## 2020-05-21 NOTE — Consult Note (Signed)
[facility]   Subjective:  Feeling better. Low heart rate at rest and improves with activity. VS stable otherwise. Monitor: sinus rhythm. Decreased dose of metoprolol and diltiazem for lower heart rate.  Objective:  Vital Signs in the last 24 hours: Temp:  [97.3 F (36.3 C)-98.1 F (36.7 C)] 97.3 F (36.3 C) (11/29 1128) Pulse Rate:  [51-77] 57 (11/29 1128) Cardiac Rhythm: Sinus bradycardia (11/29 0700) Resp:  [18-20] 20 (11/29 1128) BP: (119-135)/(47-76) 120/74 (11/29 1128) SpO2:  [91 %-97 %] 95 % (11/29 1128) Weight:  [75.4 kg] 75.4 kg (11/29 0437)  Physical Exam: BP Readings from Last 1 Encounters:  05/21/20 120/74     Wt Readings from Last 1 Encounters:  05/21/20 75.4 kg    Weight change: 1.043 kg Body mass index is 24.54 kg/m. HEENT: [place]/AT, Eyes-Blue, Conjunctiva-Pink, Sclera-Non-icteric Neck: No JVD, No bruit, Trachea midline. Lungs:  Clear, Bilateral. Cardiac:  Regular rhythm, normal S1 and S2, no S3. II/VI systolic murmur. Abdomen:  Soft, non-tender. BS present. Extremities:  No edema present. No cyanosis. No clubbing. CNS: AxOx3, Cranial nerves grossly intact, moves all 4 extremities.  Skin: Warm and dry.   Intake/Output from previous day: 11/28 0701 - 11/29 0700 In: 440 [P.O.:440] Out: 1226 [Urine:1225; Stool:1]    Lab Results: BMET    Component Value Date/Time   NA 135 05/21/2020 0205   NA 140 05/20/2020 0349   NA 141 05/19/2020 0427   K 3.9 05/21/2020 0205   K 4.1 05/20/2020 0349   K 4.0 05/19/2020 0427   CL 101 05/21/2020 0205   CL 101 05/20/2020 0349   CL 103 05/19/2020 0427   CO2 24 05/21/2020 0205   CO2 27 05/20/2020 0349   CO2 26 05/19/2020 0427   GLUCOSE 112 (H) 05/21/2020 0205   GLUCOSE 98 05/20/2020 0349   GLUCOSE 88 05/19/2020 0427   BUN 31 (H) 05/21/2020 0205   BUN 24 (H) 05/20/2020 0349   BUN 16 05/19/2020 0427   CREATININE 1.84 (H) 05/21/2020 0205   CREATININE 1.54 (H) 05/20/2020 0349   CREATININE 1.41 (H)  05/19/2020 0427   CALCIUM 8.6 (L) 05/21/2020 0205   CALCIUM 9.0 05/20/2020 0349   CALCIUM 8.9 05/19/2020 0427   GFRNONAA 37 (L) 05/21/2020 0205   GFRNONAA 45 (L) 05/20/2020 0349   GFRNONAA 50 (L) 05/19/2020 0427   GFRAA 52 (L) 01/19/2020 0547   GFRAA 43 (L) 01/18/2020 0827   GFRAA >60 09/02/2019 0655   CBC    Component Value Date/Time   WBC 5.0 05/21/2020 0205   RBC 3.80 (L) 05/21/2020 0205   HGB 11.4 (L) 05/21/2020 0205   HCT 34.6 (L) 05/21/2020 0205   PLT 176 05/21/2020 0205   MCV 91.1 05/21/2020 0205   MCH 30.0 05/21/2020 0205   MCHC 32.9 05/21/2020 0205   RDW 13.0 05/21/2020 0205   LYMPHSABS 1.6 05/21/2020 0205   MONOABS 1.1 (H) 05/21/2020 0205   EOSABS 0.2 05/21/2020 0205   BASOSABS 0.0 05/21/2020 0205   HEPATIC Function Panel Recent Labs    08/30/19 2102 05/18/20 1642  PROT 6.4* 6.3*   HEMOGLOBIN A1C No components found for: HGA1C,  MPG CARDIAC ENZYMES Lab Results  Component Value Date   TROPONINI 0.83 (HH) 07/03/2014   TROPONINI 1.38 (HH) 07/03/2014   TROPONINI 1.25 (HH) 07/02/2014   BNP No results for input(s): PROBNP in the last 8760 hours. TSH Recent Labs    01/21/20 0207  TSH 6.863*   CHOLESTEROL Recent Labs  05/19/20 0427  CHOL 125    Scheduled Meds: . amiodarone  200 mg Oral Daily  . apixaban  5 mg Oral BID  . clopidogrel  75 mg Oral QHS  . diltiazem  60 mg Oral Q12H  . ezetimibe  10 mg Oral QPM  . finasteride  5 mg Oral Daily  . hydrochlorothiazide  12.5 mg Oral Daily  . insulin aspart  0-15 Units Subcutaneous TID AC & HS  . ketorolac  1 drop Right Eye Daily  . memantine  10 mg Oral q AM  . metoprolol tartrate  25 mg Oral BID  . pantoprazole  40 mg Oral Daily  . ramipril  10 mg Oral BID  . rivastigmine  6 mg Oral BID   Continuous Infusions: PRN Meds:.acetaminophen, nitroGLYCERIN, ondansetron (ZOFRAN) IV  Assessment/Plan: Chest pain Paroxysmal atrial fibrillation CAD PAD Type 2 DM HTN HLD COPD GERD  Continue lower  dose of metoprolol and diltiazem for HR control and BP control. Increase activity. F/U in clinic as needed.   LOS: 2 days   Time spent including chart review, lab review, examination, discussion with patient :  min   Dixie Dials  MD  05/21/2020, 1:17 PM

## 2020-05-21 NOTE — Plan of Care (Signed)
  Problem: Nutrition: Goal: Adequate nutrition will be maintained Outcome: Completed/Met   Problem: Coping: Goal: Level of anxiety will decrease Outcome: Completed/Met   Problem: Elimination: Goal: Will not experience complications related to bowel motility Outcome: Completed/Met Goal: Will not experience complications related to urinary retention Outcome: Completed/Met   Problem: Pain Managment: Goal: General experience of comfort will improve Outcome: Completed/Met   Problem: Skin Integrity: Goal: Risk for impaired skin integrity will decrease Outcome: Completed/Met   Problem: Nutrition: Goal: Adequate nutrition will be maintained Outcome: Completed/Met   Problem: Coping: Goal: Level of anxiety will decrease Outcome: Completed/Met   Problem: Elimination: Goal: Will not experience complications related to bowel motility Outcome: Completed/Met Goal: Will not experience complications related to urinary retention Outcome: Completed/Met   Problem: Pain Managment: Goal: General experience of comfort will improve Outcome: Completed/Met   Problem: Skin Integrity: Goal: Risk for impaired skin integrity will decrease Outcome: Completed/Met

## 2020-05-21 NOTE — Plan of Care (Signed)
All care plan goals have been met and/or are adequate for discharge

## 2020-05-21 NOTE — TOC Initial Note (Signed)
Transition of Care [facility]) - Initial/Assessment Note    Patient Details  Name: Donald Gordon MRN: [id number] Date of Birth: September 19, 1940  Transition [facility]) CM/SW Contact:    Zenon Mayo, RN Phone Number: 05/21/2020, 3:14 PM  Clinical Narrative:                 Patient is for dc today, he has transportation at dc. He has no needs.  Expected Discharge Plan: Home/Self Care Barriers to Discharge: No Barriers Identified   Patient Goals and CMS Choice Patient states their goals for this hospitalization and ongoing recovery are:: to go home   Choice offered to / list presented to : NA  Expected Discharge Plan and Services Expected Discharge Plan: Home/Self Care   Discharge Planning Services: CM Consult Post Acute Care Choice: NA Living arrangements for the past 2 months: Single Family Home Expected Discharge Date: 05/21/20                 DME Agency: NA       HH Arranged: NA          Prior Living Arrangements/Services Living arrangements for the past 2 months: Single Family Home Lives with:: Self Patient language and need for interpreter reviewed:: Yes Do you feel safe going back to the place where you live?: Yes      Need for Family Participation in Patient Care: No (Comment) Care giver support system in place?: No (comment)   Criminal Activity/Legal Involvement Pertinent to Current Situation/Hospitalization: No - Comment as needed  Activities of Daily Living Home Assistive Devices/Equipment: Walker (specify type) ADL Screening (condition at time of admission) Patient's cognitive ability adequate to safely complete daily activities?: Yes Is the patient deaf or have difficulty hearing?: No Does the patient have difficulty seeing, even when wearing glasses/contacts?: No Does the patient have difficulty concentrating, remembering, or making decisions?: No Patient able to express need for assistance with ADLs?: Yes Does the patient have difficulty  dressing or bathing?: No Independently performs ADLs?: Yes (appropriate for developmental age) Does the patient have difficulty walking or climbing stairs?: No Weakness of Legs: None Weakness of Arms/Hands: None  Permission Sought/Granted                  Emotional Assessment   Attitude/Demeanor/Rapport: Engaged Affect (typically observed): Appropriate Orientation: : Oriented to Self, Oriented to Place, Oriented to  Time, Oriented to Situation Alcohol / Substance Use: Not Applicable Psych Involvement: No (comment)  Admission diagnosis:  Chest pain [R07.9] Patient Active Problem List   Diagnosis Date Noted  . Chest pain 05/18/2020  . Mixed diabetic hyperlipidemia associated with type 2 diabetes mellitus ([place]) 05/18/2020  . Acute renal failure superimposed on stage 3a chronic kidney disease ([place]) 05/18/2020  . 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 esophagitis 05/18/2020  . Chronic diastolic (congestive) heart failure ([place]) 05/18/2020  . Critical limb ischemia with history of revascularization of same extremity ([place]) 01/18/2020  . Critical lower limb ischemia ([place]) 08/31/2019  . PAD (peripheral artery disease) ([place]) 07/23/2019  . Ischemia of right lower extremity 01/09/2018  . Critical ischemia of foot ([place]) 01/09/2018  . Atrial fibrillation ([place]) 07/04/2014  . Atrial fibrillation with rapid ventricular response ([place])   . AF (paroxysmal atrial fibrillation) ([place]) 07/02/2014  . Essential hypertension 07/02/2014  . Coronary artery disease involving native coronary artery of native heart 07/02/2014  . Cough 07/02/2014  . Atrial fibrillation with RVR ([place]) 07/02/2014  . Type 2 diabetes mellitus with stage 3a chronic kidney  disease, without long-term current use of insulin ([place]) 07/02/2014   PCP:  [place]:   [address], [address] [place] [address] Phone: [phone] Fax: [phone]  [facility] [place] [place] - [address] [place] [address] Phone: [phone] Fax: [phone]     Social Determinants of Health (SDOH) Interventions    Readmission Risk Interventions Readmission Risk Prevention Plan 05/21/2020 01/21/2020 07/26/2019  Post Dischage Appt - - Complete  Medication Screening - - Complete  Transportation Screening Complete Complete Complete  PCP or Specialist Appt within 3-5 Days Complete - -  HRI or Home Care Consult Complete Complete -  Social Work Consult for [place] Planning/Counseling Complete Complete -  Palliative Care Screening Not Applicable Not Applicable -  Medication Review Press photographer) Complete Complete -  Some recent data might be hidden

## 2020-05-29 ENCOUNTER — Other Ambulatory Visit: Payer: Self-pay

## 2020-05-29 ENCOUNTER — Ambulatory Visit (INDEPENDENT_AMBULATORY_CARE_PROVIDER_SITE_OTHER)
Admission: RE | Admit: 2020-05-29 | Discharge: 2020-05-29 | Disposition: A | Discharge status: Home / Self Care | Payer: Medicare Other | Source: Ambulatory Visit | Attending: Vascular Surgery | Admitting: Vascular Surgery

## 2020-05-29 ENCOUNTER — Ambulatory Visit (INDEPENDENT_AMBULATORY_CARE_PROVIDER_SITE_OTHER): Payer: No Typology Code available for payment source | Admitting: Vascular Surgery

## 2020-05-29 ENCOUNTER — Encounter: Payer: Self-pay | Admitting: Vascular Surgery

## 2020-05-29 ENCOUNTER — Ambulatory Visit (HOSPITAL_COMMUNITY)
Admission: RE | Admit: 2020-05-29 | Discharge: 2020-05-29 | Disposition: A | Discharge status: Home / Self Care | Payer: Medicare Other | Source: Ambulatory Visit | Attending: Vascular Surgery | Admitting: Vascular Surgery

## 2020-05-29 VITALS — BP 162/71 | HR 50 | Temp 97.6°F | Resp 16 | Ht 69.0 in | Wt 168.0 lb

## 2020-05-29 DIAGNOSIS — I739 Peripheral vascular disease, unspecified: Secondary | ICD-10-CM

## 2020-05-29 DIAGNOSIS — I70229 Atherosclerosis of native arteries of extremities with rest pain, unspecified extremity: Secondary | ICD-10-CM

## 2020-05-29 NOTE — Progress Notes (Signed)
Patient name: Donald Gordon MRN: [id number] DOB: 05/19/1941 Sex: male  REASON FOR VISIT: 3 month follow-up surveillance of right lower extremity acute on chronic limb ischemia  HPI: Donald Gordon is a 79 y.o. male who presents for 3 month follow-up of right lower extremity given multiple complex interventions.  He has a very complex history and records show in 2019 he had acute limb ischemia of the right leg that required thrombectomy of his tibials and a right common femoral to below-knee popliteal bypass by Dr. Bridgett Larsson with propaten.  Ultimately his bypass later occluded in February 2021 and he had stent of his right TP trunk up through the bypass with Viabahn's by Dr. [name].  He then reoccluded this and in March 2021 had thrombolysis of the stented bypass and this required proximal stent of his bypass with Eluvia as well as the distal stent into the TP trunk with a Tigris again by Dr. [name].  Apparently had some interim work done on the right leg at the [place] as well.  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  He has remained on Plavix and Eliquis.  During his most recent event had come off his anticoagulation for colonoscopy when he presented in July with acute on chronic limb ischcemia.    Today's visit he has noticed a bit more numbness in the right leg particularly when walking.  Doesn't seem to bother him at night.   Past Medical History:  Diagnosis Date  . Arthritis    "knees, elbows" (03/18/2018)  . Chronic edema    a. Chronic RLE edema.  Marland Kitchen COPD (chronic obstructive pulmonary disease) ([place])   . Coronary artery disease    a. MI s/p balloon 1996, details unclear.  Marland Kitchen Dysrhythmia    PROXIMAL MARGIN FIBRILATION  . GERD (gastroesophageal reflux disease)   . Hyperlipidemia   . Hypertension   . Myocardial infarction ([place]) 1996  . PAD  (peripheral artery disease) ([place])    a. s/p stenting 08/2012, 02/2013.   Marland Kitchen PAF (paroxysmal atrial fibrillation) ([place])   . Sleep apnea    "have mask; have to start wearing it" (03/18/2018)  . Stroke [facility]) ~ 2014   denies residual on 03/18/2018  . Thrombosis of lower extremity    a. Listed on patient's medical bracelet - at [place].  Marland Kitchen Type II diabetes mellitus ([place])     Past Surgical History:  Procedure Laterality Date  . ABDOMINAL AORTOGRAM W/LOWER EXTREMITY N/A 07/25/2019   Procedure: ABDOMINAL AORTOGRAM W/LOWER EXTREMITY;  Surgeon: Waynetta Sandy, MD;  Location: [facility];  Service: Cardiovascular;  Laterality: N/A;  . ANTERIOR LUMBAR FUSION  1981  . APPENDECTOMY    . APPLICATION OF WOUND VAC Right 01/09/2018   Procedure: APPLICATION OF WOUND VAC ON RIGHT LOWER LEG;  Surgeon: Conrad Salem, MD;  Location: [facility];  Service: Vascular;  Laterality: Right;  . BACK SURGERY    . CATARACT EXTRACTION W/ INTRAOCULAR LENS  IMPLANT, BILATERAL Bilateral   . CORONARY ANGIOPLASTY WITH STENT PLACEMENT     "I've got 1 stent in there" (03/18/2018)  . FASCIOTOMY Right 01/09/2018   Procedure: FASCIOTOMY RIGHT LOWER LEG;  Surgeon: Conrad McFarland, MD;  Location: [facility];  Service: Vascular;  Laterality: Right;  . FASCIOTOMY CLOSURE Right 01/11/2018   Procedure: FASCIOTOMY CLOSURE RIGHT CALF;  Surgeon: Bridgett Larsson,  Jannette Fogo, MD;  Location: [facility];  Service: Vascular;  Laterality: Right;  . FEMORAL-POPLITEAL BYPASS GRAFT Right 01/09/2018   Procedure: RIGHT FEMORAL-POPLITEAL ARTERY BYPASS USING PROPATEN 6MM X 80CM VASCULAR GRAFT;  Surgeon: Conrad Woodville, MD;  Location: [facility];  Service: Vascular;  Laterality: Right;  . HEMORRHOID SURGERY    . LEFT HEART CATH AND CORONARY ANGIOGRAPHY N/A 03/19/2018   Procedure: LEFT HEART CATH AND CORONARY ANGIOGRAPHY;  Surgeon: Martinique, Peter M, MD;  Location: [facility];  Service: Cardiovascular;  Laterality: N/A;  . LOWER EXTREMITY ANGIOGRAPHY N/A 08/31/2019   Procedure:  LOWER EXTREMITY ANGIOGRAPHY;  Surgeon: [name], MD;  Location: [facility];  Service: Cardiovascular;  Laterality: N/A;  . LOWER EXTREMITY ANGIOGRAPHY Right 01/18/2020   Procedure: LOWER EXTREMITY ANGIOGRAPHY;  Surgeon: [name], MD;  Location: [facility];  Service: Cardiovascular;  Laterality: Right;  . PATCH ANGIOPLASTY Right 01/09/2018   Procedure: PATCH ANGIOPLASTY USING Rueben Bash BIOLOGIC 1CM X 6CM PATCH;  Surgeon: Conrad Juncal, MD;  Location: [facility];  Service: Vascular;  Laterality: Right;  . PERIPHERAL VASCULAR ATHERECTOMY Right 01/19/2020   Procedure: PERIPHERAL VASCULAR ATHERECTOMY;  Surgeon: [name], MD;  Location: [facility];  Service: Cardiovascular;  Laterality: Right;  Femoral popliteal and tibioperoneal arteries.  Marland Kitchen PERIPHERAL VASCULAR BALLOON ANGIOPLASTY Right 09/01/2019   Procedure: PERIPHERAL VASCULAR BALLOON ANGIOPLASTY;  Surgeon: Waynetta Sandy, MD;  Location: [facility];  Service: Cardiovascular;  Laterality: Right;  peroneal artery.  Marland Kitchen PERIPHERAL VASCULAR INTERVENTION Right 07/25/2019   Procedure: PERIPHERAL VASCULAR INTERVENTION;  Surgeon: Waynetta Sandy, MD;  Location: [facility];  Service: Cardiovascular;  Laterality: Right;  SFA X 3  . PERIPHERAL VASCULAR INTERVENTION Right 09/01/2019   Procedure: PERIPHERAL VASCULAR INTERVENTION;  Surgeon: Waynetta Sandy, MD;  Location: [facility];  Service: Cardiovascular;  Laterality: Right;  superficial femoral  . PERIPHERAL VASCULAR THROMBECTOMY Right 09/01/2019   Procedure: PERIPHERAL VASCULAR THROMBECTOMY;  Surgeon: Waynetta Sandy, MD;  Location: [facility];  Service: Cardiovascular;  Laterality: Right;  . PERIPHERAL VASCULAR THROMBECTOMY Right 01/18/2020   Procedure: PERIPHERAL VASCULAR THROMBECTOMY;  Surgeon: [name], MD;  Location: [facility];  Service: Cardiovascular;  Laterality:  Right;  . PERIPHERAL VASCULAR THROMBECTOMY N/A 01/19/2020   Procedure: LYSIS RECHECK;  Surgeon: [name], MD;  Location: [facility];  Service: Cardiovascular;  Laterality: N/A;  . POSTERIOR LUMBAR FUSION  1978  . THROMBECTOMY FEMORAL ARTERY Right 01/09/2018   Procedure: THROMBECTOMY RIGHT LOWER LEG;  Surgeon: Conrad Lawson, MD;  Location: [facility];  Service: Vascular;  Laterality: Right;  . TONSILLECTOMY      Family History  Problem Relation Age of Onset  . CAD Mother   . CAD Brother   . Hypertension Other     SOCIAL HISTORY: Social History   Tobacco Use  . Smoking status: Former Smoker    Packs/day: 3.00    Years: 30.00    Pack years: 90.00    Quit date: 1996    Years since quitting: 25.9  . Smokeless tobacco: Never Used  Substance Use Topics  . Alcohol use: No    Comment: Remote alcohol use    Allergies  Allergen Reactions  . Oxycodone Nausea Only  . Statins Other (See Comments)    Muscle and Bone pain Muscle and Bone pain  . Donepezil Other (See Comments)    [facility]  . Imipramine Other (See Comments)    [facility]  . Oxybutynin Chloride Swelling and Other (See Comments)    Per [facility]    Current Outpatient Medications  Medication Sig Dispense Refill  . acetaminophen (TYLENOL) 500 MG tablet Take 500 mg by mouth 2 (two) times daily as needed (for pain or headaches).     Marland Kitchen amiodarone (PACERONE) 200 MG tablet Take 1 tablet (200 mg total) by mouth daily. 30 tablet 3  . apixaban (ELIQUIS) 2.5 MG TABS tablet Take 1 tablet (2.5 mg total) by mouth 2 (two) times daily. 30 tablet 3  . Cholecalciferol (VITAMIN D3 PO) Take 1 tablet by mouth daily.    . clopidogrel (PLAVIX) 75 MG tablet Take 1 tablet (75 mg total) by mouth daily. (Patient taking differently: Take 75 mg by mouth at bedtime. ) 90 tablet 1  . Cyanocobalamin (VITAMIN B-12 PO) Take 1 tablet by mouth daily.    Marland Kitchen diltiazem (CARDIZEM) 60 MG tablet Take 1 tablet (60 mg total) by mouth every 12 (twelve) hours.  60 tablet 3  . ezetimibe (ZETIA) 10 MG tablet Take 10 mg by mouth every evening.     . Ferrous Sulfate (IRON PO) Take 1 tablet by mouth daily.    . finasteride (PROSCAR) 5 MG tablet Take 5 mg by mouth daily.    . hydrochlorothiazide (MICROZIDE) 12.5 MG capsule Take 12.5 mg by mouth daily.    . memantine (NAMENDA) 10 MG tablet Take 10 mg by mouth in the morning.     . metoprolol tartrate (LOPRESSOR) 25 MG tablet Take 1 tablet (25 mg total) by mouth 2 (two) times daily. 60 tablet 3  . nitroGLYCERIN (NITROSTAT) 0.4 MG SL tablet Place 0.4 mg under the tongue every 5 (five) minutes as needed for chest pain.     . pantoprazole (PROTONIX) 40 MG tablet Take 40 mg by mouth daily.    . ramipril (ALTACE) 10 MG capsule Take 10 mg by mouth 2 (two) times daily.    . rivastigmine (EXELON) 6 MG capsule Take 6 mg by mouth 2 (two) times daily.    Marland Kitchen ketorolac (ACULAR) 0.5 % ophthalmic solution Place 1 drop into the right eye daily.  (Patient not taking: Reported on 05/29/2020)     No current facility-administered medications for this visit.    REVIEW OF SYSTEMS:  [X]  denotes positive finding, [ ]  denotes negative finding Cardiac  Comments:  Chest pain or chest pressure:    Shortness of breath upon exertion:    Short of breath when lying flat:    Irregular heart rhythm:        Vascular    Pain in calf, thigh, or hip brought on by ambulation:    Pain in feet at night that wakes you up from your sleep:     Blood clot in your veins:    Leg swelling:         Pulmonary    Oxygen at home:    Productive cough:     Wheezing:         Neurologic    Sudden weakness in arms or legs:     Sudden numbness in arms or legs:     Sudden onset of difficulty speaking or slurred speech:    Temporary loss of vision in one eye:     Problems with dizziness:         Gastrointestinal    Blood in stool:     Vomited blood:  Genitourinary    Burning when urinating:     Blood in urine:        Psychiatric     Major depression:         Hematologic    Bleeding problems:    Problems with blood clotting too easily:        Skin    Rashes or ulcers:        Constitutional    Fever or chills:      PHYSICAL EXAM: Vitals:   05/29/20 1130  BP: (!) 162/71  Pulse: (!) 50  Resp: 16  Temp: 97.6 F (36.4 C)  TempSrc: Temporal  SpO2: 94%  Weight: 168 lb (76.2 kg)  Height: 5\' 9"  (1.753 m)    GENERAL: The patient is a well-nourished male, in no acute distress. The vital signs are documented above. CARDIAC: There is a regular rate and rhythm.  VASCULAR:  Femoral pulses palpable bilaterally No palpable right pedal pulses, but foot warm with no tissue loss PULMONARY: No respiratory distress. ABDOMEN: Soft and non-tender.  DATA:   Right lower extremity arterial duplex today shows a moderate 50 to 74% stenosis in the distal graft.  ABI's on the right have dropped from 1.02 to 0.88  Assessment/Plan:  79 year old male with very complex revascularization history in the right lower extremity as noted above in the HPI.  He has presented multiple times in the past year with acute occlusion of right lower extremity.  Most recently underwent thrombolysis and subsequent laser arthrectomy to salvage the bypass in July of this year.    He has developing a moderate stenosis in the distal graft with a drop in his ABIs and he is having some increased symptoms.  I have subsequently recommended right lower extremity arteriogram with possible intervention.  His bypass that has been realigned with stents has become very difficult to salvage when they occluded and I think intervention now would be his best option to prevent another acute on chronic event.  He will need to stop his anticoagulation for 48 hours.  Risks and benefits discussed in detail.   [name], MD [facility] Office: [phone]

## 2020-05-30 ENCOUNTER — Other Ambulatory Visit: Payer: Self-pay

## 2020-06-01 DIAGNOSIS — R0902 Hypoxemia: Secondary | ICD-10-CM | POA: Diagnosis not present

## 2020-06-01 DIAGNOSIS — R202 Paresthesia of skin: Secondary | ICD-10-CM | POA: Diagnosis not present

## 2020-06-01 DIAGNOSIS — R52 Pain, unspecified: Secondary | ICD-10-CM | POA: Diagnosis not present

## 2020-06-01 DIAGNOSIS — I959 Hypotension, unspecified: Secondary | ICD-10-CM | POA: Diagnosis not present

## 2020-06-02 ENCOUNTER — Encounter (HOSPITAL_COMMUNITY)
Admission: EM | Disposition: A | Discharge status: Skilled Nursing Facility | Payer: Self-pay | Source: Home / Self Care | Attending: Vascular Surgery

## 2020-06-02 ENCOUNTER — Emergency Department (HOSPITAL_COMMUNITY): Payer: Medicare Other

## 2020-06-02 ENCOUNTER — Emergency Department (HOSPITAL_COMMUNITY): Payer: Medicare Other | Admitting: Certified Registered Nurse Anesthetist

## 2020-06-02 ENCOUNTER — Inpatient Hospital Stay (HOSPITAL_COMMUNITY)
Admission: EM | Admit: 2020-06-02 | Discharge: 2020-06-12 | DRG: 253 | Disposition: A | Discharge status: Skilled Nursing Facility | Payer: Medicare Other | Attending: Vascular Surgery | Admitting: Vascular Surgery

## 2020-06-02 ENCOUNTER — Other Ambulatory Visit: Payer: Self-pay

## 2020-06-02 ENCOUNTER — Encounter (HOSPITAL_COMMUNITY): Payer: Self-pay | Admitting: Emergency Medicine

## 2020-06-02 DIAGNOSIS — M2550 Pain in unspecified joint: Secondary | ICD-10-CM | POA: Diagnosis not present

## 2020-06-02 DIAGNOSIS — E785 Hyperlipidemia, unspecified: Secondary | ICD-10-CM | POA: Diagnosis present

## 2020-06-02 DIAGNOSIS — N400 Enlarged prostate without lower urinary tract symptoms: Secondary | ICD-10-CM | POA: Diagnosis not present

## 2020-06-02 DIAGNOSIS — T82868D Thrombosis of vascular prosthetic devices, implants and grafts, subsequent encounter: Secondary | ICD-10-CM | POA: Diagnosis not present

## 2020-06-02 DIAGNOSIS — Z981 Arthrodesis status: Secondary | ICD-10-CM

## 2020-06-02 DIAGNOSIS — I248 Other forms of acute ischemic heart disease: Secondary | ICD-10-CM | POA: Diagnosis present

## 2020-06-02 DIAGNOSIS — I10 Essential (primary) hypertension: Secondary | ICD-10-CM | POA: Diagnosis not present

## 2020-06-02 DIAGNOSIS — K219 Gastro-esophageal reflux disease without esophagitis: Secondary | ICD-10-CM | POA: Diagnosis not present

## 2020-06-02 DIAGNOSIS — T79A0XA Compartment syndrome, unspecified, initial encounter: Secondary | ICD-10-CM | POA: Diagnosis not present

## 2020-06-02 DIAGNOSIS — Z955 Presence of coronary angioplasty implant and graft: Secondary | ICD-10-CM

## 2020-06-02 DIAGNOSIS — R2681 Unsteadiness on feet: Secondary | ICD-10-CM | POA: Diagnosis not present

## 2020-06-02 DIAGNOSIS — Z87891 Personal history of nicotine dependence: Secondary | ICD-10-CM | POA: Diagnosis not present

## 2020-06-02 DIAGNOSIS — I2510 Atherosclerotic heart disease of native coronary artery without angina pectoris: Secondary | ICD-10-CM | POA: Diagnosis present

## 2020-06-02 DIAGNOSIS — Z8673 Personal history of transient ischemic attack (TIA), and cerebral infarction without residual deficits: Secondary | ICD-10-CM

## 2020-06-02 DIAGNOSIS — R531 Weakness: Secondary | ICD-10-CM | POA: Diagnosis not present

## 2020-06-02 DIAGNOSIS — M79604 Pain in right leg: Secondary | ICD-10-CM | POA: Diagnosis not present

## 2020-06-02 DIAGNOSIS — E1151 Type 2 diabetes mellitus with diabetic peripheral angiopathy without gangrene: Secondary | ICD-10-CM | POA: Diagnosis not present

## 2020-06-02 DIAGNOSIS — I959 Hypotension, unspecified: Secondary | ICD-10-CM | POA: Diagnosis not present

## 2020-06-02 DIAGNOSIS — M1990 Unspecified osteoarthritis, unspecified site: Secondary | ICD-10-CM | POA: Diagnosis not present

## 2020-06-02 DIAGNOSIS — I252 Old myocardial infarction: Secondary | ICD-10-CM

## 2020-06-02 DIAGNOSIS — J449 Chronic obstructive pulmonary disease, unspecified: Secondary | ICD-10-CM | POA: Diagnosis present

## 2020-06-02 DIAGNOSIS — I5032 Chronic diastolic (congestive) heart failure: Secondary | ICD-10-CM | POA: Diagnosis present

## 2020-06-02 DIAGNOSIS — Z79899 Other long term (current) drug therapy: Secondary | ICD-10-CM | POA: Diagnosis not present

## 2020-06-02 DIAGNOSIS — I4510 Unspecified right bundle-branch block: Secondary | ICD-10-CM | POA: Diagnosis present

## 2020-06-02 DIAGNOSIS — Z86718 Personal history of other venous thrombosis and embolism: Secondary | ICD-10-CM

## 2020-06-02 DIAGNOSIS — I998 Other disorder of circulatory system: Secondary | ICD-10-CM | POA: Diagnosis present

## 2020-06-02 DIAGNOSIS — I480 Paroxysmal atrial fibrillation: Secondary | ICD-10-CM | POA: Diagnosis present

## 2020-06-02 DIAGNOSIS — Y832 Surgical operation with anastomosis, bypass or graft as the cause of abnormal reaction of the patient, or of later complication, without mention of misadventure at the time of the procedure: Secondary | ICD-10-CM | POA: Diagnosis present

## 2020-06-02 DIAGNOSIS — R609 Edema, unspecified: Secondary | ICD-10-CM | POA: Diagnosis not present

## 2020-06-02 DIAGNOSIS — Z20822 Contact with and (suspected) exposure to covid-19: Secondary | ICD-10-CM | POA: Diagnosis present

## 2020-06-02 DIAGNOSIS — F0390 Unspecified dementia without behavioral disturbance: Secondary | ICD-10-CM | POA: Diagnosis not present

## 2020-06-02 DIAGNOSIS — R42 Dizziness and giddiness: Secondary | ICD-10-CM | POA: Diagnosis not present

## 2020-06-02 DIAGNOSIS — Z7901 Long term (current) use of anticoagulants: Secondary | ICD-10-CM | POA: Diagnosis not present

## 2020-06-02 DIAGNOSIS — M79671 Pain in right foot: Secondary | ICD-10-CM | POA: Diagnosis not present

## 2020-06-02 DIAGNOSIS — I110 Hypertensive heart disease with heart failure: Secondary | ICD-10-CM | POA: Diagnosis present

## 2020-06-02 DIAGNOSIS — R278 Other lack of coordination: Secondary | ICD-10-CM | POA: Diagnosis not present

## 2020-06-02 DIAGNOSIS — I739 Peripheral vascular disease, unspecified: Secondary | ICD-10-CM | POA: Diagnosis not present

## 2020-06-02 DIAGNOSIS — M6281 Muscle weakness (generalized): Secondary | ICD-10-CM | POA: Diagnosis not present

## 2020-06-02 DIAGNOSIS — Z7902 Long term (current) use of antithrombotics/antiplatelets: Secondary | ICD-10-CM

## 2020-06-02 DIAGNOSIS — Z7401 Bed confinement status: Secondary | ICD-10-CM | POA: Diagnosis not present

## 2020-06-02 DIAGNOSIS — R079 Chest pain, unspecified: Secondary | ICD-10-CM | POA: Diagnosis not present

## 2020-06-02 DIAGNOSIS — T82868A Thrombosis of vascular prosthetic devices, implants and grafts, initial encounter: Principal | ICD-10-CM | POA: Diagnosis present

## 2020-06-02 DIAGNOSIS — R41841 Cognitive communication deficit: Secondary | ICD-10-CM | POA: Diagnosis not present

## 2020-06-02 HISTORY — PX: LOWER EXTREMITY ANGIOGRAM: SHX5508

## 2020-06-02 LAB — CBC WITH DIFFERENTIAL/PLATELET
Abs Immature Granulocytes: 0.01 10*3/uL (ref 0.00–0.07)
Basophils Absolute: 0 10*3/uL (ref 0.0–0.1)
Basophils Relative: 1 %
Eosinophils Absolute: 0.1 10*3/uL (ref 0.0–0.5)
Eosinophils Relative: 2 %
HCT: 38.4 % — ABNORMAL LOW (ref 39.0–52.0)
Hemoglobin: 12.2 g/dL — ABNORMAL LOW (ref 13.0–17.0)
Immature Granulocytes: 0 %
Lymphocytes Relative: 26 %
Lymphs Abs: 1.1 10*3/uL (ref 0.7–4.0)
MCH: 29.6 pg (ref 26.0–34.0)
MCHC: 31.8 g/dL (ref 30.0–36.0)
MCV: 93.2 fL (ref 80.0–100.0)
Monocytes Absolute: 0.9 10*3/uL (ref 0.1–1.0)
Monocytes Relative: 21 %
Neutro Abs: 2.1 10*3/uL (ref 1.7–7.7)
Neutrophils Relative %: 50 %
Platelets: 218 10*3/uL (ref 150–400)
RBC: 4.12 MIL/uL — ABNORMAL LOW (ref 4.22–5.81)
RDW: 13.4 % (ref 11.5–15.5)
WBC: 4.1 10*3/uL (ref 4.0–10.5)
nRBC: 0 % (ref 0.0–0.2)

## 2020-06-02 LAB — BASIC METABOLIC PANEL
Anion gap: 11 (ref 5–15)
BUN: 18 mg/dL (ref 8–23)
CO2: 23 mmol/L (ref 22–32)
Calcium: 9.1 mg/dL (ref 8.9–10.3)
Chloride: 106 mmol/L (ref 98–111)
Creatinine, Ser: 1.29 mg/dL — ABNORMAL HIGH (ref 0.61–1.24)
GFR, Estimated: 56 mL/min — ABNORMAL LOW (ref >60)
Glucose, Bld: 129 mg/dL — ABNORMAL HIGH (ref 70–99)
Potassium: 4.3 mmol/L (ref 3.5–5.1)
Sodium: 140 mmol/L (ref 135–145)

## 2020-06-02 LAB — I-STAT CHEM 8, ED
BUN: 27 mg/dL — ABNORMAL HIGH (ref 8–23)
Calcium, Ion: 1.09 mmol/L — ABNORMAL LOW (ref 1.15–1.40)
Chloride: 106 mmol/L (ref 98–111)
Creatinine, Ser: 1.2 mg/dL (ref 0.61–1.24)
Glucose, Bld: 105 mg/dL — ABNORMAL HIGH (ref 70–99)
HCT: 35 % — ABNORMAL LOW (ref 39.0–52.0)
Hemoglobin: 11.9 g/dL — ABNORMAL LOW (ref 13.0–17.0)
Potassium: 6.4 mmol/L (ref 3.5–5.1)
Sodium: 138 mmol/L (ref 135–145)
TCO2: 26 mmol/L (ref 22–32)

## 2020-06-02 LAB — CBC
HCT: 31.1 % — ABNORMAL LOW (ref 39.0–52.0)
Hemoglobin: 10.4 g/dL — ABNORMAL LOW (ref 13.0–17.0)
MCH: 30.8 pg (ref 26.0–34.0)
MCHC: 33.4 g/dL (ref 30.0–36.0)
MCV: 92 fL (ref 80.0–100.0)
Platelets: 142 10*3/uL — ABNORMAL LOW (ref 150–400)
RBC: 3.38 MIL/uL — ABNORMAL LOW (ref 4.22–5.81)
RDW: 13.6 % (ref 11.5–15.5)
WBC: 3.5 10*3/uL — ABNORMAL LOW (ref 4.0–10.5)
nRBC: 0 % (ref 0.0–0.2)

## 2020-06-02 LAB — RESP PANEL BY RT-PCR (FLU A&B, COVID) ARPGX2
Influenza A by PCR: NEGATIVE
Influenza B by PCR: NEGATIVE
SARS Coronavirus 2 by RT PCR: NEGATIVE

## 2020-06-02 LAB — POCT I-STAT, CHEM 8
BUN: 18 mg/dL (ref 8–23)
Calcium, Ion: 1.25 mmol/L (ref 1.15–1.40)
Chloride: 105 mmol/L (ref 98–111)
Creatinine, Ser: 1.1 mg/dL (ref 0.61–1.24)
Glucose, Bld: 94 mg/dL (ref 70–99)
HCT: 34 % — ABNORMAL LOW (ref 39.0–52.0)
Hemoglobin: 11.6 g/dL — ABNORMAL LOW (ref 13.0–17.0)
Potassium: 4.3 mmol/L (ref 3.5–5.1)
Sodium: 141 mmol/L (ref 135–145)
TCO2: 22 mmol/L (ref 22–32)

## 2020-06-02 LAB — GLUCOSE, CAPILLARY
Glucose-Capillary: 108 mg/dL — ABNORMAL HIGH (ref 70–99)
Glucose-Capillary: 146 mg/dL — ABNORMAL HIGH (ref 70–99)

## 2020-06-02 LAB — CBG MONITORING, ED: Glucose-Capillary: 100 mg/dL — ABNORMAL HIGH (ref 70–99)

## 2020-06-02 LAB — FIBRINOGEN: Fibrinogen: 237 mg/dL (ref 210–475)

## 2020-06-02 LAB — HEPARIN LEVEL (UNFRACTIONATED): Heparin Unfractionated: 0.95 IU/mL — ABNORMAL HIGH (ref 0.30–0.70)

## 2020-06-02 LAB — APTT: aPTT: 71 seconds — ABNORMAL HIGH (ref 24–36)

## 2020-06-02 SURGERY — ANGIOGRAM, LOWER EXTREMITY
Anesthesia: General | Site: Groin | Laterality: Right

## 2020-06-02 MED ORDER — MORPHINE SULFATE (PF) 4 MG/ML IV SOLN
5.0000 mg | INTRAVENOUS | Status: DC | PRN
  Administered 2020-06-02 – 2020-06-04 (×5): 5 mg via INTRAVENOUS
  Filled 2020-06-02 (×5): qty 2

## 2020-06-02 MED ORDER — PHENYLEPHRINE HCL-NACL 10-0.9 MG/250ML-% IV SOLN
INTRAVENOUS | Status: DC | PRN
  Administered 2020-06-02: 25 ug/min via INTRAVENOUS

## 2020-06-02 MED ORDER — ONDANSETRON HCL 4 MG/2ML IJ SOLN
INTRAMUSCULAR | Status: DC | PRN
  Administered 2020-06-02: 4 mg via INTRAVENOUS

## 2020-06-02 MED ORDER — SODIUM CHLORIDE 0.9 % IV SOLN
INTRAVENOUS | Status: DC | PRN
  Administered 2020-06-02: 13:00:00 500 mL

## 2020-06-02 MED ORDER — ACETAMINOPHEN 10 MG/ML IV SOLN: 1000.0000 mg | Freq: Once | INTRAVENOUS | Status: DC | PRN

## 2020-06-02 MED ORDER — SODIUM CHLORIDE 0.9 % IV SOLN: INTRAVENOUS | Status: DC

## 2020-06-02 MED ORDER — MIDAZOLAM HCL 2 MG/2ML IJ SOLN: 1.0000 mg | INTRAMUSCULAR | Status: DC | PRN

## 2020-06-02 MED ORDER — HEPARIN (PORCINE) 25000 UT/250ML-% IV SOLN
800.0000 [IU]/h | INTRAVENOUS | Status: DC
  Administered 2020-06-02: 15:00:00 800 [IU]/h via INTRAVENOUS
  Filled 2020-06-02: qty 250

## 2020-06-02 MED ORDER — GLYCOPYRROLATE PF 0.2 MG/ML IJ SOSY
PREFILLED_SYRINGE | INTRAMUSCULAR | Status: DC | PRN
  Administered 2020-06-02: 2 mg via INTRAVENOUS

## 2020-06-02 MED ORDER — CEFAZOLIN SODIUM-DEXTROSE 2-4 GM/100ML-% IV SOLN
2.0000 g | Freq: Once | INTRAVENOUS | Status: AC
  Administered 2020-06-02: 12:00:00 2 g via INTRAVENOUS

## 2020-06-02 MED ORDER — PROPOFOL 10 MG/ML IV BOLUS
INTRAVENOUS | Status: DC | PRN
  Administered 2020-06-02: 180 mg via INTRAVENOUS
  Administered 2020-06-02: 20 mg via INTRAVENOUS

## 2020-06-02 MED ORDER — METOPROLOL TARTRATE 25 MG PO TABS
25.0000 mg | ORAL_TABLET | Freq: Two times a day (BID) | ORAL | Status: DC
  Administered 2020-06-02 – 2020-06-12 (×19): 25 mg via ORAL
  Filled 2020-06-02 (×21): qty 1

## 2020-06-02 MED ORDER — CHLORHEXIDINE GLUCONATE CLOTH 2 % EX PADS
6.0000 | MEDICATED_PAD | Freq: Every day | CUTANEOUS | Status: DC
  Administered 2020-06-02 – 2020-06-06 (×5): 6 via TOPICAL

## 2020-06-02 MED ORDER — LIDOCAINE 2% (20 MG/ML) 5 ML SYRINGE
INTRAMUSCULAR | Status: DC | PRN
  Administered 2020-06-02: 60 mg via INTRAVENOUS

## 2020-06-02 MED ORDER — HEPARIN SODIUM (PORCINE) 1000 UNIT/ML IJ SOLN
INTRAMUSCULAR | Status: DC | PRN
  Administered 2020-06-02: 8000 [IU] via INTRAVENOUS

## 2020-06-02 MED ORDER — SUGAMMADEX SODIUM 200 MG/2ML IV SOLN
INTRAVENOUS | Status: DC | PRN
  Administered 2020-06-02: 200 mg via INTRAVENOUS

## 2020-06-02 MED ORDER — SODIUM CHLORIDE 0.9 % IV SOLN: 250.0000 mL | INTRAVENOUS | Status: DC | PRN

## 2020-06-02 MED ORDER — SODIUM CHLORIDE 0.9 % IV SOLN
1.0000 mg/h | Freq: Once | INTRAVENOUS | Status: AC
  Administered 2020-06-02: 17:00:00 1 mg/h
  Filled 2020-06-02: qty 10

## 2020-06-02 MED ORDER — EZETIMIBE 10 MG PO TABS
10.0000 mg | ORAL_TABLET | Freq: Every evening | ORAL | Status: DC
  Administered 2020-06-02 – 2020-06-11 (×10): 10 mg via ORAL
  Filled 2020-06-02 (×10): qty 1

## 2020-06-02 MED ORDER — FENTANYL CITRATE (PF) 100 MCG/2ML IJ SOLN: 25.0000 ug | INTRAMUSCULAR | Status: DC | PRN

## 2020-06-02 MED ORDER — MEMANTINE HCL 10 MG PO TABS
10.0000 mg | ORAL_TABLET | Freq: Every morning | ORAL | Status: DC
  Administered 2020-06-03 – 2020-06-12 (×10): 10 mg via ORAL
  Filled 2020-06-02 (×11): qty 1

## 2020-06-02 MED ORDER — PROMETHAZINE HCL 25 MG/ML IJ SOLN: 6.2500 mg | INTRAMUSCULAR | Status: DC | PRN

## 2020-06-02 MED ORDER — FENTANYL CITRATE (PF) 250 MCG/5ML IJ SOLN
INTRAMUSCULAR | Status: DC | PRN
  Administered 2020-06-02: 100 ug via INTRAVENOUS
  Administered 2020-06-02: 50 ug via INTRAVENOUS

## 2020-06-02 MED ORDER — LACTATED RINGERS IV SOLN: INTRAVENOUS | Status: DC | PRN

## 2020-06-02 MED ORDER — PHENYLEPHRINE 40 MCG/ML (10ML) SYRINGE FOR IV PUSH (FOR BLOOD PRESSURE SUPPORT)
PREFILLED_SYRINGE | INTRAVENOUS | Status: DC | PRN
  Administered 2020-06-02: 40 ug via INTRAVENOUS
  Administered 2020-06-02: 80 ug via INTRAVENOUS
  Administered 2020-06-02 (×2): 40 ug via INTRAVENOUS

## 2020-06-02 MED ORDER — SODIUM CHLORIDE 0.9 % IV SOLN
1.0000 mg/h | INTRAVENOUS | Status: DC
  Administered 2020-06-02 – 2020-06-03 (×2): 1 mg/h
  Filled 2020-06-02 (×5): qty 10

## 2020-06-02 MED ORDER — DEXAMETHASONE SODIUM PHOSPHATE 10 MG/ML IJ SOLN
INTRAMUSCULAR | Status: AC
  Filled 2020-06-02: qty 2

## 2020-06-02 MED ORDER — PANTOPRAZOLE SODIUM 40 MG PO TBEC
40.0000 mg | DELAYED_RELEASE_TABLET | Freq: Every day | ORAL | Status: DC
  Administered 2020-06-03 – 2020-06-12 (×9): 40 mg via ORAL
  Filled 2020-06-02 (×10): qty 1

## 2020-06-02 MED ORDER — METOPROLOL TARTRATE 5 MG/5ML IV SOLN
INTRAVENOUS | Status: AC
  Filled 2020-06-02: qty 5

## 2020-06-02 MED ORDER — LABETALOL HCL 5 MG/ML IV SOLN: 10.0000 mg | INTRAVENOUS | Status: DC | PRN

## 2020-06-02 MED ORDER — SUCCINYLCHOLINE CHLORIDE 200 MG/10ML IV SOSY
PREFILLED_SYRINGE | INTRAVENOUS | Status: DC | PRN
  Administered 2020-06-02: 100 mg via INTRAVENOUS

## 2020-06-02 MED ORDER — DILTIAZEM HCL 60 MG PO TABS: 60.0000 mg | ORAL_TABLET | Freq: Two times a day (BID) | ORAL | Status: DC

## 2020-06-02 MED ORDER — LABETALOL HCL 5 MG/ML IV SOLN
INTRAVENOUS | Status: DC | PRN
  Administered 2020-06-02 (×2): 10 mg via INTRAVENOUS

## 2020-06-02 MED ORDER — SODIUM CHLORIDE 0.9% FLUSH
3.0000 mL | Freq: Two times a day (BID) | INTRAVENOUS | Status: DC
  Administered 2020-06-02 – 2020-06-03 (×3): 3 mL via INTRAVENOUS

## 2020-06-02 MED ORDER — FENTANYL CITRATE (PF) 250 MCG/5ML IJ SOLN
INTRAMUSCULAR | Status: AC
  Filled 2020-06-02: qty 5

## 2020-06-02 MED ORDER — SODIUM CHLORIDE (PF) 0.9 % IJ SOLN
INTRAVENOUS | Status: DC | PRN
  Administered 2020-06-02: 13:00:00 20 mL via INTRAMUSCULAR

## 2020-06-02 MED ORDER — GLYCOPYRROLATE PF 0.2 MG/ML IJ SOSY
PREFILLED_SYRINGE | INTRAMUSCULAR | Status: AC
  Filled 2020-06-02: qty 1

## 2020-06-02 MED ORDER — ROCURONIUM BROMIDE 10 MG/ML (PF) SYRINGE
PREFILLED_SYRINGE | INTRAVENOUS | Status: DC | PRN
  Administered 2020-06-02: 50 mg via INTRAVENOUS

## 2020-06-02 MED ORDER — FINASTERIDE 5 MG PO TABS
5.0000 mg | ORAL_TABLET | Freq: Every day | ORAL | Status: DC
  Administered 2020-06-03 – 2020-06-12 (×9): 5 mg via ORAL
  Filled 2020-06-02 (×10): qty 1

## 2020-06-02 MED ORDER — NICARDIPINE HCL IN NACL 20-0.86 MG/200ML-% IV SOLN
3.0000 mg/h | INTRAVENOUS | Status: DC
  Administered 2020-06-02: 18:00:00 5 mg/h via INTRAVENOUS
  Filled 2020-06-02 (×2): qty 200

## 2020-06-02 MED ORDER — PHENYLEPHRINE 40 MCG/ML (10ML) SYRINGE FOR IV PUSH (FOR BLOOD PRESSURE SUPPORT)
PREFILLED_SYRINGE | INTRAVENOUS | Status: AC
  Filled 2020-06-02: qty 10

## 2020-06-02 MED ORDER — ONDANSETRON HCL 4 MG/2ML IJ SOLN
INTRAMUSCULAR | Status: AC
  Filled 2020-06-02: qty 4

## 2020-06-02 MED ORDER — CHLORHEXIDINE GLUCONATE 0.12 % MT SOLN
OROMUCOSAL | Status: AC
  Administered 2020-06-02: 11:00:00 15 mL via OROMUCOSAL
  Filled 2020-06-02: qty 15

## 2020-06-02 MED ORDER — SUCCINYLCHOLINE CHLORIDE 200 MG/10ML IV SOSY
PREFILLED_SYRINGE | INTRAVENOUS | Status: AC
  Filled 2020-06-02: qty 20

## 2020-06-02 MED ORDER — HYDROCHLOROTHIAZIDE 12.5 MG PO CAPS
12.5000 mg | ORAL_CAPSULE | Freq: Every day | ORAL | Status: DC
  Administered 2020-06-02 – 2020-06-12 (×10): 12.5 mg via ORAL
  Filled 2020-06-02 (×11): qty 1

## 2020-06-02 MED ORDER — HEPARIN SODIUM (PORCINE) 1000 UNIT/ML IJ SOLN
INTRAMUSCULAR | Status: AC
  Filled 2020-06-02: qty 1

## 2020-06-02 MED ORDER — CHLORHEXIDINE GLUCONATE 0.12 % MT SOLN
15.0000 mL | Freq: Once | OROMUCOSAL | Status: AC
  Filled 2020-06-02: qty 15

## 2020-06-02 MED ORDER — DEXAMETHASONE SODIUM PHOSPHATE 10 MG/ML IJ SOLN
INTRAMUSCULAR | Status: DC | PRN
  Administered 2020-06-02: 4 mg via INTRAVENOUS

## 2020-06-02 MED ORDER — PROPOFOL 10 MG/ML IV BOLUS
INTRAVENOUS | Status: AC
  Filled 2020-06-02: qty 20

## 2020-06-02 MED ORDER — AMIODARONE HCL 200 MG PO TABS
200.0000 mg | ORAL_TABLET | Freq: Every day | ORAL | Status: DC
Start: 2020-06-02 — End: 2020-06-02

## 2020-06-02 MED ORDER — ONDANSETRON HCL 4 MG/2ML IJ SOLN: 4.0000 mg | Freq: Four times a day (QID) | INTRAMUSCULAR | Status: DC | PRN

## 2020-06-02 MED ORDER — SODIUM CHLORIDE 0.9% FLUSH: 3.0000 mL | INTRAVENOUS | Status: DC | PRN

## 2020-06-02 MED ORDER — ROCURONIUM BROMIDE 10 MG/ML (PF) SYRINGE
PREFILLED_SYRINGE | INTRAVENOUS | Status: AC
  Filled 2020-06-02: qty 20

## 2020-06-02 MED ORDER — CEFAZOLIN SODIUM-DEXTROSE 2-4 GM/100ML-% IV SOLN
INTRAVENOUS | Status: AC
  Filled 2020-06-02: qty 100

## 2020-06-02 MED ORDER — METOPROLOL TARTRATE 25 MG PO TABS: 25.0000 mg | ORAL_TABLET | Freq: Once | ORAL | Status: DC

## 2020-06-02 SURGICAL SUPPLY — 57 items
BAG BANDED W/RUBBER/TAPE 36X54 (MISCELLANEOUS) ×3 IMPLANT
BAG SNAP BAND KOVER 36X36 (MISCELLANEOUS) ×3 IMPLANT
BLADE SURG 11 STRL SS (BLADE) ×3 IMPLANT
CANISTER SUCT 3000ML PPV (MISCELLANEOUS) ×3 IMPLANT
CATH ANGIO 5F BER2 65CM (CATHETERS) ×3 IMPLANT
CATH CROSS OVER TEMPO 5F (CATHETERS) ×3 IMPLANT
CATH INFUS 135CMX50CM (CATHETERS) ×3 IMPLANT
CATH OMNI FLUSH .035X70CM (CATHETERS) IMPLANT
CATH OMNI FLUSH 5F 65CM (CATHETERS) ×3 IMPLANT
CATH QUICKCROSS .035X135CM (MICROCATHETER) ×3 IMPLANT
CATH STRAIGHT 5FR 65CM (CATHETERS) ×3 IMPLANT
CHLORAPREP W/TINT 26 (MISCELLANEOUS) IMPLANT
COVER DOME SNAP 22 D (MISCELLANEOUS) ×3 IMPLANT
COVER PROBE W GEL 5X96 (DRAPES) ×3 IMPLANT
COVER SURGICAL LIGHT HANDLE (MISCELLANEOUS) ×3 IMPLANT
COVER WAND RF STERILE (DRAPES) ×3 IMPLANT
DERMABOND ADVANCED (GAUZE/BANDAGES/DRESSINGS) ×2
DERMABOND ADVANCED .7 DNX12 (GAUZE/BANDAGES/DRESSINGS) ×4 IMPLANT
DEVICE TORQUE H2O (MISCELLANEOUS) IMPLANT
DRAPE FEMORAL ANGIO 80X135IN (DRAPES) ×3 IMPLANT
DRAPE X-RAY CASS 24X20 (DRAPES) IMPLANT
DRSG TEGADERM 4X4.75 (GAUZE/BANDAGES/DRESSINGS) ×3 IMPLANT
GAUZE 4X4 16PLY RFD (DISPOSABLE) ×3 IMPLANT
GAUZE SPONGE 2X2 8PLY STRL LF (GAUZE/BANDAGES/DRESSINGS) ×2 IMPLANT
GLIDEWIRE ADV .035X180CM (WIRE) ×3 IMPLANT
GLIDEWIRE ADV .035X260CM (WIRE) ×3 IMPLANT
GLOVE BIOGEL PI IND STRL 7.5 (GLOVE) ×2 IMPLANT
GLOVE BIOGEL PI INDICATOR 7.5 (GLOVE) ×1
GLOVE SURG SS PI 7.5 STRL IVOR (GLOVE) ×3 IMPLANT
GOWN STRL REUS W/ TWL LRG LVL3 (GOWN DISPOSABLE) ×4 IMPLANT
GOWN STRL REUS W/ TWL XL LVL3 (GOWN DISPOSABLE) ×2 IMPLANT
GOWN STRL REUS W/TWL LRG LVL3 (GOWN DISPOSABLE) ×6
GOWN STRL REUS W/TWL XL LVL3 (GOWN DISPOSABLE) ×3
GUIDEWIRE ANGLED .035X150CM (WIRE) IMPLANT
KIT BASIN OR (CUSTOM PROCEDURE TRAY) ×3 IMPLANT
KIT TURNOVER KIT B (KITS) ×3 IMPLANT
NEEDLE PERC 18GX7CM (NEEDLE) ×3 IMPLANT
NS IRRIG 1000ML POUR BTL (IV SOLUTION) IMPLANT
PACK SURGICAL SETUP 50X90 (CUSTOM PROCEDURE TRAY) ×3 IMPLANT
PAD ARMBOARD 7.5X6 YLW CONV (MISCELLANEOUS) ×6 IMPLANT
PROTECTION STATION PRESSURIZED (MISCELLANEOUS) ×3
SET MICROPUNCTURE 5F STIFF (MISCELLANEOUS) ×3 IMPLANT
SHEATH AVANTI 11CM 5FR (SHEATH) IMPLANT
SHEATH PINNACLE 5F 10CM (SHEATH) ×3 IMPLANT
SHEATH PINNACLE ST 6F 45CM (SHEATH) ×3 IMPLANT
SPONGE GAUZE 2X2 STER 10/PKG (GAUZE/BANDAGES/DRESSINGS) ×1
STATION PROTECTION PRESSURIZED (MISCELLANEOUS) ×2 IMPLANT
STOPCOCK MORSE 400PSI 3WAY (MISCELLANEOUS) ×3 IMPLANT
SUT ETHILON 3 0 PS 1 (SUTURE) ×3 IMPLANT
SYR 10ML LL (SYRINGE) ×9 IMPLANT
SYR 20ML LL LF (SYRINGE) ×3 IMPLANT
SYR 30ML LL (SYRINGE) ×3 IMPLANT
SYR MEDRAD MARK V 150ML (SYRINGE) IMPLANT
TOWEL GREEN STERILE (TOWEL DISPOSABLE) ×6 IMPLANT
TUBING HIGH PRESSURE 120CM (CONNECTOR) ×3 IMPLANT
TUBING INJECTOR 48 (MISCELLANEOUS) ×3 IMPLANT
WIRE BENTSON .035X145CM (WIRE) ×6 IMPLANT

## 2020-06-02 NOTE — Op Note (Signed)
DATE OF SERVICE: 06/02/2020  PATIENT:  Donald Gordon  79 y.o. 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   POST-OPERATIVE DIAGNOSIS:  same  PROCEDURE:   US guided LCFA access Right lower angiography with third order cannulation Initiation of intra-arterial thrombolysis  SURGEON:  Surgeon(s) and Role:    * Cherre Robins, MD - Primary  ASSISTANT: none  ANESTHESIA:   general  EBL: min  BLOOD ADMINISTERED:none  DRAINS: none   LOCAL MEDICATIONS USED:  LIDOCAINE   SPECIMEN:  none  COUNTS: confirmed correct.  TOURNIQUET:  * No tourniquets in log *  PATIENT DISPOSITION:  PACU - hemodynamically stable.   Delay start of Pharmacological VTE agent (>24hrs) due to surgical blood loss or risk of bleeding: no  INDICATION FOR PROCEDURE: DARRIC PLANTE is a 79 y.o. male with thrombosed RLE fem-pop bypass. After careful discussion of risks, benefits, and alternatives the patient was offered thrombolysis. We specifically discussed need for return to the OR. The patient understood and wished to proceed.  OPERATIVE FINDINGS:  Occluded R femoral - popliteal bypass TP trunk patent PT patent to the ankle Successful deployment of infusion catheter across proximal anastomosis and throughout length of bypass  DESCRIPTION OF PROCEDURE: After identification of the patient in the pre-operative holding area, the patient was transferred to the operating room. The patient was positioned supine on the operating room table. Anesthesia was induced. The groins and right leg were prepped and draped in standard fashion. A surgical pause was performed confirming correct patient, procedure, and operative location.  To begin the left groin was anesthetized with 1% lidocaine. Using ultrasound guidance, the left common femoral artery was accessed with micropuncture technique. Fluoroscopy was used to confirm cannulation  over the femoral head. Sheathogram was not performed. The 14F sheath was upsized to 64F.   An 035 glidewire advantage was advanced into the distal aorta. Over the wire an omni flush catheter was advanced to the level of L2. Aortogram was performed revealing terminal aortic and iliac atherosclerotic disease, previous R external iliac stenting all of which was patent.    The right common iliac artery was selected with an 035 floppy angled glidewire. The wire was advanced into the profunda femoris artery. Over the wire the omni flush catheter was advanced into the external iliac artery. Selective angiography was performed revealing occluded bypass.  The decision was made to intervene. The patient was heparinized with 8000 units of heparin. The sheath was exchanged for a 49F x 45cm sheath.  I was able to cross the lesion with a bernstein catheter and 035 glidewire advantage. The wire was advanced past the distal anastomosis. A 035 quickcross catheter was advanced into the TP trunk. Confirmatory angiography revealed patency of the PT and peroneal below the occluded bypass.  A unifuse 50cm treatment length infusion catheter was positioned above the occluded bypass into the occluded bypass to near the distal anastomosis. Thrombolysis was initiated through the catheter.  The sheath was left in place. We ended the case here.  Upon completion of the case instrument and sharps counts were confirmed correct. The patient was transferred to the PACU in good condition. I was present for all portions of the procedure.  [name]. Stanford Breed, MD [facility] Phone Number: [phone] 06/02/2020 1:52 PM

## 2020-06-02 NOTE — ED Triage Notes (Signed)
Patient arrived with EMS from home reports right foot pain this evening , denies injury ,ambulatory , history of PAD .

## 2020-06-02 NOTE — Progress Notes (Signed)
Dr. Gloris Manchester notified of PO intake. Notified Dr. Stanford Breed of same.

## 2020-06-02 NOTE — ED Provider Notes (Signed)
[place] EMERGENCY DEPARTMENT Provider Note   CSN: [id number] Arrival date & time: 06/02/20  0023     History Chief Complaint  Patient presents with  . Foot Pain     SANDON YOHO is a 79 y.o. male.  HPI Patient is an 79 year old male with a significant medical history as noted below.  Patient has a significant history of PAD requiring stenting in the right lower extremity.  He is followed by Dr. Carlis Abbott with vascular surgery.  Anticoagulated on Plavix as well as Eliquis.  He states he has been compliant.  Around 6 PM yesterday evening he began experiencing pain, decreased sensation, decreased warmth in the right foot and lower leg.  Additionally reports some mild right leg swelling.  States it is similar to prior vascular crises in the past.  Patient denies chest pain or shortness of breath at this time.  No other physical complaints at this time.     Past Medical History:  Diagnosis Date  . Arthritis    "knees, elbows" (03/18/2018)  . Chronic edema    a. Chronic RLE edema.  Marland Kitchen COPD (chronic obstructive pulmonary disease) ([place])   . Coronary artery disease    a. MI s/p balloon 1996, details unclear.  Marland Kitchen Dysrhythmia    PROXIMAL MARGIN FIBRILATION  . GERD (gastroesophageal reflux disease)   . Hyperlipidemia   . Hypertension   . Myocardial infarction ([place]) 1996  . PAD (peripheral artery disease) ([place])    a. s/p stenting 08/2012, 02/2013.   Marland Kitchen PAF (paroxysmal atrial fibrillation) ([place])   . Sleep apnea    "have mask; have to start wearing it" (03/18/2018)  . Stroke [facility]) ~ 2014   denies residual on 03/18/2018  . Thrombosis of lower extremity    a. Listed on patient's medical bracelet - at [place].  Marland Kitchen Type II diabetes mellitus [facility])     Patient Active Problem List   Diagnosis Date Noted  . Chest pain 05/18/2020  . Mixed diabetic hyperlipidemia associated with type 2 diabetes mellitus ([place]) 05/18/2020  . Acute renal failure superimposed on stage 3a chronic kidney  disease ([place]) 05/18/2020  . 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 esophagitis 05/18/2020  . Chronic diastolic (congestive) heart failure ([place]) 05/18/2020  . Critical limb ischemia with history of revascularization of same extremity ([place]) 01/18/2020  . Critical lower limb ischemia ([place]) 08/31/2019  . PAD (peripheral artery disease) ([place]) 07/23/2019  . Ischemia of right lower extremity 01/09/2018  . Critical ischemia of foot ([place]) 01/09/2018  . Atrial fibrillation ([place]) 07/04/2014  . Atrial fibrillation with rapid ventricular response ([place])   . AF (paroxysmal atrial fibrillation) ([place]) 07/02/2014  . Essential hypertension 07/02/2014  . Coronary artery disease involving native coronary artery of native heart 07/02/2014  . Cough 07/02/2014  . Atrial fibrillation with RVR ([place]) 07/02/2014  . Type 2 diabetes mellitus with stage 3a chronic kidney disease, without long-term current use of insulin ([place]) 07/02/2014    Past Surgical History:  Procedure Laterality Date  . ABDOMINAL AORTOGRAM W/LOWER EXTREMITY N/A 07/25/2019   Procedure: ABDOMINAL AORTOGRAM W/LOWER EXTREMITY;  Surgeon: Waynetta Sandy, MD;  Location: [place] CV LAB;  Service: Cardiovascular;  Laterality: N/A;  . ANTERIOR LUMBAR FUSION  1981  . APPENDECTOMY    . APPLICATION OF WOUND VAC Right 01/09/2018   Procedure: APPLICATION OF WOUND VAC ON RIGHT LOWER LEG;  Surgeon: Conrad [place], MD;  Location: [place];  Service: Vascular;  Laterality: Right;  . BACK SURGERY    .  CATARACT EXTRACTION W/ INTRAOCULAR LENS  IMPLANT, BILATERAL Bilateral   . CORONARY ANGIOPLASTY WITH STENT PLACEMENT     "I've got 1 stent in there" (03/18/2018)  . FASCIOTOMY Right 01/09/2018   Procedure: FASCIOTOMY RIGHT LOWER LEG;  Surgeon: Conrad Bleckley, MD;  Location: [place];  Service: Vascular;  Laterality: Right;  . FASCIOTOMY CLOSURE Right 01/11/2018   Procedure: FASCIOTOMY CLOSURE RIGHT CALF;  Surgeon: Conrad [place], MD;  Location: [place];  Service: Vascular;  Laterality: Right;   . FEMORAL-POPLITEAL BYPASS GRAFT Right 01/09/2018   Procedure: RIGHT FEMORAL-POPLITEAL ARTERY BYPASS USING PROPATEN 6MM X 80CM VASCULAR GRAFT;  Surgeon: Conrad [place], MD;  Location: [place];  Service: Vascular;  Laterality: Right;  . HEMORRHOID SURGERY    . LEFT HEART CATH AND CORONARY ANGIOGRAPHY N/A 03/19/2018   Procedure: LEFT HEART CATH AND CORONARY ANGIOGRAPHY;  Surgeon: Martinique, Peter M, MD;  Location: [place] CV LAB;  Service: Cardiovascular;  Laterality: N/A;  . LOWER EXTREMITY ANGIOGRAPHY N/A 08/31/2019   Procedure: LOWER EXTREMITY ANGIOGRAPHY;  Surgeon: [name], MD;  Location: [place] CV LAB;  Service: Cardiovascular;  Laterality: N/A;  . LOWER EXTREMITY ANGIOGRAPHY Right 01/18/2020   Procedure: LOWER EXTREMITY ANGIOGRAPHY;  Surgeon: [name], MD;  Location: [place] CV LAB;  Service: Cardiovascular;  Laterality: Right;  . PATCH ANGIOPLASTY Right 01/09/2018   Procedure: PATCH ANGIOPLASTY USING Rueben Bash BIOLOGIC 1CM X 6CM PATCH;  Surgeon: Conrad [place], MD;  Location: [place];  Service: Vascular;  Laterality: Right;  . PERIPHERAL VASCULAR ATHERECTOMY Right 01/19/2020   Procedure: PERIPHERAL VASCULAR ATHERECTOMY;  Surgeon: [name], MD;  Location: [place] CV LAB;  Service: Cardiovascular;  Laterality: Right;  Femoral popliteal and tibioperoneal arteries.  Marland Kitchen PERIPHERAL VASCULAR BALLOON ANGIOPLASTY Right 09/01/2019   Procedure: PERIPHERAL VASCULAR BALLOON ANGIOPLASTY;  Surgeon: Waynetta Sandy, MD;  Location: [place] CV LAB;  Service: Cardiovascular;  Laterality: Right;  peroneal artery.  Marland Kitchen PERIPHERAL VASCULAR INTERVENTION Right 07/25/2019   Procedure: PERIPHERAL VASCULAR INTERVENTION;  Surgeon: Waynetta Sandy, MD;  Location: [place] CV LAB;  Service: Cardiovascular;  Laterality: Right;  SFA X 3  . PERIPHERAL VASCULAR INTERVENTION Right 09/01/2019   Procedure: PERIPHERAL VASCULAR INTERVENTION;  Surgeon: Waynetta Sandy, MD;  Location: [place] CV LAB;  Service: Cardiovascular;  Laterality: Right;  superficial femoral  . PERIPHERAL VASCULAR THROMBECTOMY Right 09/01/2019   Procedure: PERIPHERAL VASCULAR THROMBECTOMY;  Surgeon: Waynetta Sandy, MD;  Location: [place] CV LAB;  Service: Cardiovascular;  Laterality: Right;  . PERIPHERAL VASCULAR THROMBECTOMY Right 01/18/2020   Procedure: PERIPHERAL VASCULAR THROMBECTOMY;  Surgeon: [name], MD;  Location: [place] CV LAB;  Service: Cardiovascular;  Laterality: Right;  . PERIPHERAL VASCULAR THROMBECTOMY N/A 01/19/2020   Procedure: LYSIS RECHECK;  Surgeon: [name], MD;  Location: [place] CV LAB;  Service: Cardiovascular;  Laterality: N/A;  . POSTERIOR LUMBAR FUSION  1978  . THROMBECTOMY FEMORAL ARTERY Right 01/09/2018   Procedure: THROMBECTOMY RIGHT LOWER LEG;  Surgeon: Conrad [place], MD;  Location: [place];  Service: Vascular;  Laterality: Right;  . TONSILLECTOMY         Family History  Problem Relation Age of Onset  . CAD Mother   . CAD Brother   . Hypertension Other     Social History   Tobacco Use  . Smoking status: Former Smoker    Packs/day: 3.00    Years: 30.00    Pack years: 90.00  Quit date: 85    Years since quitting: 25.9  . Smokeless tobacco: Never Used  Vaping Use  . Vaping Use: Never used  Substance Use Topics  . Alcohol use: No    Comment: Remote alcohol use  . Drug use: Never    Home Medications Prior to Admission medications   Medication Sig Start Date End Date Taking? Authorizing Provider  acetaminophen (TYLENOL) 500 MG tablet Take 500 mg by mouth 2 (two) times daily as needed (for pain or headaches).     [provider]  amiodarone (PACERONE) 200 MG tablet Take 1 tablet (200 mg total) by mouth daily. 05/22/20   Dwyane Dee, MD  apixaban (ELIQUIS) 2.5 MG TABS tablet Take 1 tablet (2.5 mg total) by mouth 2 (two) times daily. 05/21/20   Dwyane Dee, MD   Cholecalciferol (VITAMIN D3 PO) Take 1 tablet by mouth daily.    [provider]  clopidogrel (PLAVIX) 75 MG tablet Take 1 tablet (75 mg total) by mouth daily. Patient taking differently: Take 75 mg by mouth at bedtime.  01/14/18   Dagoberto Ligas, PA-C  Cyanocobalamin (VITAMIN B-12 PO) Take 1 tablet by mouth daily.    [provider]  diltiazem (CARDIZEM) 60 MG tablet Take 1 tablet (60 mg total) by mouth every 12 (twelve) hours. 05/21/20   Dwyane Dee, MD  ezetimibe (ZETIA) 10 MG tablet Take 10 mg by mouth every evening.     [provider]  Ferrous Sulfate (IRON PO) Take 1 tablet by mouth daily.    [provider]  finasteride (PROSCAR) 5 MG tablet Take 5 mg by mouth daily.    [provider]  hydrochlorothiazide (MICROZIDE) 12.5 MG capsule Take 12.5 mg by mouth daily.    [provider]  ketorolac (ACULAR) 0.5 % ophthalmic solution Place 1 drop into the right eye daily.  Patient not taking: Reported on 05/29/2020 05/16/20   [provider]  memantine (NAMENDA) 10 MG tablet Take 10 mg by mouth in the morning.     [provider]  metoprolol tartrate (LOPRESSOR) 25 MG tablet Take 1 tablet (25 mg total) by mouth 2 (two) times daily. 05/21/20   Dwyane Dee, MD  nitroGLYCERIN (NITROSTAT) 0.4 MG SL tablet Place 0.4 mg under the tongue every 5 (five) minutes as needed for chest pain.     [provider]  pantoprazole (PROTONIX) 40 MG tablet Take 40 mg by mouth daily.    [provider]  ramipril (ALTACE) 10 MG capsule Take 10 mg by mouth 2 (two) times daily.    [provider]  rivastigmine (EXELON) 6 MG capsule Take 6 mg by mouth 2 (two) times daily.    [provider]    Allergies    Oxycodone, Statins, Donepezil, Imipramine, and Oxybutynin chloride  Review of Systems   Review of Systems  All other systems reviewed and are negative. Ten systems reviewed and are negative for acute  change, except as noted in the HPI.    Physical Exam Updated Vital Signs BP (!) 143/62 (BP Location: Left Arm)   Pulse (!) 56   Temp 98.3 F (36.8 C) (Oral)   Resp 14   Ht 5\' 9"  (1.753 m)   Wt 82 kg   SpO2 96%   BMI 26.70 kg/m   Physical Exam Vitals and nursing note reviewed.  Constitutional:      General: He is not in acute distress.    Appearance: Normal appearance. He is not ill-appearing,  toxic-appearing or diaphoretic.  HENT:     Head: Normocephalic and atraumatic.     Right Ear: External ear normal.     Left Ear: External ear normal.     Nose: Nose normal.     Mouth/Throat:     Mouth: Mucous membranes are moist.     Pharynx: Oropharynx is clear. No oropharyngeal exudate or posterior oropharyngeal erythema.  Eyes:     Extraocular Movements: Extraocular movements intact.  Cardiovascular:     Rate and Rhythm: Normal rate and regular rhythm.     Pulses: Normal pulses.     Heart sounds: Normal heart sounds. No murmur heard. No friction rub. No gallop.   Pulmonary:     Effort: Pulmonary effort is normal. No respiratory distress.     Breath sounds: Normal breath sounds. No stridor. No wheezing, rhonchi or rales.  Abdominal:     General: Abdomen is flat.     Tenderness: There is no abdominal tenderness.  Musculoskeletal:        General: Normal range of motion.     Cervical back: Normal range of motion and neck supple. No tenderness.  Skin:    General: Skin is warm and dry.     Coloration: Skin is pale.     Comments: Right foot and lower leg is pale and cool to the touch when compared to the left.  Trace edema noted in the right lower extremity.  Distal sensation is intact but subjectively decreased.  Unable to find dopplerable DP or PT pulses.  Neurological:     General: No focal deficit present.     Mental Status: He is alert and oriented to person, place, and time.  Psychiatric:        Mood and Affect: Mood normal.        Behavior: Behavior normal.    ED  Results / Procedures / Treatments   Labs (all labs ordered are listed, but only abnormal results are displayed) Labs Reviewed  BASIC METABOLIC PANEL - Abnormal; Notable for the following components:      Result Value   Glucose, Bld 129 (*)    Creatinine, Ser 1.29 (*)    GFR, Estimated 56 (*)    All other components within normal limits  CBC WITH DIFFERENTIAL/PLATELET - Abnormal; Notable for the following components:   RBC 4.12 (*)    Hemoglobin 12.2 (*)    HCT 38.4 (*)    All other components within normal limits  RESP PANEL BY RT-PCR (FLU A&B, COVID) [id number]   EKG EKG Interpretation  Date/Time:  Saturday June 02 2020 08:33:08 EST Ventricular Rate:  58 PR Interval:    QRS Duration: 110 QT Interval:  463 QTC Calculation: 455 R Axis:   129 Text Interpretation: Sinus rhythm Consider right ventricular hypertrophy No STEMI Confirmed by Nanda Quinton [phone]) on 06/02/2020 8:37:16 AM   Radiology DG Foot Complete Right  Result Date: 06/02/2020 CLINICAL DATA:  Pain EXAM: RIGHT FOOT COMPLETE - 3+ VIEW COMPARISON:  None. FINDINGS: No acute bony abnormality. Specifically, no fracture, subluxation, or dislocation. IMPRESSION: No acute bony abnormality. Electronically Signed   By: Rolm Baptise M.D.   On: 06/02/2020 00:49    Procedures Procedures (including critical care time)  Medications Ordered in ED Medications  0.9 %  sodium chloride infusion (has no administration in time range)    ED Course  I have reviewed the triage vital signs and the nursing notes.  Pertinent labs & imaging results that were available during  my care of the patient were reviewed by me and considered in my medical decision making (see chart for details).    MDM Rules/Calculators/A&P                          Patient is an 79 year old male with a complex revascularization history in the right lower extremity.  He has presented multiple times in the past year with acute occlusion of the right lower  extremity.  Patient was discussed with Dr. Stanford Breed with vascular surgery.  Patient was evaluated by him as well.  They believe he thrombosed his graft last night.  They are going to proceed with angiogram with thrombolysis and will go to the OR urgently.  Patient will be kept n.p.o.  I have ordered basic labs as well as a COVID-19 test.  Final Clinical Impression(s) / ED Diagnoses Final diagnoses:  Lower limb ischemia   Rx / DC Orders ED Discharge Orders    None       Rayna Sexton, PA-C 06/02/20 [id number]    Margette Fast, MD 06/03/20 [phone]

## 2020-06-02 NOTE — ED Notes (Signed)
Nadyne Coombes.PA made aware of chem8 lab results. ED-Lab.

## 2020-06-02 NOTE — Anesthesia Preprocedure Evaluation (Addendum)
Anesthesia Evaluation  Patient identified by MRN, date of birth, ID band Patient awake    Reviewed: Patient's Chart, lab work & pertinent test results  Airway Mallampati: II  TM Distance: >3 FB Neck ROM: Full    Dental  (+) Upper Dentures, Lower Dentures   Pulmonary sleep apnea and Continuous Positive Airway Pressure Ventilation , COPD, former smoker,    Pulmonary exam normal        Cardiovascular hypertension, Pt. on medications and Pt. on home beta blockers + CAD, + Past MI, + Peripheral Vascular Disease and +CHF  + dysrhythmias Atrial Fibrillation  Rhythm:Irregular Rate:Normal  MI 1996, right fem/pop bypass 07/21   Neuro/Psych CVA negative psych ROS   GI/Hepatic Neg liver ROS, GERD  Medicated,  Endo/Other  diabetes  Renal/GU Renal disease     Musculoskeletal  (+) Arthritis ,   Abdominal (+)  Abdomen: soft. Bowel sounds: normal.  Peds  Hematology negative hematology ROS (+)   Anesthesia Other Findings   Reproductive/Obstetrics                            Anesthesia Physical Anesthesia Plan  ASA: IV and emergent  Anesthesia Plan: General   Post-op Pain Management:    Induction: Intravenous  PONV Risk Score and Plan: 2  Airway Management Planned: Mask and Oral ETT  Additional Equipment: Arterial line  Intra-op Plan:   Post-operative Plan: Extubation in OR  Informed Consent: I have reviewed the patients History and Physical, chart, labs and discussed the procedure including the risks, benefits and alternatives for the proposed anesthesia with the patient or authorized representative who has indicated his/her understanding and acceptance.     Dental advisory given  Plan Discussed with: CRNA  Anesthesia Plan Comments: (Case declared emergent by surgeon as patient had chips at ~7am Lab Results      Component                Value               Date                      WBC                       4.1                 06/02/2020                HGB                      11.9 (L)            06/02/2020                HCT                      35.0 (L)            06/02/2020                MCV                      93.2                06/02/2020                PLT  218                 06/02/2020           Lab Results      Component                Value               Date                      NA                       138                 06/02/2020                K                        6.4 (HH)            06/02/2020                CO2                      23                  06/02/2020                GLUCOSE                  105 (H)             06/02/2020                BUN                      27 (H)              06/02/2020                CREATININE               1.20                06/02/2020                CALCIUM                  9.1                 06/02/2020                GFRNONAA                 56 (L)              06/02/2020                GFRAA                    52 (L)              01/19/2020           ECHO 11/21: 1. Left ventricular ejection fraction, by estimation, is 60 to 65%. The  left ventricle has normal function. The left ventricle has no regional  wall motion abnormalities. Left ventricular diastolic parameters are  consistent with Grade II diastolic  dysfunction (pseudonormalization). Elevated left atrial pressure.  2. Right ventricular systolic function  is normal. The right ventricular  size is normal. There is normal pulmonary artery systolic pressure. The  estimated right ventricular systolic pressure is 27.0 mmHg.  3. Left atrial size was mild to moderately dilated.  4. Right atrial size was mildly dilated.  5. The mitral valve is normal in structure. Trivial mitral valve  regurgitation. No evidence of mitral stenosis.  6. The aortic valve is normal in structure. Aortic valve regurgitation is  trivial. Mild aortic  valve sclerosis is present, with no evidence of  aortic valve stenosis.  7. The inferior vena cava is normal in size with greater than 50%  respiratory variability, suggesting right atrial pressure of 3 mmHg. )       Anesthesia Quick Evaluation

## 2020-06-02 NOTE — Progress Notes (Addendum)
ANTICOAGULATION CONSULT NOTE  Pharmacy Consult for heparin Indication: Occluded right common femoral to below knee popliteal bypass graft  Allergies  Allergen Reactions  . Oxycodone Nausea And Vomiting  . Statins Other (See Comments)    Muscle and Bone pain   . Amiodarone     Caused issues and was stopped by MD- possible was the cause for the patient's heart "going in and out of rhythm" multiple times  . Donepezil Other (See Comments)    Per [facility]  . Imipramine Other (See Comments)    Per [facility]  . Oxybutynin Chloride Swelling and Other (See Comments)    Per [facility]    Patient Measurements: Height: 5\' 9"  (175.3 cm) Weight: 79.7 kg (175 lb 11.3 oz) IBW/kg (Calculated) : 70.7   Vital Signs: Temp: 97.8 F (36.6 C) (12/11 2053) Temp Source: Oral (12/11 2053) BP: 133/71 (12/11 2300) Pulse Rate: 59 (12/11 2300)  Labs: Recent Labs    06/02/20 0847 06/02/20 0959 06/02/20 1150 06/02/20 2230  HGB 12.2* 11.9* 11.6* 10.4*  HCT 38.4* 35.0* 34.0* 31.1*  PLT 218  --   --  142*  APTT  --   --   --  71*  HEPARINUNFRC  --   --   --  0.95*  CREATININE 1.29* 1.20 1.10  --     Estimated Creatinine Clearance: 53.6 mL/min (by C-G formula based on SCr of 1.1 mg/dL).  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 On apixaban PTA for afib and last dose was 12/10  -Cath directed lysis; tPA 1mg /hr -Heparin 8000 units given in OR -Heparin 800 units/hr (through sheath; do not adjust without talking to MD)  Heparin level 0.95 (affected by apixaban), PTT 71 sec (therapeutic) on sheath heparin 800 units/hr. Pt wound dressing is saturated with blood - RN to continue to watch and update MD as needed. Hgb down to 10.4.   Goal of Therapy:  Heparin level 0.3-0.7 units/ml aPTT 66-102 seconds Monitor platelets by anticoagulation protocol: Yes   Plan:   -Check heparin  level and aPTT in a.m. -Add systemic heparin if needed to maintain goal -F/u continued bleeding from dressing site -Plan back to OR on 12/12 1145 and tPA and heparin to be discontinued on call to [place], PharmD, BCPS Please see amion for complete clinical pharmacist phone list 06/02/2020 11:18 PM   Addendum (0500) Heparin level 0.84 (affected by apixaban), PTT 73 sec (therapeutic) on sheath heparin 800 units/hr. Pt wound still leaking blood - RN d/w MD and he says this amount of leakage is ok. Hgb down to 10, plt down to 128.   Plan: Same as above F/u post-OR  Sherlon Handing, PharmD, BCPS Please see amion for complete clinical pharmacist phone list 06/03/2020 4:48 AM

## 2020-06-02 NOTE — Progress Notes (Signed)
Patient had solid food early this AM. Patient with acute on chronic ischemic leg. Should not delay therapy despite NPO status.  [name]. Stanford Breed, MD [facility] Phone Number: [phone] 06/02/2020 11:13 AM

## 2020-06-02 NOTE — ED Notes (Signed)
Called report to [place], Therapist, sports, going to short stay [employer].

## 2020-06-02 NOTE — Progress Notes (Signed)
ANTICOAGULATION CONSULT NOTE - Initial Consult  Pharmacy Consult for heparin Indication: Occluded right common femoral to below knee popliteal bypass graft  Allergies  Allergen Reactions  . Oxycodone Nausea Only  . Statins Other (See Comments)    Muscle and Bone pain Muscle and Bone pain  . Donepezil Other (See Comments)    [facility]  . Imipramine Other (See Comments)    [facility]  . Oxybutynin Chloride Swelling and Other (See Comments)    [facility]    Patient Measurements: Height: 5\' 9"  (175.3 cm) Weight: 82 kg (180 lb 12.4 oz) IBW/kg (Calculated) : 70.7   Vital Signs: Temp: 97.5 F (36.4 C) (12/11 1545) Temp Source: Oral (12/11 0901) BP: 151/51 (12/11 1545) Pulse Rate: 65 (12/11 1545)  Labs: Recent Labs    06/02/20 0847 06/02/20 0959 06/02/20 1150  HGB 12.2* 11.9* 11.6*  HCT 38.4* 35.0* 34.0*  PLT 218  --   --   CREATININE 1.29* 1.20 1.10    Estimated Creatinine Clearance: 53.6 mL/min (by C-G formula based on SCr of 1.1 mg/dL).   Medical History: Past Medical History:  Diagnosis Date  . Arthritis    "knees, elbows" (03/18/2018)  . Chronic edema    a. Chronic RLE edema.  Marland Kitchen COPD (chronic obstructive pulmonary disease) ([place])   . Coronary artery disease    a. MI s/p balloon 1996, details unclear.  Marland Kitchen Dysrhythmia    PROXIMAL MARGIN FIBRILATION  . GERD (gastroesophageal reflux disease)   . Hyperlipidemia   . Hypertension   . Myocardial infarction ([place]) 1996  . PAD (peripheral artery disease) ([place])    a. s/p stenting 08/2012, 02/2013.   Marland Kitchen PAF (paroxysmal atrial fibrillation) ([place])   . Sleep apnea    "have mask; have to start wearing it" (03/18/2018)  . Stroke [facility]) ~ 2014   denies residual on 03/18/2018  . Thrombosis of lower extremity    a. Listed on patient's medical bracelet - at [place].  Marland Kitchen Type II diabetes mellitus (H[facility])     Medications:  Medications Prior to Admission  Medication Sig Dispense Refill Last Dose  . apixaban (ELIQUIS) 2.5 MG TABS tablet Take 1  tablet (2.5 mg total) by mouth 2 (two) times daily. 30 tablet 3 06/01/2020 at Unknown time  . metoprolol tartrate (LOPRESSOR) 25 MG tablet Take 1 tablet (25 mg total) by mouth 2 (two) times daily. 60 tablet 3 06/01/2020 at 1600  . acetaminophen (TYLENOL) 500 MG tablet Take 500 mg by mouth 2 (two) times daily as needed (for pain or headaches).      Marland Kitchen amiodarone (PACERONE) 200 MG tablet Take 1 tablet (200 mg total) by mouth daily. 30 tablet 3   . Cholecalciferol (VITAMIN D3 PO) Take 1 tablet by mouth daily.     . clopidogrel (PLAVIX) 75 MG tablet Take 1 tablet (75 mg total) by mouth daily. (Patient taking differently: Take 75 mg by mouth at bedtime. ) 90 tablet 1   . Cyanocobalamin (VITAMIN B-12 PO) Take 1 tablet by mouth daily.     Marland Kitchen diltiazem (CARDIZEM) 60 MG tablet Take 1 tablet (60 mg total) by mouth every 12 (twelve) hours. 60 tablet 3   . ezetimibe (ZETIA) 10 MG tablet Take 10 mg by mouth every evening.      . Ferrous Sulfate (IRON PO) Take 1 tablet by mouth daily.     . finasteride (PROSCAR) 5 MG tablet Take 5 mg by mouth daily.     . hydrochlorothiazide (MICROZIDE) 12.5  MG capsule Take 12.5 mg by mouth daily.     Marland Kitchen ketorolac (ACULAR) 0.5 % ophthalmic solution Place 1 drop into the right eye daily.  (Patient not taking: Reported on 05/29/2020)     . memantine (NAMENDA) 10 MG tablet Take 10 mg by mouth in the morning.      . nitroGLYCERIN (NITROSTAT) 0.4 MG SL tablet Place 0.4 mg under the tongue every 5 (five) minutes as needed for chest pain.      . pantoprazole (PROTONIX) 40 MG tablet Take 40 mg by mouth daily.     . ramipril (ALTACE) 10 MG capsule Take 10 mg by mouth 2 (two) times daily.     . rivastigmine (EXELON) 6 MG capsule Take 6 mg by mouth 2 (two) times daily.      Scheduled:  . amiodarone  200 mg Oral Daily  . Chlorhexidine Gluconate Cloth  6 each Topical Daily  . diltiazem  60 mg Oral Q12H  . ezetimibe  10 mg Oral QPM  . finasteride  5 mg Oral Daily  . hydrochlorothiazide   12.5 mg Oral Daily  . [START ON 06/03/2020] memantine  10 mg Oral q AM  . metoprolol tartrate  25 mg Oral Once  . metoprolol tartrate  25 mg Oral BID  . 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 On apixaban PTA for afib and last dose was 12/10 -on heparin 8000 units/hr through the sheath -heparin 8000 units IV given in OR  Goal of Therapy:  Heparin level 0.3-0.7 units/ml aPTT 66-102 seconds Monitor platelets by anticoagulation protocol: Yes   Plan:   -Check heparin level and aPTT in 6 hours -Add systemic heparin if needed to maintain goal -plans to go back to OR on 12/11 and tPA and heparin to be discontinued on call to [place], PharmD Clinical Pharmacist **Pharmacist phone directory can now be found on [email or web address] ([email or web address]).  Listed under [place].

## 2020-06-02 NOTE — Progress Notes (Signed)
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 male who presents for 3 month follow-up of right lower extremity given multiple complex interventions.  He has a very complex history and records show in 2019 he had acute limb ischemia of the right leg that required thrombectomy of his tibials and a right common femoral to below-knee popliteal bypass by Dr. Bridgett Larsson with propaten.  Ultimately his bypass later occluded in February 2021 and he had stent of his right TP trunk up through the bypass with Viabahn's by Dr. [name].  He then reoccluded this and in March 2021 had thrombolysis of the stented bypass and this required proximal stent of his bypass with Eluvia as well as the distal stent into the TP trunk with a [place] again by Dr. [name].  Apparently had some interim work done on the right leg at the [place] as well.  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  He has remained on Plavix and Eliquis.  During his most recent event had come off his anticoagulation for colonoscopy when he presented in July with acute on chronic limb ischcemia.    Today's visit he has noticed a bit more numbness in the right leg particularly when walking.  Doesn't seem to bother him at night.  06/02/20: presents to [facility] ER for evaluation of acute right lower extremity pain and numbness. Pain began last night at about 5PM. Pain is typical of previous episodes of acute limb ischemia. Plans for endovascular intervention 06/29/19.    Past Medical History:  Diagnosis Date  . Arthritis    "knees, elbows" (03/18/2018)  . Chronic edema    a. Chronic RLE edema.  Marland Kitchen COPD (chronic obstructive pulmonary disease) ([place])   . Coronary  artery disease    a. MI s/p balloon 1996, details unclear.  Marland Kitchen Dysrhythmia    PROXIMAL MARGIN FIBRILATION  . GERD (gastroesophageal reflux disease)   . Hyperlipidemia   . Hypertension   . Myocardial infarction ([place]) 1996  . PAD (peripheral artery disease) ([place])    a. s/p stenting 08/2012, 02/2013.   Marland Kitchen PAF (paroxysmal atrial fibrillation) ([place])   . Sleep apnea    "have mask; have to start wearing it" (03/18/2018)  . Stroke [facility]) ~ 2014   denies residual on 03/18/2018  . Thrombosis of lower extremity    a. Listed on patient's medical bracelet - at [place].  Marland Kitchen Type II diabetes mellitus ([place])     Past Surgical History:  Procedure Laterality Date  . ABDOMINAL AORTOGRAM W/LOWER EXTREMITY N/A 07/25/2019   Procedure: ABDOMINAL AORTOGRAM W/LOWER EXTREMITY;  Surgeon: Waynetta Sandy, MD;  Location: [place] [facility];  Service: Cardiovascular;  Laterality: N/A;  . ANTERIOR LUMBAR FUSION  1981  . APPENDECTOMY    . APPLICATION OF WOUND VAC Right 01/09/2018   Procedure: APPLICATION OF WOUND VAC ON RIGHT LOWER LEG;  Surgeon: Conrad [place], MD;  Location: [place];  Service: Vascular;  Laterality: Right;  . BACK SURGERY    . CATARACT EXTRACTION W/ INTRAOCULAR LENS  IMPLANT, BILATERAL Bilateral   . CORONARY ANGIOPLASTY WITH STENT PLACEMENT     "I've got 1 stent in there" (03/18/2018)  . FASCIOTOMY Right 01/09/2018  Procedure: FASCIOTOMY RIGHT LOWER LEG;  Surgeon: Conrad [place], MD;  Location: [place];  Service: Vascular;  Laterality: Right;  . FASCIOTOMY CLOSURE Right 01/11/2018   Procedure: FASCIOTOMY CLOSURE RIGHT CALF;  Surgeon: Conrad [place], MD;  Location: [place];  Service: Vascular;  Laterality: Right;  . FEMORAL-POPLITEAL BYPASS GRAFT Right 01/09/2018   Procedure: RIGHT FEMORAL-POPLITEAL ARTERY BYPASS USING PROPATEN 6MM X 80CM VASCULAR GRAFT;  Surgeon: Conrad Ali Molina, MD;  Location: [place];  Service: Vascular;  Laterality: Right;  . HEMORRHOID SURGERY    . LEFT HEART CATH AND CORONARY ANGIOGRAPHY N/A  03/19/2018   Procedure: LEFT HEART CATH AND CORONARY ANGIOGRAPHY;  Surgeon: [place], Peter M, MD;  Location: [place] [facility];  Service: Cardiovascular;  Laterality: N/A;  . LOWER EXTREMITY ANGIOGRAPHY N/A 08/31/2019   Procedure: LOWER EXTREMITY ANGIOGRAPHY;  Surgeon: [name], MD;  Location: [place] CV [facility];  Service: Cardiovascular;  Laterality: N/A;  . LOWER EXTREMITY ANGIOGRAPHY Right 01/18/2020   Procedure: LOWER EXTREMITY ANGIOGRAPHY;  Surgeon: [name], MD;  Location: [place] CV [facility];  Service: Cardiovascular;  Laterality: Right;  . PATCH ANGIOPLASTY Right 01/09/2018   Procedure: PATCH ANGIOPLASTY USING Rueben Bash BIOLOGIC 1CM X 6CM PATCH;  Surgeon: Conrad [place], MD;  Location: [place];  Service: Vascular;  Laterality: Right;  . PERIPHERAL VASCULAR ATHERECTOMY Right 01/19/2020   Procedure: PERIPHERAL VASCULAR ATHERECTOMY;  Surgeon: [name], MD;  Location: [place] [facility];  Service: Cardiovascular;  Laterality: Right;  Femoral popliteal and tibioperoneal arteries.  Marland Kitchen PERIPHERAL VASCULAR BALLOON ANGIOPLASTY Right 09/01/2019   Procedure: PERIPHERAL VASCULAR BALLOON ANGIOPLASTY;  Surgeon: Waynetta Sandy, MD;  Location: [place] [facility];  Service: Cardiovascular;  Laterality: Right;  peroneal artery.  Marland Kitchen PERIPHERAL VASCULAR INTERVENTION Right 07/25/2019   Procedure: PERIPHERAL VASCULAR INTERVENTION;  Surgeon: Waynetta Sandy, MD;  Location: [place] [facility];  Service: Cardiovascular;  Laterality: Right;  SFA X 3  . PERIPHERAL VASCULAR INTERVENTION Right 09/01/2019   Procedure: PERIPHERAL VASCULAR INTERVENTION;  Surgeon: Waynetta Sandy, MD;  Location: [place] [facility];  Service: Cardiovascular;  Laterality: Right;  superficial femoral  . PERIPHERAL VASCULAR THROMBECTOMY Right 09/01/2019   Procedure: PERIPHERAL VASCULAR THROMBECTOMY;  Surgeon: Waynetta Sandy, MD;  Location: [place] [facility];  Service: Cardiovascular;   Laterality: Right;  . PERIPHERAL VASCULAR THROMBECTOMY Right 01/18/2020   Procedure: PERIPHERAL VASCULAR THROMBECTOMY;  Surgeon: [name], MD;  Location: [place] [facility];  Service: Cardiovascular;  Laterality: Right;  . PERIPHERAL VASCULAR THROMBECTOMY N/A 01/19/2020   Procedure: LYSIS RECHECK;  Surgeon: [name], MD;  Location: [place] [facility];  Service: Cardiovascular;  Laterality: N/A;  . POSTERIOR LUMBAR FUSION  1978  . THROMBECTOMY FEMORAL ARTERY Right 01/09/2018   Procedure: THROMBECTOMY RIGHT LOWER LEG;  Surgeon: Conrad [place], MD;  Location: [place];  Service: Vascular;  Laterality: Right;  . TONSILLECTOMY      Family History  Problem Relation Age of Onset  . CAD Mother   . CAD Brother   . Hypertension Other     SOCIAL HISTORY: Social History   Tobacco Use  . Smoking status: Former Smoker    Packs/day: 3.00    Years: 30.00    Pack years: 90.00    Quit date: 1996    Years since quitting: 25.9  . Smokeless tobacco: Never Used  Substance Use Topics  . Alcohol use: No    Comment: Remote alcohol use  Allergies  Allergen Reactions  . Oxycodone Nausea Only  . Statins Other (See Comments)    Muscle and Bone pain Muscle and Bone pain  . Donepezil Other (See Comments)    [facility]  . Imipramine Other (See Comments)    [facility]  . Oxybutynin Chloride Swelling and Other (See Comments)    [facility]    No current facility-administered medications for this encounter.   Current Outpatient Medications  Medication Sig Dispense Refill  . acetaminophen (TYLENOL) 500 MG tablet Take 500 mg by mouth 2 (two) times daily as needed (for pain or headaches).     Marland Kitchen amiodarone (PACERONE) 200 MG tablet Take 1 tablet (200 mg total) by mouth daily. 30 tablet 3  . apixaban (ELIQUIS) 2.5 MG TABS tablet Take 1 tablet (2.5 mg total) by mouth 2 (two) times daily. 30 tablet 3  . Cholecalciferol (VITAMIN D3 PO) Take 1 tablet by mouth daily.    . clopidogrel (PLAVIX) 75 MG  tablet Take 1 tablet (75 mg total) by mouth daily. (Patient taking differently: Take 75 mg by mouth at bedtime. ) 90 tablet 1  . Cyanocobalamin (VITAMIN B-12 PO) Take 1 tablet by mouth daily.    Marland Kitchen diltiazem (CARDIZEM) 60 MG tablet Take 1 tablet (60 mg total) by mouth every 12 (twelve) hours. 60 tablet 3  . ezetimibe (ZETIA) 10 MG tablet Take 10 mg by mouth every evening.     . Ferrous Sulfate (IRON PO) Take 1 tablet by mouth daily.    . finasteride (PROSCAR) 5 MG tablet Take 5 mg by mouth daily.    . hydrochlorothiazide (MICROZIDE) 12.5 MG capsule Take 12.5 mg by mouth daily.    Marland Kitchen ketorolac (ACULAR) 0.5 % ophthalmic solution Place 1 drop into the right eye daily.  (Patient not taking: Reported on 05/29/2020)    . memantine (NAMENDA) 10 MG tablet Take 10 mg by mouth in the morning.     . metoprolol tartrate (LOPRESSOR) 25 MG tablet Take 1 tablet (25 mg total) by mouth 2 (two) times daily. 60 tablet 3  . nitroGLYCERIN (NITROSTAT) 0.4 MG SL tablet Place 0.4 mg under the tongue every 5 (five) minutes as needed for chest pain.     . pantoprazole (PROTONIX) 40 MG tablet Take 40 mg by mouth daily.    . ramipril (ALTACE) 10 MG capsule Take 10 mg by mouth 2 (two) times daily.    . rivastigmine (EXELON) 6 MG capsule Take 6 mg by mouth 2 (two) times daily.      REVIEW OF SYSTEMS:  [X]  denotes positive finding, [ ]  denotes negative finding Cardiac  Comments:  Chest pain or chest pressure:    Shortness of breath upon exertion:    Short of breath when lying flat:    Irregular heart rhythm:        Vascular    Pain in calf, thigh, or hip brought on by ambulation:    Pain in feet at night that wakes you up from your sleep:     Blood clot in your veins:    Leg swelling:         Pulmonary    Oxygen at home:    Productive cough:     Wheezing:         Neurologic    Sudden weakness in arms or legs:     Sudden numbness in arms or legs:     Sudden onset of difficulty speaking or slurred speech:  Temporary loss of vision in one eye:     Problems with dizziness:         Gastrointestinal    Blood in stool:     Vomited blood:         Genitourinary    Burning when urinating:     Blood in urine:        Psychiatric    Major depression:         Hematologic    Bleeding problems:    Problems with blood clotting too easily:        Skin    Rashes or ulcers:        Constitutional    Fever or chills:      PHYSICAL EXAM: Vitals:   06/02/20 0857 06/02/20 0858 06/02/20 0859 06/02/20 0901  BP:    (!) 147/55  Pulse: (!) 58 (!) 58 (!) 58 (!) 58  Resp: (!) 21 19 (!) 21 17  Temp:    97.7 F (36.5 C)  TempSrc:    Oral  SpO2: 96% 95% 96% 96%  Weight:      Height:        GENERAL: The patient is a well-nourished male, in no acute distress. The vital signs are documented above. CARDIAC: There is a regular rate and rhythm.  VASCULAR:  Femoral pulses palpable bilaterally No palpable right pedal pulses, no doppler flow in R foot. Strong doppler flow in left foot. R foot cool and pale. Motor and sensory function intact in foot. PULMONARY: No respiratory distress. ABDOMEN: Soft and non-tender.  DATA:   Clinic Data 05/29/20: Right lower extremity arterial duplex today shows a moderate 50 to 74% stenosis in the distal graft.  ABI's on the right have dropped from 1.02 to 0.88  Assessment/Plan:  79 year old male with very complex revascularization history in the right lower extremity as noted above in the HPI.  He has presented multiple times in the past year with acute occlusion of right lower extremity.   He was planned to undergo angiogram with intervention for developing ultrasound evidence of moderate stenosis in the distal graft with a drop in his ABIs.  I suspect he thrombosed his graft last night. Given his acute on chronic presentation and preserved motor / sensory function in the foot, I think it is safe to proceed with angiogram with thrombolysis. To OR urgently. NPO.  Check COVID test.  [name]. Stanford Breed, MD [facility] Phone Number: [phone] 06/02/2020 9:10 AM

## 2020-06-02 NOTE — Anesthesia Procedure Notes (Signed)
Procedure Name: Intubation Date/Time: 06/02/2020 12:12 PM Performed by: Janene Harvey, CRNA Pre-anesthesia Checklist: Patient identified, Emergency Drugs available, Suction available and Patient being monitored Patient Re-evaluated:Patient Re-evaluated prior to induction Oxygen Delivery Method: Circle system utilized Preoxygenation: Pre-oxygenation with 100% oxygen Induction Type: IV induction and Rapid sequence Laryngoscope Size: Miller and 4 Grade View: Grade I Tube type: Oral Tube size: 7.5 mm Number of attempts: 1 Airway Equipment and Method: Stylet Placement Confirmation: ETT inserted through vocal cords under direct vision,  positive ETCO2 and breath sounds checked- equal and bilateral Secured at: 22 cm Tube secured with: Tape Dental Injury: Teeth and Oropharynx as per pre-operative assessment

## 2020-06-02 NOTE — Transfer of Care (Signed)
Immediate Anesthesia Transfer of Care Note  Patient: Donald Gordon  Procedure(s) Performed: RIGHT LOWER EXTREMITY ANGIOGRAM INTERVENTION (Right Groin) PLACEMENT OF LYSIS CATHETER (Left Groin)  Patient Location: PACU  Anesthesia Type:General  Level of Consciousness: awake  Airway & Oxygen Therapy: Patient Spontanous Breathing  Post-op Assessment: Report given to RN  Post vital signs: Reviewed  Last Vitals:  Vitals Value Taken Time  BP 165/91 06/02/20 1645  Temp 36.4 C 06/02/20 1545  Pulse 60 06/02/20 1654  Resp 17 06/02/20 1654  SpO2 95 % 06/02/20 1654  Vitals shown include unvalidated device data.  Last Pain:  Vitals:   06/02/20 1450  TempSrc:   PainSc: 0-No pain         Complications: No complications documented.

## 2020-06-02 NOTE — Anesthesia Postprocedure Evaluation (Signed)
Anesthesia Post Note  Patient: Donald Gordon  Procedure(s) Performed: RIGHT LOWER EXTREMITY ANGIOGRAM INTERVENTION (Right Groin) PLACEMENT OF LYSIS CATHETER (Left Groin)     Patient location during evaluation: PACU Anesthesia Type: General Level of consciousness: awake and alert Pain management: pain level controlled Vital Signs Assessment: post-procedure vital signs reviewed and stable Respiratory status: spontaneous breathing, nonlabored ventilation, respiratory function stable and patient connected to nasal cannula oxygen Cardiovascular status: blood pressure returned to baseline and stable Postop Assessment: no apparent nausea or vomiting Anesthetic complications: no   No complications documented.  Last Vitals:  Vitals:   06/02/20 1541 06/02/20 1545  BP:  (!) 151/51  Pulse: 70 65  Resp:  17  Temp:  (!) 36.4 C  SpO2:  99%    Last Pain:  Vitals:   06/02/20 1450  TempSrc:   PainSc: 0-No pain                 Belenda Cruise P Kennita Pavlovich

## 2020-06-02 NOTE — Anesthesia Procedure Notes (Signed)
Arterial Line Insertion Start/End12/04/2020 11:45 AM Performed by: Colin Benton, CRNA, CRNA  Preanesthetic checklist: patient identified and risks and benefits discussed Lidocaine 1% used for infiltration Left, radial was placed Catheter size: 20 G Hand hygiene performed , maximum sterile barriers used  and Seldinger technique used Allen's test indicative of satisfactory collateral circulation Attempts: 2 Procedure performed without using ultrasound guided technique. Following insertion, dressing applied and Biopatch. Post procedure assessment: normal  Patient tolerated the procedure well with no immediate complications.

## 2020-06-02 NOTE — Progress Notes (Signed)
Notified Dr Hassel Neth of patient's BP per a-line, pt is hypertensive, I was instructed to go by the cuff pressure vs the a-line pressure. No other orders received. Will continue to monitor patient closely.

## 2020-06-02 NOTE — Progress Notes (Signed)
Dr Stanford Breed to bs to access patient. Patients surgical dressing has moderate bloody drainage, Dr Stanford Breed states this is normal and to be expected. No orders received on this at this time. Patient remains stable with hypertension. Dr Stanford Breed placed an order for metoprolol tartrate 25mg  PO. Will follow through with order. Report called to Andrey Farmer.

## 2020-06-03 ENCOUNTER — Inpatient Hospital Stay (HOSPITAL_COMMUNITY): Payer: Medicare Other | Admitting: Certified Registered Nurse Anesthetist

## 2020-06-03 ENCOUNTER — Encounter (HOSPITAL_COMMUNITY)
Admission: EM | Disposition: A | Discharge status: Skilled Nursing Facility | Payer: Self-pay | Source: Home / Self Care | Attending: Vascular Surgery

## 2020-06-03 ENCOUNTER — Encounter (HOSPITAL_COMMUNITY): Payer: Self-pay | Admitting: Vascular Surgery

## 2020-06-03 DIAGNOSIS — I998 Other disorder of circulatory system: Secondary | ICD-10-CM | POA: Diagnosis not present

## 2020-06-03 DIAGNOSIS — T82868A Thrombosis of vascular prosthetic devices, implants and grafts, initial encounter: Secondary | ICD-10-CM | POA: Diagnosis not present

## 2020-06-03 HISTORY — PX: LOWER EXTREMITY ANGIOGRAM: SHX5508

## 2020-06-03 HISTORY — PX: ANGIOPLASTY: SHX39

## 2020-06-03 LAB — CBC
HCT: 29.7 % — ABNORMAL LOW (ref 39.0–52.0)
HCT: 29.4 % — ABNORMAL LOW (ref 39.0–52.0)
HCT: 27.2 % — ABNORMAL LOW (ref 39.0–52.0)
Hemoglobin: 10 g/dL — ABNORMAL LOW (ref 13.0–17.0)
Hemoglobin: 9.7 g/dL — ABNORMAL LOW (ref 13.0–17.0)
Hemoglobin: 9 g/dL — ABNORMAL LOW (ref 13.0–17.0)
MCH: 30.9 pg (ref 26.0–34.0)
MCH: 30.6 pg (ref 26.0–34.0)
MCH: 31 pg (ref 26.0–34.0)
MCHC: 33.7 g/dL (ref 30.0–36.0)
MCHC: 33 g/dL (ref 30.0–36.0)
MCHC: 33.1 g/dL (ref 30.0–36.0)
MCV: 91.7 fL (ref 80.0–100.0)
MCV: 92.7 fL (ref 80.0–100.0)
MCV: 93.8 fL (ref 80.0–100.0)
Platelets: 128 10*3/uL — ABNORMAL LOW (ref 150–400)
Platelets: 140 10*3/uL — ABNORMAL LOW (ref 150–400)
Platelets: 146 10*3/uL — ABNORMAL LOW (ref 150–400)
RBC: 3.24 MIL/uL — ABNORMAL LOW (ref 4.22–5.81)
RBC: 3.17 MIL/uL — ABNORMAL LOW (ref 4.22–5.81)
RBC: 2.9 MIL/uL — ABNORMAL LOW (ref 4.22–5.81)
RDW: 13.7 % (ref 11.5–15.5)
RDW: 13.7 % (ref 11.5–15.5)
RDW: 13.9 % (ref 11.5–15.5)
WBC: 4.2 10*3/uL (ref 4.0–10.5)
WBC: 8 10*3/uL (ref 4.0–10.5)
WBC: 11.9 10*3/uL — ABNORMAL HIGH (ref 4.0–10.5)
nRBC: 0 % (ref 0.0–0.2)
nRBC: 0 % (ref 0.0–0.2)
nRBC: 0 % (ref 0.0–0.2)

## 2020-06-03 LAB — GLUCOSE, CAPILLARY
Glucose-Capillary: 113 mg/dL — ABNORMAL HIGH (ref 70–99)
Glucose-Capillary: 116 mg/dL — ABNORMAL HIGH (ref 70–99)
Glucose-Capillary: 118 mg/dL — ABNORMAL HIGH (ref 70–99)
Glucose-Capillary: 124 mg/dL — ABNORMAL HIGH (ref 70–99)
Glucose-Capillary: 196 mg/dL — ABNORMAL HIGH (ref 70–99)
Glucose-Capillary: 92 mg/dL (ref 70–99)

## 2020-06-03 LAB — FIBRINOGEN
Fibrinogen: 202 mg/dL — ABNORMAL LOW (ref 210–475)
Fibrinogen: 163 mg/dL — ABNORMAL LOW (ref 210–475)
Fibrinogen: 167 mg/dL — ABNORMAL LOW (ref 210–475)

## 2020-06-03 LAB — SURGICAL PCR SCREEN
MRSA, PCR: NEGATIVE
Staphylococcus aureus: NEGATIVE

## 2020-06-03 LAB — HEPARIN LEVEL (UNFRACTIONATED)
Heparin Unfractionated: 0.84 IU/mL — ABNORMAL HIGH (ref 0.30–0.70)
Heparin Unfractionated: 0.67 IU/mL (ref 0.30–0.70)
Heparin Unfractionated: 1.1 IU/mL — ABNORMAL HIGH (ref 0.30–0.70)

## 2020-06-03 LAB — POCT ACTIVATED CLOTTING TIME
Activated Clotting Time: 142 seconds
Activated Clotting Time: 225 seconds

## 2020-06-03 LAB — APTT
aPTT: 73 seconds — ABNORMAL HIGH (ref 24–36)
aPTT: 61 seconds — ABNORMAL HIGH (ref 24–36)

## 2020-06-03 SURGERY — ANGIOGRAM, LOWER EXTREMITY
Anesthesia: General | Site: Leg Lower | Laterality: Right

## 2020-06-03 MED ORDER — PROPOFOL 10 MG/ML IV BOLUS
INTRAVENOUS | Status: AC
  Filled 2020-06-03: qty 20

## 2020-06-03 MED ORDER — ATORVASTATIN CALCIUM 40 MG PO TABS
40.0000 mg | ORAL_TABLET | Freq: Every day | ORAL | Status: DC
  Administered 2020-06-03 – 2020-06-11 (×9): 40 mg via ORAL
  Filled 2020-06-03 (×9): qty 1

## 2020-06-03 MED ORDER — PHENYLEPHRINE HCL-NACL 10-0.9 MG/250ML-% IV SOLN
INTRAVENOUS | Status: DC | PRN
  Administered 2020-06-03: 15 ug/min via INTRAVENOUS

## 2020-06-03 MED ORDER — ORAL CARE MOUTH RINSE: 15.0000 mL | Freq: Once | OROMUCOSAL | Status: AC

## 2020-06-03 MED ORDER — ONDANSETRON HCL 4 MG/2ML IJ SOLN: 4.0000 mg | Freq: Four times a day (QID) | INTRAMUSCULAR | Status: DC | PRN

## 2020-06-03 MED ORDER — SODIUM CHLORIDE 0.9 % IV SOLN: 250.0000 mL | INTRAVENOUS | Status: DC | PRN

## 2020-06-03 MED ORDER — HEPARIN (PORCINE) 25000 UT/250ML-% IV SOLN
1050.0000 [IU]/h | INTRAVENOUS | Status: DC
  Administered 2020-06-03: 22:00:00 800 [IU]/h via INTRAVENOUS
  Administered 2020-06-04: 22:00:00 1050 [IU]/h via INTRAVENOUS
  Administered 2020-06-04: 15:00:00 800 [IU]/h via INTRAVENOUS
  Filled 2020-06-03 (×2): qty 250

## 2020-06-03 MED ORDER — LABETALOL HCL 5 MG/ML IV SOLN
10.0000 mg | INTRAVENOUS | Status: DC | PRN
  Administered 2020-06-06 (×2): 10 mg via INTRAVENOUS
  Filled 2020-06-03 (×2): qty 4

## 2020-06-03 MED ORDER — FENTANYL CITRATE (PF) 250 MCG/5ML IJ SOLN
INTRAMUSCULAR | Status: AC
  Filled 2020-06-03: qty 5

## 2020-06-03 MED ORDER — INSULIN ASPART 100 UNIT/ML ~~LOC~~ SOLN: 0.0000 [IU] | Freq: Every day | SUBCUTANEOUS | Status: DC

## 2020-06-03 MED ORDER — ASPIRIN EC 81 MG PO TBEC
81.0000 mg | DELAYED_RELEASE_TABLET | Freq: Every day | ORAL | Status: DC
  Administered 2020-06-05 – 2020-06-12 (×8): 81 mg via ORAL
  Filled 2020-06-03 (×9): qty 1

## 2020-06-03 MED ORDER — PHENYLEPHRINE 40 MCG/ML (10ML) SYRINGE FOR IV PUSH (FOR BLOOD PRESSURE SUPPORT)
PREFILLED_SYRINGE | INTRAVENOUS | Status: AC
  Filled 2020-06-03: qty 10

## 2020-06-03 MED ORDER — CHLORHEXIDINE GLUCONATE 0.12 % MT SOLN
OROMUCOSAL | Status: AC
  Administered 2020-06-03: 12:00:00 15 mL via OROMUCOSAL
  Filled 2020-06-03: qty 15

## 2020-06-03 MED ORDER — IOHEXOL 300 MG/ML  SOLN
INTRAMUSCULAR | Status: DC | PRN
  Administered 2020-06-03: 14:00:00 40 mL

## 2020-06-03 MED ORDER — HYDRALAZINE HCL 20 MG/ML IJ SOLN
5.0000 mg | INTRAMUSCULAR | Status: DC | PRN
Start: 2020-06-03 — End: 2020-06-08

## 2020-06-03 MED ORDER — CEFAZOLIN SODIUM-DEXTROSE 2-3 GM-%(50ML) IV SOLR
INTRAVENOUS | Status: DC | PRN
  Administered 2020-06-03: 2 g via INTRAVENOUS

## 2020-06-03 MED ORDER — BUPIVACAINE HCL (PF) 0.25 % IJ SOLN
INTRAMUSCULAR | Status: DC | PRN
  Administered 2020-06-03: 6 mL

## 2020-06-03 MED ORDER — ONDANSETRON HCL 4 MG/2ML IJ SOLN: 4.0000 mg | Freq: Once | INTRAMUSCULAR | Status: DC | PRN

## 2020-06-03 MED ORDER — FENTANYL CITRATE (PF) 100 MCG/2ML IJ SOLN: 25.0000 ug | INTRAMUSCULAR | Status: DC | PRN

## 2020-06-03 MED ORDER — SODIUM CHLORIDE 0.9% FLUSH
3.0000 mL | Freq: Two times a day (BID) | INTRAVENOUS | Status: DC
  Administered 2020-06-03 – 2020-06-12 (×13): 3 mL via INTRAVENOUS

## 2020-06-03 MED ORDER — SODIUM CHLORIDE 0.9 % IV SOLN
INTRAVENOUS | Status: DC | PRN
  Administered 2020-06-03: 14:00:00 500 mL

## 2020-06-03 MED ORDER — ACETAMINOPHEN 10 MG/ML IV SOLN: 1000.0000 mg | Freq: Once | INTRAVENOUS | Status: DC | PRN

## 2020-06-03 MED ORDER — SODIUM CHLORIDE 0.9 % IV SOLN: INTRAVENOUS | Status: DC | PRN

## 2020-06-03 MED ORDER — SODIUM CHLORIDE 0.9 % IV SOLN: INTRAVENOUS | Status: AC

## 2020-06-03 MED ORDER — CLOPIDOGREL BISULFATE 75 MG PO TABS
75.0000 mg | ORAL_TABLET | Freq: Every day | ORAL | Status: DC
  Administered 2020-06-05 – 2020-06-12 (×8): 75 mg via ORAL
  Filled 2020-06-03 (×9): qty 1

## 2020-06-03 MED ORDER — LACTATED RINGERS IV SOLN: INTRAVENOUS | Status: DC

## 2020-06-03 MED ORDER — ONDANSETRON HCL 4 MG/2ML IJ SOLN
INTRAMUSCULAR | Status: AC
  Filled 2020-06-03: qty 2

## 2020-06-03 MED ORDER — SODIUM CHLORIDE 0.9% FLUSH: 3.0000 mL | INTRAVENOUS | Status: DC | PRN

## 2020-06-03 MED ORDER — ONDANSETRON HCL 4 MG/2ML IJ SOLN
INTRAMUSCULAR | Status: DC | PRN
  Administered 2020-06-03: 4 mg via INTRAVENOUS

## 2020-06-03 MED ORDER — PROPOFOL 500 MG/50ML IV EMUL
INTRAVENOUS | Status: DC | PRN
  Administered 2020-06-03: 85 ug/kg/min via INTRAVENOUS

## 2020-06-03 MED ORDER — INSULIN ASPART 100 UNIT/ML ~~LOC~~ SOLN
0.0000 [IU] | Freq: Three times a day (TID) | SUBCUTANEOUS | Status: DC
  Administered 2020-06-04 – 2020-06-09 (×3): 1 [IU] via SUBCUTANEOUS

## 2020-06-03 MED ORDER — PROPOFOL 10 MG/ML IV BOLUS
INTRAVENOUS | Status: DC | PRN
  Administered 2020-06-03: 20 mg via INTRAVENOUS

## 2020-06-03 MED ORDER — CHLORHEXIDINE GLUCONATE 0.12 % MT SOLN: 15.0000 mL | Freq: Once | OROMUCOSAL | Status: AC

## 2020-06-03 MED ORDER — HEPARIN SODIUM (PORCINE) 1000 UNIT/ML IJ SOLN
INTRAMUSCULAR | Status: DC | PRN
  Administered 2020-06-03: 1000 [IU] via INTRAVENOUS
  Administered 2020-06-03: 5000 [IU] via INTRAVENOUS

## 2020-06-03 SURGICAL SUPPLY — 54 items
ADH SKN CLS APL DERMABOND .7 (GAUZE/BANDAGES/DRESSINGS) ×2
APL PRP STRL LF DISP 70% ISPRP (MISCELLANEOUS)
BAG BANDED W/RUBBER/TAPE 36X54 (MISCELLANEOUS) ×2 IMPLANT
BAG EQP BAND 135X91 W/RBR TAPE (MISCELLANEOUS) ×1
BAG SNAP BAND KOVER 36X36 (MISCELLANEOUS) ×2 IMPLANT
BLADE SURG 11 STRL SS (BLADE) ×2 IMPLANT
CANISTER SUCT 3000ML PPV (MISCELLANEOUS) ×2 IMPLANT
CATH ANGIO 5F BER2 65CM (CATHETERS) IMPLANT
CATH OMNI FLUSH .035X70CM (CATHETERS) IMPLANT
CATH QUICKCROSS .035X135CM (MICROCATHETER) ×2 IMPLANT
CATH SOFT-VU ST 4F 90CM (CATHETERS) ×2 IMPLANT
CHLORAPREP W/TINT 26 (MISCELLANEOUS) IMPLANT
CLOSURE PERCLOSE PROSTYLE (VASCULAR PRODUCTS) ×2 IMPLANT
COVER DOME SNAP 22 D (MISCELLANEOUS) ×2 IMPLANT
COVER PROBE W GEL 5X96 (DRAPES) ×2 IMPLANT
COVER SURGICAL LIGHT HANDLE (MISCELLANEOUS) ×2 IMPLANT
COVER WAND RF STERILE (DRAPES) ×2 IMPLANT
DCB RANGER 4.0X60 135 (BALLOONS) ×1 IMPLANT
DERMABOND ADVANCED (GAUZE/BANDAGES/DRESSINGS) ×2
DERMABOND ADVANCED .7 DNX12 (GAUZE/BANDAGES/DRESSINGS) ×2 IMPLANT
DEVICE TORQUE H2O (MISCELLANEOUS) IMPLANT
DRAPE FEMORAL ANGIO 80X135IN (DRAPES) ×2 IMPLANT
DRAPE X-RAY CASS 24X20 (DRAPES) IMPLANT
GAUZE 4X4 16PLY RFD (DISPOSABLE) ×2 IMPLANT
GLIDEWIRE ADV .035X260CM (WIRE) ×2 IMPLANT
GLOVE BIOGEL PI IND STRL 7.5 (GLOVE) ×1 IMPLANT
GLOVE BIOGEL PI INDICATOR 7.5 (GLOVE) ×1
GLOVE SURG SS PI 7.5 STRL IVOR (GLOVE) ×2 IMPLANT
GOWN STRL REUS W/ TWL LRG LVL3 (GOWN DISPOSABLE) ×2 IMPLANT
GOWN STRL REUS W/ TWL XL LVL3 (GOWN DISPOSABLE) ×1 IMPLANT
GOWN STRL REUS W/TWL LRG LVL3 (GOWN DISPOSABLE) ×4
GOWN STRL REUS W/TWL XL LVL3 (GOWN DISPOSABLE) ×2
GUIDEWIRE ANGLED .035X150CM (WIRE) IMPLANT
KIT BASIN OR (CUSTOM PROCEDURE TRAY) ×2 IMPLANT
KIT ENCORE 26 ADVANTAGE (KITS) ×2 IMPLANT
KIT TURNOVER KIT B (KITS) ×2 IMPLANT
NEEDLE PERC 18GX7CM (NEEDLE) ×2 IMPLANT
NS IRRIG 1000ML POUR BTL (IV SOLUTION) IMPLANT
PACK SURGICAL SETUP 50X90 (CUSTOM PROCEDURE TRAY) ×2 IMPLANT
PAD ARMBOARD 7.5X6 YLW CONV (MISCELLANEOUS) ×4 IMPLANT
PROTECTION STATION PRESSURIZED (MISCELLANEOUS) ×2
RANGER DCB 4.0X60 135 (BALLOONS) ×2
SET MICROPUNCTURE 5F STIFF (MISCELLANEOUS) ×2 IMPLANT
SHEATH AVANTI 11CM 5FR (SHEATH) IMPLANT
STATION PROTECTION PRESSURIZED (MISCELLANEOUS) ×1 IMPLANT
STOPCOCK MORSE 400PSI 3WAY (MISCELLANEOUS) ×2 IMPLANT
SYR 10ML LL (SYRINGE) ×6 IMPLANT
SYR 20ML LL LF (SYRINGE) ×2 IMPLANT
SYR 30ML LL (SYRINGE) ×2 IMPLANT
SYR MEDRAD MARK V 150ML (SYRINGE) IMPLANT
TOWEL GREEN STERILE (TOWEL DISPOSABLE) ×4 IMPLANT
TUBING HIGH PRESSURE 120CM (CONNECTOR) ×2 IMPLANT
WIRE BENTSON .035X145CM (WIRE) ×2 IMPLANT
WIRE G V18X300CM (WIRE) ×2 IMPLANT

## 2020-06-03 NOTE — Anesthesia Procedure Notes (Signed)
Procedure Name: MAC Date/Time: 06/03/2020 1:00 PM Performed by: Inda Coke, CRNA Pre-anesthesia Checklist: Patient identified, Emergency Drugs available, Suction available, Timeout performed and Patient being monitored Patient Re-evaluated:Patient Re-evaluated prior to induction Oxygen Delivery Method: Simple face mask Induction Type: IV induction Dental Injury: Teeth and Oropharynx as per pre-operative assessment

## 2020-06-03 NOTE — Transfer of Care (Signed)
Immediate Anesthesia Transfer of Care Note  Patient: Donald Gordon  Procedure(s) Performed: RIGHT LOWER EXTREMITY ANGIOGRAM from FEMORAL APPROACH (Right Leg Lower) BALLOON ANGIOPLASTY POPITEAL ARTERY (Right Leg Lower)  Patient Location: PACU  Anesthesia Type:MAC  Level of Consciousness: awake and alert   Airway & Oxygen Therapy: Patient Spontanous Breathing and Patient connected to face mask oxygen  Post-op Assessment: Report given to RN and Post -op Vital signs reviewed and stable  Post vital signs: Reviewed and stable  Last Vitals:  Vitals Value Taken Time  BP 118/56 06/03/20 1422  Temp    Pulse 120 06/03/20 1423  Resp 16 06/03/20 1423  SpO2 94 % 06/03/20 1423  Vitals shown include unvalidated device data.  Last Pain:  Vitals:   06/03/20 1208  TempSrc:   PainSc: 8       Patients Stated Pain Goal: 3 (59/74/71 8550)  Complications: No complications documented.

## 2020-06-03 NOTE — Anesthesia Postprocedure Evaluation (Signed)
Anesthesia Post Note  Patient: Donald Gordon  Procedure(s) Performed: RIGHT LOWER EXTREMITY ANGIOGRAM from FEMORAL APPROACH (Right Leg Lower) BALLOON ANGIOPLASTY POPITEAL ARTERY (Right Leg Lower)     Patient location during evaluation: PACU Anesthesia Type: MAC Level of consciousness: awake and alert Pain management: pain level controlled Vital Signs Assessment: post-procedure vital signs reviewed and stable Respiratory status: spontaneous breathing, nonlabored ventilation, respiratory function stable and patient connected to nasal cannula oxygen Cardiovascular status: stable and blood pressure returned to baseline Postop Assessment: no apparent nausea or vomiting Anesthetic complications: no   No complications documented.  Last Vitals:  Vitals:   06/03/20 1823 06/03/20 1830  BP:    Pulse: (!) 53 (!) 51  Resp: (!) 21 16  Temp:    SpO2: 98% 98%    Last Pain:  Vitals:   06/03/20 1823  TempSrc:   PainSc: 6                  Navraj Dreibelbis P Aayush Gelpi

## 2020-06-03 NOTE — Progress Notes (Signed)
Back from OR.  Left groin with pressure dsg intact with copious amt bright red blood coming from dressing.  Manual pressure applied x20 minutes.  Hemostatis obtained.  New pressure dressing applied.  Will continue to closely monitor.

## 2020-06-03 NOTE — Progress Notes (Signed)
° °  Donald & PLAN:  CAMP Gordon is a 79 y.o. male with ongoing intra-arterial thrombolysis of right common femoral to popliteal bypass. Doppler flow restored in foot. Plan for angiogram today with treatment of underlying stenosis.   SUBJECTIVE:  No complaints. Leg feels better.   OBJECTIVE:  BP (!) 179/47    Pulse (!) 54    Temp 98.1 F (36.7 C) (Oral)    Resp 17    Ht 5\' 9"  (1.753 m)    Wt 79.7 kg    SpO2 95%    BMI 25.95 kg/m   Intake/Output Summary (Last 24 hours) at 06/03/2020 1057 Last data filed at 06/03/2020 1000 Gross per 24 hour  Intake 2661.89 ml  Output 675 ml  Net 1986.89 ml    Constitutional: well appearing. no acute distress. Cardiac: RRR. Vascular: + doppler flow in R PT. Left groin dressing saturated. Pulmonary: unlabored  CBC Latest Ref Rng & Units 06/03/2020 06/02/2020 06/02/2020  WBC 4.0 - 10.5 K/uL 4.2 3.5(L) -  Hemoglobin 13.0 - 17.0 g/dL 10.0(L) 10.4(L) 11.6(L)  Hematocrit 39.0 - 52.0 % 29.7(L) 31.1(L) 34.0(L)  Platelets 150 - 400 K/uL 128(L) 142(L) -     CMP Latest Ref Rng & Units 06/02/2020 06/02/2020 06/02/2020  Glucose 70 - 99 mg/dL 94 105(H) 129(H)  BUN 8 - 23 mg/dL 18 27(H) 18  Creatinine 0.61 - 1.24 mg/dL 1.10 1.20 1.29(H)  Sodium 135 - 145 mmol/L 141 138 140  Potassium 3.5 - 5.1 mmol/L 4.3 6.4(HH) 4.3  Chloride 98 - 111 mmol/L 105 106 106  CO2 22 - 32 mmol/L - - 23  Calcium 8.9 - 10.3 mg/dL - - 9.1  Total Protein 6.5 - 8.1 g/dL - - -  Total Bilirubin 0.3 - 1.2 mg/dL - - -  Alkaline Phos 38 - 126 U/L - - -  AST 15 - 41 U/L - - -  ALT 0 - 44 U/L - - -   Fibrinogen 237 --> 202 --> 163  PTT 61-73 overnight  Estimated Creatinine Clearance: 53.6 mL/min (by C-G formula based on SCr of 1.1 mg/dL).  [name]. Stanford Breed, MD [facility] Phone Number: [phone] 06/03/2020 10:57 AM

## 2020-06-03 NOTE — Progress Notes (Signed)
ANTICOAGULATION CONSULT NOTE  Pharmacy Consult for heparin Indication: Occluded right common femoral to below knee popliteal bypass graft  Allergies  Allergen Reactions  . Oxycodone Nausea And Vomiting  . Statins Other (See Comments)    Muscle and Bone pain   . Amiodarone     Caused issues and was stopped by MD- possible was the cause for the patient's heart "going in and out of rhythm" multiple times  . Donepezil Other (See Comments)    Per [facility]  . Imipramine Other (See Comments)    Per [facility]  . Oxybutynin Chloride Swelling and Other (See Comments)    Per [facility]    Patient Measurements: Height: 5\' 9"  (175.3 cm) Weight: 79.7 kg (175 lb 11.3 oz) IBW/kg (Calculated) : 70.7   Vital Signs: Temp: 98.3 F (36.8 C) (12/12 1420) Temp Source: Oral (12/12 1139) BP: 118/56 (12/12 1420) Pulse Rate: 109 (12/12 1450)  Labs: Recent Labs    06/02/20 0847 06/02/20 0959 06/02/20 1150 06/02/20 2230 06/03/20 0343 06/03/20 1014  HGB 12.2* 11.9* 11.6* 10.4* 10.0* 9.7*  HCT 38.4* 35.0* 34.0* 31.1* 29.7* 29.4*  PLT 218  --   --  142* 128* 140*  APTT  --   --   --  71* 73* 61*  HEPARINUNFRC  --   --   --  0.95* 0.84* 0.67  CREATININE 1.29* 1.20 1.10  --   --   --     Estimated Creatinine Clearance: 53.6 mL/min (by C-G formula based on SCr of 1.1 mg/dL).  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 On apixaban PTA for afib and last dose was 12/10  He is now s/p drug-coated balloon angioplasty 12/12 and heparin to restart 8 hours post sheath removal (removed ~ 3pm)  Goal of Therapy:  Heparin level 0.3-0.7 units/ml aPTT 66-102 seconds Monitor platelets by anticoagulation protocol: Yes   Plan:   -start heparin 800 units/hr at 11pm -heparin level, aPTT in 8 hrs, CBC daily  Hildred Laser, PharmD Clinical Pharmacist **Pharmacist phone directory  can now be found on [email or web address] ([email or web address]).  Listed under [place].

## 2020-06-03 NOTE — Op Note (Signed)
DATE OF SERVICE: 06/03/2020  PATIENT:  Donald Gordon  79 y.o. male  PRE-OPERATIVE DIAGNOSIS:  CHRONIC LIMB ISCHEMIA RIGHT  POST-OPERATIVE DIAGNOSIS:  CHRONIC LIMB ISCHEMIA RIGHT  PROCEDURE:   Right lower extremity angiography with third order cannulation Drug-coated balloon angioplasty of tibioperoneal trunk (4x24mm Ranger)  SURGEON:  Surgeon(s) and Role:    * Cherre Robins, MD - Primary  ASSISTANT: none  ANESTHESIA:   local and IV sedation  EBL: min  BLOOD ADMINISTERED:none  DRAINS: none   LOCAL MEDICATIONS USED:  BUPIVICAINE   SPECIMEN:  none  COUNTS: confirmed correct.  TOURNIQUET:  * No tourniquets in log *  PATIENT DISPOSITION:  PACU - hemodynamically stable.   Delay start of Pharmacological VTE agent (>24hrs) due to surgical blood loss or risk of bleeding: no  INDICATION FOR PROCEDURE: [name] is a 79 y.o. male with thrombosed right femoral-popliteal bypass graft. Catheter directed thrombolysis was initiated 06/02/20. During the morning of 06/03/20 he was noted to have return of doppler flow in his foot. After careful discussion of risks, benefits, and alternatives the patient was offered angiogram with intervention. The patient understood and wished to proceed.  OPERATIVE FINDINGS: >75% heterogenous stenosis throughout TP trunk and distal bypass anastomosis. Likely in-stent / edge re-stenosis. Successful resolution with DCB.  DESCRIPTION OF PROCEDURE: After identification of the patient in the pre-operative holding area, the patient was transferred to the operating room. The patient was positioned supine on the operating room table. Anesthesia was induced. The groin and existing sheath were prepped and draped in standard fashion. A surgical pause was performed confirming correct patient, procedure, and operative location.  Over a 035 glidewire advantage, the thrombolysis catheter was removed. Angiography of the right lower extremity was performed. This  revealed:  Patent proximal anastomosis. Equipment did not allow steep RAO projection. Entire bypass graft appeared patent without stenosis. >75% heterogenous stenosis throughout TP trunk and distal bypass anastomosis. Likely in-stent / edge re-stenosis. Flow through PT trunk into peroneal and PT. PT without stenosis to the foot.  The decision was made to intervene. The patient was re-heparinized. We exchanged our wire for a V18 wire and placed this into the posterior tibial artery. We then performed angioplasty of the tibioperoneal trunk with a 4x43mm Ranger drug coated angioplasty balloon. Follow up angiography revealed resolution of the stenosis.   A perclose device was used to close the arteriotomy. This was found to be hemostatic. A clean bandage was applied.  Upon completion of the case instrument and sharps counts were confirmed correct. The patient was transferred to the PACU in good condition. I was present for all portions of the procedure.  [name]. Stanford Breed, MD [facility] Phone Number: [phone] 06/03/2020 2:07 PM

## 2020-06-03 NOTE — Progress Notes (Signed)
Pt states it is ok for me to update his daughter Donald Gordon about his procedure today.  Called and updated pt's daughter,  Donald Gordon.  Questions answered.

## 2020-06-03 NOTE — Anesthesia Preprocedure Evaluation (Addendum)
Anesthesia Evaluation  Patient identified by MRN, date of birth, ID band Patient awake    Reviewed: Patient's Chart, lab work & pertinent test results  Airway Mallampati: II  TM Distance: >3 FB Neck ROM: Full    Dental  (+) Upper Dentures, Lower Dentures   Pulmonary sleep apnea and Continuous Positive Airway Pressure Ventilation , COPD, former smoker,    Pulmonary exam normal        Cardiovascular hypertension, Pt. on medications and Pt. on home beta blockers + CAD, + Past MI, + Peripheral Vascular Disease and +CHF  + dysrhythmias Atrial Fibrillation  Rhythm:Irregular Rate:Normal  MI 1996, right fem/pop bypass 07/21   Neuro/Psych CVA negative psych ROS   GI/Hepatic Neg liver ROS, GERD  Medicated,  Endo/Other  diabetes  Renal/GU Renal disease     Musculoskeletal  (+) Arthritis ,   Abdominal (+)  Abdomen: soft. Bowel sounds: normal.  Peds  Hematology negative hematology ROS (+)   Anesthesia Other Findings   Reproductive/Obstetrics                             Anesthesia Physical  Anesthesia Plan  ASA: III  Anesthesia Plan: MAC   Post-op Pain Management:    Induction: Intravenous  PONV Risk Score and Plan: 1  Airway Management Planned: Simple Face Mask, Natural Airway and Nasal Cannula  Additional Equipment: Arterial line  Intra-op Plan:   Post-operative Plan:   Informed Consent: I have reviewed the patients History and Physical, chart, labs and discussed the procedure including the risks, benefits and alternatives for the proposed anesthesia with the patient or authorized representative who has indicated his/her understanding and acceptance.     Dental advisory given  Plan Discussed with: CRNA  Anesthesia Plan Comments: (Lab Results      Component                Value               Date                      WBC                      4.2                 06/03/2020                 HGB                      10.0 (L)            06/03/2020                HCT                      29.7 (L)            06/03/2020                MCV                      91.7                06/03/2020                PLT  128 (L)             06/03/2020           Lab Results      Component                Value               Date                      NA                       141                 06/02/2020                K                        4.3                 06/02/2020                CO2                      23                  06/02/2020                GLUCOSE                  94                  06/02/2020                BUN                      18                  06/02/2020                CREATININE               1.10                06/02/2020                CALCIUM                  9.1                 06/02/2020                GFRNONAA                 56 (L)              06/02/2020                GFRAA                    52 (L)              01/19/2020                 ECHO 11/21: 1. Left ventricular ejection fraction, by estimation, is 60 to 65%. The  left ventricle has normal function. The left ventricle has no regional  wall motion abnormalities. Left ventricular diastolic parameters are  consistent with Grade II  diastolic  dysfunction (pseudonormalization). Elevated left atrial pressure.  2. Right ventricular systolic function is normal. The right ventricular  size is normal. There is normal pulmonary artery systolic pressure. The  estimated right ventricular systolic pressure is 05.1 mmHg.  3. Left atrial size was mild to moderately dilated.  4. Right atrial size was mildly dilated.  5. The mitral valve is normal in structure. Trivial mitral valve  regurgitation. No evidence of mitral stenosis.  6. The aortic valve is normal in structure. Aortic valve regurgitation is  trivial. Mild aortic valve sclerosis is present, with no evidence of   aortic valve stenosis.  7. The inferior vena cava is normal in size with greater than 50%  respiratory variability, suggesting right atrial pressure of 3 mmHg. )       Anesthesia Quick Evaluation

## 2020-06-04 ENCOUNTER — Inpatient Hospital Stay (HOSPITAL_COMMUNITY): Payer: Medicare Other | Admitting: Anesthesiology

## 2020-06-04 ENCOUNTER — Inpatient Hospital Stay (HOSPITAL_COMMUNITY): Payer: Medicare Other

## 2020-06-04 ENCOUNTER — Encounter (HOSPITAL_COMMUNITY)
Admission: EM | Disposition: A | Discharge status: Skilled Nursing Facility | Payer: Self-pay | Source: Home / Self Care | Attending: Vascular Surgery

## 2020-06-04 ENCOUNTER — Encounter (HOSPITAL_COMMUNITY): Payer: Self-pay | Admitting: Vascular Surgery

## 2020-06-04 DIAGNOSIS — M79604 Pain in right leg: Secondary | ICD-10-CM

## 2020-06-04 HISTORY — PX: FASCIOTOMY: SHX132

## 2020-06-04 LAB — CBC
HCT: 25.7 % — ABNORMAL LOW (ref 39.0–52.0)
Hemoglobin: 8.4 g/dL — ABNORMAL LOW (ref 13.0–17.0)
MCH: 30.8 pg (ref 26.0–34.0)
MCHC: 32.7 g/dL (ref 30.0–36.0)
MCV: 94.1 fL (ref 80.0–100.0)
Platelets: 148 10*3/uL — ABNORMAL LOW (ref 150–400)
RBC: 2.73 MIL/uL — ABNORMAL LOW (ref 4.22–5.81)
RDW: 14.4 % (ref 11.5–15.5)
WBC: 8.9 10*3/uL (ref 4.0–10.5)
nRBC: 0 % (ref 0.0–0.2)

## 2020-06-04 LAB — GLUCOSE, CAPILLARY
Glucose-Capillary: 107 mg/dL — ABNORMAL HIGH (ref 70–99)
Glucose-Capillary: 98 mg/dL (ref 70–99)
Glucose-Capillary: 104 mg/dL — ABNORMAL HIGH (ref 70–99)
Glucose-Capillary: 101 mg/dL — ABNORMAL HIGH (ref 70–99)
Glucose-Capillary: 174 mg/dL — ABNORMAL HIGH (ref 70–99)

## 2020-06-04 LAB — HEPARIN LEVEL (UNFRACTIONATED)
Heparin Unfractionated: 0.25 IU/mL — ABNORMAL LOW (ref 0.30–0.70)
Heparin Unfractionated: <0.1 IU/mL — ABNORMAL LOW (ref 0.30–0.70)

## 2020-06-04 LAB — TYPE AND SCREEN
ABO/RH(D): A POS
Antibody Screen: NEGATIVE

## 2020-06-04 LAB — APTT: aPTT: 42 seconds — ABNORMAL HIGH (ref 24–36)

## 2020-06-04 SURGERY — FASCIOTOMY, UPPER EXTREMITY
Anesthesia: General | Site: Leg Lower | Laterality: Right

## 2020-06-04 MED ORDER — PROMETHAZINE HCL 25 MG/ML IJ SOLN: 6.2500 mg | INTRAMUSCULAR | Status: DC | PRN

## 2020-06-04 MED ORDER — CHLORHEXIDINE GLUCONATE 0.12 % MT SOLN
OROMUCOSAL | Status: AC
  Administered 2020-06-04: 10:00:00 15 mL via OROMUCOSAL
  Filled 2020-06-04: qty 15

## 2020-06-04 MED ORDER — ONDANSETRON HCL 4 MG/2ML IJ SOLN
INTRAMUSCULAR | Status: AC
  Filled 2020-06-04: qty 2

## 2020-06-04 MED ORDER — GLYCOPYRROLATE PF 0.2 MG/ML IJ SOSY
PREFILLED_SYRINGE | INTRAMUSCULAR | Status: AC
  Filled 2020-06-04: qty 1

## 2020-06-04 MED ORDER — DEXAMETHASONE SODIUM PHOSPHATE 10 MG/ML IJ SOLN
INTRAMUSCULAR | Status: AC
  Filled 2020-06-04: qty 1

## 2020-06-04 MED ORDER — CEFAZOLIN SODIUM-DEXTROSE 2-4 GM/100ML-% IV SOLN
2.0000 g | INTRAVENOUS | Status: AC
  Administered 2020-06-04: 10:00:00 2 g via INTRAVENOUS
  Filled 2020-06-04: qty 100

## 2020-06-04 MED ORDER — LIDOCAINE HCL (CARDIAC) PF 100 MG/5ML IV SOSY
PREFILLED_SYRINGE | INTRAVENOUS | Status: DC | PRN
  Administered 2020-06-04: 80 mg via INTRATRACHEAL

## 2020-06-04 MED ORDER — FENTANYL CITRATE (PF) 250 MCG/5ML IJ SOLN
INTRAMUSCULAR | Status: DC | PRN
  Administered 2020-06-04 (×2): 50 ug via INTRAVENOUS

## 2020-06-04 MED ORDER — PHENYLEPHRINE HCL (PRESSORS) 10 MG/ML IV SOLN
INTRAVENOUS | Status: DC | PRN
  Administered 2020-06-04: 120 ug via INTRAVENOUS
  Administered 2020-06-04: 80 ug via INTRAVENOUS

## 2020-06-04 MED ORDER — ACETAMINOPHEN 500 MG PO TABS
1000.0000 mg | ORAL_TABLET | Freq: Once | ORAL | Status: AC
  Administered 2020-06-04: 09:00:00 1000 mg via ORAL
  Filled 2020-06-04: qty 2

## 2020-06-04 MED ORDER — 0.9 % SODIUM CHLORIDE (POUR BTL) OPTIME
TOPICAL | Status: DC | PRN
  Administered 2020-06-04: 09:00:00 1000 mL

## 2020-06-04 MED ORDER — FENTANYL CITRATE (PF) 250 MCG/5ML IJ SOLN
INTRAMUSCULAR | Status: AC
  Filled 2020-06-04: qty 5

## 2020-06-04 MED ORDER — PHENYLEPHRINE 40 MCG/ML (10ML) SYRINGE FOR IV PUSH (FOR BLOOD PRESSURE SUPPORT)
PREFILLED_SYRINGE | INTRAVENOUS | Status: AC
  Filled 2020-06-04: qty 10

## 2020-06-04 MED ORDER — CELECOXIB 200 MG PO CAPS
200.0000 mg | ORAL_CAPSULE | Freq: Once | ORAL | Status: AC
  Administered 2020-06-04: 09:00:00 200 mg via ORAL
  Filled 2020-06-04 (×2): qty 1

## 2020-06-04 MED ORDER — FENTANYL CITRATE (PF) 100 MCG/2ML IJ SOLN: 25.0000 ug | INTRAMUSCULAR | Status: DC | PRN

## 2020-06-04 MED ORDER — EPHEDRINE 5 MG/ML INJ
INTRAVENOUS | Status: AC
  Filled 2020-06-04: qty 10

## 2020-06-04 MED ORDER — PROPOFOL 10 MG/ML IV BOLUS
INTRAVENOUS | Status: DC | PRN
  Administered 2020-06-04: 100 mg via INTRAVENOUS

## 2020-06-04 MED ORDER — PROPOFOL 10 MG/ML IV BOLUS
INTRAVENOUS | Status: AC
  Filled 2020-06-04: qty 20

## 2020-06-04 MED ORDER — CHLORHEXIDINE GLUCONATE 0.12 % MT SOLN
15.0000 mL | Freq: Once | OROMUCOSAL | Status: AC
  Filled 2020-06-04: qty 15

## 2020-06-04 MED ORDER — ONDANSETRON HCL 4 MG/2ML IJ SOLN
INTRAMUSCULAR | Status: DC | PRN
  Administered 2020-06-04: 4 mg via INTRAVENOUS

## 2020-06-04 MED ORDER — LACTATED RINGERS IV SOLN: INTRAVENOUS | Status: DC

## 2020-06-04 MED ORDER — GLYCOPYRROLATE 0.2 MG/ML IJ SOLN
INTRAMUSCULAR | Status: DC | PRN
  Administered 2020-06-04: .2 mg via INTRAVENOUS

## 2020-06-04 MED ORDER — DEXAMETHASONE SODIUM PHOSPHATE 10 MG/ML IJ SOLN
INTRAMUSCULAR | Status: DC | PRN
  Administered 2020-06-04: 8 mg via INTRAVENOUS

## 2020-06-04 MED ORDER — EPHEDRINE SULFATE 50 MG/ML IJ SOLN
INTRAMUSCULAR | Status: DC | PRN
  Administered 2020-06-04 (×2): 10 mg via INTRAVENOUS

## 2020-06-04 SURGICAL SUPPLY — 28 items
BAG ISOLATION DRAPE 18X18 (DRAPES) ×1 IMPLANT
BNDG ELASTIC 4X5.8 VLCR STR LF (GAUZE/BANDAGES/DRESSINGS) ×2 IMPLANT
BNDG ELASTIC 6X5.8 VLCR STR LF (GAUZE/BANDAGES/DRESSINGS) IMPLANT
BNDG GAUZE ELAST 4 BULKY (GAUZE/BANDAGES/DRESSINGS) ×4 IMPLANT
CANISTER SUCT 3000ML PPV (MISCELLANEOUS) ×2 IMPLANT
DRAPE ISOLATION BAG 18X18 (DRAPES) ×2
DRAPE ORTHO SPLIT 77X108 STRL (DRAPES) ×4
DRAPE SURG ORHT 6 SPLT 77X108 (DRAPES) ×2 IMPLANT
ELECT REM PT RETURN 9FT ADLT (ELECTROSURGICAL) ×2
ELECTRODE REM PT RTRN 9FT ADLT (ELECTROSURGICAL) ×1 IMPLANT
GAUZE SPONGE 4X4 12PLY STRL (GAUZE/BANDAGES/DRESSINGS) ×2 IMPLANT
GLOVE BIO SURGEON STRL SZ7.5 (GLOVE) ×2 IMPLANT
GOWN STRL REUS W/ TWL LRG LVL3 (GOWN DISPOSABLE) ×3 IMPLANT
GOWN STRL REUS W/TWL LRG LVL3 (GOWN DISPOSABLE) ×6
KIT BASIN OR (CUSTOM PROCEDURE TRAY) ×2 IMPLANT
KIT TURNOVER KIT B (KITS) ×2 IMPLANT
NS IRRIG 1000ML POUR BTL (IV SOLUTION) ×2 IMPLANT
PACK CV ACCESS (CUSTOM PROCEDURE TRAY) ×2 IMPLANT
PAD ARMBOARD 7.5X6 YLW CONV (MISCELLANEOUS) ×4 IMPLANT
STAPLER VISISTAT 35W (STAPLE) IMPLANT
SUT ETHILON 3 0 PS 1 (SUTURE) IMPLANT
SUT PROLENE 5 0 C 1 24 (SUTURE) ×2 IMPLANT
SUT VIC AB 2-0 CTX 36 (SUTURE) IMPLANT
SUT VIC AB 3-0 SH 27 (SUTURE)
SUT VIC AB 3-0 SH 27X BRD (SUTURE) IMPLANT
SUT VICRYL 4-0 PS2 18IN ABS (SUTURE) IMPLANT
TOWEL GREEN STERILE (TOWEL DISPOSABLE) ×2 IMPLANT
WATER STERILE IRR 1000ML POUR (IV SOLUTION) ×2 IMPLANT

## 2020-06-04 NOTE — Progress Notes (Addendum)
ANTICOAGULATION CONSULT NOTE  Pharmacy Consult for heparin Indication: Occluded right common femoral to below knee popliteal bypass graft  Allergies  Allergen Reactions  . Oxycodone Nausea And Vomiting  . Statins Other (See Comments)    Muscle and Bone pain   . Amiodarone     Caused issues and was stopped by MD- possible was the cause for the patient's heart "going in and out of rhythm" multiple times  . Donepezil Other (See Comments)    Per [facility]  . Imipramine Other (See Comments)    Per [facility]  . Oxybutynin Chloride Swelling and Other (See Comments)    Per [facility]    Patient Measurements: Height: 5\' 9"  (175.3 cm) Weight: 79.7 kg (175 lb 11.3 oz) IBW/kg (Calculated) : 70.7   Vital Signs: Temp: 97.6 F (36.4 C) (12/13 0756) Temp Source: Oral (12/13 0756) BP: 135/46 (12/13 0756) Pulse Rate: 61 (12/13 0756)  Labs: Recent Labs    06/02/20 0847 06/02/20 0959 06/02/20 1150 06/02/20 2230 06/03/20 0343 06/03/20 1014 06/03/20 1553 06/03/20 1638 06/04/20 0600  HGB 12.2* 11.9* 11.6*   < > 10.0* 9.7* 9.0*  --  8.4*  HCT 38.4* 35.0* 34.0*   < > 29.7* 29.4* 27.2*  --  25.7*  PLT 218  --   --    < > 128* 140* 146*  --  148*  APTT  --   --   --    < > 73* 61*  --   --  42*  HEPARINUNFRC  --   --   --    < > 0.84* 0.67  --  1.10* 0.25*  CREATININE 1.29* 1.20 1.10  --   --   --   --   --   --    < > = values in this interval not displayed.    Estimated Creatinine Clearance: 53.6 mL/min (by C-G formula based on SCr of 1.1 mg/dL).  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 On apixaban PTA for afib and last dose was 12/10  He is now s/p drug-coated balloon angioplasty 12/12 and heparin to restart 8 hours post sheath removal (removed ~ 3pm)  Heparin level came back subtherapeutic this AM at 0.25 and aPTT  42. Platelets remained low <150k.  Level is correlating now. We will use heparin level from now on. Possible back to the OR today. Hold off increase for now and f/u after OR.  Goal of Therapy:  Heparin level 0.3-0.7 units/ml aPTT 66-102 seconds Monitor platelets by anticoagulation protocol: Yes   Plan:   F/u resume post [place], PharmD, BCIDP, AAHIVP, CPP Infectious Disease Pharmacist 06/04/2020 8:36 AM  Addendum:  Apparently, heparin was never stopped. We will recheck another level this PM before adjusting.  Onnie Boer, PharmD, BCIDP, AAHIVP, CPP Infectious Disease Pharmacist 06/04/2020 3:16 PM

## 2020-06-04 NOTE — Progress Notes (Signed)
ANTICOAGULATION CONSULT NOTE  Pharmacy Consult for Heparin Indication:  Occluded right common femoral to below knee popliteal bypass graft  Allergies  Allergen Reactions  . Oxycodone Nausea And Vomiting  . Statins Other (See Comments)    Muscle and Bone pain   . Amiodarone     Caused issues and was stopped by MD- possible was the cause for the patient's heart "going in and out of rhythm" multiple times  . Donepezil Other (See Comments)    Per [facility]  . Imipramine Other (See Comments)    Per [facility]  . Oxybutynin Chloride Swelling and Other (See Comments)    Per [facility]    Patient Measurements: Height: 5\' 9"  (175.3 cm) Weight: 79.7 kg (175 lb 11.3 oz) IBW/kg (Calculated) : 70.7 Heparin Dosing Weight: 79.7  kg  Vital Signs: Temp: 98.1 F (36.7 C) (12/13 1525) Temp Source: Oral (12/13 1525) BP: 101/61 (12/13 1525) Pulse Rate: 68 (12/13 1525)  Labs: Recent Labs    06/02/20 0847 06/02/20 0959 06/02/20 1150 06/02/20 2230 06/03/20 0343 06/03/20 1014 06/03/20 1553 06/03/20 1638 06/04/20 0600 06/04/20 1530  HGB 12.2* 11.9* 11.6*   < > 10.0* 9.7* 9.0*  --  8.4*  --   HCT 38.4* 35.0* 34.0*   < > 29.7* 29.4* 27.2*  --  25.7*  --   PLT 218  --   --    < > 128* 140* 146*  --  148*  --   APTT  --   --   --    < > 73* 61*  --   --  42*  --   HEPARINUNFRC  --   --   --    < > 0.84* 0.67  --  1.10* 0.25* <0.10*  CREATININE 1.29* 1.20 1.10  --   --   --   --   --   --   --    < > = values in this interval not displayed.    Estimated Creatinine Clearance: 53.6 mL/min (by C-G formula based on SCr of 1.1 mg/dL).  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 On apixaban PTA for afib and last dose was 12/10  He is now s/p drug-coated balloon angioplasty 12/12 and heparin to restart 8 hours post sheath removal (removed ~ 3pm)  Heparin level came back  subtherapeutic this AM at 0.25 units/ml with aPTT at 42 sec. Platelets remain low at 148. As aPTT and heparin levels are correlating now, we will use heparin level to monitor anticoagulation at this point. Although heparin level and aPTT were subtherapeutic this AM, no changes were made in heparin infusion at 800 units/hr, in anticipation of procedure today.  Pt went to OR today for fasciotomy of R lateral anterior and superficial posterior compartments. Heparin infusion was not stopped for OR. Heparin level drawn this afternoon was <0.10 units/ml on 800 units/hr, which is below the goal range for this pt. Per RN, no issues with IV or bleeding observed.  Goal of Therapy:  Heparin level: 0.3-0.7 units/ml Monitor platelets by anticoagulation protocol: Yes   Plan:   Increase heparin infusion to 1050 units/hr Check heparin level in 8 hrs Monitor daily heparin level, CBC Monitor for signs/symtoms of bleeding  Gillermina Hu, PharmD, BCPS, [facility] Clinical Pharmacist 06/04/2020 5:12 PM

## 2020-06-04 NOTE — H&P (View-Only) (Signed)
  Progress Note    06/04/2020 7:39 AM 1 Day Post-Op  Subjective:  C/o of his right leg being sore, calf hurts when he wiggles his toes or moves his foot.  Afebrile HR 50's-60's SB 003'B-048'G systolic 89% [id number]  Gtts:  heparin  Vitals:   06/03/20 2329 06/04/20 0600  BP: (!) 127/46 (!) 146/46  Pulse: 60 66  Resp: 13 17  Temp: 98.2 F (36.8 C) 98.1 F (36.7 C)  SpO2: 96% 94%    Physical Exam: Cardiac:  regular Lungs:  Non labored Extremities:  Brisk right PT doppler signal; monophasic right AT/peroneal; right calf tender to palpation. Anterior compartment is non tender.  Thigh is not tender to palpation. Motor/sensation in tact RLE.  CBC    Component Value Date/Time   WBC 11.9 (H) 06/03/2020 1553   RBC 2.90 (L) 06/03/2020 1553   HGB 9.0 (L) 06/03/2020 1553   HCT 27.2 (L) 06/03/2020 1553   PLT 146 (L) 06/03/2020 1553   MCV 93.8 06/03/2020 1553   MCH 31.0 06/03/2020 1553   MCHC 33.1 06/03/2020 1553   RDW 13.9 06/03/2020 1553   LYMPHSABS 1.1 06/02/2020 0847   MONOABS 0.9 06/02/2020 0847   EOSABS 0.1 06/02/2020 0847   BASOSABS 0.0 06/02/2020 0847    BMET    Component Value Date/Time   NA 141 06/02/2020 1150   K 4.3 06/02/2020 1150   CL 105 06/02/2020 1150   CO2 23 06/02/2020 0847   GLUCOSE 94 06/02/2020 1150   BUN 18 06/02/2020 1150   CREATININE 1.10 06/02/2020 1150   CALCIUM 9.1 06/02/2020 0847   GFRNONAA 56 (L) 06/02/2020 0847   GFRAA 52 (L) 01/19/2020 0547    INR    Component Value Date/Time   INR 1.1 01/18/2020 0827     Intake/Output Summary (Last 24 hours) at 06/04/2020 0739 Last data filed at 06/03/2020 2100 Gross per 24 hour  Intake 2156.36 ml  Output 425 ml  Net 1731.36 ml     Assessment:  79 y.o. male is s/p:  Right lower extremity angiography with third order cannulation Drug-coated balloon angioplasty of tibioperoneal trunk (4x71mm Ranger)  1 Day Post-Op  Plan: -pt with brisk right PT doppler signal and monophasic right  AT/peroneal.  Calf is tender to palpation. Discussed with Dr. Stanford Breed who is coming to evaluate pt.  Discussed with pt to not eat until Dr. Stanford Breed has seen him. -DVT prophylaxis:  Heparin gtt   Leontine Locket, PA-C [facility] [phone] 06/04/2020 7:39 AM

## 2020-06-04 NOTE — Op Note (Signed)
Procedure: Fasciotomy of right lateral anterior and superficial posterior compartments  Preoperative diagnosis: Right leg pain  Postoperative diagnosis: Same  Anesthesia: General  Assistant: Risa Grill, PA-C  Operative findings: Pale appearing but contractile muscle tissue right leg  Operative details: After obtaining form consent, the patient taken the operating.  The patient was placed in supine position operating table.  After induction general anesthesia patient's right lower extremities prepped and draped in usual sterile fashion.  Longitudinal incision was made over the lateral aspect of the right leg.  Incision was carried on through subcutaneous tissues and the anterior and lateral compartments were decompressed through the fascia.  The muscle was pale in appearance but viable and pink and contractile.  I then proceeded to make a longitudinal incision over the medial aspect the leg and the superficial posterior compartment was opened and I took down a fair amount of the soleus muscle all the muscle was inspected found to be viable again pale in appearance but contractile.  1 small area of bleeding was encountered from a posterior tibial vein and this was repaired with a single 5-0 Prolene suture.  The wounds were both moistened saline Kerlix.  Dry sterile dressing was applied.  Patient taught procedure well and there were no complications.  Instrument sponge and needle count was correct the end of the case.  Patient was taken to the recovery room stable condition.  Ruta Hinds, MD [facility] [place] Office: [phone]

## 2020-06-04 NOTE — Progress Notes (Signed)
  Progress Note    06/04/2020 7:39 AM 1 Day Post-Op  Subjective:  C/o of his right leg being sore, calf hurts when he wiggles his toes or moves his foot.  Afebrile HR 50's-60's SB 101'B-510'C systolic 58% [id number]  Gtts:  heparin  Vitals:   06/03/20 2329 06/04/20 0600  BP: (!) 127/46 (!) 146/46  Pulse: 60 66  Resp: 13 17  Temp: 98.2 F (36.8 C) 98.1 F (36.7 C)  SpO2: 96% 94%    Physical Exam: Cardiac:  regular Lungs:  Non labored Extremities:  Brisk right PT doppler signal; monophasic right AT/peroneal; right calf tender to palpation. Anterior compartment is non tender.  Thigh is not tender to palpation. Motor/sensation in tact RLE.  CBC    Component Value Date/Time   WBC 11.9 (H) 06/03/2020 1553   RBC 2.90 (L) 06/03/2020 1553   HGB 9.0 (L) 06/03/2020 1553   HCT 27.2 (L) 06/03/2020 1553   PLT 146 (L) 06/03/2020 1553   MCV 93.8 06/03/2020 1553   MCH 31.0 06/03/2020 1553   MCHC 33.1 06/03/2020 1553   RDW 13.9 06/03/2020 1553   LYMPHSABS 1.1 06/02/2020 0847   MONOABS 0.9 06/02/2020 0847   EOSABS 0.1 06/02/2020 0847   BASOSABS 0.0 06/02/2020 0847    BMET    Component Value Date/Time   NA 141 06/02/2020 1150   K 4.3 06/02/2020 1150   CL 105 06/02/2020 1150   CO2 23 06/02/2020 0847   GLUCOSE 94 06/02/2020 1150   BUN 18 06/02/2020 1150   CREATININE 1.10 06/02/2020 1150   CALCIUM 9.1 06/02/2020 0847   GFRNONAA 56 (L) 06/02/2020 0847   GFRAA 52 (L) 01/19/2020 0547    INR    Component Value Date/Time   INR 1.1 01/18/2020 0827     Intake/Output Summary (Last 24 hours) at 06/04/2020 0739 Last data filed at 06/03/2020 2100 Gross per 24 hour  Intake 2156.36 ml  Output 425 ml  Net 1731.36 ml     Assessment:  79 y.o. male is s/p:  Right lower extremity angiography with third order cannulation Drug-coated balloon angioplasty of tibioperoneal trunk (4x2mm Ranger)  1 Day Post-Op  Plan: -pt with brisk right PT doppler signal and monophasic right  AT/peroneal.  Calf is tender to palpation. Discussed with Dr. Stanford Breed who is coming to evaluate pt.  Discussed with pt to not eat until Dr. Stanford Breed has seen him. -DVT prophylaxis:  Heparin gtt   Leontine Locket, PA-C [facility] [phone] 06/04/2020 7:39 AM

## 2020-06-04 NOTE — Anesthesia Procedure Notes (Signed)
Procedure Name: LMA Insertion Date/Time: 06/04/2020 10:20 AM Performed by: Kathryne Hitch, CRNA Pre-anesthesia Checklist: Patient identified, Emergency Drugs available, Suction available and Patient being monitored Patient Re-evaluated:Patient Re-evaluated prior to induction Oxygen Delivery Method: Circle system utilized Preoxygenation: Pre-oxygenation with 100% oxygen Induction Type: IV induction Ventilation: Mask ventilation without difficulty LMA: LMA inserted LMA Size: 5.0 Number of attempts: 1 Placement Confirmation: positive ETCO2 and breath sounds checked- equal and bilateral Tube secured with: Tape Dental Injury: Teeth and Oropharynx as per pre-operative assessment

## 2020-06-04 NOTE — Interval H&P Note (Signed)
History and Physical Interval Note:  06/04/2020 9:20 AM  Donald Gordon  has presented today for surgery, with the diagnosis of LIMB ISCHEMIA.  The various methods of treatment have been discussed with the patient and family. After consideration of risks, benefits and other options for treatment, the patient has consented to  Procedure(s): LOWER EXTREMITY FASCIOTOMY (Right) as a surgical intervention.  The patient's history has been reviewed, patient examined, no change in status, stable for surgery.  I have reviewed the patient's chart and labs.  Questions were answered to the patient's satisfaction.     Ruta Hinds

## 2020-06-04 NOTE — Transfer of Care (Signed)
Immediate Anesthesia Transfer of Care Note  Patient: EDMAR BLANKENBURG  Procedure(s) Performed: RIGHT LOWER EXTREMITY FASCIOTOMY (Right Leg Lower)  Patient Location: PACU  Anesthesia Type:General  Level of Consciousness: drowsy and patient cooperative  Airway & Oxygen Therapy: Patient Spontanous Breathing and Patient connected to face mask oxygen  Post-op Assessment: Report given to RN and Post -op Vital signs reviewed and stable  Post vital signs: Reviewed and stable  Last Vitals:  Vitals Value Taken Time  BP    Temp    Pulse    Resp    SpO2      Last Pain:  Vitals:   06/04/20 0830  TempSrc:   PainSc: 5       Patients Stated Pain Goal: 3 (24/26/83 4196)  Complications: No complications documented.

## 2020-06-04 NOTE — Progress Notes (Signed)
Pt received from PACU. ACE bandage on right leg-- clean, dry and intact. Oriented x 4. VSS. Call bell in reach. Will continue to monitor.  Arletta Bale, RN

## 2020-06-04 NOTE — Anesthesia Preprocedure Evaluation (Addendum)
Anesthesia Evaluation  Patient identified by MRN, date of birth, ID band Patient awake    Reviewed: Allergy & Precautions, NPO status , Patient's Chart, lab work & pertinent test results  History of Anesthesia Complications Negative for: history of anesthetic complications  Airway Mallampati: I  TM Distance: >3 FB Neck ROM: Full    Dental  (+) Upper Dentures, Lower Dentures, Dental Advisory Given   Pulmonary sleep apnea and Continuous Positive Airway Pressure Ventilation , COPD, former smoker,    Pulmonary exam normal        Cardiovascular hypertension, Pt. on medications and Pt. on home beta blockers + CAD, + Past MI, + Peripheral Vascular Disease and +CHF  + dysrhythmias Atrial Fibrillation  Rhythm:Irregular Rate:Normal  ECHO 11/21: 1. Left ventricular ejection fraction, by estimation, is 60 to 65%. The  left ventricle has normal function. The left ventricle has no regional  wall motion abnormalities. Left ventricular diastolic parameters are  consistent with Grade II diastolic  dysfunction (pseudonormalization). Elevated left atrial pressure.  2. Right ventricular systolic function is normal. The right ventricular  size is normal. There is normal pulmonary artery systolic pressure. The  estimated right ventricular systolic pressure is 75.1 mmHg.  3. Left atrial size was mild to moderately dilated.  4. Right atrial size was mildly dilated.  5. The mitral valve is normal in structure. Trivial mitral valve  regurgitation. No evidence of mitral stenosis.  6. The aortic valve is normal in structure. Aortic valve regurgitation is  trivial. Mild aortic valve sclerosis is present, with no evidence of  aortic valve stenosis.  7. The inferior vena cava is normal in size with greater than 50%  respiratory variability, suggesting right atrial pressure of 3 mmHg.    Neuro/Psych CVA negative psych ROS   GI/Hepatic Neg liver  ROS, GERD  Medicated,  Endo/Other  diabetes  Renal/GU Renal disease     Musculoskeletal  (+) Arthritis ,   Abdominal   Peds  Hematology negative hematology ROS (+)   Anesthesia Other Findings   Reproductive/Obstetrics                            Anesthesia Physical  Anesthesia Plan  ASA: III  Anesthesia Plan: General   Post-op Pain Management:    Induction: Intravenous  PONV Risk Score and Plan: 3  Airway Management Planned: LMA  Additional Equipment:   Intra-op Plan:   Post-operative Plan: Extubation in OR  Informed Consent: I have reviewed the patients History and Physical, chart, labs and discussed the procedure including the risks, benefits and alternatives for the proposed anesthesia with the patient or authorized representative who has indicated his/her understanding and acceptance.     Dental advisory given  Plan Discussed with: Anesthesiologist, CRNA and Surgeon  Anesthesia Plan Comments:        Anesthesia Quick Evaluation

## 2020-06-04 NOTE — Anesthesia Postprocedure Evaluation (Signed)
Anesthesia Post Note  Patient: Donald Gordon  Procedure(s) Performed: RIGHT LOWER EXTREMITY FASCIOTOMY (Right Leg Lower)     Patient location during evaluation: PACU Anesthesia Type: General Level of consciousness: sedated Pain management: pain level controlled Vital Signs Assessment: post-procedure vital signs reviewed and stable Respiratory status: spontaneous breathing and respiratory function stable Cardiovascular status: stable Postop Assessment: no apparent nausea or vomiting Anesthetic complications: no   No complications documented.  Last Vitals:  Vitals:   06/04/20 1045 06/04/20 1130  BP: (!) 131/50   Pulse: 77   Resp:    Temp: 36.6 C 36.4 C  SpO2: 100%     Last Pain:  Vitals:   06/04/20 1130  TempSrc:   PainSc: 0-No pain                 Kalli Greenfield DANIEL

## 2020-06-05 ENCOUNTER — Encounter (HOSPITAL_COMMUNITY): Payer: Self-pay | Admitting: Vascular Surgery

## 2020-06-05 LAB — CBC
HCT: 24 % — ABNORMAL LOW (ref 39.0–52.0)
Hemoglobin: 7.7 g/dL — ABNORMAL LOW (ref 13.0–17.0)
MCH: 30.1 pg (ref 26.0–34.0)
MCHC: 32.1 g/dL (ref 30.0–36.0)
MCV: 93.8 fL (ref 80.0–100.0)
Platelets: 104 10*3/uL — ABNORMAL LOW (ref 150–400)
RBC: 2.56 MIL/uL — ABNORMAL LOW (ref 4.22–5.81)
RDW: 14.1 % (ref 11.5–15.5)
WBC: 5.3 10*3/uL (ref 4.0–10.5)
nRBC: 0 % (ref 0.0–0.2)

## 2020-06-05 LAB — GLUCOSE, CAPILLARY
Glucose-Capillary: 127 mg/dL — ABNORMAL HIGH (ref 70–99)
Glucose-Capillary: 131 mg/dL — ABNORMAL HIGH (ref 70–99)

## 2020-06-05 LAB — HEPARIN LEVEL (UNFRACTIONATED): Heparin Unfractionated: 0.35 IU/mL (ref 0.30–0.70)

## 2020-06-05 MED ORDER — APIXABAN 2.5 MG PO TABS: 2.5000 mg | ORAL_TABLET | Freq: Two times a day (BID) | ORAL | Status: DC

## 2020-06-05 MED ORDER — APIXABAN 5 MG PO TABS
5.0000 mg | ORAL_TABLET | Freq: Two times a day (BID) | ORAL | Status: DC
Start: 2020-06-05 — End: 2020-06-12
  Administered 2020-06-05 – 2020-06-12 (×15): 5 mg via ORAL
  Filled 2020-06-05 (×15): qty 1

## 2020-06-05 NOTE — Progress Notes (Signed)
Dressing change:  left calf is soft.  + PT>AT>peroneal Doppler signals Saline W>D dressing applied Muscle beds healthy  Right lateral    Right medial    Consult wound care for VAC dressing to left lower leg fasciotomy sites Also will increase Eliquis from 2.5 to 5mg  BID as serum creatinine improved (1.1) Consider discontinuing aspirin and continuing Plavix

## 2020-06-05 NOTE — Progress Notes (Signed)
ANTICOAGULATION CONSULT NOTE  Pharmacy Consult for Heparin Indication:  Occluded right common femoral to below knee popliteal bypass graft  Recent Labs    06/02/20 0847 06/02/20 0959 06/02/20 1150 06/02/20 2230 06/03/20 0343 06/03/20 1014 06/03/20 1553 06/03/20 1638 06/04/20 0600 06/04/20 1530 06/05/20 0130  HGB 12.2* 11.9* 11.6*   < > 10.0* 9.7* 9.0*  --  8.4*  --   --   HCT 38.4* 35.0* 34.0*   < > 29.7* 29.4* 27.2*  --  25.7*  --   --   PLT 218  --   --    < > 128* 140* 146*  --  148*  --   --   APTT  --   --   --    < > 73* 61*  --   --  42*  --   --   HEPARINUNFRC  --   --   --    < > 0.84* 0.67  --    < > 0.25* <0.10* 0.35  CREATININE 1.29* 1.20 1.10  --   --   --   --   --   --   --   --    < > = values in this interval not displayed.   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 On apixaban PTA for afib and last dose was 12/10  s/p drug-coated balloon angioplasty s/p fasciotomy of R lateral anterior and superficial posterior compartments.   Heparin level 0.35   Goal of Therapy:  Heparin level: 0.3-0.7 units/ml Monitor platelets by anticoagulation protocol: Yes   Plan:   Continue heparin infusion at 1050 units/hr Check heparin level in 8 hrs to confirm Monitor daily heparin level, CBC Monitor for signs/symtoms of bleeding  Thanks for allowing pharmacy to be a part of this patient's care.  Excell Seltzer, PharmD Clinical Pharmacist 06/05/2020 2:04 AM

## 2020-06-05 NOTE — Progress Notes (Signed)
A line removed per order. Pressure held. Pt tolerated well. VSS. Call bell in reach. Will continue to monitor.  Arletta Bale, RN

## 2020-06-05 NOTE — Progress Notes (Addendum)
Progress Note    06/05/2020 7:31 AM 1 Day Post-Op  Subjective:  Patient getting OOB to [employer]. Says left foot still sore.   Vitals:   06/04/20 2331 06/05/20 0349  BP: (!) 117/45 (!) 142/49  Pulse: 60 61  Resp: 17 18  Temp: 98.2 F (36.8 C) (!) 97.2 F (36.2 C)  SpO2: 95% 93%    Physical Exam: Cardiac:  RRR Lungs:  nonlabored Extremities:  Clean dry dressing to right foot   CBC    Component Value Date/Time   WBC 5.3 06/05/2020 0112   RBC 2.56 (L) 06/05/2020 0112   HGB 7.7 (L) 06/05/2020 0112   HCT 24.0 (L) 06/05/2020 0112   PLT 104 (L) 06/05/2020 0112   MCV 93.8 06/05/2020 0112   MCH 30.1 06/05/2020 0112   MCHC 32.1 06/05/2020 0112   RDW 14.1 06/05/2020 0112   LYMPHSABS 1.1 06/02/2020 0847   MONOABS 0.9 06/02/2020 0847   EOSABS 0.1 06/02/2020 0847   BASOSABS 0.0 06/02/2020 0847    BMET    Component Value Date/Time   NA 141 06/02/2020 1150   K 4.3 06/02/2020 1150   CL 105 06/02/2020 1150   CO2 23 06/02/2020 0847   GLUCOSE 94 06/02/2020 1150   BUN 18 06/02/2020 1150   CREATININE 1.10 06/02/2020 1150   CALCIUM 9.1 06/02/2020 0847   GFRNONAA 56 (L) 06/02/2020 0847   GFRAA 52 (L) 01/19/2020 0547     Intake/Output Summary (Last 24 hours) at 06/05/2020 0731 Last data filed at 06/05/2020 0350 Gross per 24 hour  Intake 1856.74 ml  Output 700 ml  Net 1156.74 ml    HOSPITAL MEDICATIONS Scheduled Meds: . aspirin EC  81 mg Oral Daily  . atorvastatin  40 mg Oral q1800  . Chlorhexidine Gluconate Cloth  6 each Topical Daily  . clopidogrel  75 mg Oral Q breakfast  . ezetimibe  10 mg Oral QPM  . finasteride  5 mg Oral Daily  . hydrochlorothiazide  12.5 mg Oral Daily  . insulin aspart  0-5 Units Subcutaneous QHS  . insulin aspart  0-6 Units Subcutaneous TID WC  . memantine  10 mg Oral q AM  . metoprolol tartrate  25 mg Oral Once  . metoprolol tartrate  25 mg Oral BID  . pantoprazole  40 mg Oral Daily  . sodium chloride flush  3 mL Intravenous Q12H    Continuous Infusions: . sodium chloride 100 mL/hr at 06/04/20 0858  . sodium chloride    . sodium chloride    . heparin 1,050 Units/hr (06/04/20 2204)  . 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  POD 2 On heparin for afib (Eliquis at home) s/p drug-coated balloon angioplasty s/p fasciotomy of R lateral anterior and superficial posterior compartments POD 1    Plan: -DC art line and heparin. Start Eliquis. Will return to do dressing change and likely order VAC. -DVT prophylaxis:  Hep gtt   Risa Grill, PA-C [facility] [phone] 06/05/2020  7:31 AM   VASCULAR STAFF ADDENDUM: I have independently interviewed and examined the patient. I agree with the above.  Doing well s/p thrombolysis and fasciotomy. Pain in calf perhaps a bit better. Foot warm and well perfused. Needs to mobilize:  PT/OT/OOB/Ambulate Stop heparin. Resume Eliquis. VAC to fasciotomy wounds.  [name]. Stanford Breed, MD [facility] Phone Number: [phone] 06/05/2020 8:16 AM

## 2020-06-06 LAB — GLUCOSE, CAPILLARY
Glucose-Capillary: 104 mg/dL — ABNORMAL HIGH (ref 70–99)
Glucose-Capillary: 173 mg/dL — ABNORMAL HIGH (ref 70–99)
Glucose-Capillary: 78 mg/dL (ref 70–99)
Glucose-Capillary: 126 mg/dL — ABNORMAL HIGH (ref 70–99)

## 2020-06-06 LAB — CBC
HCT: 24.1 % — ABNORMAL LOW (ref 39.0–52.0)
Hemoglobin: 8 g/dL — ABNORMAL LOW (ref 13.0–17.0)
MCH: 30.8 pg (ref 26.0–34.0)
MCHC: 33.2 g/dL (ref 30.0–36.0)
MCV: 92.7 fL (ref 80.0–100.0)
Platelets: 124 10*3/uL — ABNORMAL LOW (ref 150–400)
RBC: 2.6 MIL/uL — ABNORMAL LOW (ref 4.22–5.81)
RDW: 14.3 % (ref 11.5–15.5)
WBC: 8.4 10*3/uL (ref 4.0–10.5)
nRBC: 0 % (ref 0.0–0.2)

## 2020-06-06 MED ORDER — ENSURE ENLIVE PO LIQD
237.0000 mL | Freq: Two times a day (BID) | ORAL | Status: DC
  Administered 2020-06-06 – 2020-06-12 (×8): 237 mL via ORAL

## 2020-06-06 MED ORDER — LIDOCAINE-EPINEPHRINE 1 %-1:100000 IJ SOLN
30.0000 mL | Freq: Once | INTRAMUSCULAR | Status: DC
  Filled 2020-06-06: qty 30

## 2020-06-06 MED ORDER — MORPHINE SULFATE (PF) 2 MG/ML IV SOLN
2.0000 mg | INTRAVENOUS | Status: DC | PRN
  Administered 2020-06-07 – 2020-06-08 (×3): 2 mg via INTRAVENOUS
  Filled 2020-06-06 (×4): qty 1

## 2020-06-06 MED ORDER — LIDOCAINE-EPINEPHRINE 1 %-1:100000 IJ SOLN
20.0000 mL | Freq: Once | INTRAMUSCULAR | Status: DC
  Filled 2020-06-06: qty 20

## 2020-06-06 MED ORDER — JUVEN PO PACK
1.0000 | PACK | Freq: Two times a day (BID) | ORAL | Status: DC
  Administered 2020-06-06 – 2020-06-12 (×4): 1 via ORAL
  Filled 2020-06-06 (×4): qty 1

## 2020-06-06 NOTE — Progress Notes (Addendum)
  Progress Note    06/06/2020 8:20 AM 2 Days Post-Op  Subjective:  No complaints   Vitals:   06/05/20 2339 06/06/20 0351  BP: (!) 164/60 (!) 129/53  Pulse: 74 64  Resp: 16 18  Temp: 97.6 F (36.4 C) 98 F (36.7 C)  SpO2: 92% 95%   Physical Exam: Lungs:  Non labored Incisions:  R fasciotomy incisions c/d/i Extremities:  R DP and PT brisk by doppler Neurologic: A&O  CBC    Component Value Date/Time   WBC 8.4 06/06/2020 0059   RBC 2.60 (L) 06/06/2020 0059   HGB 8.0 (L) 06/06/2020 0059   HCT 24.1 (L) 06/06/2020 0059   PLT 124 (L) 06/06/2020 0059   MCV 92.7 06/06/2020 0059   MCH 30.8 06/06/2020 0059   MCHC 33.2 06/06/2020 0059   RDW 14.3 06/06/2020 0059   LYMPHSABS 1.1 06/02/2020 0847   MONOABS 0.9 06/02/2020 0847   EOSABS 0.1 06/02/2020 0847   BASOSABS 0.0 06/02/2020 0847    BMET    Component Value Date/Time   NA 141 06/02/2020 1150   K 4.3 06/02/2020 1150   CL 105 06/02/2020 1150   CO2 23 06/02/2020 0847   GLUCOSE 94 06/02/2020 1150   BUN 18 06/02/2020 1150   CREATININE 1.10 06/02/2020 1150   CALCIUM 9.1 06/02/2020 0847   GFRNONAA 56 (L) 06/02/2020 0847   GFRAA 52 (L) 01/19/2020 0547    INR    Component Value Date/Time   INR 1.1 01/18/2020 0827     Intake/Output Summary (Last 24 hours) at 06/06/2020 0820 Last data filed at 06/05/2020 1300 Gross per 24 hour  Intake --  Output 50 ml  Net -50 ml     Assessment/Plan:  79 y.o. male is s/p RLE bypass thrombolysis with subsequent fasciotomies 2 Days Post-Op   RLE well perfused Incisions of RLE will be closed at the bedside later this morning [facility] [place] when mobility improved and pain controlled   Dagoberto Ligas, PA-C [facility] [phone] 06/06/2020 8:20 AM  VASCULAR STAFF ADDENDUM: I have independently interviewed and examined the patient. I agree with the above.  Fasciotomy closure at bedside today. PT / OT / OOB / Ambulate May need SNF pending  therapist rec's  Etienne Mowers. Stanford Breed, MD [facility] Phone Number: [phone] 06/06/2020 1:16 PM

## 2020-06-06 NOTE — Progress Notes (Signed)
Initial Nutrition Assessment  DOCUMENTATION CODES:   Not applicable  INTERVENTION:    Ensure Enlive po BID, each supplement provides 350 kcal and 20 grams of protein  Juven Fruit Punch BID, each serving provides 95kcal and 2.5g of protein (amino acids glutamine and arginine)  NUTRITION DIAGNOSIS:   Increased nutrient needs related to post-op healing as evidenced by estimated needs.  GOAL:   Patient will meet greater than or equal to 90% of their needs  MONITOR:   PO intake,Supplement acceptance,Weight trends,Labs,I & O's  REASON FOR ASSESSMENT:   Malnutrition Screening Tool    ASSESSMENT:   Patient with PMH significant for CHF, PAD (s/p R femoral to knee popliteal bypass), CAD, GAVE, COPD, GERD, and HLD. Presents this admission with acute on chronic R ischemic leg.   12/11- angiography, RLE bypass thrombolysis 12/12- balloon angioplasty 12/13- R anterior/posterior fasciotomies   Pt reports loss in appetite two days PTA. States during this time he was able to consume bites and sips. Prior to this he consumed two meal daily that consisted of B- cereal with milk or ham sandwich D- meat with two vegetable options. Does not use supplementation. Discussed the importance of protein intake to help promote would healing. Pt willing to try Ensure.   Pt endorses a UBW of 180 lb and a recent wt loss of 15 lb. Pt is unsure if this is dry weight vs fluid fluctuation given history of CHF. Records show decline from 180 lb on 9/7 to 175 lb this admission. Unable to complete NFPE as pt had to use restroom.   Medications: SS novolog Labs: CBG 101-174  Diet Order:   Diet Order            Diet Heart Room service appropriate? Yes; Fluid consistency: Thin  Diet effective now                 EDUCATION NEEDS:   Education needs have been addressed  Skin:  Skin Assessment: Skin Integrity Issues: Skin Integrity Issues:: Incisions Incisions: groin x2, leg x2  Last BM:   12/15  Height:   Ht Readings from Last 1 Encounters:  06/02/20 5\' 9"  (1.753 m)    Weight:   Wt Readings from Last 1 Encounters:  06/02/20 79.7 kg    BMI:  Body mass index is 25.95 kg/m.  Estimated Nutritional Needs:   Kcal:  2200-2400 kcal  Protein:  110-130 grams  Fluid:  >/= 2 L/day  Mariana Single RD, LDN Clinical Nutrition Pager listed in [place]

## 2020-06-07 LAB — CBC
HCT: 24.9 % — ABNORMAL LOW (ref 39.0–52.0)
Hemoglobin: 8.4 g/dL — ABNORMAL LOW (ref 13.0–17.0)
MCH: 31.2 pg (ref 26.0–34.0)
MCHC: 33.7 g/dL (ref 30.0–36.0)
MCV: 92.6 fL (ref 80.0–100.0)
Platelets: 145 10*3/uL — ABNORMAL LOW (ref 150–400)
RBC: 2.69 MIL/uL — ABNORMAL LOW (ref 4.22–5.81)
RDW: 14.6 % (ref 11.5–15.5)
WBC: 8.7 10*3/uL (ref 4.0–10.5)
nRBC: 0 % (ref 0.0–0.2)

## 2020-06-07 LAB — GLUCOSE, CAPILLARY
Glucose-Capillary: 109 mg/dL — ABNORMAL HIGH (ref 70–99)
Glucose-Capillary: 98 mg/dL (ref 70–99)
Glucose-Capillary: 98 mg/dL (ref 70–99)
Glucose-Capillary: 102 mg/dL — ABNORMAL HIGH (ref 70–99)

## 2020-06-07 NOTE — Evaluation (Signed)
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 s/p 12/11 R LE angiography and initiation of intra-arterial thrombolysis. s/p 12/12 R LE angiography with drug-coated ballone angioplasty of tibioperoneal trunk.s/p 12/13 fasciotomy of R lateral anterior and superficial posterior compartments.  Clinical Impression  PTA pt living alone in mobile home with 3 steps to enter. Pt reports he had slowed down since COVID, but is still independent with ambulation, ADLs and iADLs. Pt is limited in safe mobility by increased R LE pain with decreased strength and endurance. Pt is supervision for bed mobility, min guard for transfers and min A for ambulation with RW. PT recommending short term SNF stay to improve mobility prior to going home. PT will continue to follow acutely.    Follow Up Recommendations SNF    Equipment Recommendations  Other (comment) (has required equipment)       Precautions / Restrictions Precautions Precautions: Fall Restrictions Weight Bearing Restrictions: Yes RLE Weight Bearing: Weight bearing as tolerated      Mobility  Bed Mobility Overal bed mobility: Needs Assistance Bed Mobility: Supine to Sit     Supine to sit: Supervision     General bed mobility comments: supervision for safety, immediate increase in R LE pain due to dependent position     Transfers Overall transfer level: Needs assistance   Transfers: Sit to/from Stand Sit to Stand: Min guard         General transfer comment: min guard for safety, vc for hand placement for power up  Ambulation/Gait Ambulation/Gait assistance: Min assist Gait Distance (Feet): 20 Feet Assistive device: Rolling walker (2 wheeled) Gait Pattern/deviations: Step-to pattern;Decreased step length - right;Decreased step length - left;Antalgic;Trunk flexed Gait velocity: slowed Gait velocity interpretation: <1.31 ft/sec, indicative of household ambulator General Gait Details: minA for steadying, increasing pain limited distance, vc for proximity to RW to maximize UE assist      Balance Overall balance assessment: Needs assistance Sitting-balance support: No upper extremity supported Sitting balance-Leahy Scale: Good     Standing balance support: Single extremity supported Standing balance-Leahy Scale: Fair Standing balance comment: able to static stand to use urinal                             Pertinent Vitals/Pain Pain Assessment: 0-10 Pain Score: 10-Worst pain ever Pain Location: R facisotomy site Pain Descriptors / Indicators: Throbbing;Grimacing;Guarding Pain Intervention(s): Limited activity within patient's tolerance;Monitored during session;Repositioned    Home Living Family/patient expects to be discharged to:: Private residence Living Arrangements: Alone Available Help at Discharge: Family;Friend(s);Available PRN/intermittently Type of Home: House Home Access: Stairs to enter Entrance Stairs-Rails: Psychiatric nurse of Steps: 2 Home Layout: One level Home Equipment: Walker - 2 wheels;Cane - single point Additional Comments: Pt reports he has very limited assistance from friends    Prior Function Level of Independence: Independent         Comments: ADLs, iADLs, driving, feeds cats and dogs  Hand Dominance   Dominant Hand:  Right    Extremity/Trunk Assessment   Upper Extremity Assessment Upper Extremity Assessment: Overall WFL for tasks assessed    Lower Extremity Assessment Lower Extremity Assessment: RLE deficits/detail;LLE deficits/detail RLE Deficits / Details: R calf wrapped, in clean, dry bandage RLE: Unable to fully assess due to pain LLE Deficits / Details: ROM WFL strength grossly 3+/5       Communication   Communication: No difficulties  Cognition Arousal/Alertness: Awake/alert Behavior During Therapy: WFL for tasks assessed/performed Overall Cognitive Status: Within Functional Limits for tasks assessed                                        General Comments General comments (skin integrity, edema, etc.): VSS on RA        Assessment/Plan    PT Assessment Patient needs continued PT services  PT Problem List Decreased activity tolerance;Decreased strength;Decreased balance;Decreased mobility;Decreased skin integrity;Pain       PT Treatment Interventions DME instruction;Gait training;Stair training;Functional mobility training;Therapeutic activities;Therapeutic exercise;Balance training;Cognitive remediation;Patient/family education    PT Goals (Current goals can be found in the Care Plan section)  Acute Rehab PT Goals Patient Stated Goal: have less pain PT Goal Formulation: With patient Time For Goal Achievement: 06/21/20 Potential to Achieve Goals: Fair    Frequency Min 2X/week    AM-PAC PT "6 Clicks" Mobility  Outcome Measure Help needed turning from your back to your side while in a flat bed without using bedrails?: None Help needed moving from lying on your back to sitting on the side of a flat bed without using bedrails?: None Help needed moving to and from a bed to a chair (including a wheelchair)?: A Little Help needed standing up from a chair using your arms (e.g., wheelchair or bedside chair)?: A Little Help needed to walk in hospital room?: A  Little Help needed climbing 3-5 steps with a railing? : A Lot 6 Click Score: 19    End of Session Equipment Utilized During Treatment: Gait belt Activity Tolerance: Patient limited by pain Patient left: in chair;with call bell/phone within reach;with chair alarm set;with nursing/sitter in room Nurse Communication: Mobility status;Patient requests pain meds PT Visit Diagnosis: Unsteadiness on feet (R26.81);Other abnormalities of gait and mobility (R26.89);Muscle weakness (generalized) (M62.81);Difficulty in walking, not elsewhere classified (R26.2);Pain    Time: 9024-0973 PT Time Calculation (min) (ACUTE ONLY): 28 min   Charges:   PT Evaluation $PT Eval Moderate Complexity: 1 Mod PT Treatments $Therapeutic Activity: 8-22 mins        Corrinna Karapetyan B. Migdalia Dk PT, DPT Acute Rehabilitation Services Pager [phone] Office [phone]   [place] 06/07/2020, 12:51 PM

## 2020-06-07 NOTE — Progress Notes (Addendum)
[facility] [place]  Subjective  - Doing well over all no new complaints.   Objective (!) 130/45 74 98.9 F (37.2 C) (Oral) 18 97%  Intake/Output Summary (Last 24 hours) at 06/07/2020 8550 Last data filed at 06/07/2020 0556 Gross per 24 hour  Intake --  Output 650 ml  Net -650 ml   Left groin soft without hematoma Right LE fasciotomy dressing clean and dry Motor intact B LE with doppler signals intact DP/PTB Lungs non labored breathing   Assessment/Planning: POD # 4 Right balloon angioplasty of tibioperoneal trunk  PT eval and treat order placed today Pending pain control and mobility  Will change dressing on right LE tomorrow PO Eliquis, Plavix, ASA 81 mg.  Roxy Horseman 06/07/2020 7:28 AM --  Laboratory Lab Results: Recent Labs    06/06/20 0059 06/07/20 0557  WBC 8.4 8.7  HGB 8.0* 8.4*  HCT 24.1* 24.9*  PLT 124* 145*   BMET No results for input(s): NA, K, CL, CO2, GLUCOSE, BUN, CREATININE, CALCIUM in the last 72 hours.  COAG Lab Results  Component Value Date   INR 1.1 01/18/2020   INR 1.3 (H) 08/30/2019   INR 1.2 07/23/2019   No results found for: PTT   VASCULAR STAFF ADDENDUM: I have independently interviewed and examined the patient. I agree with the above.  Doing well. No medical barriers to discharge. Needs to mobilize.  [name]. Stanford Breed, MD [facility] Phone Number: [phone] 06/07/2020 6:56 PM

## 2020-06-08 LAB — BASIC METABOLIC PANEL
Anion gap: 9 (ref 5–15)
BUN: 23 mg/dL (ref 8–23)
CO2: 24 mmol/L (ref 22–32)
Calcium: 8.4 mg/dL — ABNORMAL LOW (ref 8.9–10.3)
Chloride: 105 mmol/L (ref 98–111)
Creatinine, Ser: 1.25 mg/dL — ABNORMAL HIGH (ref 0.61–1.24)
GFR, Estimated: 58 mL/min — ABNORMAL LOW (ref >60)
Glucose, Bld: 101 mg/dL — ABNORMAL HIGH (ref 70–99)
Potassium: 4 mmol/L (ref 3.5–5.1)
Sodium: 138 mmol/L (ref 135–145)

## 2020-06-08 LAB — CBC
HCT: 24.6 % — ABNORMAL LOW (ref 39.0–52.0)
Hemoglobin: 8.1 g/dL — ABNORMAL LOW (ref 13.0–17.0)
MCH: 30.5 pg (ref 26.0–34.0)
MCHC: 32.9 g/dL (ref 30.0–36.0)
MCV: 92.5 fL (ref 80.0–100.0)
Platelets: 134 10*3/uL — ABNORMAL LOW (ref 150–400)
RBC: 2.66 MIL/uL — ABNORMAL LOW (ref 4.22–5.81)
RDW: 14.4 % (ref 11.5–15.5)
WBC: 5.4 10*3/uL (ref 4.0–10.5)
nRBC: 0 % (ref 0.0–0.2)

## 2020-06-08 LAB — GLUCOSE, CAPILLARY
Glucose-Capillary: 92 mg/dL (ref 70–99)
Glucose-Capillary: 161 mg/dL — ABNORMAL HIGH (ref 70–99)
Glucose-Capillary: 191 mg/dL — ABNORMAL HIGH (ref 70–99)

## 2020-06-08 LAB — TROPONIN I (HIGH SENSITIVITY)
Troponin I (High Sensitivity): 28 ng/L — ABNORMAL HIGH (ref <18)
Troponin I (High Sensitivity): 23 ng/L — ABNORMAL HIGH (ref <18)

## 2020-06-08 MED ORDER — DILTIAZEM HCL 60 MG PO TABS
60.0000 mg | ORAL_TABLET | Freq: Two times a day (BID) | ORAL | Status: DC
  Administered 2020-06-08 – 2020-06-12 (×7): 60 mg via ORAL
  Filled 2020-06-08 (×9): qty 1

## 2020-06-08 MED ORDER — OXYCODONE-ACETAMINOPHEN 5-325 MG PO TABS
1.0000 | ORAL_TABLET | ORAL | Status: DC | PRN
Start: 2020-06-08 — End: 2020-06-12
  Administered 2020-06-08 – 2020-06-10 (×7): 1 via ORAL
  Administered 2020-06-10: 2 via ORAL
  Administered 2020-06-11 (×3): 1 via ORAL
  Filled 2020-06-08 (×7): qty 1
  Filled 2020-06-08: qty 2
  Filled 2020-06-08 (×5): qty 1

## 2020-06-08 NOTE — Discharge Summary (Addendum)
[facility] Discharge Summary   Patient ID:  Donald Gordon MRN: [id number] DOB/AGE: 09-07-1940 79 y.o.  Admit date: 06/02/2020 Discharge date: 06/12/2020 Date of Surgery: 06/04/2020 Surgeon: Surgeon(s): Elam Dutch, MD  Admission Diagnosis: Lower limb ischemia [I99.8] Thrombosis of femoro-popliteal bypass graft [facility]) [K09.381W]  Discharge Diagnoses:  Lower limb ischemia [I99.8] Thrombosis of femoro-popliteal bypass graft ([place]) [E99.371I]  Secondary Diagnoses: Past Medical History:  Diagnosis Date  . Arthritis    "knees, elbows" (03/18/2018)  . Chronic edema    a. Chronic RLE edema.  Marland Kitchen COPD (chronic obstructive pulmonary disease) ([place])   . Coronary artery disease    a. MI s/p balloon 1996, details unclear.  Marland Kitchen Dysrhythmia    PROXIMAL MARGIN FIBRILATION  . GERD (gastroesophageal reflux disease)   . Hyperlipidemia   . Hypertension   . Myocardial infarction ([place]) 1996  . PAD (peripheral artery disease) ([place])    a. s/p stenting 08/2012, 02/2013.   Marland Kitchen PAF (paroxysmal atrial fibrillation) ([place])   . Sleep apnea    "have mask; have to start wearing it" (03/18/2018)  . Stroke [facility]) ~ 2014   denies residual on 03/18/2018  . Thrombosis of lower extremity    a. Listed on patient's medical bracelet - at [place].  Marland Kitchen Type II diabetes mellitus (HCC)     Procedure(s): RIGHT LOWER EXTREMITY FASCIOTOMY  Discharged Condition: stable   HPI: Donald Gordon is a 79 y.o. male who presents for 3 month follow-up of right lower extremity given multiple complex interventions.  He has a very complex history and records show in 2019 he had acute limb ischemia of the right leg that required thrombectomy of his tibials and a right common femoral to below-knee popliteal bypass by Dr. Bridgett Larsson with propaten.  Ultimately his bypass later occluded in February 2021 and he had stent of his right TP trunk up through the bypass with Viabahn's by Dr. [name].  He then reoccluded this and in March  2021 had thrombolysis of the stented bypass and this required proximal stent of his bypass with Eluvia as well as the distal stent into the TP trunk with a [place] again by Dr. [name].  Apparently had some interim work done on the right leg at the [place] as well.  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  He has remained on Plavix and Eliquis.  During his most recent event had come off his anticoagulation for colonoscopy when he presented in July with acute on chronic limb ischcemia.   He has developing a moderate stenosis in the distal graft with a drop in his ABIs and he is having some increased symptoms.   He presented to the ED with  acute right lower extremity pain and numbness. Pain began last night at about 5PM. Pain is typical of previous episodes of acute limb ischemia.   He was taken to the OR emergently.     Hospital Course:  Donald Gordon is a 79 y.o. male is S/P   Procedure(s):06/02/20 US guided LCFA access Right lower angiography with third order cannulation Initiation of intra-arterial thrombolysis Doppler flow restored in foot.  PROCEDURE:  06/03/20 Right lower extremity angiography with third order cannulation Drug-coated balloon angioplasty of tibioperoneal trunk (4x85mm [place])  He developed compartment syndrome RIGHT LOWER EXTREMITY FASCIOTOMY 06/04/20  Consults:  Treatment Team:  Cherre Robins,  MD    HIs fasciotomy sites were closed at bedside 06/06/20.  He tolerated this well.  He continues to have Doppler signals DP/PT B LE.  His chief complaint has been pain control with mobility.  He was restarted on his Eliquis for oral anticoagulation. He has known atrial fibrillation but developed new on set of atrial fibrillation with RVR with RBBB on Post op day 4. Troponin was elevated mildly. Patients Cardiologist, Dr.  Doylene Canard, was consulted and evaluated patient and felt this was the result of some demand ischemia. He started him on low dose diltiazem and metoprolol. Patient had no recurrent symptoms and tolerated cardiology recommendations. PT/OT recommend continued rehab. TOC helped to assist with SNF placement.He is in a stable disposition for discharge to skilled nursing facility.  He will follow up with Dr. Carlis Abbott with arterial duplex and for staple removal in about 2 weeks.  He was discharged in stable condition.   Significant Diagnostic Studies: CBC Lab Results  Component Value Date   WBC 5.8 06/12/2020   HGB 8.2 (L) 06/12/2020   HCT 24.9 (L) 06/12/2020   MCV 94.0 06/12/2020   PLT 248 06/12/2020    BMET    Component Value Date/Time   NA 138 06/08/2020 0120   K 4.0 06/08/2020 0120   CL 105 06/08/2020 0120   CO2 24 06/08/2020 0120   GLUCOSE 101 (H) 06/08/2020 0120   BUN 23 06/08/2020 0120   CREATININE 1.25 (H) 06/08/2020 0120   CALCIUM 8.4 (L) 06/08/2020 0120   GFRNONAA 58 (L) 06/08/2020 0120   GFRAA 52 (L) 01/19/2020 0547   COAG Lab Results  Component Value Date   INR 1.1 01/18/2020   INR 1.3 (H) 08/30/2019   INR 1.2 07/23/2019     Disposition:  Discharge to :Skilled nursing facility  Allergies as of 06/12/2020      Reactions   Oxycodone Nausea And Vomiting   Statins Other (See Comments)   Muscle and Bone pain   Amiodarone    Caused issues and was stopped by MD- possible was the cause for the patient's heart "going in and out of rhythm" multiple times   Donepezil Other (See Comments)   Per [facility]   Imipramine Other (See Comments)   Per [facility]   Oxybutynin Chloride Swelling, Other (See Comments)   Per [facility]      Medication List    TAKE these medications   acetaminophen 500 MG tablet Commonly known as: TYLENOL Take 500 mg by mouth 4 (four) times daily as needed (for pain or headaches).   apixaban 2.5 MG Tabs tablet Commonly known as: Eliquis Take 1 tablet (2.5 mg  total) by mouth 2 (two) times daily.   aspirin 81 MG EC tablet Take 1 tablet (81 mg total) by mouth daily. Swallow whole.   atorvastatin 40 MG tablet Commonly known as: LIPITOR Take 1 tablet (40 mg total) by mouth daily at 6 PM.   clopidogrel 75 MG tablet Commonly known as: PLAVIX Take 1 tablet (75 mg total) by mouth daily. What changed: when to take this   diltiazem 60 MG tablet Commonly known as: CARDIZEM Take 1 tablet (60 mg total) by mouth 2 (two) times daily.   ezetimibe 10 MG tablet Commonly known as: ZETIA Take 10 mg by mouth every evening.   ferrous sulfate 325 (65 FE) MG tablet Take 325 mg by mouth every Monday, Wednesday, and Friday.   finasteride 5 MG tablet Commonly known as: PROSCAR Take 5 mg by mouth daily.  hydrochlorothiazide 12.5 MG capsule Commonly known as: MICROZIDE Take 12.5 mg by mouth daily.   ibuprofen 200 MG tablet Commonly known as: ADVIL Take 400 mg by mouth every 6 (six) hours as needed (for headaches).   ketorolac 0.5 % ophthalmic solution Commonly known as: ACULAR Place 1 drop into the right eye daily.   lidocaine 5 % Commonly known as: LIDODERM Place 1 patch onto the skin daily as needed (for back pain- Remove & Discard patch within 12 hours or as directed by MD).   meclizine 25 MG tablet Commonly known as: ANTIVERT Take 25 mg by mouth 3 (three) times daily as needed for dizziness.   memantine 10 MG tablet Commonly known as: NAMENDA Take 10 mg by mouth in the morning and at bedtime.   metoprolol tartrate 50 MG tablet Commonly known as: LOPRESSOR Take 50 mg by mouth 2 (two) times daily.   metoprolol tartrate 25 MG tablet Commonly known as: LOPRESSOR Take 1 tablet (25 mg total) by mouth 2 (two) times daily.   nitroGLYCERIN 0.4 MG SL tablet Commonly known as: NITROSTAT Place 0.4 mg under the tongue every 5 (five) minutes x 3 doses as needed for chest pain.   oxyCODONE-acetaminophen 5-325 MG tablet Commonly known as:  PERCOCET/ROXICET Take 1 tablet by mouth every 6 (six) hours as needed for moderate pain.   pantoprazole 40 MG tablet Commonly known as: PROTONIX Take 40 mg by mouth daily before breakfast.   ramipril 10 MG capsule Commonly known as: ALTACE Take 10 mg by mouth 2 (two) times daily.   rivastigmine 6 MG capsule Commonly known as: EXELON Take 6 mg by mouth 2 (two) times daily.   vitamin B-12 500 MCG tablet Commonly known as: CYANOCOBALAMIN Take 500 mcg by mouth daily.   Vitamin D3 50 MCG (2000 UT) Tabs Take 2,000 Units by mouth in the morning.      Verbal and written Discharge instructions given to the patient. Wound care per Discharge AVS  Follow-up Information    [name], MD Follow up in 2 week(s).   Specialty: Vascular Surgery Contact information: [address] [place] [address] [phone]               Signed: Dagoberto Ligas 06/12/2020, 9:10 AM

## 2020-06-08 NOTE — Consult Note (Addendum)
Referring Physician: Dr. Janora Norlander, PA  Donald Gordon is an 79 y.o. male.                       Chief Complaint: Palpitations and chest pain  HPI: 79 years old white male with PMH of diastolic left heart failure, CAD, PAD, s/p thrombectomy and right tibioperoneal angioplasty, COPD, GERD, type 2 DM and hyperlipidemia has recurrent chest pain, dull, radiating to left arm and sometimes to right arm. EKG shows atrial fibrillation with RVR with RBBB.  Past Medical History:  Diagnosis Date  . Arthritis    "knees, elbows" (03/18/2018)  . Chronic edema    a. Chronic RLE edema.  Marland Kitchen COPD (chronic obstructive pulmonary disease) ([place])   . Coronary artery disease    a. MI s/p balloon 1996, details unclear.  Marland Kitchen Dysrhythmia    PROXIMAL MARGIN FIBRILATION  . GERD (gastroesophageal reflux disease)   . Hyperlipidemia   . Hypertension   . Myocardial infarction ([place]) 1996  . PAD (peripheral artery disease) ([place])    a. s/p stenting 08/2012, 02/2013.   Marland Kitchen PAF (paroxysmal atrial fibrillation) ([place])   . Sleep apnea    "have mask; have to start wearing it" (03/18/2018)  . Stroke [facility]) ~ 2014   denies residual on 03/18/2018  . Thrombosis of lower extremity    a. Listed on patient's medical bracelet - at [place].  Marland Kitchen Type II diabetes mellitus ([place])       Past Surgical History:  Procedure Laterality Date  . ABDOMINAL AORTOGRAM W/LOWER EXTREMITY N/A 07/25/2019   Procedure: ABDOMINAL AORTOGRAM W/LOWER EXTREMITY;  Surgeon: Waynetta Sandy, MD;  Location: [place] [facility];  Service: Cardiovascular;  Laterality: N/A;  . ANGIOPLASTY Right 06/03/2020   Procedure: BALLOON ANGIOPLASTY POPITEAL ARTERY;  Surgeon: Cherre Robins, MD;  Location: [place];  Service: Vascular;  Laterality: Right;  . ANTERIOR LUMBAR FUSION  1981  . APPENDECTOMY    . APPLICATION OF WOUND VAC Right 01/09/2018   Procedure: APPLICATION OF WOUND VAC ON RIGHT LOWER LEG;  Surgeon: Conrad St. Martin, MD;  Location: [place];  Service: Vascular;   Laterality: Right;  . BACK SURGERY    . CATARACT EXTRACTION W/ INTRAOCULAR LENS  IMPLANT, BILATERAL Bilateral   . CORONARY ANGIOPLASTY WITH STENT PLACEMENT     "I've got 1 stent in there" (03/18/2018)  . FASCIOTOMY Right 01/09/2018   Procedure: FASCIOTOMY RIGHT LOWER LEG;  Surgeon: Conrad Drummond, MD;  Location: [place];  Service: Vascular;  Laterality: Right;  . FASCIOTOMY Right 06/04/2020   Procedure: RIGHT LOWER EXTREMITY FASCIOTOMY;  Surgeon: Elam Dutch, MD;  Location: [place];  Service: Vascular;  Laterality: Right;  . FASCIOTOMY CLOSURE Right 01/11/2018   Procedure: FASCIOTOMY CLOSURE RIGHT CALF;  Surgeon: Conrad Me[place]aks, MD;  Location: [place];  Service: Vascular;  Laterality: Right;  . FEMORAL-POPLITEAL BYPASS GRAFT Right 01/09/2018   Procedure: RIGHT FEMORAL-POPLITEAL ARTERY BYPASS USING PROPATEN 6MM X 80CM VASCULAR GRAFT;  Surgeon: Conrad [place] Lodge, MD;  Location: [place];  Service: Vascular;  Laterality: Right;  . HEMORRHOID SURGERY    . LEFT HEART CATH AND CORONARY ANGIOGRAPHY N/A 03/19/2018   Procedure: LEFT HEART CATH AND CORONARY ANGIOGRAPHY;  Surgeon: Martinique, Peter M, MD;  Location: [place] [facility];  Service: Cardiovascular;  Laterality: N/A;  . LOWER EXTREMITY ANGIOGRAM Right 06/02/2020   Procedure: RIGHT LOWER EXTREMITY ANGIOGRAM INTERVENTION;  Surgeon: Cherre Robins, MD;  Location: [place];  Service:  Vascular;  Laterality: Right;  . LOWER EXTREMITY ANGIOGRAM Right 06/03/2020   Procedure: RIGHT LOWER EXTREMITY ANGIOGRAM from [place];  Surgeon: Cherre Robins, MD;  Location: [place];  Service: Vascular;  Laterality: Right;  . LOWER EXTREMITY ANGIOGRAPHY N/A 08/31/2019   Procedure: LOWER EXTREMITY ANGIOGRAPHY;  Surgeon: [name], MD;  Location: [place] [facility];  Service: Cardiovascular;  Laterality: N/A;  . LOWER EXTREMITY ANGIOGRAPHY Right 01/18/2020   Procedure: LOWER EXTREMITY ANGIOGRAPHY;  Surgeon: [name], MD;  Location: [place] [facility];   Service: Cardiovascular;  Laterality: Right;  . PATCH ANGIOPLASTY Right 01/09/2018   Procedure: PATCH ANGIOPLASTY USING Rueben Bash BIOLOGIC 1CM X 6CM PATCH;  Surgeon: Conrad [place], MD;  Location: [place];  Service: Vascular;  Laterality: Right;  . PERIPHERAL VASCULAR ATHERECTOMY Right 01/19/2020   Procedure: PERIPHERAL VASCULAR ATHERECTOMY;  Surgeon: [name], MD;  Location: [place] [facility];  Service: Cardiovascular;  Laterality: Right;  Femoral popliteal and tibioperoneal arteries.  Marland Kitchen PERIPHERAL VASCULAR BALLOON ANGIOPLASTY Right 09/01/2019   Procedure: PERIPHERAL VASCULAR BALLOON ANGIOPLASTY;  Surgeon: Waynetta Sandy, MD;  Location: [place] [facility];  Service: Cardiovascular;  Laterality: Right;  peroneal artery.  Marland Kitchen PERIPHERAL VASCULAR INTERVENTION Right 07/25/2019   Procedure: PERIPHERAL VASCULAR INTERVENTION;  Surgeon: Waynetta Sandy, MD;  Location: [place] [facility];  Service: Cardiovascular;  Laterality: Right;  SFA X 3  . PERIPHERAL VASCULAR INTERVENTION Right 09/01/2019   Procedure: PERIPHERAL VASCULAR INTERVENTION;  Surgeon: Waynetta Sandy, MD;  Location: [place] [facility];  Service: Cardiovascular;  Laterality: Right;  superficial femoral  . PERIPHERAL VASCULAR THROMBECTOMY Right 09/01/2019   Procedure: PERIPHERAL VASCULAR THROMBECTOMY;  Surgeon: Waynetta Sandy, MD;  Location: [place] [facility];  Service: Cardiovascular;  Laterality: Right;  . PERIPHERAL VASCULAR THROMBECTOMY Right 01/18/2020   Procedure: PERIPHERAL VASCULAR THROMBECTOMY;  Surgeon: [name], MD;  Location: [place] [facility];  Service: Cardiovascular;  Laterality: Right;  . PERIPHERAL VASCULAR THROMBECTOMY N/A 01/19/2020   Procedure: LYSIS RECHECK;  Surgeon: [name], MD;  Location: [place] [facility];  Service: Cardiovascular;  Laterality: N/A;  . POSTERIOR LUMBAR FUSION  1978  . THROMBECTOMY FEMORAL ARTERY Right 01/09/2018   Procedure:  THROMBECTOMY RIGHT LOWER LEG;  Surgeon: Conrad Stanton, MD;  Location: [place];  Service: Vascular;  Laterality: Right;  . TONSILLECTOMY      Family History  Problem Relation Age of Onset  . CAD Mother   . CAD Brother   . Hypertension Other    Social History:  reports that he quit smoking about 25 years ago. He has a 90.00 pack-year smoking history. He has never used smokeless tobacco. He reports that he does not drink alcohol and does not use drugs.  Allergies:  Allergies  Allergen Reactions  . Oxycodone Nausea And Vomiting  . Statins Other (See Comments)    Muscle and Bone pain   . Amiodarone     Caused issues and was stopped by MD- possible was the cause for the patient's heart "going in and out of rhythm" multiple times  . Donepezil Other (See Comments)    Per [facility]  . Imipramine Other (See Comments)    Per [facility]  . Oxybutynin Chloride Swelling and Other (See Comments)    Per [facility]    Medications Prior to Admission  Medication Sig Dispense Refill  . acetaminophen (TYLENOL) 500 MG tablet Take 500 mg by mouth 4 (four) times daily as needed (  for pain or headaches).    Marland Kitchen apixaban (ELIQUIS) 2.5 MG TABS tablet Take 1 tablet (2.5 mg total) by mouth 2 (two) times daily. 30 tablet 3  . Cholecalciferol (VITAMIN D3) 50 MCG (2000 UT) TABS Take 2,000 Units by mouth in the morning.    . clopidogrel (PLAVIX) 75 MG tablet Take 1 tablet (75 mg total) by mouth daily. (Patient taking differently: Take 75 mg by mouth every evening.) 90 tablet 1  . ezetimibe (ZETIA) 10 MG tablet Take 10 mg by mouth every evening.     . ferrous sulfate 325 (65 FE) MG tablet Take 325 mg by mouth every Monday, Wednesday, and Friday.    . finasteride (PROSCAR) 5 MG tablet Take 5 mg by mouth daily.    . hydrochlorothiazide (MICROZIDE) 12.5 MG capsule Take 12.5 mg by mouth daily.    Marland Kitchen ibuprofen (ADVIL) 200 MG tablet Take 400 mg by mouth every 6 (six) hours as needed (for headaches).    Marland Kitchen ketorolac (ACULAR) 0.5 %  ophthalmic solution Place 1 drop into the right eye daily.    Marland Kitchen lidocaine (LIDODERM) 5 % Place 1 patch onto the skin daily as needed (for back pain- Remove & Discard patch within 12 hours or as directed by MD).    Marland Kitchen meclizine (ANTIVERT) 25 MG tablet Take 25 mg by mouth 3 (three) times daily as needed for dizziness.    . memantine (NAMENDA) 10 MG tablet Take 10 mg by mouth in the morning and at bedtime.    . metoprolol tartrate (LOPRESSOR) 50 MG tablet Take 50 mg by mouth 2 (two) times daily.    . nitroGLYCERIN (NITROSTAT) 0.4 MG SL tablet Place 0.4 mg under the tongue every 5 (five) minutes x 3 doses as needed for chest pain.    . pantoprazole (PROTONIX) 40 MG tablet Take 40 mg by mouth daily before breakfast.    . ramipril (ALTACE) 10 MG capsule Take 10 mg by mouth 2 (two) times daily.    . rivastigmine (EXELON) 6 MG capsule Take 6 mg by mouth 2 (two) times daily.    . vitamin B-12 (CYANOCOBALAMIN) 500 MCG tablet Take 500 mcg by mouth daily.    . metoprolol tartrate (LOPRESSOR) 25 MG tablet Take 1 tablet (25 mg total) by mouth 2 (two) times daily. (Patient not taking: No sig reported) 60 tablet 3    Results for orders placed or performed during the hospital encounter of 06/02/20 (from the past 48 hour(s))  Glucose, capillary     Status: None   Collection Time: 06/06/20  4:22 PM  Result Value Ref Range   Glucose-Capillary 78 70 - 99 mg/dL    Comment: Glucose reference range applies only to samples taken after fasting for at least 8 hours.  Glucose, capillary     Status: Abnormal   Collection Time: 06/06/20  9:50 PM  Result Value Ref Range   Glucose-Capillary 126 (H) 70 - 99 mg/dL    Comment: Glucose reference range applies only to samples taken after fasting for at least 8 hours.  CBC     Status: Abnormal   Collection Time: 06/07/20  5:57 AM  Result Value Ref Range   WBC 8.7 4.0 - 10.5 K/uL   RBC 2.69 (L) 4.22 - 5.81 MIL/uL   Hemoglobin 8.4 (L) 13.0 - 17.0 g/dL   HCT 24.9 (L) 39.0 -  52.0 %   MCV 92.6 80.0 - 100.0 fL   MCH 31.2 26.0 - 34.0 pg  MCHC 33.7 30.0 - 36.0 g/dL   RDW 14.6 11.5 - 15.5 %   Platelets 145 (L) 150 - 400 K/uL   nRBC 0.0 0.0 - 0.2 %    Comment: Performed at [facility], [place] [address] [place], [place] [address]  Glucose, capillary     Status: Abnormal   Collection Time: 06/07/20  5:57 AM  Result Value Ref Range   Glucose-Capillary 109 (H) 70 - 99 mg/dL    Comment: Glucose reference range applies only to samples taken after fasting for at least 8 hours.  Glucose, capillary     Status: None   Collection Time: 06/07/20 11:51 AM  Result Value Ref Range   Glucose-Capillary 98 70 - 99 mg/dL    Comment: Glucose reference range applies only to samples taken after fasting for at least 8 hours.  Glucose, capillary     Status: None   Collection Time: 06/07/20  4:21 PM  Result Value Ref Range   Glucose-Capillary 98 70 - 99 mg/dL    Comment: Glucose reference range applies only to samples taken after fasting for at least 8 hours.  Glucose, capillary     Status: Abnormal   Collection Time: 06/07/20  9:35 PM  Result Value Ref Range   Glucose-Capillary 102 (H) 70 - 99 mg/dL    Comment: Glucose reference range applies only to samples taken after fasting for at least 8 hours.  CBC     Status: Abnormal   Collection Time: 06/08/20  1:20 AM  Result Value Ref Range   WBC 5.4 4.0 - 10.5 K/uL   RBC 2.66 (L) 4.22 - 5.81 MIL/uL   Hemoglobin 8.1 (L) 13.0 - 17.0 g/dL   HCT 24.6 (L) 39.0 - 52.0 %   MCV 92.5 80.0 - 100.0 fL   MCH 30.5 26.0 - 34.0 pg   MCHC 32.9 30.0 - 36.0 g/dL   RDW 14.4 11.5 - 15.5 %   Platelets 134 (L) 150 - 400 K/uL    Comment: REPEATED TO VERIFY   nRBC 0.0 0.0 - 0.2 %    Comment: Performed at [facility], [place] [address], [place], [place] [address]  Basic metabolic panel     Status: Abnormal   Collection Time: 06/08/20  1:20 AM  Result Value Ref Range   Sodium 138 135 - 145 mmol/L   Potassium 4.0 3.5 - 5.1 mmol/L    Chloride 105 98 - 111 mmol/L   CO2 24 22 - 32 mmol/L   Glucose, Bld 101 (H) 70 - 99 mg/dL    Comment: Glucose reference range applies only to samples taken after fasting for at least 8 hours.   BUN 23 8 - 23 mg/dL   Creatinine, Ser 1.25 (H) 0.61 - 1.24 mg/dL   Calcium 8.4 (L) 8.9 - 10.3 mg/dL   GFR, Estimated 58 (L) >60 mL/min    Comment: (NOTE) Calculated using the CKD-EPI Creatinine Equation (2021)    Anion gap 9 5 - 15    Comment: Performed at [facility] [address] [place], [place] [address]  Glucose, capillary     Status: None   Collection Time: 06/08/20  6:12 AM  Result Value Ref Range   Glucose-Capillary 92 70 - 99 mg/dL    Comment: Glucose reference range applies only to samples taken after fasting for at least 8 hours.  Glucose, capillary     Status: Abnormal   Collection Time: 06/08/20 12:10 PM  Result Value  Ref Range   Glucose-Capillary 161 (H) 70 - 99 mg/dL    Comment: Glucose reference range applies only to samples taken after fasting for at least 8 hours.  Troponin I (High Sensitivity)     Status: Abnormal   Collection Time: 06/08/20  3:15 PM  Result Value Ref Range   Troponin I (High Sensitivity) 28 (H) <18 ng/L    Comment: (NOTE) Elevated high sensitivity troponin I (hsTnI) values and significant  changes across serial measurements may suggest ACS but many other  chronic and acute conditions are known to elevate hsTnI results.  Refer to the "Links" section for chest pain algorithms and additional  guidance. Performed at [facility], [place] [address], [place], [place] [address]    No results found.  Review Of Systems Constitutional: No fever, chills, weight loss or gain. Eyes: No vision change, wears glasses. No discharge or pain. Ears: No hearing loss, No tinnitus. Respiratory: No asthma, positive COPD, pneumonias. No shortness of breath. No hemoptysis. Cardiovascular: Positive chest pain, palpitation, no leg edema. Gastrointestinal: No  nausea, vomiting, diarrhea, constipation. No GI bleed. No hepatitis. Genitourinary: No dysuria, hematuria, kidney stone. No incontinance. Neurological: No headache, stroke, seizures.  Psychiatry: No psych facility admission for anxiety, depression, suicide. No detox. Skin: No rash. Musculoskeletal: Positive joint pain, fibromyalgia. No neck pain, back pain. Lymphadenopathy: No lymphadenopathy. Hematology: Positive anemia or easy bruising.   Blood pressure 119/68, pulse (!) 128, temperature 98 F (36.7 C), temperature source Oral, resp. rate 18, height 5\' 9"  (1.753 m), weight 79.7 kg, SpO2 98 %. Body mass index is 25.95 kg/m. General appearance: alert, cooperative, appears stated age and no distress Head: Normocephalic, atraumatic. Eyes: Blue eyes, pale conjunctiva, corneas clear.  Neck: No adenopathy, no carotid bruit, no JVD, supple, symmetrical, trachea midline and thyroid not enlarged. Resp: Clear to auscultation bilaterally. Cardio: Irregular rate and rhythm, S1, S2 normal, II/VI systolic murmur, no click, rub or gallop GI: Soft, non-tender; bowel sounds normal; no organomegaly. Extremities: No edema, cyanosis or clubbing. Skin: Warm and dry.  Neurologic: Alert and oriented X 3, normal strength. Normal coordination and gait.  Assessment/Plan Paroxysmal atrial fibrillation, CHA2DS2VASc score of 7 CAD PAD Type 2 DM HTN HLD COPD GERD S/P right lower leg thrombectomy and angioplasty  Plan: Start low dose diltiazem along with Beta-blocker, Apixaban, ASA and Plavix. Agree with ruling out MI with troponin I levels.. 1st HS-troponin I is minimally elevated suggestive of demand ischemia.  Time spent: Review of old records, Lab, x-rays, EKG, other cardiac tests, examination, discussion with patient/Nurse/PA over 70 minutes.  Birdie Riddle, MD  06/08/2020, 4:14 PM

## 2020-06-08 NOTE — NC FL2 (Signed)
[place] LEVEL OF CARE SCREENING TOOL     IDENTIFICATION  Patient Name: Donald Gordon Birthdate: September 10, 1940 Sex: male Admission Date (Current Location): 06/02/2020  [facility] and [place] Number:  [employer] and Address:  [facility], [place] [address] [place], [place] [address]      Provider Number: [id number]  Attending Physician Name and Address:  Cherre Robins, MD  Relative Name and Phone Number:       Current Level of Care: Hospital Recommended Level of Care: [place] Prior Approval Number:    Date Approved/Denied:   PASRR Number: [id number]  Discharge Plan: SNF    Current Diagnoses: Patient Active Problem List   Diagnosis Date Noted  . Thrombosis of femoro-popliteal bypass graft ([place]) 06/02/2020  . Chest pain 05/18/2020  . Mixed diabetic hyperlipidemia associated with type 2 diabetes mellitus ([place]) 05/18/2020  . Acute renal failure superimposed on stage 3a chronic kidney disease ([place]) 05/18/2020  . 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 esophagitis 05/18/2020  . Chronic diastolic (congestive) heart failure ([place]) 05/18/2020  . Critical limb ischemia with history of revascularization of same extremity ([place]) 01/18/2020  . Critical lower limb ischemia ([place]) 08/31/2019  . PAD (peripheral artery disease) ([place]) 07/23/2019  . Ischemia of right lower extremity 01/09/2018  . Critical ischemia of foot ([place]) 01/09/2018  . Atrial fibrillation ([place]) 07/04/2014  . Atrial fibrillation with rapid ventricular response ([place])   . AF (paroxysmal atrial fibrillation) ([place]) 07/02/2014  . Essential hypertension 07/02/2014  . Coronary artery disease involving native coronary artery of native heart 07/02/2014  . Cough 07/02/2014  . Atrial fibrillation with RVR ([place]) 07/02/2014  . Type 2 diabetes mellitus with stage 3a chronic kidney disease, without long-term current use of insulin ([place]) 07/02/2014    Orientation RESPIRATION BLADDER Height &  Weight     Self,Time,Situation,Place  Normal Continent Weight: 175 lb 11.3 oz (79.7 kg) Height:  5\' 9"  (175.3 cm)  BEHAVIORAL SYMPTOMS/MOOD NEUROLOGICAL BOWEL NUTRITION STATUS      Continent Diet (please see discharge summary)  AMBULATORY STATUS COMMUNICATION OF NEEDS Skin   Supervision Verbally Surgical wounds (closed incision LF & RT groin)                       Personal Care Assistance Level of Assistance  Bathing,Feeding,Dressing Bathing Assistance: Limited assistance Feeding assistance: Independent Dressing Assistance: Limited assistance     Functional Limitations Info  Sight,Hearing,Speech Sight Info: Adequate Hearing Info: Adequate Speech Info: Adequate    SPECIAL CARE FACTORS FREQUENCY  PT (By licensed PT),OT (By licensed OT)     PT Frequency: 5x per week OT Frequency: 5x per week            Contractures Contractures Info: Not present    Additional Factors Info  Code Status,Allergies Code Status Info: FULL Allergies Info: Oxycodone,Statins,Amiodarone,Donepezil,Imipramine,Oxybutynin Chloride           Current Medications (06/08/2020):  This is the current hospital active medication list Current Facility-Administered Medications  Medication Dose Route Frequency Provider Last Rate Last Admin  . 0.9 %  sodium chloride infusion  250 mL Intravenous PRN Setzer, Edman Circle, PA-C      . 0.9 %  sodium chloride infusion  250 mL Intravenous PRN Setzer, Edman Circle, PA-C      . apixaban (ELIQUIS) tablet 5 mg  5 mg Oral BID Barbie Banner, PA-C   5 mg at 06/08/20 1122  . aspirin EC tablet 81  mg  81 mg Oral Daily Barbie Banner, PA-C   81 mg at 06/08/20 1122  . atorvastatin (LIPITOR) tablet 40 mg  40 mg Oral q1800 Barbie Banner, PA-C   40 mg at 06/07/20 1657  . Chlorhexidine Gluconate Cloth 2 % PADS 6 each  6 each Topical Daily Barbie Banner, PA-C   6 each at 06/06/20 [phone]  . clopidogrel (PLAVIX) tablet 75 mg  75 mg Oral Q breakfast Barbie Banner, PA-C    75 mg at 06/08/20 1122  . diltiazem (CARDIZEM) tablet 60 mg  60 mg Oral Q12H Dixie Dials, MD      . ezetimibe (ZETIA) tablet 10 mg  10 mg Oral QPM Barbie Banner, PA-C   10 mg at 06/07/20 1657  . feeding supplement (ENSURE ENLIVE / ENSURE PLUS) liquid 237 mL  237 mL Oral BID BM Cherre Robins, MD   237 mL at 06/06/20 1742  . finasteride (PROSCAR) tablet 5 mg  5 mg Oral Daily Barbie Banner, PA-C   5 mg at 06/08/20 1122  . hydrochlorothiazide (MICROZIDE) capsule 12.5 mg  12.5 mg Oral Daily Barbie Banner, PA-C   12.5 mg at 06/08/20 1121  . insulin aspart (novoLOG) injection 0-5 Units  0-5 Units Subcutaneous QHS Setzer, Sandra J, PA-C      . insulin aspart (novoLOG) injection 0-6 Units  0-6 Units Subcutaneous TID WC Barbie Banner, PA-C   1 Units at 06/06/20 1204  . labetalol (NORMODYNE) injection 10 mg  10 mg Intravenous Q10 min PRN Barbie Banner, PA-C   10 mg at 06/06/20 1859  . lactated ringers infusion   Intravenous Continuous Barbie Banner, PA-C   New Bag at 06/04/20 8295  . memantine (NAMENDA) tablet 10 mg  10 mg Oral q AM Barbie Banner, PA-C   10 mg at 06/08/20 [phone]  . metoprolol tartrate (LOPRESSOR) tablet 25 mg  25 mg Oral BID Barbie Banner, PA-C   25 mg at 06/08/20 1122  . morphine 2 MG/ML injection 2 mg  2 mg Intravenous Q1H PRN Cherre Robins, MD   2 mg at 06/08/20 0538  . nutrition supplement (JUVEN) (JUVEN) powder packet 1 packet  1 packet Oral BID BM Cherre Robins, MD   1 packet at 06/06/20 1742  . ondansetron (ZOFRAN) injection 4 mg  4 mg Intravenous Q6H PRN Setzer, Edman Circle, PA-C      . oxyCODONE-acetaminophen (PERCOCET/ROXICET) 5-325 MG per tablet 1-2 tablet  1-2 tablet Oral Q4H PRN Ulyses Amor, PA-C   1 tablet at 06/08/20 1122  . pantoprazole (PROTONIX) EC tablet 40 mg  40 mg Oral Daily Barbie Banner, PA-C   40 mg at 06/08/20 1122  . sodium chloride flush (NS) 0.9 % injection 3 mL  3 mL Intravenous Q12H Barbie Banner, PA-C   3 mL at 06/07/20  2232  . sodium chloride flush (NS) 0.9 % injection 3 mL  3 mL Intravenous PRN Barbie Banner, PA-C         Discharge Medications: Please see discharge summary for a list of discharge medications.  Relevant Imaging Results:  Relevant Lab Results:   Additional Information SSN [id number]  Vinie Sill, LCSWA

## 2020-06-08 NOTE — Progress Notes (Signed)
Pt with history of PAF, now with HR occasionally hitting 130 while lying in bed.  Spoke with Pt about what is normal for him.  He states it typically lasts a few mins to a few hours.  He says he has discussed it his heart doctor and believes he in on medication that "is supposed to help."  Paged PA Collins and received verbal order to perform EKG.  Print outs placed in chart and results read to PA on phone.  Orders placed for troponins and cardiology consult pending.  Pt remains comfortable in bed with frequent BP's cycling.  Will cont plan of care.

## 2020-06-08 NOTE — Progress Notes (Addendum)
[facility]  Subjective  - Sitting up at bedside, ambulation is limited by pain.   Objective (!) 137/56 68 98.3 F (36.8 C) (Oral) 17 99%  Intake/Output Summary (Last 24 hours) at 06/08/2020 0758 Last data filed at 06/08/2020 0500 Gross per 24 hour  Intake --  Output 600 ml  Net -600 ml   Left groin soft Right LE fasciotomy healing well dressing removed left open to air. Doppler signals DP/PT B LE Lungs non labored breathing    Assessment/Planning: POD # 5 Right balloon angioplasty of tibioperoneal trunk right LE fasciotomies closed  Pending SNF placement recommended PT Incisions are healing and flow to the right LE is patent     Donald Gordon 06/08/2020 7:58 AM --  Laboratory Lab Results: Recent Labs    06/07/20 0557 06/08/20 0120  WBC 8.7 5.4  HGB 8.4* 8.1*  HCT 24.9* 24.6*  PLT 145* 134*   BMET Recent Labs    06/08/20 0120  NA 138  K 4.0  CL 105  CO2 24  GLUCOSE 101*  BUN 23  CREATININE 1.25*  CALCIUM 8.4*    COAG Lab Results  Component Value Date   INR 1.1 01/18/2020   INR 1.3 (H) 08/30/2019   INR 1.2 07/23/2019   No results found for: PTT  VASCULAR STAFF ADDENDUM: I have independently interviewed and examined the patient. I agree with the above.  Having difficulty ambulating because of pain. Needs SNF. Working on placement.  [name]. Stanford Breed, MD [facility] Phone Number: [phone] 06/08/2020 8:35 AM

## 2020-06-08 NOTE — Progress Notes (Signed)
Pts dressings have been removed.  Medial calf incision bleeding mimimally in 2 places between staples.  Cleansed leg and dressed incision in dry gauze.  Assisted Pt to get comfortable in bed.  Trial of one PRN percocet for pain.  Will con't plan of care.

## 2020-06-08 NOTE — TOC Initial Note (Addendum)
Transition of Care [facility]) - Initial/Assessment Note    Patient Details  Name: Donald Gordon MRN: [id number] Date of Birth: 29-Sep-1940  Transition of Care [facility]) CM/SW Contact:    Vinie Sill, [place] Phone Number: 06/08/2020, 4:02 PM  Clinical Narrative:                  CSW visit with patient at bedside. CSW introduced self and explained role. CSW explained PT recommendations for short term rehab at [facility]. Patient states he lives home alone and is agreeable to SNF placement. CSW explained the SNF process,inculding using VA vs Medicare benefits. CSW explained utilizing VA benefits  still requires  approval and VA bed are limited, however, if approved VA pays 100%. CSW contacted VA-left voice message with April, to see if the patient is service connected, who is his assigned SW and if he must utilize his medicare benefits first for SNF. Patient was interested in [facility]-CSW contacted [facility]), they have no availability. CSW was given permission to send referrals to SNFs. Patient states no questions or concerns at this time.   CSW will provide bed offers when available. CSW will follow up on [place] benefits per request of the patient on Monday. CSW will continue to follow and assist with discharge planning.  Thurmond Butts, MSW, LCSW Clinical Social Worker   Expected Discharge Plan: Skilled Nursing Facility Barriers to Discharge: [place] Work up,SNF Pending bed offer   Patient Goals and CMS Choice        Expected Discharge Plan and Services Expected Discharge Plan: [place] In-house Referral: Clinical Social Work     Living arrangements for the past 2 months: Single Family Home                                      Prior Living Arrangements/Services Living arrangements for the past 2 months: Single Family Home Lives with:: Self Patient language and need for interpreter reviewed:: No Do you feel safe going  back to the place where you live?: Yes      Need for Family Participation in Patient Care: Yes (Comment) Care giver support system in place?: Yes (comment)   Criminal Activity/Legal Involvement Pertinent to Current Situation/Hospitalization: No - Comment as needed  Activities of Daily Living Home Assistive Devices/Equipment: Walker (specify type) ADL Screening (condition at time of admission) Patient's cognitive ability adequate to safely complete daily activities?: Yes Is the patient deaf or have difficulty hearing?: No Does the patient have difficulty seeing, even when wearing glasses/contacts?: No Does the patient have difficulty concentrating, remembering, or making decisions?: No Patient able to express need for assistance with ADLs?: Yes Does the patient have difficulty dressing or bathing?: No Independently performs ADLs?: Yes (appropriate for developmental age) Does the patient have difficulty walking or climbing stairs?: No Weakness of Legs: None Weakness of Arms/Hands: None  Permission Sought/Granted Permission sought to share information with : Family Supports Permission granted to share information with : Yes, Verbal Permission Granted  Share Information with NAME: Lyndee Hensen  Permission granted to share info w AGENCY: SNFs  Permission granted to share info w Relationship: daughter  Permission granted to share info w Contact Information: [phone]  Emotional Assessment Appearance:: Appears older than stated age Attitude/Demeanor/Rapport: Engaged Affect (typically observed): Accepting,Appropriate Orientation: : Oriented to Self,Oriented to Place,Oriented to  Time,Oriented to Situation Alcohol / Substance Use: Not Applicable Psych Involvement: No (  comment)  Admission diagnosis:  Lower limb ischemia [I99.8] Thrombosis of femoro-popliteal bypass graft [facility]) [B55.974B] Patient Active Problem List   Diagnosis Date Noted  . Thrombosis of femoro-popliteal bypass  graft ([place]) 06/02/2020  . Chest pain 05/18/2020  . Mixed diabetic hyperlipidemia associated with type 2 diabetes mellitus ([place]) 05/18/2020  . Acute renal failure superimposed on stage 3a chronic kidney disease ([place]) 05/18/2020  . 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 esophagitis 05/18/2020  . Chronic diastolic (congestive) heart failure ([place]) 05/18/2020  . Critical limb ischemia with history of revascularization of same extremity ([place]) 01/18/2020  . Critical lower limb ischemia ([place]) 08/31/2019  . PAD (peripheral artery disease) ([place]) 07/23/2019  . Ischemia of right lower extremity 01/09/2018  . Critical ischemia of foot ([place]) 01/09/2018  . Atrial fibrillation ([place]) 07/04/2014  . Atrial fibrillation with rapid ventricular response ([place])   . AF (paroxysmal atrial fibrillation) ([place]) 07/02/2014  . Essential hypertension 07/02/2014  . Coronary artery disease involving native coronary artery of native heart 07/02/2014  . Cough 07/02/2014  . Atrial fibrillation with RVR ([place]) 07/02/2014  . Type 2 diabetes mellitus with stage 3a chronic kidney disease, without long-term current use of insulin ([place]) 07/02/2014   PCP:  [place]:   [place], [address]. [place] [address] Phone: [phone] Fax: [phone]     Social Determinants of Health (SDOH) Interventions    Readmission Risk Interventions Readmission Risk Prevention Plan 05/21/2020 01/21/2020 07/26/2019  Post Dischage Appt - - Complete  Medication Screening - - Complete  Transportation Screening Complete Complete Complete  PCP or Specialist Appt within 3-5 Days Complete - -  HRI or Home Care Consult Complete Complete -  Social Work Consult for [place] Planning/Counseling Complete Complete -  Palliative Care Screening Not Applicable Not Applicable -  Medication Review Press photographer) Complete Complete -  Some recent data might be hidden

## 2020-06-09 LAB — CBC
HCT: 26.6 % — ABNORMAL LOW (ref 39.0–52.0)
Hemoglobin: 8.7 g/dL — ABNORMAL LOW (ref 13.0–17.0)
MCH: 30.3 pg (ref 26.0–34.0)
MCHC: 32.7 g/dL (ref 30.0–36.0)
MCV: 92.7 fL (ref 80.0–100.0)
Platelets: 195 10*3/uL (ref 150–400)
RBC: 2.87 MIL/uL — ABNORMAL LOW (ref 4.22–5.81)
RDW: 14.3 % (ref 11.5–15.5)
WBC: 5 10*3/uL (ref 4.0–10.5)
nRBC: 0 % (ref 0.0–0.2)

## 2020-06-09 LAB — GLUCOSE, CAPILLARY
Glucose-Capillary: 118 mg/dL — ABNORMAL HIGH (ref 70–99)
Glucose-Capillary: 146 mg/dL — ABNORMAL HIGH (ref 70–99)
Glucose-Capillary: 151 mg/dL — ABNORMAL HIGH (ref 70–99)
Glucose-Capillary: 112 mg/dL — ABNORMAL HIGH (ref 70–99)

## 2020-06-09 NOTE — Plan of Care (Signed)

## 2020-06-09 NOTE — Progress Notes (Signed)
[facility] [place]  Subjective  -no complaints.  States his foot feels okay.   Objective (!) 125/46 66 98 F (36.7 C) (Oral) 19 92%  Intake/Output Summary (Last 24 hours) at 06/09/2020 0954 Last data filed at 06/08/2020 2100 Gross per 24 hour  Intake 120 ml  Output 300 ml  Net -180 ml    Right PT brisk by Doppler Right leg fasciotomies closed with staples and some minimal bloody drainage on the medial incision  Laboratory Lab Results: Recent Labs    06/08/20 0120 06/09/20 0126  WBC 5.4 5.0  HGB 8.1* 8.7*  HCT 24.6* 26.6*  PLT 134* 195   BMET Recent Labs    06/08/20 0120  NA 138  K 4.0  CL 105  CO2 24  GLUCOSE 101*  BUN 23  CREATININE 1.25*  CALCIUM 8.4*    COAG Lab Results  Component Value Date   INR 1.1 01/18/2020   INR 1.3 (H) 08/30/2019   INR 1.2 07/23/2019   No results found for: PTT  Assessment/Planning:  79 year old male status post thrombolysis last week by Dr. Stanford Breed when he presented with acute on chronic limb ischemia of the right leg.  Later required fasciotomies that are closed with staples.  Appreciate cardiology seeing him yesterday and looks his troponins have trended down given initial plan to r/o cardiac event.  Cardiology started him on low-dose diltiazem plus beta-blocker.  He is on apixaban for his atrial fibrillation.  Aspirin Plavix as well.  Awaiting SNF placement.  [name] 06/09/2020 9:54 AM --

## 2020-06-09 NOTE — Progress Notes (Signed)
Encouraged patient to get out of bed to chair for supper. Patient declined at this time stating he might get up in a little while. Will monitor. Monzerrat Wellen, Bettina Gavia RN

## 2020-06-10 LAB — CBC
HCT: 25.4 % — ABNORMAL LOW (ref 39.0–52.0)
Hemoglobin: 7.9 g/dL — ABNORMAL LOW (ref 13.0–17.0)
MCH: 29.2 pg (ref 26.0–34.0)
MCHC: 31.1 g/dL (ref 30.0–36.0)
MCV: 93.7 fL (ref 80.0–100.0)
Platelets: 185 10*3/uL (ref 150–400)
RBC: 2.71 MIL/uL — ABNORMAL LOW (ref 4.22–5.81)
RDW: 14.1 % (ref 11.5–15.5)
WBC: 4.9 10*3/uL (ref 4.0–10.5)
nRBC: 0 % (ref 0.0–0.2)

## 2020-06-10 LAB — GLUCOSE, CAPILLARY
Glucose-Capillary: 102 mg/dL — ABNORMAL HIGH (ref 70–99)
Glucose-Capillary: 136 mg/dL — ABNORMAL HIGH (ref 70–99)
Glucose-Capillary: 122 mg/dL — ABNORMAL HIGH (ref 70–99)
Glucose-Capillary: 111 mg/dL — ABNORMAL HIGH (ref 70–99)

## 2020-06-10 NOTE — TOC Progression Note (Signed)
Transition of Care [facility]) - Progression Note    Patient Details  Name: Donald Gordon MRN: [id number] Date of Birth: [birth year]  Transition of Care [facility]) CM/SW [place], [place] Phone Number: [phone] 06/10/2020, 10:58 AM  Clinical Narrative:     Patient only has one bed offer. TOC team will wait until Monday to follow up about VA benefits and most likely more offers should have come back then.  TOC team will continue to assist with discharge planning needs.  Expected Discharge Plan: Skilled Nursing Facility Barriers to Discharge: [place] Work up,SNF Pending bed offer  Expected Discharge Plan and Services Expected Discharge Plan: [place] In-house Referral: Clinical Social Work     Living arrangements for the past 2 months: Single Family Home                                       Social Determinants of Health (SDOH) Interventions    Readmission Risk Interventions Readmission Risk Prevention Plan 05/21/2020 01/21/2020 07/26/2019  Post Dischage Appt - - Complete  Medication Screening - - Complete  Transportation Screening Complete Complete Complete  PCP or Specialist Appt within 3-5 Days Complete - -  HRI or Home Care Consult Complete Complete -  Social Work Consult for [place] Planning/Counseling Complete Complete -  Palliative Care Screening Not Applicable Not Applicable -  Medication Review Press photographer) Complete Complete -  Some recent data might be hidden

## 2020-06-10 NOTE — Progress Notes (Addendum)
  Progress Note    06/10/2020 7:55 AM 6 Days Post-Op  Subjective:  Says overall he is feeling not as good as usual this morning. Says he feels very achy all over   Vitals:   06/10/20 0027 06/10/20 0300  BP: (!) 149/57 (!) 95/52  Pulse: (!) 58 (!) 57  Resp: 14 17  Temp: 98 F (36.7 C) 98 F (36.7 C)  SpO2: 92% 96%   Physical Exam: Cardiac:  regular Lungs: non labored Incisions: right leg fasciotomies intact with staples. Minimal bloody drainage from medial incision Extremities:  2+ right femoral pulse, brisk doppler PT/ DP and peroneal signals. Feet warm and well perfused. Edema of right foot Neurologic: alert and oriented  CBC    Component Value Date/Time   WBC 4.9 06/10/2020 0059   RBC 2.71 (L) 06/10/2020 0059   HGB 7.9 (L) 06/10/2020 0059   HCT 25.4 (L) 06/10/2020 0059   PLT 185 06/10/2020 0059   MCV 93.7 06/10/2020 0059   MCH 29.2 06/10/2020 0059   MCHC 31.1 06/10/2020 0059   RDW 14.1 06/10/2020 0059   LYMPHSABS 1.1 06/02/2020 0847   MONOABS 0.9 06/02/2020 0847   EOSABS 0.1 06/02/2020 0847   BASOSABS 0.0 06/02/2020 0847    BMET    Component Value Date/Time   NA 138 06/08/2020 0120   K 4.0 06/08/2020 0120   CL 105 06/08/2020 0120   CO2 24 06/08/2020 0120   GLUCOSE 101 (H) 06/08/2020 0120   BUN 23 06/08/2020 0120   CREATININE 1.25 (H) 06/08/2020 0120   CALCIUM 8.4 (L) 06/08/2020 0120   GFRNONAA 58 (L) 06/08/2020 0120   GFRAA 52 (L) 01/19/2020 0547    INR    Component Value Date/Time   INR 1.1 01/18/2020 0827     Intake/Output Summary (Last 24 hours) at 06/10/2020 0755 Last data filed at 06/09/2020 2002 Gross per 24 hour  Intake 120 ml  Output --  Net 120 ml     Assessment/Plan:  79 y.o. male is s/p Right lower extremity angiography with third order cannulation, Drug-coated balloon angioplasty of tibioperoneal trunk (4x70mm Ranger) 7 Days post op. He subsequently underwent fasciotomy of right lateral and anterior superficial posterior  compartments 6 Days Post-Op. On diltiazem and BB per Cardiology recommendation. Also on Apixaban for his atrial fibrillation. Continue Aspirin and Plavix. Encourage mobilization. Pending SNF bed availability  DVT prophylaxis:  Apixaban   Karoline Caldwell, PA-C [facility] [phone] 06/10/2020 7:55 AM   I have seen and evaluated the patient. I agree with the PA note as documented above.  79 year old male status post thrombolysis of occluded right lower extremity stents with additional angioplasty.  He has very brisk right PT signal.  Fasciotomies are closed and he has some minimal drainage from the medial fasciotomy that is serosanguinous.  Awaiting SNF placement.  Remains on APixiban and aspirin Plavix.  [name], MD [facility] [place] Office: [phone]

## 2020-06-11 LAB — CBC
HCT: 29.3 % — ABNORMAL LOW (ref 39.0–52.0)
Hemoglobin: 8.9 g/dL — ABNORMAL LOW (ref 13.0–17.0)
MCH: 29.2 pg (ref 26.0–34.0)
MCHC: 30.4 g/dL (ref 30.0–36.0)
MCV: 96.1 fL (ref 80.0–100.0)
Platelets: 263 10*3/uL (ref 150–400)
RBC: 3.05 MIL/uL — ABNORMAL LOW (ref 4.22–5.81)
RDW: 14.1 % (ref 11.5–15.5)
WBC: 6.5 10*3/uL (ref 4.0–10.5)
nRBC: 0 % (ref 0.0–0.2)

## 2020-06-11 LAB — GLUCOSE, CAPILLARY
Glucose-Capillary: 100 mg/dL — ABNORMAL HIGH (ref 70–99)
Glucose-Capillary: 123 mg/dL — ABNORMAL HIGH (ref 70–99)
Glucose-Capillary: 143 mg/dL — ABNORMAL HIGH (ref 70–99)
Glucose-Capillary: 118 mg/dL — ABNORMAL HIGH (ref 70–99)

## 2020-06-11 LAB — SARS CORONAVIRUS 2 BY RT PCR (HOSPITAL ORDER, PERFORMED IN ~~LOC~~ HOSPITAL LAB): SARS Coronavirus 2: NEGATIVE

## 2020-06-11 NOTE — TOC Progression Note (Addendum)
Transition of Care [facility]) - Progression Note    Patient Details  Name: Donald Gordon MRN: [id number] Date of Birth: 05-19-1941  Transition of Care [facility]) CM/SW [place], [place] Phone Number: 06/11/2020, 9:55 AM  Clinical Narrative:     Called [facility] -left voice message with SW Cindy Pelham-waiting on cal to determine if SNF will be covered by the [place]. P  Received call form patient's daughter,[name]- she requested to send referrals to [place] and [facility]- referrals has been sent.   Thurmond Butts, MSW, LCSW Clinical Social Worker   Expected Discharge Plan: Skilled Nursing Facility Barriers to Discharge: Insurance Authorization,Continued Medical Work up,SNF Pending bed offer  Expected Discharge Plan and Services Expected Discharge Plan: [place] In-house Referral: Clinical Social Work     Living arrangements for the past 2 months: Single Family Home                                       Social Determinants of Health (SDOH) Interventions    Readmission Risk Interventions Readmission Risk Prevention Plan 05/21/2020 01/21/2020 07/26/2019  Post Dischage Appt - - Complete  Medication Screening - - Complete  Transportation Screening Complete Complete Complete  PCP or Specialist Appt within 3-5 Days Complete - -  HRI or Home Care Consult Complete Complete -  Social Work Consult for [place] Planning/Counseling Complete Complete -  Palliative Care Screening Not Applicable Not Applicable -  Medication Review Press photographer) Complete Complete -  Some recent data might be hidden

## 2020-06-11 NOTE — Plan of Care (Signed)
  Problem: Clinical Measurements: Goal: Diagnostic test results will improve Outcome: Progressing Goal: Respiratory complications will improve Outcome: Progressing Goal: Cardiovascular complication will be avoided Outcome: Progressing   

## 2020-06-11 NOTE — Progress Notes (Signed)
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 s/p 12/11 R LE angiography and initiation of intra-arterial thrombolysis. s/p 12/12 R LE angiography with drug-coated ballone angioplasty of tibioperoneal trunk.s/p 12/13 fasciotomy of R lateral anterior and superficial posterior compartments.    PT Comments    Patient progressing slowly towards PT goals. Reports pain is more tolerable in RLE especially with mobility. Continues to have some swelling in RLE and some numbness but pt reports this has improved. Improved ambulation distance with Min guard-Min A for balance/safety with 2/4 DOE and a few seated rest breaks needed due to fatigue. Encouraged elevating RLE and increasing mobility as tolerated to improve strength/mobility. Pt awaiting SNF bed. Will follow.    Follow Up Recommendations  SNF     Equipment Recommendations  None recommended by PT    Recommendations for Other Services       Precautions / Restrictions Precautions Precautions: Fall Restrictions Weight Bearing Restrictions: Yes RLE Weight Bearing: Weight bearing as tolerated    Mobility  Bed Mobility Overal bed mobility: Needs Assistance Bed Mobility: Supine to Sit;Sit to Supine     Supine to sit: Supervision;HOB elevated Sit to supine: Supervision;HOB elevated   General bed mobility comments:  supervision for safety, use of rail for support, no dizziness.  Transfers Overall transfer level: Needs assistance Equipment used: Rolling walker (2 wheeled) Transfers: Sit to/from Stand Sit to Stand: Min guard         General transfer comment: min guard for safety, cues for hand placement as pt prefers to pull up on RW; stood from EOB x1, from toilet x1, from chair x1.  Ambulation/Gait Ambulation/Gait assistance: Min guard Gait Distance (Feet): 100 Feet (+20 x2) Assistive device: Rolling walker (2 wheeled) Gait Pattern/deviations: Step-to pattern;Decreased step length - left;Antalgic;Trunk flexed;Decreased stance time - right Gait velocity: slowed Gait velocity interpretation: <1.31 ft/sec, indicative of household ambulator General Gait Details: Slow, mildly unsteady gait with cues for heel strike at initial contact; difficulty with turns needing min A for support. 2 seated rest breaks.   Stairs             Wheelchair Mobility    Modified Rankin (Stroke Patients Only)       Balance Overall balance assessment: Needs assistance Sitting-balance support: No upper extremity supported;Feet supported Sitting balance-Leahy Scale: Good     Standing balance support: During functional activity Standing balance-Leahy Scale: Good Standing balance comment: Able to reach down and pick up tele box from floor wtihout LOB.                            Cognition Arousal/Alertness: Awake/alert Behavior During Therapy: WFL for tasks assessed/performed Overall Cognitive Status: Within Functional Limits for tasks assessed                                 General Comments: asking appropriate questions  like if they found his a SNF bed      Exercises      General Comments General comments (skin integrity, edema, etc.): VSS on RA. Swelling improved in RLE per pt report.      Pertinent Vitals/Pain Pain Assessment: Faces Faces Pain Scale: Hurts little  more Pain Location: R fasciotomy site Pain Descriptors / Indicators: Throbbing;Guarding;Aching Pain Intervention(s): Monitored during session;Repositioned    Home Living                      Prior Function            PT Goals (current goals can now be found in the care plan section) Progress towards PT goals: Progressing toward goals    Frequency    Min 2X/week      PT Plan Current plan remains appropriate    Co-evaluation              AM-PAC PT "6 Clicks" Mobility   Outcome Measure  Help needed turning from your back to your side while in a flat bed without using bedrails?: None Help needed moving from lying on your back to sitting on the side of a flat bed without using bedrails?: None Help needed moving to and from a bed to a chair (including a wheelchair)?: A Little Help needed standing up from a chair using your arms (e.g., wheelchair or bedside chair)?: A Little Help needed to walk in hospital room?: A Little Help needed climbing 3-5 steps with a railing? : A Lot 6 Click Score: 19    End of Session Equipment Utilized During Treatment: Gait belt Activity Tolerance: Patient tolerated treatment well Patient left: in bed;with call bell/phone within reach;with bed alarm set Nurse Communication: Mobility status PT Visit Diagnosis: Unsteadiness on feet (R26.81);Other abnormalities of gait and mobility (R26.89);Muscle weakness (generalized) (M62.81);Difficulty in walking, not elsewhere classified (R26.2);Pain Pain - Right/Left: Right Pain - part of body: Leg     Time: 9024-0973 PT Time Calculation (min) (ACUTE ONLY): 31 min  Charges:  $Gait Training: 8-22 mins $Therapeutic Activity: 8-22 mins                     Marisa Severin, PT, DPT Acute Rehabilitation Services Pager [phone] Office [place] 06/11/2020, 10:25 AM

## 2020-06-11 NOTE — TOC Progression Note (Signed)
Transition of Care [facility]) - Progression Note    Patient Details  Name: URAL ACREE MRN: [id number] Date of Birth: 1941-03-12  Transition of Care [facility]) CM/SW [place], [place] Phone Number: 06/11/2020, 12:23 PM  Clinical Narrative:     CSW received call from [place]- she states the patient needs to use his [place] benefits- he is only 50% service connected. She advised she has spoken with his daughter, Sharyn Lull and advised as well. CSW called and confirmed information with the patient's daughter.   [facility] and [facility] SNFs are reviewing referrals- they will contact CSW once completed.  Thurmond Butts, MSW, LCSW Clinical Social Worker   Expected Discharge Plan: Skilled Nursing Facility Barriers to Discharge: Insurance Authorization,Continued Medical Work up,SNF Pending bed offer  Expected Discharge Plan and Services Expected Discharge Plan: [place] In-house Referral: Clinical Social Work     Living arrangements for the past 2 months: Single Family Home                                       Social Determinants of Health (SDOH) Interventions    Readmission Risk Interventions Readmission Risk Prevention Plan 05/21/2020 01/21/2020 07/26/2019  Post Dischage Appt - - Complete  Medication Screening - - Complete  Transportation Screening Complete Complete Complete  PCP or Specialist Appt within 3-5 Days Complete - -  HRI or Home Care Consult Complete Complete -  Social Work Consult for [place] Planning/Counseling Complete Complete -  Palliative Care Screening Not Applicable Not Applicable -  Medication Review Press photographer) Complete Complete -  Some recent data might be hidden

## 2020-06-11 NOTE — Progress Notes (Addendum)
  Progress Note    06/11/2020 10:03 AM 7 Days Post-Op  Subjective: no complaints   Vitals:   06/11/20 0809 06/11/20 0815  BP: (!) 142/45   Pulse: 61 64  Resp: 18   Temp:    SpO2: 97%    Physical Exam: Cardiac: regular Lungs: non labored Incisions:  Right leg fasciotomy sites intact. Medial incision dressings clean and dry Extremities:  Bilateral lower extremities well perfused. Brisk right PT, monophasic DP Neurologic: alert and oriented  CBC    Component Value Date/Time   WBC 6.5 06/11/2020 0109   RBC 3.05 (L) 06/11/2020 0109   HGB 8.9 (L) 06/11/2020 0109   HCT 29.3 (L) 06/11/2020 0109   PLT 263 06/11/2020 0109   MCV 96.1 06/11/2020 0109   MCH 29.2 06/11/2020 0109   MCHC 30.4 06/11/2020 0109   RDW 14.1 06/11/2020 0109   LYMPHSABS 1.1 06/02/2020 0847   MONOABS 0.9 06/02/2020 0847   EOSABS 0.1 06/02/2020 0847   BASOSABS 0.0 06/02/2020 0847    BMET    Component Value Date/Time   NA 138 06/08/2020 0120   K 4.0 06/08/2020 0120   CL 105 06/08/2020 0120   CO2 24 06/08/2020 0120   GLUCOSE 101 (H) 06/08/2020 0120   BUN 23 06/08/2020 0120   CREATININE 1.25 (H) 06/08/2020 0120   CALCIUM 8.4 (L) 06/08/2020 0120   GFRNONAA 58 (L) 06/08/2020 0120   GFRAA 52 (L) 01/19/2020 0547    INR    Component Value Date/Time   INR 1.1 01/18/2020 0827     Intake/Output Summary (Last 24 hours) at 06/11/2020 1003 Last data filed at 06/11/2020 0800 Gross per 24 hour  Intake 840 ml  Output 201 ml  Net 639 ml     Assessment/Plan:  79 y.o. male is s/p Right lower extremity angiography with third order cannulation, Drug-coated balloon angioplasty of tibioperoneal trunk (4x91mm Ranger) 8 Days post op. He subsequently underwent fasciotomy of right lateral and anterior superficial posterior compartments  7 Days Post-Op. Fasciotomy sites intact. There is some minimal serosanguinous drainage from medial fasciotomy site. Brisk PT signal, DP signal also present but monophasic. On  Aspirin, Plavix and Apixaban. Awaiting SNF placement  DVT prophylaxis:  Apixaban    Karoline Caldwell, PA-C [facility] [phone] 06/11/2020 10:03 AM   I have seen and evaluated the patient. I agree with the PA note as documented above.  Status post thrombolysis of occluded right lower extremity stents with subsequent angioplasty.  Has brisk PT signal still.  Awaiting SNF placement.  [name], MD [facility] [place] Office: [phone]

## 2020-06-11 NOTE — Progress Notes (Signed)
Patient had BM with bright red blood. Minimal amount. Asked patient, patient stated that. "he has hemorrhoids and sometimes act up.   NT told this RN that patient had another episode of blood with BM during dayshift.   Will continue to monitor

## 2020-06-11 NOTE — TOC Progression Note (Signed)
Transition of Care [facility]) - Progression Note    Patient Details  Name: Donald Gordon MRN: [id number] Date of Birth: 1941/04/10  Transition of Care [facility]) CM/SW [place], [place] Phone Number: 06/11/2020, 3:49 PM  Clinical Narrative:     Patient's daughter, Sharyn Lull has accepted bed offer with [facility]. [facility] has confirmed they can accept patient tomorrow. Covid test completed.  CSW will continue to follow and assist with discharge planning.   Thurmond Butts, MSW, LCSW Clinical Social Worker   Expected Discharge Plan: Skilled Nursing Facility Barriers to Discharge: Insurance Authorization,Continued Medical Work up,SNF Pending bed offer  Expected Discharge Plan and Services Expected Discharge Plan: [place] In-house Referral: Clinical Social Work     Living arrangements for the past 2 months: Single Family Home                                       Social Determinants of Health (SDOH) Interventions    Readmission Risk Interventions Readmission Risk Prevention Plan 05/21/2020 01/21/2020 07/26/2019  Post Dischage Appt - - Complete  Medication Screening - - Complete  Transportation Screening Complete Complete Complete  PCP or Specialist Appt within 3-5 Days Complete - -  HRI or Home Care Consult Complete Complete -  Social Work Consult for [place] Planning/Counseling Complete Complete -  Palliative Care Screening Not Applicable Not Applicable -  Medication Review Press photographer) Complete Complete -  Some recent data might be hidden

## 2020-06-11 NOTE — Plan of Care (Signed)

## 2020-06-12 ENCOUNTER — Other Ambulatory Visit: Payer: Self-pay | Admitting: *Deleted

## 2020-06-12 DIAGNOSIS — M6281 Muscle weakness (generalized): Secondary | ICD-10-CM | POA: Diagnosis not present

## 2020-06-12 DIAGNOSIS — I5032 Chronic diastolic (congestive) heart failure: Secondary | ICD-10-CM | POA: Diagnosis not present

## 2020-06-12 DIAGNOSIS — R42 Dizziness and giddiness: Secondary | ICD-10-CM | POA: Diagnosis not present

## 2020-06-12 DIAGNOSIS — F0390 Unspecified dementia without behavioral disturbance: Secondary | ICD-10-CM | POA: Diagnosis not present

## 2020-06-12 DIAGNOSIS — Z7901 Long term (current) use of anticoagulants: Secondary | ICD-10-CM | POA: Diagnosis not present

## 2020-06-12 DIAGNOSIS — R2681 Unsteadiness on feet: Secondary | ICD-10-CM | POA: Diagnosis not present

## 2020-06-12 DIAGNOSIS — I959 Hypotension, unspecified: Secondary | ICD-10-CM | POA: Diagnosis not present

## 2020-06-12 DIAGNOSIS — R41841 Cognitive communication deficit: Secondary | ICD-10-CM | POA: Diagnosis not present

## 2020-06-12 DIAGNOSIS — I998 Other disorder of circulatory system: Secondary | ICD-10-CM | POA: Diagnosis not present

## 2020-06-12 DIAGNOSIS — K219 Gastro-esophageal reflux disease without esophagitis: Secondary | ICD-10-CM | POA: Diagnosis not present

## 2020-06-12 DIAGNOSIS — Z7401 Bed confinement status: Secondary | ICD-10-CM | POA: Diagnosis not present

## 2020-06-12 DIAGNOSIS — R531 Weakness: Secondary | ICD-10-CM | POA: Diagnosis not present

## 2020-06-12 DIAGNOSIS — T82868D Thrombosis of vascular prosthetic devices, implants and grafts, subsequent encounter: Secondary | ICD-10-CM | POA: Diagnosis not present

## 2020-06-12 DIAGNOSIS — R609 Edema, unspecified: Secondary | ICD-10-CM | POA: Diagnosis not present

## 2020-06-12 DIAGNOSIS — J449 Chronic obstructive pulmonary disease, unspecified: Secondary | ICD-10-CM | POA: Diagnosis not present

## 2020-06-12 DIAGNOSIS — I2510 Atherosclerotic heart disease of native coronary artery without angina pectoris: Secondary | ICD-10-CM | POA: Diagnosis not present

## 2020-06-12 DIAGNOSIS — R278 Other lack of coordination: Secondary | ICD-10-CM | POA: Diagnosis not present

## 2020-06-12 DIAGNOSIS — I739 Peripheral vascular disease, unspecified: Secondary | ICD-10-CM | POA: Diagnosis not present

## 2020-06-12 DIAGNOSIS — N400 Enlarged prostate without lower urinary tract symptoms: Secondary | ICD-10-CM | POA: Diagnosis not present

## 2020-06-12 DIAGNOSIS — I10 Essential (primary) hypertension: Secondary | ICD-10-CM | POA: Diagnosis not present

## 2020-06-12 DIAGNOSIS — M1990 Unspecified osteoarthritis, unspecified site: Secondary | ICD-10-CM | POA: Diagnosis not present

## 2020-06-12 DIAGNOSIS — M2550 Pain in unspecified joint: Secondary | ICD-10-CM | POA: Diagnosis not present

## 2020-06-12 DIAGNOSIS — R079 Chest pain, unspecified: Secondary | ICD-10-CM | POA: Diagnosis not present

## 2020-06-12 LAB — CBC
HCT: 24.9 % — ABNORMAL LOW (ref 39.0–52.0)
Hemoglobin: 8.2 g/dL — ABNORMAL LOW (ref 13.0–17.0)
MCH: 30.9 pg (ref 26.0–34.0)
MCHC: 32.9 g/dL (ref 30.0–36.0)
MCV: 94 fL (ref 80.0–100.0)
Platelets: 248 10*3/uL (ref 150–400)
RBC: 2.65 MIL/uL — ABNORMAL LOW (ref 4.22–5.81)
RDW: 14.2 % (ref 11.5–15.5)
WBC: 5.8 10*3/uL (ref 4.0–10.5)
nRBC: 0 % (ref 0.0–0.2)

## 2020-06-12 LAB — GLUCOSE, CAPILLARY
Glucose-Capillary: 98 mg/dL (ref 70–99)
Glucose-Capillary: 101 mg/dL — ABNORMAL HIGH (ref 70–99)

## 2020-06-12 MED ORDER — OXYCODONE-ACETAMINOPHEN 5-325 MG PO TABS: 1.0000 | ORAL_TABLET | Freq: Four times a day (QID) | ORAL | 0 refills | Status: DC | PRN

## 2020-06-12 MED ORDER — DILTIAZEM HCL 60 MG PO TABS: 60.0000 mg | ORAL_TABLET | Freq: Two times a day (BID) | ORAL | 1 refills | Status: DC

## 2020-06-12 MED ORDER — ASPIRIN 81 MG PO TBEC: 81.0000 mg | DELAYED_RELEASE_TABLET | Freq: Every day | ORAL | 11 refills | Status: DC

## 2020-06-12 MED ORDER — ATORVASTATIN CALCIUM 40 MG PO TABS: 40.0000 mg | ORAL_TABLET | Freq: Every day | ORAL | 1 refills | Status: DC

## 2020-06-12 NOTE — Progress Notes (Addendum)
  Progress Note    06/12/2020 7:54 AM 8 Days Post-Op  Subjective:  Anxiously anticipating discharge to SNF today   Vitals:   06/12/20 0421 06/12/20 0741  BP: (!) 141/60 (!) 149/57  Pulse: (!) 55 (!) 57  Resp: 15 15  Temp: 98.1 F (36.7 C) 97.9 F (36.6 C)  SpO2: 94% 94%   Physical Exam: Lungs:  Non labored Incisions:  L groin without hematoma; R lateral fasciotomy c/d/i; medial fasciotomy with some serous collection on dressing Extremities:  R PT and ATA with doppler signal; L DP doppler signal Abdomen:  soft Neurologic: A&O  CBC    Component Value Date/Time   WBC 5.8 06/12/2020 0044   RBC 2.65 (L) 06/12/2020 0044   HGB 8.2 (L) 06/12/2020 0044   HCT 24.9 (L) 06/12/2020 0044   PLT 248 06/12/2020 0044   MCV 94.0 06/12/2020 0044   MCH 30.9 06/12/2020 0044   MCHC 32.9 06/12/2020 0044   RDW 14.2 06/12/2020 0044   LYMPHSABS 1.1 06/02/2020 0847   MONOABS 0.9 06/02/2020 0847   EOSABS 0.1 06/02/2020 0847   BASOSABS 0.0 06/02/2020 0847    BMET    Component Value Date/Time   NA 138 06/08/2020 0120   K 4.0 06/08/2020 0120   CL 105 06/08/2020 0120   CO2 24 06/08/2020 0120   GLUCOSE 101 (H) 06/08/2020 0120   BUN 23 06/08/2020 0120   CREATININE 1.25 (H) 06/08/2020 0120   CALCIUM 8.4 (L) 06/08/2020 0120   GFRNONAA 58 (L) 06/08/2020 0120   GFRAA 52 (L) 01/19/2020 0547    INR    Component Value Date/Time   INR 1.1 01/18/2020 0827     Intake/Output Summary (Last 24 hours) at 06/12/2020 0754 Last data filed at 06/12/2020 0005 Gross per 24 hour  Intake 720 ml  Output 200 ml  Net 520 ml     Assessment/Plan:  79 y.o. male is s/p Right lower extremity angiography with third order cannulation,Drug-coated balloon angioplasty of tibioperoneal trunk (4x85mm Ranger)8 Days post op. He subsequently underwent fasciotomy of right lateral and anterior superficial posterior compartments 8 Days Post-Op   -BLE well perfused with peripheral doppler signals -RLE edema and  some serous drainage from medial fasciotomy incision; encouraged elevation of leg above heart when in bed; continue dry dressing changes as needed -Ok for discharge to SNF when bed approved   Dagoberto Ligas, PA-C [facility] [phone] 06/12/2020 7:54 AM  I have seen and evaluated the patient. I agree with the PA note as documented above.  79 year old male status post thrombolysis of right lower extremity with history of complex multiple revascularizations.  He has a brisk PT signal today.  Awaiting SNF placement.  Clinically looks very good.  We will move up his follow-up to early January for staple removal from fasciotomy incisions and right leg arterial duplex.  Hopefully can get discharged to a SNF today.  [name], MD [facility] [place] Office: [phone]

## 2020-06-12 NOTE — Progress Notes (Signed)
D/C instructions given to patient. Message left on daughter phone to let her know that he is discharging to SNF. IV removed, clean and intact. [facility] to escort pt to SNF. Report called to [facility].  Clyde Canterbury, RN

## 2020-06-12 NOTE — Progress Notes (Signed)
Called to room by NT, stating that patient was confused. Upon entering patient was sitting on the chair closest to door. When asked what he was doing, patient stated, that he didn't see anyone when he woke up. He was headed to the door to find someone.  Patient could answer name, time, and situation. Patient could not answer where he was at. Eventually patient said that he ws a [facility].   Patient placed back in be. Bed alarm on. Will continue to monitor

## 2020-06-12 NOTE — TOC Transition Note (Signed)
Transition of Care [facility]) - CM/SW Discharge Note   Patient Details  Name: Donald Gordon MRN: [id number] Date of Birth: [birth year]  Transition of [facility] [place]) CM/SW Contact:  Trula Ore, [place] Phone Number: 06/12/2020, 10:40 AM   Clinical Narrative:     Patient will DC to: [facility]  Anticipated DC date: 06/12/2020  Family notified: Sharyn Lull   Transport by: Corey Harold   ?  Per MD patient ready for DC to [facility] . RN, patient, patient's family, and facility notified of DC. Discharge Summary sent to facility. RN given number for report tele# [phone] RM#[id number]. DC packet on chart. Ambulance transport requested for patient.  CSW signing off.    Final next level of care: Skilled Nursing Facility Barriers to Discharge: No Barriers Identified   Patient Goals and CMS Choice Patient states their goals for this hospitalization and ongoing recovery are:: to go to SNF CMS Medicare.gov Compare Post Acute Care list provided to:: Patient Choice offered to / list presented to : Patient  Discharge Placement              Patient chooses bed at: [facility] Patient to be transferred to facility by: [facility] Name of family member notified: Sharyn Lull Patient and family notified of of transfer: 06/12/20  Discharge Plan and Services In-house Referral: Clinical Social Work                                   Social Determinants of Health (SDOH) Interventions     Readmission Risk Interventions Readmission Risk Prevention Plan 05/21/2020 01/21/2020 07/26/2019  Post Dischage Appt - - Complete  Medication Screening - - Complete  Transportation Screening Complete Complete Complete  PCP or Specialist Appt within 3-5 Days Complete - -  HRI or Home Care Consult Complete Complete -  Social Work Consult for [place] Planning/Counseling Complete Complete -  Palliative Care Screening Not Applicable Not Applicable -  Medication Review Press photographer) Complete Complete  -  Some recent data might be hidden

## 2020-06-12 NOTE — Progress Notes (Signed)
Informed by NT that patient stated that, "his leg was hurting". This RN took pain med into the room only for the patient to tell this RN that he was not drinking nothing right now.   Patient was asked if he wanted the pain med or for me to take it back. Patient told RN to take everything back.  This RN was in patient's room for 10-15 minutes explaining/convincing the patient that he was in [place]. That he has been here since 12/11. Patient stated that he didn't know where his car was. Explained to patient that he came on 12/11 and this RN did not know where his car was.   Patient proceeded to tell this RN that he thinks he had another stroke. When asked why, patient stated that he has had a previous one. Neuro intact.  Patient stated that he can't wrap his head around what is going on. Patient also stated that he didn't remember having his surgery on his leg. Again the patient was reminded of his surgery, how long he has been here.  Informed patient that he will hopefull go to [facility] later today.  Patient finally conceded and stated that he was going to lay back down and try to get his head straight.   Will continue to monitor

## 2020-06-12 NOTE — Discharge Instructions (Signed)
° °  [facility] [place] ° °Discharge Instructions ° °Lower Extremity Angiogram; Angioplasty/Stenting ° °Please refer to the following instructions for your post-procedure care. Your surgeon or physician assistant will discuss any changes with you. ° °Activity ° °Avoid lifting more than 8 pounds (1 gallons of milk) for 72 hours (3 days) after your procedure. You may walk as much as you can tolerate. It's OK to drive after 72 hours. ° °Bathing/Showering ° °You may shower the day after your procedure. If you have a bandage, you may remove it at 24- 48 hours. Clean your incision site with mild soap and water. Pat the area dry with a clean towel. ° °Diet ° °Resume your pre-procedure diet. There are no special food restrictions following this procedure. All patients with peripheral vascular disease should follow a low fat/low cholesterol diet. In order to heal from your surgery, it is CRITICAL to get adequate nutrition. Your body requires vitamins, minerals, and protein. Vegetables are the best source of vitamins and minerals. Vegetables also provide the perfect balance of protein. Processed food has little nutritional value, so try to avoid this. ° °Medications ° °Resume taking all of your medications unless your doctor tells you not to. If your incision is causing pain, you may take over-the-counter pain relievers such as acetaminophen (Tylenol) ° °Follow Up ° °Follow up will be arranged at the time of your procedure. You may have an office visit scheduled or may be scheduled for surgery. Ask your surgeon if you have any questions. ° °Please call us immediately for any of the following conditions: °•Severe or worsening pain your legs or feet at rest or with walking. °•Increased pain, redness, drainage at your groin puncture site. °•Fever of 101 degrees or higher. °•If you have any mild or slow bleeding from your puncture site: lie down, apply firm constant pressure over the area with a piece of  gauze or a clean wash cloth for 30 minutes- no peeking!, call 911 right away if you are still bleeding after 30 minutes, or if the bleeding is heavy and unmanageable. ° °Reduce your risk factors of vascular disease: ° °Stop smoking. If you would like help call [facility] at 1-800-QUIT-NOW (1-800-784-8669) or  at 336-586-4000. °Manage your cholesterol °Maintain a desired weight °Control your diabetes °Keep your blood pressure down ° °If you have any questions, please call the office at [phone] ° °

## 2020-06-25 ENCOUNTER — Ambulatory Visit (INDEPENDENT_AMBULATORY_CARE_PROVIDER_SITE_OTHER)
Admission: RE | Admit: 2020-06-25 | Discharge: 2020-06-25 | Disposition: A | Discharge status: Home / Self Care | Payer: Medicare Other | Source: Ambulatory Visit | Attending: Vascular Surgery | Admitting: Vascular Surgery

## 2020-06-25 ENCOUNTER — Ambulatory Visit (HOSPITAL_COMMUNITY)
Admission: RE | Admit: 2020-06-25 | Discharge: 2020-06-25 | Disposition: A | Discharge status: Home / Self Care | Payer: Medicare Other | Source: Ambulatory Visit | Attending: Physician Assistant | Admitting: Physician Assistant

## 2020-06-25 ENCOUNTER — Ambulatory Visit (INDEPENDENT_AMBULATORY_CARE_PROVIDER_SITE_OTHER): Payer: Self-pay | Admitting: Physician Assistant

## 2020-06-25 ENCOUNTER — Other Ambulatory Visit (HOSPITAL_COMMUNITY): Payer: Self-pay | Admitting: Vascular Surgery

## 2020-06-25 ENCOUNTER — Encounter (HOSPITAL_COMMUNITY): Payer: No Typology Code available for payment source

## 2020-06-25 ENCOUNTER — Other Ambulatory Visit: Payer: Self-pay

## 2020-06-25 VITALS — BP 160/48 | HR 53 | Temp 97.4°F | Resp 18 | Ht 69.0 in | Wt 170.0 lb

## 2020-06-25 DIAGNOSIS — I998 Other disorder of circulatory system: Secondary | ICD-10-CM

## 2020-06-25 DIAGNOSIS — I739 Peripheral vascular disease, unspecified: Secondary | ICD-10-CM

## 2020-06-25 DIAGNOSIS — Z09 Encounter for follow-up examination after completed treatment for conditions other than malignant neoplasm: Secondary | ICD-10-CM

## 2020-06-25 NOTE — Progress Notes (Signed)
POST OPERATIVE OFFICE NOTE    CC:  F/u for surgery  HPI:  This is a 80 y.o. 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 Bridgett Larsson with propaten.  Ultimately his bypass later occluded in February 2021 and he had stent of his right TP trunk up through the bypass with Viabahn's by Dr. [name].  He then reoccluded this and in March 2021 had thrombolysis of the stented bypass and this required proximal stent of his bypass with Eluvia as well as the distal stent into the TP trunk with a [place] again by Dr. [name].  Apparently had some interim work done on the right leg at the [place] as well.  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  He has remained on Plavix and Eliquis.  During his most recent event had come off his anticoagulation for colonoscopy when he presented in July with acute on chronic limb ischcemia.    He was seen in our office on 05/29/20 by Dr. Carlis Abbott.  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  He was asked to stop his anticoagulation in preporation for an angiogram with possible intervention.  He then presented to the [facility] ED with right LE pain and decreased motor.  Dr. Stanford Breed saw him at [facility] ED and proceeded with : PROCEDURE:   US guided LCFA access Right lower angiography with third order cannulation Initiation of intra-arterial thrombolysis  He returned to the [facility] lab on 06/03/20: Right lower extremity angiography with third order cannulation Drug-coated balloon angioplasty of tibioperoneal trunk (4x61mm Ranger)   He was noted to have compartment syndrome on 06/04/20 and returned to the OR with Dr. Oneida Alar for Fasciotomy of right lateral anterior and superficial  posterior compartments.  He maintained right LE Doppler signals DP/PT.  He has known atrial fibrillation but developed new on set of atrial fibrillation with RVR with RBBB on Post op day 4. Troponin was elevated mildly. Patients Cardiologist, Dr. Doylene Canard, was consulted and evaluated patient and felt this was the result of some demand ischemia. He started him on low dose diltiazem and metoprolol.  Pt returns today for follow up with arterial duplex and staple removal.  He denise incisional problems, he is ambulatory with occasional sharp pains in the right leg surrounding the incisions. He lives alone and is able to perform ADL's.  Allergies  Allergen Reactions  . Oxycodone Nausea And Vomiting  . Statins Other (See Comments)    Muscle and Bone pain   . Amiodarone     Caused issues and was stopped by MD- possible was the cause for the patient's heart "going in and out of rhythm" multiple times  . Donepezil Other (See Comments)    Per [facility]  . Imipramine Other (See Comments)    Per [facility]  . Oxybutynin Chloride Swelling and Other (See Comments)    Per [facility]    Current Outpatient Medications  Medication Sig Dispense Refill  . acetaminophen (TYLENOL) 500 MG tablet Take 500 mg by mouth 4 (four) times daily as needed (for pain or headaches).    Marland Kitchen apixaban (ELIQUIS) 2.5 MG TABS tablet Take 1 tablet (2.5 mg total) by mouth 2 (two) times daily. [place]  tablet 3  . aspirin EC 81 MG EC tablet Take 1 tablet (81 mg total) by mouth daily. Swallow whole. 30 tablet 11  . atorvastatin (LIPITOR) 40 MG tablet Take 1 tablet (40 mg total) by mouth daily at 6 PM. 30 tablet 1  . Cholecalciferol (VITAMIN D3) 50 MCG (2000 UT) TABS Take 2,000 Units by mouth in the morning.    . clopidogrel (PLAVIX) 75 MG tablet Take 1 tablet (75 mg total) by mouth daily. (Patient taking differently: Take 75 mg by mouth every evening.) 90 tablet 1  . diltiazem (CARDIZEM) 60 MG tablet Take 1 tablet (60 mg total) by mouth 2 (two) times daily.  60 tablet 1  . ezetimibe (ZETIA) 10 MG tablet Take 10 mg by mouth every evening.     . ferrous sulfate 325 (65 FE) MG tablet Take 325 mg by mouth every Monday, Wednesday, and Friday.    . finasteride (PROSCAR) 5 MG tablet Take 5 mg by mouth daily.    . hydrochlorothiazide (MICROZIDE) 12.5 MG capsule Take 12.5 mg by mouth daily.    Marland Kitchen ibuprofen (ADVIL) 200 MG tablet Take 400 mg by mouth every 6 (six) hours as needed (for headaches).    Marland Kitchen ketorolac (ACULAR) 0.5 % ophthalmic solution Place 1 drop into the right eye daily.    Marland Kitchen lidocaine (LIDODERM) 5 % Place 1 patch onto the skin daily as needed (for back pain- Remove & Discard patch within 12 hours or as directed by MD).    Marland Kitchen meclizine (ANTIVERT) 25 MG tablet Take 25 mg by mouth 3 (three) times daily as needed for dizziness.    . memantine (NAMENDA) 10 MG tablet Take 10 mg by mouth in the morning and at bedtime.    . metoprolol tartrate (LOPRESSOR) 25 MG tablet Take 1 tablet (25 mg total) by mouth 2 (two) times daily. (Patient not taking: No sig reported) 60 tablet 3  . metoprolol tartrate (LOPRESSOR) 50 MG tablet Take 50 mg by mouth 2 (two) times daily.    . nitroGLYCERIN (NITROSTAT) 0.4 MG SL tablet Place 0.4 mg under the tongue every 5 (five) minutes x 3 doses as needed for chest pain.    Marland Kitchen oxyCODONE-acetaminophen (PERCOCET/ROXICET) 5-325 MG tablet Take 1 tablet by mouth every 6 (six) hours as needed for moderate pain. 20 tablet 0  . pantoprazole (PROTONIX) 40 MG tablet Take 40 mg by mouth daily before breakfast.    . ramipril (ALTACE) 10 MG capsule Take 10 mg by mouth 2 (two) times daily.    . rivastigmine (EXELON) 6 MG capsule Take 6 mg by mouth 2 (two) times daily.    . vitamin B-12 (CYANOCOBALAMIN) 500 MCG tablet Take 500 mcg by mouth daily.     No current facility-administered medications for this visit.     ROS:  See HPI  Physical Exam:    +-----------+--------+-----+--------+----------+--------+  RIGHT   PSV  cm/sRatioStenosisWaveform Comments  +-----------+--------+-----+--------+----------+--------+  CFA Distal 136          triphasic       +-----------+--------+-----+--------+----------+--------+  TP Trunk  100          monophasic      +-----------+--------+-----+--------+----------+--------+  ATA Prox  116          biphasic       +-----------+--------+-----+--------+----------+--------+  PTA Distal 82          biphasic       +-----------+--------+-----+--------+----------+--------+  PERO Distal38  monophasic      +-----------+--------+-----+--------+----------+--------+       Right Graft #1: Fem-pop  +------------------+--------+--------+----------+--------+           PSV cm/sStenosisWaveform Comments  +------------------+--------+--------+----------+--------+  Inflow      143       biphasic       +------------------+--------+--------+----------+--------+  Prox Anastomosis 153       triphasic       +------------------+--------+--------+----------+--------+  Proximal Graft  74       monophasic      +------------------+--------+--------+----------+--------+  Mid Graft     70       monophasic      +------------------+--------+--------+----------+--------+  Distal Graft   58       biphasic       +------------------+--------+--------+----------+--------+  Distal Anastomosis127       monophasic      +------------------+--------+--------+----------+--------+  Outflow      159       monophasic      +------------------+--------+--------+----------+--------+   Prior occluded graft identified.       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     ABI Findings:   +---------+------------------+-----+----------+--------+  Right  Rt Pressure (mmHg)IndexWaveform Comment   +---------+------------------+-----+----------+--------+  Brachial 177                      +---------+------------------+-----+----------+--------+  ATA   168        0.95            +---------+------------------+-----+----------+--------+  PTA   168        0.95 triphasic       +---------+------------------+-----+----------+--------+  DP                monophasic      +---------+------------------+-----+----------+--------+  Great Toe87        0.49            +---------+------------------+-----+----------+--------+   +---------+------------------+-----+--------+-------+  Left   Lt Pressure (mmHg)IndexWaveformComment  +---------+------------------+-----+--------+-------+  Brachial 170                     +---------+------------------+-----+--------+-------+  ATA   153        0.86           +---------+------------------+-----+--------+-------+  PTA   191        1.08           +---------+------------------+-----+--------+-------+  Great Toe96        0.54           +---------+------------------+-----+--------+-------+   +-------+-----------+-----------+------------+------------+  ABI/TBIToday's ABIToday's TBIPrevious ABIPrevious TBI  +-------+-----------+-----------+------------+------------+  Right 0.95    0.49    0.88    0.48      +-------+-----------+-----------+------------+------------+  Left  1.08    0.54    1.01    0.58      +-------+-----------+-----------+------------+------------+     Bilateral ABIs appear essentially unchanged compared to prior study on  05/29/2020.    Summary:  Right: Resting  right ankle-brachial index is within normal range. No  evidence of significant right lower extremity arterial disease. The right  toe-brachial index is abnormal. RT great toe pressure = 87 mmHg.   Left: Resting left ankle-brachial index is within normal range. No  evidence of significant left lower extremity arterial disease. The left  toe-brachial index is abnormal. LT Great toe pressure = 96 mmHg.   Incision:  Well healed incisions, staples removed patient tolerated this well Extremities:  Minimal edema in the calf area, no edema in the right foot Neuro: motor and sensation intact and equal B LE Lungs :  Non labored breathing  Assessment/Plan:  This is a 80 y.o. male who is s/p: Dr. Stanford Breed saw him at [facility] ED and proceeded with : PROCEDURE:   US guided LCFA access Right lower angiography with third order cannulation Initiation of intra-arterial thrombolysis  He returned to the [facility] lab on 06/03/20: Right lower extremity angiography with third order cannulation Drug-coated balloon angioplasty of tibioperoneal trunk (4x2mm Ranger)  Fasciotomy sites are healing well.  Dry dressing applied over staple removal area.  He is encouraged to walk daily and elevate his legs when at rest.  The bypass is patent with right GT presser 87 mm hg.  He will f/u in 6 months for repeat studies Bypass duplex and ABI's.  If he has problems or concerns he will call sooner.    Roxy Horseman PA-C [facility] [phone]  Clinic MD:  Trula Slade

## 2020-06-27 ENCOUNTER — Encounter (HOSPITAL_COMMUNITY): Payer: No Typology Code available for payment source

## 2020-06-27 ENCOUNTER — Other Ambulatory Visit: Payer: Self-pay

## 2020-06-27 DIAGNOSIS — I998 Other disorder of circulatory system: Secondary | ICD-10-CM

## 2020-06-28 ENCOUNTER — Encounter (HOSPITAL_COMMUNITY): Payer: Self-pay

## 2020-06-28 ENCOUNTER — Ambulatory Visit (HOSPITAL_COMMUNITY): Admit: 2020-06-28 | Payer: No Typology Code available for payment source | Admitting: Vascular Surgery

## 2020-06-28 SURGERY — ABDOMINAL AORTOGRAM W/LOWER EXTREMITY
Anesthesia: LOCAL | Laterality: Bilateral

## 2020-07-16 ENCOUNTER — Ambulatory Visit (HOSPITAL_COMMUNITY): Admission: RE | Admit: 2020-07-16 | Payer: Medicare Other | Source: Ambulatory Visit

## 2020-07-17 ENCOUNTER — Encounter (HOSPITAL_COMMUNITY): Payer: No Typology Code available for payment source

## 2020-07-17 ENCOUNTER — Encounter: Payer: No Typology Code available for payment source | Admitting: Vascular Surgery

## 2020-07-19 ENCOUNTER — Telehealth: Payer: Self-pay | Admitting: *Deleted

## 2020-07-19 NOTE — Telephone Encounter (Signed)
Called patient's sgo to see if they would be willing to do CT, lvm for a return call

## 2020-07-31 ENCOUNTER — Telehealth: Payer: Self-pay | Admitting: *Deleted

## 2020-07-31 NOTE — Telephone Encounter (Signed)
CALLED PATIENT TO ASK IF HE WOULD BE INTERESTED IN RESCHEDULING A CT, LVM FOR A RETURN CALL

## 2020-08-08 ENCOUNTER — Telehealth: Payer: Self-pay | Admitting: *Deleted

## 2020-08-08 NOTE — Telephone Encounter (Signed)
CALLED PATIENT TO INFORM OF STAT LABS ON 08-14-20 @ 3:30 PM @ [place] AND CT TO FOLLOW ON 08-14-20- ARRIVAL TIME- 3:45 @ [facility], PATIENT TO HAVE WATER ONLY- 4 HRS. PRIOR TO TEST, LVM FOR A RETURN CALL

## 2020-08-14 ENCOUNTER — Ambulatory Visit: Payer: Medicare Other | Attending: Radiation Oncology

## 2020-08-14 ENCOUNTER — Ambulatory Visit (HOSPITAL_COMMUNITY): Admission: RE | Admit: 2020-08-14 | Payer: No Typology Code available for payment source | Source: Ambulatory Visit

## 2020-08-15 ENCOUNTER — Telehealth: Payer: Self-pay | Admitting: Cardiology

## 2020-08-15 NOTE — Telephone Encounter (Signed)
New message:     Chelsey from the [place] calling concering this patient need a sooner apt. This is a NP

## 2020-08-20 ENCOUNTER — Telehealth: Payer: Self-pay | Admitting: *Deleted

## 2020-08-20 NOTE — Telephone Encounter (Signed)
CALLED PATIENT TO ASK ABOUT RESCHEDULING MISSED SCAN FOR 08-14-20, LVM FOR A RETURN CALL

## 2020-08-20 NOTE — Telephone Encounter (Signed)
CALLED PATIENT TO INFORM OF STAT LABS @ 11 AM ON 08-31-20 @ [place] AND HIS CT TO FOLLOW ON 08-31-20 - ARRIVAL TIME- 11:45 AM @ [facility], PATIENT TO HAVE WATER ONLY - 4 HRS. PRIOR TO TEST, PATIENT TO FU WITH ALISON PERKINS ON 09-03-20 VIA TELEPHONE FOR RESULTS, LVM FOR A RETURN CALL

## 2020-08-27 ENCOUNTER — Telehealth: Payer: Self-pay

## 2020-08-27 NOTE — Telephone Encounter (Signed)
Left patient a voicemail message in regards to telephone visit with Shona Simpson PA on 09/03/20 @ 3:00pm. Advised this is not an in person visit. Called to review meaningful use questions. TM

## 2020-08-31 ENCOUNTER — Ambulatory Visit (HOSPITAL_COMMUNITY)
Admission: RE | Admit: 2020-08-31 | Discharge: 2020-08-31 | Disposition: A | Discharge status: Home / Self Care | Payer: No Typology Code available for payment source | Source: Ambulatory Visit | Attending: Radiation Oncology | Admitting: Radiation Oncology

## 2020-08-31 ENCOUNTER — Other Ambulatory Visit: Payer: Self-pay

## 2020-08-31 ENCOUNTER — Ambulatory Visit
Admission: RE | Admit: 2020-08-31 | Discharge: 2020-08-31 | Disposition: A | Discharge status: Home / Self Care | Payer: Medicare Other | Source: Ambulatory Visit | Attending: Radiation Oncology | Admitting: Radiation Oncology

## 2020-08-31 DIAGNOSIS — C3490 Malignant neoplasm of unspecified part of unspecified bronchus or lung: Secondary | ICD-10-CM | POA: Insufficient documentation

## 2020-08-31 LAB — BUN & CREATININE (CHCC)
BUN: 24 mg/dL — ABNORMAL HIGH (ref 8–23)
Creatinine: 1.51 mg/dL — ABNORMAL HIGH (ref 0.61–1.24)
GFR, Estimated: 46 mL/min — ABNORMAL LOW (ref >60)

## 2020-08-31 MED ORDER — IOHEXOL 300 MG/ML  SOLN
75.0000 mL | Freq: Once | INTRAMUSCULAR | Status: AC | PRN
  Administered 2020-08-31: 75 mL via INTRAVENOUS

## 2020-09-03 ENCOUNTER — Other Ambulatory Visit: Payer: Self-pay

## 2020-09-03 ENCOUNTER — Ambulatory Visit
Admission: RE | Admit: 2020-09-03 | Discharge: 2020-09-03 | Disposition: A | Discharge status: Home / Self Care | Payer: Medicare Other | Source: Ambulatory Visit | Attending: Radiation Oncology | Admitting: Radiation Oncology

## 2020-09-03 DIAGNOSIS — I5032 Chronic diastolic (congestive) heart failure: Secondary | ICD-10-CM

## 2020-09-03 DIAGNOSIS — I480 Paroxysmal atrial fibrillation: Secondary | ICD-10-CM

## 2020-09-03 DIAGNOSIS — I743 Embolism and thrombosis of arteries of the lower extremities: Secondary | ICD-10-CM

## 2020-09-03 DIAGNOSIS — R911 Solitary pulmonary nodule: Secondary | ICD-10-CM | POA: Insufficient documentation

## 2020-09-03 DIAGNOSIS — I25119 Atherosclerotic heart disease of native coronary artery with unspecified angina pectoris: Secondary | ICD-10-CM

## 2020-09-03 DIAGNOSIS — E1169 Type 2 diabetes mellitus with other specified complication: Secondary | ICD-10-CM

## 2020-09-03 NOTE — Progress Notes (Addendum)
Radiation Oncology         [phone] ________________________________  Outpatient Follow Up- Conducted via telephone due to current COVID-19 concerns for limiting patient exposure  I spoke with the patient to conduct this consult visit via telephone to spare the patient unnecessary potential exposure in the healthcare setting during the current COVID-19 pandemic. The patient was notified in advance and was offered a [place] meeting to allow for face to face communication but unfortunately reported that they did not have the appropriate resources/technology to support such a visit and instead preferred to proceed with a telephone visit.    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   HISTORY OF PRESENT ILLNESS: ROTH RESS is a 80 y.o. male seen at the request of Dr. 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 The patient has a history of COPD, and recent CT scan on 02/22/2020 was performed following a chest x-ray that had shown a possible nodule. The CT showed the area in the right lower lobe measuring 10 x 8 x 8 mm, along the paraspinal region posterior to the posterior pulmonary veins. No evidence of adenopathy was identified. Pulmonary medicine met with the patient at the [place], Dr. Deboraha Sprang recommended following this area as well as the possibility of meeting with [place] to discuss putative stereotactic body  radiotherapy. He is not a good candidate for bronchoscopy or CT-guided biopsy. He underwent a PET scan in October 2021 that showed low level metabolic activity suspicious for reactive changes but he did have intense uptake in the anorectal region. There was a lot of conversation with Dr. Marin Comment in GI in [facility] about rectal polyps that need to be further worked up but in the midst of this his cardiovascular conditions worsened and we were going to get him set up to see Dr. Marcello Moores in [place] but he no showed for her office and after looking more in depth at his case, she recommended he go to a tertiary medical center for care.  Regarding his lung nodule, we elected to follow this site.  We could not reach the patient on multiple occasions but he was finally able to have a repeat scan that was performed on 08/31/20 that showed persistence of this RLL nodule measuring 11 mm in greatest dimension. No adenopathy was present. persistent cardiovascular atherosclerosis was again seen. He also had a stable 2 mm LLL nodule, cholelithiasis and evidence of emphysema. He's contacted today to review these findings and next steps.      PREVIOUS RADIATION THERAPY: No   PAST MEDICAL HISTORY:  Past Medical History:  Diagnosis Date  . Arthritis    "knees, elbows" (03/18/2018)  . Chronic edema    a. Chronic RLE edema.  Marland Kitchen COPD (chronic obstructive pulmonary disease) ([place])   . Coronary artery disease    a. MI s/p balloon 1996, details unclear.  Marland Kitchen Dysrhythmia    PROXIMAL MARGIN FIBRILATION  .  GERD (gastroesophageal reflux disease)   . Hyperlipidemia   . Hypertension   . Myocardial infarction ([place]) 1996  . PAD (peripheral artery disease) ([place])    a. s/p stenting 08/2012, 02/2013.   Marland Kitchen PAF (paroxysmal atrial fibrillation) ([place])   . Sleep apnea    "have mask; have to start wearing it" (03/18/2018)  . Stroke [facility]) ~ 2014   denies residual on 03/18/2018  . Thrombosis of lower extremity    a. Listed on patient's medical  bracelet - at [place].  Marland Kitchen Type II diabetes mellitus ([place])        PAST SURGICAL HISTORY: Past Surgical History:  Procedure Laterality Date  . ABDOMINAL AORTOGRAM W/LOWER EXTREMITY N/A 07/25/2019   Procedure: ABDOMINAL AORTOGRAM W/LOWER EXTREMITY;  Surgeon: Waynetta Sandy, MD;  Location: [facility];  Service: Cardiovascular;  Laterality: N/A;  . ANGIOPLASTY Right 06/03/2020   Procedure: BALLOON ANGIOPLASTY POPITEAL ARTERY;  Surgeon: Cherre Robins, MD;  Location: [place];  Service: Vascular;  Laterality: Right;  . ANTERIOR LUMBAR FUSION  1981  . APPENDECTOMY    . APPLICATION OF WOUND VAC Right 01/09/2018   Procedure: APPLICATION OF WOUND VAC ON RIGHT LOWER LEG;  Surgeon: Conrad [place], MD;  Location: [place];  Service: Vascular;  Laterality: Right;  . BACK SURGERY    . CATARACT EXTRACTION W/ INTRAOCULAR LENS  IMPLANT, BILATERAL Bilateral   . CORONARY ANGIOPLASTY WITH STENT PLACEMENT     "I've got 1 stent in there" (03/18/2018)  . FASCIOTOMY Right 01/09/2018   Procedure: FASCIOTOMY RIGHT LOWER LEG;  Surgeon: Conrad [place], MD;  Location: [place];  Service: Vascular;  Laterality: Right;  . FASCIOTOMY Right 06/04/2020   Procedure: RIGHT LOWER EXTREMITY FASCIOTOMY;  Surgeon: Elam Dutch, MD;  Location: [place];  Service: Vascular;  Laterality: Right;  . FASCIOTOMY CLOSURE Right 01/11/2018   Procedure: FASCIOTOMY CLOSURE RIGHT CALF;  Surgeon: Conrad [place], MD;  Location: [place];  Service: Vascular;  Laterality: Right;  . FEMORAL-POPLITEAL BYPASS GRAFT Right 01/09/2018   Procedure: RIGHT FEMORAL-POPLITEAL ARTERY BYPASS USING PROPATEN 6MM X 80CM VASCULAR GRAFT;  Surgeon: Conrad [place], MD;  Location: [place];  Service: Vascular;  Laterality: Right;  . HEMORRHOID SURGERY    . LEFT HEART CATH AND CORONARY ANGIOGRAPHY N/A 03/19/2018   Procedure: LEFT HEART CATH AND CORONARY ANGIOGRAPHY;  Surgeon: Martinique, Peter M, MD;  Location: [place];  Service: Cardiovascular;  Laterality: N/A;  .  LOWER EXTREMITY ANGIOGRAM Right 06/02/2020   Procedure: RIGHT LOWER EXTREMITY ANGIOGRAM INTERVENTION;  Surgeon: Cherre Robins, MD;  Location: [place];  Service: Vascular;  Laterality: Right;  . LOWER EXTREMITY ANGIOGRAM Right 06/03/2020   Procedure: RIGHT LOWER EXTREMITY ANGIOGRAM from [place];  Surgeon: Cherre Robins, MD;  Location: [place];  Service: Vascular;  Laterality: Right;  . LOWER EXTREMITY ANGIOGRAPHY N/A 08/31/2019   Procedure: LOWER EXTREMITY ANGIOGRAPHY;  Surgeon: [name], MD;  Location: [facility] [facility];  Service: Cardiovascular;  Laterality: N/A;  . LOWER EXTREMITY ANGIOGRAPHY Right 01/18/2020   Procedure: LOWER EXTREMITY ANGIOGRAPHY;  Surgeon: [name], MD;  Location: [facility];  Service: Cardiovascular;  Laterality: Right;  . PATCH ANGIOPLASTY Right 01/09/2018   Procedure: PATCH ANGIOPLASTY USING Rueben Bash BIOLOGIC 1CM X 6CM PATCH;  Surgeon: Conrad [place], MD;  Location: [place];  Service: Vascular;  Laterality: Right;  . PERIPHERAL VASCULAR ATHERECTOMY Right 01/19/2020   Procedure: PERIPHERAL VASCULAR ATHERECTOMY;  Surgeon: Monica Martinez  J, MD;  Location: [place] [facility];  Service: Cardiovascular;  Laterality: Right;  Femoral popliteal and tibioperoneal arteries.  Marland Kitchen PERIPHERAL VASCULAR BALLOON ANGIOPLASTY Right 09/01/2019   Procedure: PERIPHERAL VASCULAR BALLOON ANGIOPLASTY;  Surgeon: Waynetta Sandy, MD;  Location: [facility];  Service: Cardiovascular;  Laterality: Right;  peroneal artery.  Marland Kitchen PERIPHERAL VASCULAR INTERVENTION Right 07/25/2019   Procedure: PERIPHERAL VASCULAR INTERVENTION;  Surgeon: Waynetta Sandy, MD;  Location: [place] [facility];  Service: Cardiovascular;  Laterality: Right;  SFA X 3  . PERIPHERAL VASCULAR INTERVENTION Right 09/01/2019   Procedure: PERIPHERAL VASCULAR INTERVENTION;  Surgeon: Waynetta Sandy, MD;  Location: [facility];  Service: Cardiovascular;  Laterality:  Right;  superficial femoral  . PERIPHERAL VASCULAR THROMBECTOMY Right 09/01/2019   Procedure: PERIPHERAL VASCULAR THROMBECTOMY;  Surgeon: Waynetta Sandy, MD;  Location: [place] [facility];  Service: Cardiovascular;  Laterality: Right;  . PERIPHERAL VASCULAR THROMBECTOMY Right 01/18/2020   Procedure: PERIPHERAL VASCULAR THROMBECTOMY;  Surgeon: [name], MD;  Location: [facility];  Service: Cardiovascular;  Laterality: Right;  . PERIPHERAL VASCULAR THROMBECTOMY N/A 01/19/2020   Procedure: LYSIS RECHECK;  Surgeon: [name], MD;  Location: [facility];  Service: Cardiovascular;  Laterality: N/A;  . POSTERIOR LUMBAR FUSION  1978  . THROMBECTOMY FEMORAL ARTERY Right 01/09/2018   Procedure: THROMBECTOMY RIGHT LOWER LEG;  Surgeon: Conrad [place], MD;  Location: [place];  Service: Vascular;  Laterality: Right;  . TONSILLECTOMY       FAMILY HISTORY:  Family History  Problem Relation Age of Onset  . CAD Mother   . CAD Brother   . Hypertension Other      SOCIAL HISTORY:  reports that he quit smoking about 26 years ago. He has a 90.00 pack-year smoking history. He has never used smokeless tobacco. He reports that he does not drink alcohol and does not use drugs. The patient is single and his daughter Sharyn Lull helps with his care. He lives in [place].    ALLERGIES: Oxycodone, Statins, Amiodarone, Donepezil, Imipramine, and Oxybutynin chloride   MEDICATIONS:  Current Outpatient Medications  Medication Sig Dispense Refill  . acetaminophen (TYLENOL) 500 MG tablet Take 500 mg by mouth 4 (four) times daily as needed (for pain or headaches).    Marland Kitchen apixaban (ELIQUIS) 2.5 MG TABS tablet Take 1 tablet (2.5 mg total) by mouth 2 (two) times daily. 30 tablet 3  . aspirin EC 81 MG EC tablet Take 1 tablet (81 mg total) by mouth daily. Swallow whole. 30 tablet 11  . atorvastatin (LIPITOR) 40 MG tablet Take 1 tablet (40 mg total) by mouth daily at 6 PM. 30 tablet 1  .  Cholecalciferol (VITAMIN D3) 50 MCG (2000 UT) TABS Take 2,000 Units by mouth in the morning.    . clopidogrel (PLAVIX) 75 MG tablet Take 1 tablet (75 mg total) by mouth daily. (Patient taking differently: Take 75 mg by mouth every evening.) 90 tablet 1  . diltiazem (CARDIZEM) 60 MG tablet Take 1 tablet (60 mg total) by mouth 2 (two) times daily. 60 tablet 1  . ezetimibe (ZETIA) 10 MG tablet Take 10 mg by mouth every evening.     . ferrous sulfate 325 (65 FE) MG tablet Take 325 mg by mouth every Monday, Wednesday, and Friday.    . finasteride (PROSCAR) 5 MG tablet Take 5 mg by mouth daily.    . hydrochlorothiazide (MICROZIDE) 12.5 MG capsule Take 12.5 mg by mouth  daily.    . ibuprofen (ADVIL) 200 MG tablet Take 400 mg by mouth every 6 (six) hours as needed (for headaches).    Marland Kitchen ketorolac (ACULAR) 0.5 % ophthalmic solution Place 1 drop into the right eye daily.    Marland Kitchen lidocaine (LIDODERM) 5 % Place 1 patch onto the skin daily as needed (for back pain- Remove & Discard patch within 12 hours or as directed by MD).    Marland Kitchen meclizine (ANTIVERT) 25 MG tablet Take 25 mg by mouth 3 (three) times daily as needed for dizziness.    . memantine (NAMENDA) 10 MG tablet Take 10 mg by mouth in the morning and at bedtime.    . metoprolol tartrate (LOPRESSOR) 25 MG tablet Take 1 tablet (25 mg total) by mouth 2 (two) times daily. 60 tablet 3  . metoprolol tartrate (LOPRESSOR) 50 MG tablet Take 50 mg by mouth 2 (two) times daily.    . nitroGLYCERIN (NITROSTAT) 0.4 MG SL tablet Place 0.4 mg under the tongue every 5 (five) minutes x 3 doses as needed for chest pain.    Marland Kitchen oxyCODONE-acetaminophen (PERCOCET/ROXICET) 5-325 MG tablet Take 1 tablet by mouth every 6 (six) hours as needed for moderate pain. (Patient not taking: Reported on 06/25/2020) 20 tablet 0  . pantoprazole (PROTONIX) 40 MG tablet Take 40 mg by mouth daily before breakfast.    . ramipril (ALTACE) 10 MG capsule Take 10 mg by mouth 2 (two) times daily.    .  rivastigmine (EXELON) 6 MG capsule Take 6 mg by mouth 2 (two) times daily.    . vitamin B-12 (CYANOCOBALAMIN) 500 MCG tablet Take 500 mcg by mouth daily.     No current facility-administered medications for this encounter.     REVIEW OF SYSTEMS: On review of systems, the patient reports that he is doing well overall. He's not having regular coughin, shortness of breath, fevers, or chills. No complaints are otherwise noted.     PHYSICAL EXAM:  Wt Readings from Last 3 Encounters:  06/25/20 170 lb (77.1 kg)  06/02/20 175 lb 11.3 oz (79.7 kg)  05/29/20 168 lb (76.2 kg)   Unable to assess due to encounter type.  ECOG = 1  0 - Asymptomatic (Fully active, able to carry on all predisease activities without restriction)  1 - Symptomatic but completely ambulatory (Restricted in physically strenuous activity but ambulatory and able to carry out work of a light or sedentary nature. For example, light housework, office work)  2 - Symptomatic, <50% in bed during the day (Ambulatory and capable of all self care but unable to carry out any work activities. Up and about more than 50% of waking hours)  3 - Symptomatic, >50% in bed, but not bedbound (Capable of only limited self-care, confined to bed or chair 50% or more of waking hours)  4 - Bedbound (Completely disabled. Cannot carry on any self-care. Totally confined to bed or chair)  5 - Death   Santiago Glad MM, Creech RH, Tormey DC, et al. [phone]). "Toxicity and response criteria of the [facility]". Am. Evlyn Clines. Oncol. 5 (6): 649-55    LABORATORY DATA:  Lab Results  Component Value Date   WBC 5.8 06/12/2020   HGB 8.2 (L) 06/12/2020   HCT 24.9 (L) 06/12/2020   MCV 94.0 06/12/2020   PLT 248 06/12/2020   Lab Results  Component Value Date   NA 138 06/08/2020   K 4.0 06/08/2020   CL 105 06/08/2020   CO2 24  06/08/2020   Lab Results  Component Value Date   ALT 83 (H) 05/18/2020   AST 61 (H) 05/18/2020   ALKPHOS 77  05/18/2020   BILITOT 0.4 05/18/2020      RADIOGRAPHY: CT CHEST W CONTRAST  Result Date: 09/01/2020 CLINICAL DATA:  Non-small cell lung cancer monitoring. 80 year old male diagnosed 6 months ago by report. EXAM: CT CHEST WITH CONTRAST TECHNIQUE: Multidetector CT imaging of the chest was performed during intravenous contrast administration. CONTRAST:  12m OMNIPAQUE IOHEXOL 300 MG/ML  SOLN COMPARISON:  February 22, 2020 FINDINGS: Cardiovascular: Calcified and noncalcified atheromatous plaque in the thoracic aorta. Mild cardiac enlargement. No pericardial effusion. Central pulmonary vasculature is normal caliber. Mediastinum/Nodes: Thoracic inlet structures are normal. No mediastinal lymphadenopathy. No hilar adenopathy. No axillary lymphadenopathy. Lungs/Pleura: Signs of pulmonary emphysema as before. Spiculated nodule in the medial RIGHT lung base (image 87, series 5) 11 x 8 mm. Previously 10 x 8 mm. This is in the azygoesophageal recess. Tiny 2 mm pulmonary nodule in the LEFT lung base on image 99 of series 5 is stable approximately 3 mm. No consolidation. No pleural effusion. No additional suspicious pulmonary nodule. Airways are patent. Upper Abdomen: Incidental imaging of upper abdominal contents without acute process. Cholelithiasis partially imaged. Imaged portions of the adrenal glands are normal. No upper abdominal adenopathy. Renal cortical scarring. Musculoskeletal: Spinal degenerative changes. No acute or destructive bone finding. IMPRESSION: 1. Spiculated pulmonary nodule in medial RIGHT lung base in the RIGHT lower lobe suspicious for bronchogenic neoplasm, referral to multi disciplinary thoracic oncologic setting with PET or biopsy as warranted, if not yet performed. 2. Stable 2 mm pulmonary nodule in the LEFT lung base. Attention on follow-up 3. No signs of nodal disease in the chest. 4. Cholelithiasis. 5. Emphysema and aortic atherosclerosis. Aortic Atherosclerosis (ICD10-I70.0) and Emphysema  (ICD10-J43.9). Electronically Signed   By: GZetta BillsM.D.   On: 09/01/2020 16:09       IMPRESSION/PLAN: 1. 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 We have been following a nodule in the RLL.His most recent imaging does show very minute change, only 1 mm, so overall I think this is favorable to follow with another interval. Dr. MLisbeth Renshawwould consider treating this if the lesion grew with subsequent imaging. We will plan repeat a CT chest in 3 months time. The patient and his daughter are in agreement with this plan. 2. History of rectal polyps. After speaking with the patient and his daughter today, it sounds like [facility] If he needed colorectal surgery to see him, it has been recommended [facility] given [facility]orb[facility] are in agreement. 3.  Cardiovascular disease. The patient will meet with cardiology for further management of this and we will follow this expectantly.  Given current concerns for patient exposure during the COVID-19 pandemic, this encounter was conducted via telephone.  The patient has provided two factor identification and has given verbal consent for this type of encounter and has been advised to only accept a meeting of this type in a secure network environment. The time spent during this encounter was 35 minutes including preparation, discussion, and coordination of the patient's care. The attendants for this meeting include  AHayden Pedro and TAllayne Butcherand MLyndee Hensen During the encounter,  AHayden Pedrowas located remotely at home. TGILDO CRISCOwas located at home as  well as his daughter Lyndee Hensen at her home.   In a visit lasting 60 minutes, greater than 50% of the time was spent face to face discussing the patient's condition, in preparation for the discussion, and coordinating the patient's care.   The  above documentation reflects my direct findings during this shared patient visit. Please see the separate note by Dr. Lisbeth Renshaw on this date for the remainder of the patient's plan of care.    Carola Rhine, PAC

## 2020-09-18 ENCOUNTER — Encounter: Payer: Self-pay | Admitting: Cardiology

## 2020-09-18 ENCOUNTER — Telehealth: Payer: Self-pay | Admitting: *Deleted

## 2020-09-18 ENCOUNTER — Ambulatory Visit (INDEPENDENT_AMBULATORY_CARE_PROVIDER_SITE_OTHER): Payer: No Typology Code available for payment source | Admitting: Cardiology

## 2020-09-18 ENCOUNTER — Other Ambulatory Visit: Payer: Self-pay

## 2020-09-18 VITALS — BP 140/64 | HR 56 | Ht 69.0 in | Wt 181.0 lb

## 2020-09-18 DIAGNOSIS — Z01812 Encounter for preprocedural laboratory examination: Secondary | ICD-10-CM | POA: Diagnosis not present

## 2020-09-18 DIAGNOSIS — I480 Paroxysmal atrial fibrillation: Secondary | ICD-10-CM | POA: Diagnosis not present

## 2020-09-18 DIAGNOSIS — I4819 Other persistent atrial fibrillation: Secondary | ICD-10-CM | POA: Diagnosis not present

## 2020-09-18 NOTE — Patient Instructions (Addendum)
Medication Instructions:  Your physician recommends that you continue on your current medications as directed. Please refer to the Current Medication list given to you today.  *If you need a refill on your cardiac medications before your next appointment, please call your pharmacy*   Lab Work: If you have labs (blood work) drawn today and your tests are completely normal, you will receive your results only by: Marland Kitchen MyChart Message (if you have MyChart) OR . A paper copy in the mail If you have any lab test that is abnormal or we need to change your treatment, we will call you to review the results.   Testing/Procedures: Your physician has requested that you have cardiac CT within 7 days PRIOR to your ablation. Cardiac computed tomography (CT) is a painless test that uses an x-ray machine to take clear, detailed pictures of your heart.  Please follow instruction below located under "other instructions". You will get a call from our office to schedule the date for this test.  Your physician has recommended that you have an ablation. Catheter ablation is a medical procedure used to treat some cardiac arrhythmias (irregular heartbeats). During catheter ablation, a long, thin, flexible tube is put into a blood vessel in your groin (upper thigh), or neck. This tube is called an ablation catheter. It is then guided to your heart through the blood vessel. Radio frequency waves destroy small areas of heart tissue where abnormal heartbeats may cause an arrhythmia to start. Please follow instruction below located under "other instructions".   Follow-Up: At [facility], you and your health needs are our priority.  As part of our continuing mission to provide you with exceptional heart care, we have created designated Provider Care Teams.  These Care Teams include your primary Cardiologist (physician) and Advanced Practice Providers (APPs -  Physician Assistants and Nurse Practitioners) who all work together  to provide you with the care you need, when you need it.  Your next appointment:   1 month(s) after your ablation  The format for your next appointment:   In Person  Provider:   AFib clinic   Thank you for choosing [facility]!!   Trinidad Curet, RN [phone]    Other Instructions   CT INSTRUCTIONS Your cardiac CT will be scheduled at:  [facility] [address] [place],  [address] [phone]   Please arrive at the [facility] main entrance of [facility] at ________________ on _________________, please arrive 30 minutes prior to test start time. Proceed to the [facility] Radiology Department (first floor) to check-in and test prep.  Please follow these instructions carefully (unless otherwise directed):  Hold all erectile dysfunction medications at least 3 days (72 hrs) prior to test.  On the Night Before the Test: . Be sure to Drink plenty of water. . Do not consume any caffeinated/decaffeinated beverages or chocolate 12 hours prior to your test. . Do not take any antihistamines 12 hours prior to your test.  On the Day of the Test: . Drink plenty of water. Do not drink any water within one hour of the test. . Do not eat any food 4 hours prior to the test. . You may take your regular medications prior to the test.  . Take metoprolol (Lopressor) two hours prior to test. . HOLD Furosemide/Hydrochlorothiazide morning of the test.      After the Test: . Drink plenty of water. . After receiving IV contrast, you may experience a mild flushed  feeling. This is normal. . On occasion, you may experience a mild rash up to 24 hours after the test. This is not dangerous. If this occurs, you can take Benadryl 25 mg and increase your fluid intake. . If you experience trouble breathing, this can be serious. If it is severe call 911 IMMEDIATELY. If it is mild, please call our office. . If you take any of these medications:  Glipizide/Metformin, Avandament, Glucavance, please do not take 48 hours after completing test unless otherwise instructed.   Once we have confirmed authorization from your insurance company, we will call you to set up a date and time for your test. Based on how quickly your insurance processes prior authorizations requests, please allow up to 4 weeks to be contacted for scheduling your Cardiac CT appointment. Be advised that routine Cardiac CT appointments could be scheduled as many as 8 weeks after your provider has ordered it.  For non-scheduling related questions, please contact the cardiac imaging nurse navigator should you have any questions/concerns: Marchia Bond, Cardiac Imaging Nurse Navigator Burley Saver, Interim Cardiac Imaging Nurse [place] [facility] Direct Office Dial: [phone]   For scheduling needs, including cancellations and rescheduling, please call Vivien Rota at [phone], option 3.      Electrophysiology/Ablation Procedure Instructions   You are scheduled for a(n)  ablation on 12/07/2020 with Dr. Allegra Lai.   1.   Pre procedure testing-             A.  LAB WORK --- On 11/21/2020  for your pre procedure blood work at the [facility].  You do NOT need to be fasting.               B. COVID TEST-- On 12/05/2020 @ 11:30 am -  This is a Drive Up Visit at [address] [place], [place] [address].  Someone will direct you to the appropriate testing line. Stay in your car and someone will be with you shortly.   After you are tested please go home and self quarantine until the day of your procedure.     2. On the day of your procedure 12/07/2020 you will go to [facility] [phone]) at 5:30 am .  Dennis Bast will go to the main entrance A [employer]) and enter where the [employer] are.  Your driver will drop you off and you will head down the hallway to ADMITTING.  You may have one support person come in to the hospital with  you.  They will be asked to wait in the waiting room. It is OK to have someone drop you off and come back when you are ready to be discharged.   3.   Do not eat or drink after midnight prior to your procedure.   4.   On the morning of your procedure do NOT take any medication. Do not miss any doses of your blood thinner prior to the morning of your procedure or your procedure will need to be rescheduled.   5.  Plan for an overnight stay but you may be discharged after your procedure, if you use your phone frequently bring your phone charger. If you are discharged after your procedure you will need someone to drive you home and be with you for 24 hours after your procedure.   6. You will follow up with the AFIB clinic 4 weeks after your procedure.  You will follow up with Dr. Curt Bears  3 months after your procedure.  These appointments will be made for you.   * If you have ANY questions please call the office [phone] and ask for Derry Arbogast RN or send me a MyChart message   * Occasionally, EP Studies and ablations can become lengthy.  Please make your family aware of this before your procedure starts.  Average time ranges from 2-8 hours for EP studies/ablations.  Your physician will call your family after the procedure with the results.                                     AFIB CLINIC INFORMATION: Your appointment is scheduled on: ____________ at ___________. Please arrive 15 minutes early for check-in. The AFib Clinic is located in the Heart and Vascular Specialty Clinics at [facility]. Parking instructions/directions: [address] (off [address]). When you pull in to Entrance C, there is an underground parking garage to your right. The code to enter the garage is _______________. Take the elevators to the first floor. Follow the signs to the Heart and Vascular Specialty Clinics. You will see registration at the end of the hallway.  Phone number: [phone]     Cardiac  Ablation Cardiac ablation is a procedure to destroy (ablate) some heart tissue that is sending bad signals. These bad signals cause problems in heart rhythm. The heart has many areas that make these signals. If there are problems in these areas, they can make the heart beat in a way that is not normal. Destroying some tissues can help make the heart rhythm normal. Tell your doctor about:  Any allergies you have.  All medicines you are taking. These include vitamins, herbs, eye drops, creams, and over-the-counter medicines.  Any problems you or family members have had with medicines that make you fall asleep (anesthetics).  Any blood disorders you have.  Any surgeries you have had.  Any medical conditions you have, such as kidney failure.  Whether you are pregnant or may be pregnant. What are the risks? This is a safe procedure. But problems may occur, including:  Infection.  Bruising and bleeding.  Bleeding into the chest.  Stroke or blood clots.  Damage to nearby areas of your body.  Allergies to medicines or dyes.  The need for a pacemaker if the normal system is damaged.  Failure of the procedure to treat the problem. What happens before the procedure? Medicines Ask your doctor about:  Changing or stopping your normal medicines. This is important.  Taking aspirin and ibuprofen. Do not take these medicines unless your doctor tells you to take them.  Taking other medicines, vitamins, herbs, and supplements. General instructions  Follow instructions from your doctor about what you cannot eat or drink.  Plan to have someone take you home from the hospital or clinic.  If you will be going home right after the procedure, plan to have someone with you for 24 hours.  Ask your doctor what steps will be taken to prevent infection. What happens during the procedure?  An IV tube will be put into one of your veins.  You will be given a medicine to help you  relax.  The skin on your neck or groin will be numbed.  A cut (incision) will be made in your neck or groin. A needle will be put through your cut and into a large vein.  A tube (catheter)  will be put into the needle. The tube will be moved to your heart.  Dye may be put through the tube. This helps your doctor see your heart.  Small devices (electrodes) on the tube will send out signals.  A type of energy will be used to destroy some heart tissue.  The tube will be taken out.  Pressure will be held on your cut. This helps stop bleeding.  A bandage will be put over your cut. The exact procedure may vary among doctors and hospitals.   What happens after the procedure?  You will be watched until you leave the hospital or clinic. This includes checking your heart rate, breathing rate, oxygen, and blood pressure.  Your cut will be watched for bleeding. You will need to lie still for a few hours.  Do not drive for 24 hours or as long as your doctor tells you. Summary  Cardiac ablation is a procedure to destroy some heart tissue. This is done to treat heart rhythm problems.  Tell your doctor about any medical conditions you may have. Tell him or her about all medicines you are taking to treat them.  This is a safe procedure. But problems may occur. These include infection, bruising, bleeding, and damage to nearby areas of your body.  Follow what your doctor tells you about food and drink. You may also be told to change or stop some of your medicines.  After the procedure, do not drive for 24 hours or as long as your doctor tells you. This information is not intended to replace advice given to you by your health care provider. Make sure you discuss any questions you have with your health care provider. Document Revised: 05/12/2019 Document Reviewed: 05/12/2019 Elsevier Patient Education  2021 [employer].

## 2020-09-18 NOTE — Progress Notes (Signed)
Electrophysiology Office Note   Date:  09/18/2020   ID:  Donald Gordon, Donald Gordon 12/04/1940, MRN [id number]  PCP:  [place]  Cardiologist: [facility] cardiology Primary Electrophysiologist:  Lizvette Lightsey Meredith Leeds, MD    Chief Complaint: Atrial fibrillation   History of Present Illness: Donald Gordon is a 80 y.o. male who is being seen today for the evaluation of atrial fibrillation at the request of [place]. Presenting today for electrophysiology evaluation.  He has a history significant for coronary artery disease status post MI and stenting in 1996, CVA, PAD status post femoropopliteal bypass in 2014, status post bilateral iliac stents and thrombolysis/thrombectomy graft occlusion in 2014, diabetes, hypertension, and atrial fibrillation diagnosed in 2014.  He was admitted in 2019 for a right leg clot status post embolectomy and fasciotomy and was started on Plavix.  He was then admitted September 2019 with rapid atrial fibrillation and a positive troponin.  Catheterization at that time showed non obstructive coronary artery disease.  His atrial fibrillation symptoms include palpitations, shortness of breath, and weakness.  He is currently on Eliquis.  He was initially on amiodarone, though this was stopped due to hyperthyroidism.  Today, he denies symptoms of palpitations, chest pain, shortness of breath, orthopnea, PND, lower extremity edema, claudication, dizziness, presyncope, syncope, bleeding, or neurologic sequela. The patient is tolerating medications without difficulties.  When he is in atrial fibrillation he has symptoms of fatigue, shortness of breath, chest pain.  Otherwise, he does have these symptoms chronically, but they do worsen when he is in atrial fibrillation.   Past Medical History:  Diagnosis Date  . Arthritis    "knees, elbows" (03/18/2018)  . Chronic edema    a. Chronic RLE edema.  Marland Kitchen COPD (chronic obstructive pulmonary disease) ([place])   . Coronary artery  disease    a. MI s/p balloon 1996, details unclear.  Marland Kitchen Dysrhythmia    PROXIMAL MARGIN FIBRILATION  . GERD (gastroesophageal reflux disease)   . Hyperlipidemia   . Hypertension   . Myocardial infarction ([place]) 1996  . PAD (peripheral artery disease) ([place])    a. s/p stenting 08/2012, 02/2013.   Marland Kitchen PAF (paroxysmal atrial fibrillation) ([place])   . Sleep apnea    "have mask; have to start wearing it" (03/18/2018)  . Stroke [facility]) ~ 2014   denies residual on 03/18/2018  . Thrombosis of lower extremity    a. Listed on patient's medical bracelet - at [place].  Marland Kitchen Type II diabetes mellitus ([place])    Past Surgical History:  Procedure Laterality Date  . ABDOMINAL AORTOGRAM W/LOWER EXTREMITY N/A 07/25/2019   Procedure: ABDOMINAL AORTOGRAM W/LOWER EXTREMITY;  Surgeon: Waynetta Sandy, MD;  Location: [place] CV [facility];  Service: Cardiovascular;  Laterality: N/A;  . ANGIOPLASTY Right 06/03/2020   Procedure: BALLOON ANGIOPLASTY POPITEAL ARTERY;  Surgeon: Cherre Robins, MD;  Location: [place];  Service: Vascular;  Laterality: Right;  . ANTERIOR LUMBAR FUSION  1981  . APPENDECTOMY    . APPLICATION OF WOUND VAC Right 01/09/2018   Procedure: APPLICATION OF WOUND VAC ON RIGHT LOWER LEG;  Surgeon: Conrad Brinsmade, MD;  Location: [place];  Service: Vascular;  Laterality: Right;  . BACK SURGERY    . CATARACT EXTRACTION W/ INTRAOCULAR LENS  IMPLANT, BILATERAL Bilateral   . CORONARY ANGIOPLASTY WITH STENT PLACEMENT     "I've got 1 stent in there" (03/18/2018)  . FASCIOTOMY Right 01/09/2018   Procedure: FASCIOTOMY RIGHT LOWER LEG;  Surgeon: Conrad [place],  MD;  Location: [place];  Service: Vascular;  Laterality: Right;  . FASCIOTOMY Right 06/04/2020   Procedure: RIGHT LOWER EXTREMITY FASCIOTOMY;  Surgeon: Elam Dutch, MD;  Location: [place];  Service: Vascular;  Laterality: Right;  . FASCIOTOMY CLOSURE Right 01/11/2018   Procedure: FASCIOTOMY CLOSURE RIGHT CALF;  Surgeon: Conrad [place], MD;  Location: [place];  Service:  Vascular;  Laterality: Right;  . FEMORAL-POPLITEAL BYPASS GRAFT Right 01/09/2018   Procedure: RIGHT FEMORAL-POPLITEAL ARTERY BYPASS USING PROPATEN 6MM X 80CM VASCULAR GRAFT;  Surgeon: Conrad [place], MD;  Location: [place];  Service: Vascular;  Laterality: Right;  . HEMORRHOID SURGERY    . LEFT HEART CATH AND CORONARY ANGIOGRAPHY N/A 03/19/2018   Procedure: LEFT HEART CATH AND CORONARY ANGIOGRAPHY;  Surgeon: Martinique, Peter M, MD;  Location: [employer] CV LAB;  Service: Cardiovascular;  Laterality: N/A;  . LOWER EXTREMITY ANGIOGRAM Right 06/02/2020   Procedure: RIGHT LOWER EXTREMITY ANGIOGRAM INTERVENTION;  Surgeon: Cherre Robins, MD;  Location: [place];  Service: Vascular;  Laterality: Right;  . LOWER EXTREMITY ANGIOGRAM Right 06/03/2020   Procedure: RIGHT LOWER EXTREMITY ANGIOGRAM from [place];  Surgeon: Cherre Robins, MD;  Location: [place];  Service: Vascular;  Laterality: Right;  . LOWER EXTREMITY ANGIOGRAPHY N/A 08/31/2019   Procedure: LOWER EXTREMITY ANGIOGRAPHY;  Surgeon: [name], MD;  Location: [place] CV [facility];  Service: Cardiovascular;  Laterality: N/A;  . LOWER EXTREMITY ANGIOGRAPHY Right 01/18/2020   Procedure: LOWER EXTREMITY ANGIOGRAPHY;  Surgeon: [name], MD;  Location: [place] CV LAB;  Service: Cardiovascular;  Laterality: Right;  . PATCH ANGIOPLASTY Right 01/09/2018   Procedure: PATCH ANGIOPLASTY USING Rueben Bash BIOLOGIC 1CM X 6CM PATCH;  Surgeon: Conrad [place], MD;  Location: [place];  Service: Vascular;  Laterality: Right;  . PERIPHERAL VASCULAR ATHERECTOMY Right 01/19/2020   Procedure: PERIPHERAL VASCULAR ATHERECTOMY;  Surgeon: [name], MD;  Location: [place] CV LAB;  Service: Cardiovascular;  Laterality: Right;  Femoral popliteal and tibioperoneal arteries.  Marland Kitchen PERIPHERAL VASCULAR BALLOON ANGIOPLASTY Right 09/01/2019   Procedure: PERIPHERAL VASCULAR BALLOON ANGIOPLASTY;  Surgeon: Waynetta Sandy, MD;  Location: [place] CV LAB;  Service: Cardiovascular;  Laterality: Right;  peroneal artery.  Marland Kitchen PERIPHERAL VASCULAR INTERVENTION Right 07/25/2019   Procedure: PERIPHERAL VASCULAR INTERVENTION;  Surgeon: Waynetta Sandy, MD;  Location: [place] CV LAB;  Service: Cardiovascular;  Laterality: Right;  SFA X 3  . PERIPHERAL VASCULAR INTERVENTION Right 09/01/2019   Procedure: PERIPHERAL VASCULAR INTERVENTION;  Surgeon: Waynetta Sandy, MD;  Location: [place] CV LAB;  Service: Cardiovascular;  Laterality: Right;  superficial femoral  . PERIPHERAL VASCULAR THROMBECTOMY Right 09/01/2019   Procedure: PERIPHERAL VASCULAR THROMBECTOMY;  Surgeon: Waynetta Sandy, MD;  Location: [place] CV LAB;  Service: Cardiovascular;  Laterality: Right;  . PERIPHERAL VASCULAR THROMBECTOMY Right 01/18/2020   Procedure: PERIPHERAL VASCULAR THROMBECTOMY;  Surgeon: [name], MD;  Location: [place] CV LAB;  Service: Cardiovascular;  Laterality: Right;  . PERIPHERAL VASCULAR THROMBECTOMY N/A 01/19/2020   Procedure: LYSIS RECHECK;  Surgeon: [name], MD;  Location: [place] CV [facility];  Service: Cardiovascular;  Laterality: N/A;  . POSTERIOR LUMBAR FUSION  1978  . THROMBECTOMY FEMORAL ARTERY Right 01/09/2018   Procedure: THROMBECTOMY RIGHT LOWER LEG;  Surgeon: Conrad [place], MD;  Location: [place];  Service: Vascular;  Laterality: Right;  . TONSILLECTOMY       Current Outpatient Medications  Medication Sig Dispense Refill  .  acetaminophen (TYLENOL) 500 MG tablet Take 500 mg by mouth 4 (four) times daily as needed (for pain or headaches).    Marland Kitchen apixaban (ELIQUIS) 2.5 MG TABS tablet Take 1 tablet (2.5 mg total) by mouth 2 (two) times daily. 30 tablet 3  . aspirin EC 81 MG EC tablet Take 1 tablet (81 mg total) by mouth daily. Swallow whole. 30 tablet 11  . atorvastatin (LIPITOR) 40 MG tablet Take 1 tablet (40 mg total) by mouth daily at 6 PM. 30 tablet 1  . Cholecalciferol (VITAMIN  D3) 50 MCG (2000 UT) TABS Take 2,000 Units by mouth in the morning.    . clopidogrel (PLAVIX) 75 MG tablet Take 1 tablet (75 mg total) by mouth daily. (Patient taking differently: Take 75 mg by mouth every evening.) 90 tablet 1  . diltiazem (CARDIZEM) 60 MG tablet Take 1 tablet (60 mg total) by mouth 2 (two) times daily. 60 tablet 1  . ezetimibe (ZETIA) 10 MG tablet Take 10 mg by mouth every evening.     . ferrous sulfate 325 (65 FE) MG tablet Take 325 mg by mouth every Monday, Wednesday, and Friday.    . finasteride (PROSCAR) 5 MG tablet Take 5 mg by mouth daily.    . hydrochlorothiazide (MICROZIDE) 12.5 MG capsule Take 12.5 mg by mouth daily.    Marland Kitchen ibuprofen (ADVIL) 200 MG tablet Take 400 mg by mouth every 6 (six) hours as needed (for headaches).    Marland Kitchen ketorolac (ACULAR) 0.5 % ophthalmic solution Place 1 drop into the right eye daily.    Marland Kitchen lidocaine (LIDODERM) 5 % Place 1 patch onto the skin daily as needed (for back pain- Remove & Discard patch within 12 hours or as directed by MD).    Marland Kitchen meclizine (ANTIVERT) 25 MG tablet Take 25 mg by mouth 3 (three) times daily as needed for dizziness.    . memantine (NAMENDA) 10 MG tablet Take 10 mg by mouth in the morning and at bedtime.    . metoprolol tartrate (LOPRESSOR) 25 MG tablet Take 1 tablet (25 mg total) by mouth 2 (two) times daily. 60 tablet 3  . metoprolol tartrate (LOPRESSOR) 50 MG tablet Take 50 mg by mouth 2 (two) times daily.    . nitroGLYCERIN (NITROSTAT) 0.4 MG SL tablet Place 0.4 mg under the tongue every 5 (five) minutes x 3 doses as needed for chest pain.    Marland Kitchen oxyCODONE-acetaminophen (PERCOCET/ROXICET) 5-325 MG tablet Take 1 tablet by mouth every 6 (six) hours as needed for moderate pain. 20 tablet 0  . pantoprazole (PROTONIX) 40 MG tablet Take 40 mg by mouth daily before breakfast.    . ramipril (ALTACE) 10 MG capsule Take 10 mg by mouth 2 (two) times daily.    . rivastigmine (EXELON) 6 MG capsule Take 6 mg by mouth 2 (two) times daily.     . vitamin B-12 (CYANOCOBALAMIN) 500 MCG tablet Take 500 mcg by mouth daily.     No current facility-administered medications for this visit.    Allergies:   Oxycodone, Statins, Amiodarone, Donepezil, Imipramine, and Oxybutynin chloride   Social History:  The patient  reports that he quit smoking about 26 years ago. He has a 90.00 pack-year smoking history. He has never used smokeless tobacco. He reports that he does not drink alcohol and does not use drugs.   Family History:  The patient's family history includes CAD in his brother and mother; Hypertension in an other family member.    ROS:  Please see the history of present illness.   Otherwise, review of systems is positive for none.   All other systems are reviewed and negative.    PHYSICAL EXAM: VS:  BP 140/64   Pulse (!) 56   Ht 5\' 9"  (1.753 m)   Wt 181 lb (82.1 kg)   BMI 26.73 kg/m  , BMI Body mass index is 26.73 kg/m. GEN: Well nourished, well developed, in no acute distress  HEENT: normal  Neck: no JVD, carotid bruits, or masses Cardiac: RRR; no murmurs, rubs, or gallops,no edema  Respiratory:  clear to auscultation bilaterally, normal work of breathing GI: soft, nontender, nondistended, + BS MS: no deformity or atrophy  Skin: warm and dry Neuro:  Strength and sensation are intact Psych: euthymic mood, full affect  EKG:  EKG is ordered today. Personal review of the ekg ordered shows sinus rhythm, rate 56  Recent Labs: 01/21/2020: TSH 6.863 05/18/2020: ALT 83; B Natriuretic Peptide 377.4 05/21/2020: Magnesium 1.8 06/08/2020: Potassium 4.0; Sodium 138 06/12/2020: Hemoglobin 8.2; Platelets 248 08/31/2020: BUN 24; Creatinine 1.51    Lipid Panel     Component Value Date/Time   CHOL 125 05/19/2020 0427   TRIG 127 05/19/2020 0427   HDL 29 (L) 05/19/2020 0427   CHOLHDL 4.3 05/19/2020 0427   VLDL 25 05/19/2020 0427   LDLCALC 71 05/19/2020 0427     Wt Readings from Last 3 Encounters:  09/18/20 181 lb (82.1 kg)   06/25/20 170 lb (77.1 kg)  06/02/20 175 lb 11.3 oz (79.7 kg)      Other studies Reviewed: Additional studies/ records that were reviewed today include: TTE 05/19/20  Review of the above records today demonstrates:  1. Left ventricular ejection fraction, by estimation, is 60 to 65%. The  left ventricle has normal function. The left ventricle has no regional  wall motion abnormalities. Left ventricular diastolic parameters are  consistent with Grade II diastolic  dysfunction (pseudonormalization). Elevated left atrial pressure.  2. Right ventricular systolic function is normal. The right ventricular  size is normal. There is normal pulmonary artery systolic pressure. The  estimated right ventricular systolic pressure is 66.0 mmHg.  3. Left atrial size was mild to moderately dilated.  4. Right atrial size was mildly dilated.  5. The mitral valve is normal in structure. Trivial mitral valve  regurgitation. No evidence of mitral stenosis.  6. The aortic valve is normal in structure. Aortic valve regurgitation is  trivial. Mild aortic valve sclerosis is present, with no evidence of  aortic valve stenosis.  7. The inferior vena cava is normal in size with greater than 50%  respiratory variability, suggesting right atrial pressure of 3 mmHg.    ASSESSMENT AND PLAN:  1.  Paroxysmal atrial fibrillation: Currently on Eliquis.  CHA2DS2-VASc of at least 4.  He has episodes of atrial fibrillation that are symptomatic with shortness of breath, fatigue, and chest discomfort.  We did discuss further options of therapy including ablation versus dofetilide.  He has already failed amiodarone.  At this point, he would prefer ablation.  Risks and benefits were discussed which include bleeding, tamponade, heart block, stroke, damage to chest organs.  He understands these risks and has agreed to the procedure.  Of note he is on 2.5 mg of Eliquis.  This dose may need to be increased to be therapeutic.   We do not have an ECG of his atrial fibrillation.    2.  Coronary artery disease: Nonobstructive coronary artery disease on most recent  catheterization.  Currently on Plavix.  Plan per [facility] cardiology.  3.  Hypertension: Mildly elevated today.  Plan per primary cardiology.  4.  Peripheral arterial disease: Multiple stents placed in the past.  Also has had a femoropopliteal bypass.  Plan per vascular surgery.  Current medicines are reviewed at length with the patient today.   The patient does not have concerns regarding his medicines.  The following changes were made today:  none  Labs/ tests ordered today include:  Orders Placed This Encounter  Procedures  . CT CARDIAC MORPH/PULM VEIN W/CM&W/O CA SCORE  . Basic metabolic panel  . CBC  . EKG 12-Lead     Disposition:   FU with Tivon Lemoine 3 months  Signed, Krystelle Prashad Meredith Leeds, MD  09/18/2020 1:29 PM     [address] [place] [address] [phone] (office) [phone] (fax)

## 2020-09-18 NOTE — Telephone Encounter (Signed)
RETURNED PATIENT'S PHONE CALL, SPOKE WITH PATIENT. ?

## 2020-09-24 ENCOUNTER — Institutional Professional Consult (permissible substitution): Payer: No Typology Code available for payment source | Admitting: Cardiology

## 2020-10-13 DIAGNOSIS — S81052A Open bite, left knee, initial encounter: Secondary | ICD-10-CM | POA: Diagnosis not present

## 2020-10-13 DIAGNOSIS — S81852A Open bite, left lower leg, initial encounter: Secondary | ICD-10-CM | POA: Diagnosis not present

## 2020-10-13 DIAGNOSIS — I708 Atherosclerosis of other arteries: Secondary | ICD-10-CM | POA: Diagnosis not present

## 2020-10-25 ENCOUNTER — Telehealth: Payer: Self-pay

## 2020-10-25 ENCOUNTER — Other Ambulatory Visit: Payer: Self-pay

## 2020-10-25 MED ORDER — ENOXAPARIN SODIUM 80 MG/0.8ML IJ SOSY: 1.0000 mg/kg | PREFILLED_SYRINGE | Freq: Two times a day (BID) | INTRAMUSCULAR | 0 refills | Status: DC

## 2020-10-25 NOTE — Telephone Encounter (Addendum)
Dr. Belva Agee from the [place] called - she would like patient off anticoagulation for 3 days. Spoke with Dr. Carlis Abbott - advised Dr. Belva Agee that patient could stop anticoagulation for 3 days but would require bridging with lovenox. Have sent in RX for 3 days supply of Lovenox 1 mg/kg BID.  Received call from Pharmacy that patient was unclear on Lovenox RX and who was going to provide him education. I reached out to Dr. Belva Agee [phone]) and she said to cancel the prescription - that they did have a date yet for colonoscopy and they would call in the prescription and provide education when it was time. Advised I would cancel it, but please make sure he was bridged. Called pharmacy and informed.

## 2020-11-13 ENCOUNTER — Telehealth: Payer: Self-pay

## 2020-11-13 NOTE — Telephone Encounter (Signed)
Spoke with the pts daughter, Sharyn Lull, and she verbalized understanding for the pt to have a TEE the morning of this Ablation 12/07/20.

## 2020-11-16 ENCOUNTER — Other Ambulatory Visit: Payer: Self-pay | Admitting: Cardiology

## 2020-11-16 ENCOUNTER — Other Ambulatory Visit (HOSPITAL_COMMUNITY): Payer: Self-pay | Admitting: Cardiology

## 2020-11-16 DIAGNOSIS — I4891 Unspecified atrial fibrillation: Secondary | ICD-10-CM

## 2020-11-28 NOTE — Telephone Encounter (Signed)
Left detailed message informing her that I would send information via mychart. Aware covid testing no longer required and appt will be cancelled. Aware I resend instructions via [employer]

## 2020-11-28 NOTE — Telephone Encounter (Signed)
Pt's daughter Sharyn Lull is returning a call to [place], she said it's okay to leave her a message on her cellphone or send a message through [place]. Please advise

## 2020-11-29 ENCOUNTER — Telehealth (HOSPITAL_COMMUNITY): Payer: Self-pay | Admitting: Emergency Medicine

## 2020-11-29 DIAGNOSIS — Z01812 Encounter for preprocedural laboratory examination: Secondary | ICD-10-CM

## 2020-11-29 DIAGNOSIS — I480 Paroxysmal atrial fibrillation: Secondary | ICD-10-CM

## 2020-11-29 NOTE — Telephone Encounter (Signed)
Daughter returning my phone call upset and confused about what the plan is for the patients upcoming ablation.   He was transitioned to have TEE for ablation in the setting of contrast shortage. But his CTA was scheduled for tomorrow 6/10.   Since both were still ordered and scheduled, I was not sure which was correct. She was under the impression that he was still having TEE prior to ablation.  Because the patient was not able to get lab work today in time for CTA tomorrow, I am canceling CTA and he will proceed with TEE as his planned and as is scheduled.   Please reach out to the daughter if there are further instructions or concerns.  Marchia Bond RN Navigator Cardiac Imaging [facility] [phone] Office  [phone] Cell

## 2020-11-29 NOTE — Telephone Encounter (Signed)
Attempted to call patient regarding upcoming cardiac CT appointment. °Left message on voicemail with name and callback number °Nathaniel Wakeley RN Navigator Cardiac Imaging °[facility] °[phone] Office °[phone] Cell ° °

## 2020-11-30 ENCOUNTER — Ambulatory Visit (HOSPITAL_COMMUNITY): Payer: No Typology Code available for payment source

## 2020-11-30 NOTE — Telephone Encounter (Signed)
Spoke to dtr Discussed the confusion of CT vs TEE.  Decision is to proceed with planned TEE & Ablation same day, 6/17. Pt will stop by the [place] office early next week for blood work.  Aware I will send TEE & Ablation instructions via mychart. Dtr appreciates my return call and agreeable to plan.

## 2020-11-30 NOTE — Addendum Note (Signed)
Addended by: Stanton Kidney on: 11/30/2020 06:08 PM   Modules accepted: Orders

## 2020-11-30 NOTE — Telephone Encounter (Signed)
Called and spoke to dtr. See telephone note started yesterday for further documentation on this matter.

## 2020-12-05 ENCOUNTER — Other Ambulatory Visit (HOSPITAL_COMMUNITY): Payer: No Typology Code available for payment source

## 2020-12-05 DIAGNOSIS — I480 Paroxysmal atrial fibrillation: Secondary | ICD-10-CM | POA: Diagnosis not present

## 2020-12-05 DIAGNOSIS — Z01812 Encounter for preprocedural laboratory examination: Secondary | ICD-10-CM | POA: Diagnosis not present

## 2020-12-06 LAB — CBC
Hematocrit: 35.5 % — ABNORMAL LOW (ref 37.5–51.0)
Hemoglobin: 11 g/dL — ABNORMAL LOW (ref 13.0–17.7)
MCH: 26.4 pg — ABNORMAL LOW (ref 26.6–33.0)
MCHC: 31 g/dL — ABNORMAL LOW (ref 31.5–35.7)
MCV: 85 fL (ref 79–97)
Platelets: 254 10*3/uL (ref 150–450)
RBC: 4.17 x10E6/uL (ref 4.14–5.80)
RDW: 20.4 % — ABNORMAL HIGH (ref 11.6–15.4)
WBC: 5.9 10*3/uL (ref 3.4–10.8)

## 2020-12-06 LAB — BASIC METABOLIC PANEL
BUN/Creatinine Ratio: 17 (ref 10–24)
BUN: 25 mg/dL (ref 8–27)
CO2: 20 mmol/L (ref 20–29)
Calcium: 9.3 mg/dL (ref 8.6–10.2)
Chloride: 103 mmol/L (ref 96–106)
Creatinine, Ser: 1.51 mg/dL — ABNORMAL HIGH (ref 0.76–1.27)
Glucose: 132 mg/dL — ABNORMAL HIGH (ref 65–99)
Potassium: 4.3 mmol/L (ref 3.5–5.2)
Sodium: 139 mmol/L (ref 134–144)
eGFR: 46 mL/min/{1.73_m2} — ABNORMAL LOW (ref >59)

## 2020-12-06 NOTE — Pre-Procedure Instructions (Signed)
Instructed patient on the following items: Arrival time 0730, scheduled for TEE @ 0830 Nothing to eat or drink after midnight No meds AM of procedure Responsible person to drive you home and stay with you for 24 hrs  Have you missed any doses of anti-coagulant Eliquis- hasn't missed any doses

## 2020-12-07 ENCOUNTER — Ambulatory Visit (HOSPITAL_COMMUNITY): Payer: No Typology Code available for payment source | Admitting: Anesthesiology

## 2020-12-07 ENCOUNTER — Encounter (HOSPITAL_COMMUNITY)
Admission: RE | Disposition: A | Discharge status: Home / Self Care | Payer: Self-pay | Source: Home / Self Care | Attending: Internal Medicine

## 2020-12-07 ENCOUNTER — Other Ambulatory Visit: Payer: Self-pay

## 2020-12-07 ENCOUNTER — Encounter (HOSPITAL_COMMUNITY): Payer: Self-pay | Admitting: Internal Medicine

## 2020-12-07 ENCOUNTER — Ambulatory Visit (HOSPITAL_BASED_OUTPATIENT_CLINIC_OR_DEPARTMENT_OTHER)
Admission: RE | Admit: 2020-12-07 | Discharge: 2020-12-07 | Disposition: A | Discharge status: Home / Self Care | Payer: No Typology Code available for payment source | Source: Ambulatory Visit | Attending: Internal Medicine | Admitting: Internal Medicine

## 2020-12-07 ENCOUNTER — Ambulatory Visit (HOSPITAL_COMMUNITY)
Admission: RE | Admit: 2020-12-07 | Discharge: 2020-12-07 | Disposition: A | Discharge status: Home / Self Care | Payer: No Typology Code available for payment source | Attending: Internal Medicine | Admitting: Internal Medicine

## 2020-12-07 DIAGNOSIS — Z79899 Other long term (current) drug therapy: Secondary | ICD-10-CM | POA: Diagnosis not present

## 2020-12-07 DIAGNOSIS — I480 Paroxysmal atrial fibrillation: Secondary | ICD-10-CM

## 2020-12-07 DIAGNOSIS — Z87891 Personal history of nicotine dependence: Secondary | ICD-10-CM | POA: Insufficient documentation

## 2020-12-07 DIAGNOSIS — I2510 Atherosclerotic heart disease of native coronary artery without angina pectoris: Secondary | ICD-10-CM | POA: Diagnosis not present

## 2020-12-07 DIAGNOSIS — E119 Type 2 diabetes mellitus without complications: Secondary | ICD-10-CM | POA: Insufficient documentation

## 2020-12-07 DIAGNOSIS — Z7982 Long term (current) use of aspirin: Secondary | ICD-10-CM | POA: Insufficient documentation

## 2020-12-07 DIAGNOSIS — Z7901 Long term (current) use of anticoagulants: Secondary | ICD-10-CM | POA: Insufficient documentation

## 2020-12-07 DIAGNOSIS — Z885 Allergy status to narcotic agent status: Secondary | ICD-10-CM | POA: Insufficient documentation

## 2020-12-07 DIAGNOSIS — Z888 Allergy status to other drugs, medicaments and biological substances status: Secondary | ICD-10-CM | POA: Diagnosis not present

## 2020-12-07 DIAGNOSIS — I4891 Unspecified atrial fibrillation: Secondary | ICD-10-CM

## 2020-12-07 DIAGNOSIS — I10 Essential (primary) hypertension: Secondary | ICD-10-CM | POA: Diagnosis not present

## 2020-12-07 HISTORY — PX: ATRIAL FIBRILLATION ABLATION: EP1191

## 2020-12-07 HISTORY — PX: TEE WITHOUT CARDIOVERSION: SHX5443

## 2020-12-07 HISTORY — PX: BUBBLE STUDY: SHX6837

## 2020-12-07 LAB — GLUCOSE, CAPILLARY
Glucose-Capillary: 93 mg/dL (ref 70–99)
Glucose-Capillary: 125 mg/dL — ABNORMAL HIGH (ref 70–99)

## 2020-12-07 LAB — POCT ACTIVATED CLOTTING TIME
Activated Clotting Time: 376 seconds
Activated Clotting Time: 387 seconds

## 2020-12-07 SURGERY — ECHOCARDIOGRAM, TRANSESOPHAGEAL
Anesthesia: Monitor Anesthesia Care

## 2020-12-07 SURGERY — ATRIAL FIBRILLATION ABLATION
Anesthesia: General

## 2020-12-07 MED ORDER — DOBUTAMINE IN D5W 4-5 MG/ML-% IV SOLN
INTRAVENOUS | Status: DC | PRN
  Administered 2020-12-07: 20 ug/kg/min via INTRAVENOUS

## 2020-12-07 MED ORDER — ONDANSETRON HCL 4 MG/2ML IJ SOLN
INTRAMUSCULAR | Status: DC | PRN
  Administered 2020-12-07: 4 mg via INTRAVENOUS

## 2020-12-07 MED ORDER — ONDANSETRON HCL 4 MG/2ML IJ SOLN: 4.0000 mg | Freq: Four times a day (QID) | INTRAMUSCULAR | Status: DC | PRN

## 2020-12-07 MED ORDER — PHENYLEPHRINE HCL-NACL 10-0.9 MG/250ML-% IV SOLN
INTRAVENOUS | Status: DC | PRN
  Administered 2020-12-07: 50 ug/min via INTRAVENOUS

## 2020-12-07 MED ORDER — ROCURONIUM BROMIDE 10 MG/ML (PF) SYRINGE
PREFILLED_SYRINGE | INTRAVENOUS | Status: DC | PRN
  Administered 2020-12-07: 50 mg via INTRAVENOUS

## 2020-12-07 MED ORDER — PROPOFOL 10 MG/ML IV BOLUS
INTRAVENOUS | Status: DC | PRN
  Administered 2020-12-07 (×2): 50 mg via INTRAVENOUS
  Administered 2020-12-07: 100 mg via INTRAVENOUS

## 2020-12-07 MED ORDER — HEPARIN (PORCINE) IN NACL 1000-0.9 UT/500ML-% IV SOLN
INTRAVENOUS | Status: DC | PRN
  Administered 2020-12-07 (×5): 500 mL

## 2020-12-07 MED ORDER — BUTAMBEN-TETRACAINE-BENZOCAINE 2-2-14 % EX AERO
INHALATION_SPRAY | CUTANEOUS | Status: DC | PRN
  Administered 2020-12-07: 2 via TOPICAL

## 2020-12-07 MED ORDER — LIDOCAINE 2% (20 MG/ML) 5 ML SYRINGE
INTRAMUSCULAR | Status: DC | PRN
  Administered 2020-12-07: 80 mg via INTRAVENOUS

## 2020-12-07 MED ORDER — LACTATED RINGERS IV SOLN: INTRAVENOUS | Status: DC

## 2020-12-07 MED ORDER — PROPOFOL 500 MG/50ML IV EMUL
INTRAVENOUS | Status: DC | PRN
  Administered 2020-12-07: 150 ug/kg/min via INTRAVENOUS

## 2020-12-07 MED ORDER — SUGAMMADEX SODIUM 200 MG/2ML IV SOLN
INTRAVENOUS | Status: DC | PRN
  Administered 2020-12-07: 200 mg via INTRAVENOUS

## 2020-12-07 MED ORDER — DOBUTAMINE IN D5W 4-5 MG/ML-% IV SOLN
INTRAVENOUS | Status: AC
  Filled 2020-12-07: qty 250

## 2020-12-07 MED ORDER — FENTANYL CITRATE (PF) 250 MCG/5ML IJ SOLN
INTRAMUSCULAR | Status: DC | PRN
  Administered 2020-12-07 (×2): 50 ug via INTRAVENOUS

## 2020-12-07 MED ORDER — PROPOFOL 10 MG/ML IV BOLUS
INTRAVENOUS | Status: DC | PRN
  Administered 2020-12-07: 15 mg via INTRAVENOUS

## 2020-12-07 MED ORDER — SODIUM CHLORIDE 0.9 % IV SOLN: 250.0000 mL | INTRAVENOUS | Status: DC | PRN

## 2020-12-07 MED ORDER — SODIUM CHLORIDE 0.9 % IV SOLN: INTRAVENOUS | Status: DC

## 2020-12-07 MED ORDER — DEXAMETHASONE SODIUM PHOSPHATE 10 MG/ML IJ SOLN
INTRAMUSCULAR | Status: DC | PRN
  Administered 2020-12-07: 5 mg via INTRAVENOUS

## 2020-12-07 MED ORDER — SODIUM CHLORIDE 0.9% FLUSH: 3.0000 mL | INTRAVENOUS | Status: DC | PRN

## 2020-12-07 MED ORDER — HEPARIN SODIUM (PORCINE) 1000 UNIT/ML IJ SOLN
INTRAMUSCULAR | Status: DC | PRN
  Administered 2020-12-07: 1000 [IU] via INTRAVENOUS

## 2020-12-07 MED ORDER — HEPARIN SODIUM (PORCINE) 1000 UNIT/ML IJ SOLN
INTRAMUSCULAR | Status: DC | PRN
  Administered 2020-12-07: 14000 [IU] via INTRAVENOUS

## 2020-12-07 MED ORDER — GLYCOPYRROLATE PF 0.2 MG/ML IJ SOSY
PREFILLED_SYRINGE | INTRAMUSCULAR | Status: DC | PRN
  Administered 2020-12-07: .1 mg via INTRAVENOUS

## 2020-12-07 MED ORDER — PHENYLEPHRINE 40 MCG/ML (10ML) SYRINGE FOR IV PUSH (FOR BLOOD PRESSURE SUPPORT)
PREFILLED_SYRINGE | INTRAVENOUS | Status: DC | PRN
  Administered 2020-12-07: 160 ug via INTRAVENOUS

## 2020-12-07 MED ORDER — PROTAMINE SULFATE 10 MG/ML IV SOLN
INTRAVENOUS | Status: DC | PRN
  Administered 2020-12-07: 40 mg via INTRAVENOUS
  Administered 2020-12-07: 30 mg via INTRAVENOUS

## 2020-12-07 MED ORDER — SODIUM CHLORIDE 0.9% FLUSH: 3.0000 mL | Freq: Two times a day (BID) | INTRAVENOUS | Status: DC

## 2020-12-07 MED ORDER — ACETAMINOPHEN 325 MG PO TABS
650.0000 mg | ORAL_TABLET | ORAL | Status: DC | PRN
  Administered 2020-12-07: 650 mg via ORAL
  Filled 2020-12-07 (×2): qty 2

## 2020-12-07 SURGICAL SUPPLY — 21 items
BAG SNAP BAND KOVER 36X36 (MISCELLANEOUS) ×3 IMPLANT
BLANKET WARM UNDERBOD FULL ACC (MISCELLANEOUS) ×3 IMPLANT
CATH 8FR REPROCESSED SOUNDSTAR (CATHETERS) ×3 IMPLANT
CATH MAPPNG PENTARAY F 2-6-2MM (CATHETERS) ×1 IMPLANT
CATH S CIRCA THERM PROBE 10F (CATHETERS) ×3 IMPLANT
CATH SMTCH THERMOCOOL SF DF (CATHETERS) ×3 IMPLANT
CATH WEB BI DIR CSDF CRV REPRO (CATHETERS) ×3 IMPLANT
CLOSURE PERCLOSE PROSTYLE (VASCULAR PRODUCTS) ×12 IMPLANT
COVER SWIFTLINK CONNECTOR (BAG) ×3 IMPLANT
KIT VERSACROSS STEERABLE D1 (CATHETERS) ×3 IMPLANT
PACK EP LATEX FREE (CUSTOM PROCEDURE TRAY) ×3
PACK EP LF (CUSTOM PROCEDURE TRAY) ×1 IMPLANT
PAD PRO RADIOLUCENT 2001M-C (PAD) ×3 IMPLANT
PATCH CARTO3 (PAD) ×3 IMPLANT
PENTARAY F 2-6-2MM (CATHETERS) ×3
SHEATH CARTO VIZIGO SM CVD (SHEATH) ×3 IMPLANT
SHEATH PINNACLE 7F 10CM (SHEATH) ×6 IMPLANT
SHEATH PINNACLE 8F 10CM (SHEATH) ×3 IMPLANT
SHEATH PINNACLE 9F 10CM (SHEATH) ×3 IMPLANT
SHEATH PROBE COVER 6X72 (BAG) ×3 IMPLANT
TUBING SMART ABLATE COOLFLOW (TUBING) ×3 IMPLANT

## 2020-12-07 NOTE — Anesthesia Procedure Notes (Signed)
Procedure Name: Intubation Date/Time: 12/07/2020 10:24 AM Performed by: Rande Brunt, CRNA Pre-anesthesia Checklist: Patient identified, Emergency Drugs available, Suction available and Patient being monitored Patient Re-evaluated:Patient Re-evaluated prior to induction Oxygen Delivery Method: Circle System Utilized Preoxygenation: Pre-oxygenation with 100% oxygen Induction Type: IV induction Ventilation: Mask ventilation without difficulty and Oral airway inserted - appropriate to patient size Laryngoscope Size: Sabra Heck and 3 Grade View: Grade I Tube type: Oral Number of attempts: 1 Airway Equipment and Method: Stylet and Oral airway Placement Confirmation: ETT inserted through vocal cords under direct vision, positive ETCO2 and breath sounds checked- equal and bilateral Secured at: 21 cm Tube secured with: Tape Dental Injury: Teeth and Oropharynx as per pre-operative assessment

## 2020-12-07 NOTE — Anesthesia Preprocedure Evaluation (Addendum)
Anesthesia Evaluation  Patient identified by MRN, date of birth, ID band Patient awake    Reviewed: Allergy & Precautions, NPO status , Patient's Chart, lab work & pertinent test results, reviewed documented beta blocker date and time   Airway Mallampati: II  TM Distance: >3 FB Neck ROM: Full    Dental  (+) Upper Dentures, Lower Dentures   Pulmonary sleep apnea and Continuous Positive Airway Pressure Ventilation , COPD, former smoker,    Pulmonary exam normal breath sounds clear to auscultation       Cardiovascular hypertension, Pt. on medications and Pt. on home beta blockers + angina with exertion + CAD, + Past MI, + Peripheral Vascular Disease and +CHF  + dysrhythmias Atrial Fibrillation  Rhythm:Irregular Rate:Normal  MI 1996, right fem/pop bypass 07/21   Neuro/Psych CVA negative psych ROS   GI/Hepatic Neg liver ROS, GERD  Medicated,  Endo/Other  diabetes, Well Controlled, Type 2Hyperlipidemia  Renal/GU Renal disease  negative genitourinary   Musculoskeletal  (+) Arthritis , Osteoarthritis,    Abdominal (+)  Abdomen: soft. Bowel sounds: normal.  Peds  Hematology Lovenox therapy Eliquis therapy- last dose this am   Anesthesia Other Findings   Reproductive/Obstetrics                            Anesthesia Physical  Anesthesia Plan  ASA: 3  Anesthesia Plan: MAC   Post-op Pain Management:    Induction: Intravenous  PONV Risk Score and Plan: 1  Airway Management Planned: Simple Face Mask, Natural Airway and Nasal Cannula  Additional Equipment: Arterial line  Intra-op Plan:   Post-operative Plan:   Informed Consent: I have reviewed the patients History and Physical, chart, labs and discussed the procedure including the risks, benefits and alternatives for the proposed anesthesia with the patient or authorized representative who has indicated his/her understanding and acceptance.      Dental advisory given  Plan Discussed with: CRNA and Anesthesiologist  Anesthesia Plan Comments:         Anesthesia Quick Evaluation

## 2020-12-07 NOTE — H&P (Signed)
Electrophysiology Office Note   Date:  12/07/2020   ID:  Donald Gordon, Donald Gordon May 10, 1941, MRN [id number]  PCP:  [facility]  Cardiologist: [facility] cardiology Primary Electrophysiologist:  Faron Whitelock Meredith Leeds, MD    Chief Complaint: Atrial fibrillation   History of Present Illness: Donald Gordon is a 80 y.o. male who is being seen today for the evaluation of atrial fibrillation at the request of No ref. provider found. Presenting today for electrophysiology evaluation.  He has a history significant for coronary artery disease status post MI and stenting in 1996, CVA, PAD status post femoropopliteal bypass in 2014, status post bilateral iliac stents and thrombolysis/thrombectomy graft occlusion in 2014, diabetes, hypertension, and atrial fibrillation diagnosed in 2014.  He was admitted in 2019 for a right leg clot status post embolectomy and fasciotomy and was started on Plavix.  He was then admitted September 2019 with rapid atrial fibrillation and a positive troponin.  Catheterization at that time showed non obstructive coronary artery disease.  His atrial fibrillation symptoms include palpitations, shortness of breath, and weakness.  He is currently on Eliquis.  He was initially on amiodarone, though this was stopped due to hyperthyroidism.  Today, denies symptoms of palpitations, chest pain, shortness of breath, orthopnea, PND, lower extremity edema, claudication, dizziness, presyncope, syncope, bleeding, or neurologic sequela. The patient is tolerating medications without difficulties. Plan ablation today.    Past Medical History:  Diagnosis Date   Arthritis    "knees, elbows" (03/18/2018)   Chronic edema    a. Chronic RLE edema.   COPD (chronic obstructive pulmonary disease) (HCC)    Coronary artery disease    a. MI s/p balloon 1996, details unclear.   Dysrhythmia    PROXIMAL MARGIN FIBRILATION   GERD (gastroesophageal reflux disease)    Hyperlipidemia    Hypertension     Myocardial infarction ([place]) 1996   PAD (peripheral artery disease) ([place])    a. s/p stenting 08/2012, 02/2013.    PAF (paroxysmal atrial fibrillation) ([place])    Sleep apnea    "have mask; have to start wearing it" (03/18/2018)   Stroke [facility]) ~ 2014   denies residual on 03/18/2018   Thrombosis of lower extremity    a. Listed on patient's medical bracelet - at [place].   Type II diabetes mellitus ([place])    Past Surgical History:  Procedure Laterality Date   ABDOMINAL AORTOGRAM W/LOWER EXTREMITY N/A 07/25/2019   Procedure: ABDOMINAL AORTOGRAM W/LOWER EXTREMITY;  Surgeon: Waynetta Sandy, MD;  Location: [place] [facility];  Service: Cardiovascular;  Laterality: N/A;   ANGIOPLASTY Right 06/03/2020   Procedure: BALLOON ANGIOPLASTY POPITEAL ARTERY;  Surgeon: Cherre Robins, MD;  [facility]: [facility];  Service: Vascular;  Laterality: Right;   ANTERIOR LUMBAR FUSION  1981   APPENDECTOMY     APPLICATION OF WOUND VAC Right 01/09/2018   Procedure: APPLICATION OF WOUND VAC ON RIGHT LOWER LEG;  Surgeon: Conrad Garland, MD;  Location: [place];  Service: Vascular;  Laterality: Right;   BACK SURGERY     CATARACT EXTRACTION W/ INTRAOCULAR LENS  IMPLANT, BILATERAL Bilateral    CORONARY ANGIOPLASTY WITH STENT PLACEMENT     "I've got 1 stent in there" (03/18/2018)   FASCIOTOMY Right 01/09/2018   Procedure: FASCIOTOMY RIGHT LOWER LEG;  Surgeon: Conrad Harvel, MD;  Location: [place];  Service: Vascular;  Laterality: Right;   FASCIOTOMY Right 06/04/2020   Procedure: RIGHT LOWER EXTREMITY FASCIOTOMY;  Surgeon: Elam Dutch, MD;  [facility];  Service: Vascular;  Laterality: Right;   FASCIOTOMY CLOSURE Right 01/11/2018   Procedure: FASCIOTOMY CLOSURE RIGHT CALF;  Surgeon: Conrad Pleasanton, MD;  Location: [facility] OR;  Service: Vascular;  Laterality: Right;   FEMORAL-POPLITEAL BYPASS GRAFT Right 01/09/2018   Procedure: RIGHT FEMORAL-POPLITEAL ARTERY BYPASS USING PROPATEN 6MM X 80CM VASCULAR GRAFT;  Surgeon: Conrad Roanoke, MD;   Location: [place];  Service: Vascular;  Laterality: Right;   HEMORRHOID SURGERY     LEFT HEART CATH AND CORONARY ANGIOGRAPHY N/A 03/19/2018   Procedure: LEFT HEART CATH AND CORONARY ANGIOGRAPHY;  Surgeon: Martinique, Peter M, MD;  Location: [place] [facility];  Service: Cardiovascular;  Laterality: N/A;   LOWER EXTREMITY ANGIOGRAM Right 06/02/2020   Procedure: RIGHT LOWER EXTREMITY ANGIOGRAM INTERVENTION;  Surgeon: Cherre Robins, MD;  Location: [place];  Service: Vascular;  Laterality: Right;   LOWER EXTREMITY ANGIOGRAM Right 06/03/2020   Procedure: RIGHT LOWER EXTREMITY ANGIOGRAM from [place];  Surgeon: Cherre Robins, MD;  Location: [place];  Service: Vascular;  Laterality: Right;   LOWER EXTREMITY ANGIOGRAPHY N/A 08/31/2019   Procedure: LOWER EXTREMITY ANGIOGRAPHY;  Surgeon: [name], MD;  Location: [place] [facility];  Service: Cardiovascular;  Laterality: N/A;   LOWER EXTREMITY ANGIOGRAPHY Right 01/18/2020   Procedure: LOWER EXTREMITY ANGIOGRAPHY;  Surgeon: [name], MD;  Location: [place] [facility];  Service: Cardiovascular;  Laterality: Right;   PATCH ANGIOPLASTY Right 01/09/2018   Procedure: PATCH ANGIOPLASTY USING Rueben Bash BIOLOGIC 1CM X 6CM PATCH;  Surgeon: Conrad Edenton, MD;  Location: [place];  Service: Vascular;  Laterality: Right;   PERIPHERAL VASCULAR ATHERECTOMY Right 01/19/2020   Procedure: PERIPHERAL VASCULAR ATHERECTOMY;  Surgeon: [name], MD;  Location: [place] [facility];  Service: Cardiovascular;  Laterality: Right;  Femoral popliteal and tibioperoneal arteries.   PERIPHERAL VASCULAR BALLOON ANGIOPLASTY Right 09/01/2019   Procedure: PERIPHERAL VASCULAR BALLOON ANGIOPLASTY;  Surgeon: Waynetta Sandy, MD;  Location: [place] [facility];  Service: Cardiovascular;  Laterality: Right;  peroneal artery.   PERIPHERAL VASCULAR INTERVENTION Right 07/25/2019   Procedure: PERIPHERAL VASCULAR INTERVENTION;  Surgeon: Waynetta Sandy, MD;   Location: [place] [facility];  Service: Cardiovascular;  Laterality: Right;  SFA X 3   PERIPHERAL VASCULAR INTERVENTION Right 09/01/2019   Procedure: PERIPHERAL VASCULAR INTERVENTION;  Surgeon: Waynetta Sandy, MD;  Location: [place] [facility];  Service: Cardiovascular;  Laterality: Right;  superficial femoral   PERIPHERAL VASCULAR THROMBECTOMY Right 09/01/2019   Procedure: PERIPHERAL VASCULAR THROMBECTOMY;  Surgeon: Waynetta Sandy, MD;  Location: [place] [facility];  Service: Cardiovascular;  Laterality: Right;   PERIPHERAL VASCULAR THROMBECTOMY Right 01/18/2020   Procedure: PERIPHERAL VASCULAR THROMBECTOMY;  Surgeon: [name], MD;  Location: [place] [facility];  Service: Cardiovascular;  Laterality: Right;   PERIPHERAL VASCULAR THROMBECTOMY N/A 01/19/2020   Procedure: LYSIS RECHECK;  Surgeon: [name], MD;  Location: [place] [facility];  Service: Cardiovascular;  Laterality: N/A;   POSTERIOR LUMBAR FUSION  1978   THROMBECTOMY FEMORAL ARTERY Right 01/09/2018   Procedure: THROMBECTOMY RIGHT LOWER LEG;  Surgeon: Conrad Yoakum, MD;  Location: [place];  Service: Vascular;  Laterality: Right;   TONSILLECTOMY       No current facility-administered medications for this encounter.   Current Outpatient Medications  Medication Sig Dispense Refill   acetaminophen (TYLENOL) 500 MG tablet Take 500 mg by mouth 4 (four) times daily as needed for moderate pain or headache.  apixaban (ELIQUIS) 2.5 MG TABS tablet TAKE 1 TABLET (2.5 MG TOTAL) BY MOUTH TWO TIMES DAILY. (Patient taking differently: Take 5 mg by mouth 2 (two) times daily.) 60 tablet 3   aspirin EC 81 MG EC tablet Take 1 tablet (81 mg total) by mouth daily. Swallow whole. 30 tablet 11   atorvastatin (LIPITOR) 40 MG tablet Take 1 tablet (40 mg total) by mouth daily at 6 PM. 30 tablet 1   Cholecalciferol (VITAMIN D) 50 MCG (2000 UT) tablet Take 2,000 Units by mouth daily.     ezetimibe (ZETIA) 10 MG tablet  Take 10 mg by mouth every evening.      ferrous sulfate 325 (65 FE) MG tablet Take 325 mg by mouth daily with breakfast.     hydrochlorothiazide (MICROZIDE) 12.5 MG capsule Take 12.5 mg by mouth daily.     memantine (NAMENDA) 10 MG tablet Take 20 mg by mouth at bedtime.     metoprolol tartrate (LOPRESSOR) 50 MG tablet Take 50 mg by mouth 2 (two) times daily.     nitroGLYCERIN (NITROSTAT) 0.4 MG SL tablet Place 0.4 mg under the tongue every 5 (five) minutes x 3 doses as needed for chest pain.     pantoprazole (PROTONIX) 40 MG tablet Take 40 mg by mouth daily before breakfast.     ramipril (ALTACE) 10 MG capsule Take 10 mg by mouth 2 (two) times daily.     rivastigmine (EXELON) 6 MG capsule Take 6 mg by mouth 2 (two) times daily.     vitamin B-12 (CYANOCOBALAMIN) 500 MCG tablet Take 500 mcg by mouth daily.     diltiazem (CARDIZEM) 60 MG tablet Take 1 tablet (60 mg total) by mouth 2 (two) times daily. (Patient not taking: No sig reported) 60 tablet 1   enoxaparin (LOVENOX) 80 MG/0.8ML injection Inject 0.825 mLs (82.5 mg total) into the skin every 12 (twelve) hours for 3 days. (Patient not taking: Reported on 12/05/2020) 4.95 mL 0   metoprolol tartrate (LOPRESSOR) 25 MG tablet TAKE 1 TABLET (25 MG TOTAL) BY MOUTH TWO TIMES DAILY. (Patient not taking: No sig reported) 60 tablet 3    Allergies:   Oxycodone, Statins, Amiodarone, Donepezil, Imipramine, and Oxybutynin chloride   Social History:  The patient  reports that he quit smoking about 26 years ago. He has a 90.00 pack-year smoking history. He has never used smokeless tobacco. He reports that he does not drink alcohol and does not use drugs.   Family History:  The patient's family history includes CAD in his brother and mother; Hypertension in an other family member.   ROS:  Please see the history of present illness.   Otherwise, review of systems is positive for none.   All other systems are reviewed and negative.   PHYSICAL EXAM: VS:  There  were no vitals taken for this visit. , BMI There is no height or weight on file to calculate BMI. GEN: Well nourished, well developed, in no acute distress  HEENT: normal  Neck: no JVD, carotid bruits, or masses Cardiac: RRR; no murmurs, rubs, or gallops,no edema  Respiratory:  clear to auscultation bilaterally, normal work of breathing GI: soft, nontender, nondistended, + BS MS: no deformity or atrophy  Skin: warm and dry Neuro:  Strength and sensation are intact Psych: euthymic mood, full affect  Recent Labs: 01/21/2020: TSH 6.863 05/18/2020: ALT 83; B Natriuretic Peptide 377.4 05/21/2020: Magnesium 1.8 12/05/2020: BUN 25; Creatinine, Ser 1.51; Hemoglobin 11.0; Platelets 254; Potassium 4.3; Sodium 139  Lipid Panel     Component Value Date/Time   CHOL 125 05/19/2020 0427   TRIG 127 05/19/2020 0427   HDL 29 (L) 05/19/2020 0427   CHOLHDL 4.3 05/19/2020 0427   VLDL 25 05/19/2020 0427   LDLCALC 71 05/19/2020 0427     Wt Readings from Last 3 Encounters:  09/18/20 82.1 kg  06/25/20 77.1 kg  06/02/20 79.7 kg      Other studies Reviewed: Additional studies/ records that were reviewed today include: TTE 05/19/20  Review of the above records today demonstrates:   1. Left ventricular ejection fraction, by estimation, is 60 to 65%. The  left ventricle has normal function. The left ventricle has no regional  wall motion abnormalities. Left ventricular diastolic parameters are  consistent with Grade II diastolic  dysfunction (pseudonormalization). Elevated left atrial pressure.   2. Right ventricular systolic function is normal. The right ventricular  size is normal. There is normal pulmonary artery systolic pressure. The  estimated right ventricular systolic pressure is 81.4 mmHg.   3. Left atrial size was mild to moderately dilated.   4. Right atrial size was mildly dilated.   5. The mitral valve is normal in structure. Trivial mitral valve  regurgitation. No evidence of  mitral stenosis.   6. The aortic valve is normal in structure. Aortic valve regurgitation is  trivial. Mild aortic valve sclerosis is present, with no evidence of  aortic valve stenosis.   7. The inferior vena cava is normal in size with greater than 50%  respiratory variability, suggesting right atrial pressure of 3 mmHg.    ASSESSMENT AND PLAN:  1.  Paroxysmal atrial fibrillation: Donald Gordon has presented today for surgery, with the diagnosis of AF.  The various methods of treatment have been discussed with the patient and family. After consideration of risks, benefits and other options for treatment, the patient has consented to  Procedure(s): Catheter ablation as a surgical intervention .  Risks include but not limited to complete heart block, stroke, esophageal damage, nerve damage, bleeding, vascular damage, tamponade, perforation, MI, and death. The patient's history has been reviewed, patient examined, no change in status, stable for surgery.  I have reviewed the patient's chart and labs.  Questions were answered to the patient's satisfaction.    Marsella Suman Curt Bears, MD 12/07/2020 7:09 AM

## 2020-12-07 NOTE — Anesthesia Preprocedure Evaluation (Addendum)
Anesthesia Evaluation  Patient identified by MRN, date of birth, ID band Patient awake    Reviewed: Allergy & Precautions, NPO status , Patient's Chart, lab work & pertinent test results, reviewed documented beta blocker date and time   Airway Mallampati: II  TM Distance: >3 FB Neck ROM: Full    Dental  (+) Upper Dentures, Lower Dentures   Pulmonary sleep apnea and Continuous Positive Airway Pressure Ventilation , COPD, former smoker,    Pulmonary exam normal breath sounds clear to auscultation       Cardiovascular hypertension, Pt. on medications and Pt. on home beta blockers + angina + CAD, + Past MI, + Peripheral Vascular Disease and +CHF  + dysrhythmias Atrial Fibrillation  Rhythm:Irregular Rate:Normal  MI 1996, right fem/pop bypass 07/21  Echo 05/19/20 1. Left ventricular ejection fraction, by estimation, is 60 to 65%. The left ventricle has normal function. The left ventricle has no regional wall motion abnormalities. Left ventricular diastolic parameters are consistent with Grade II diastolic dysfunction (pseudonormalization). Elevated left atrial pressure.  2. Right ventricular systolic function is normal. The right ventricular  size is normal. There is normal pulmonary artery systolic pressure. The estimated right ventricular systolic pressure is 75.4 mmHg.  3. Left atrial size was mild to moderately dilated.  4. Right atrial size was mildly dilated.  5. The mitral valve is normal in structure. Trivial mitral valve regurgitation. No evidence of mitral stenosis.  6. The aortic valve is normal in structure. Aortic valve regurgitation is trivial. Mild aortic valve sclerosis is present, with no evidence of aortic valve stenosis.  7. The inferior vena cava is normal in size with greater than 50% respiratory variability, suggesting right atrial pressure of 3 mmHg.   Neuro/Psych CVA negative psych ROS   GI/Hepatic Neg liver  ROS, GERD  Medicated,  Endo/Other  diabetes, Well Controlled, Type 2, Oral Hypoglycemic AgentsHyperlipidemia  Renal/GU Renal InsufficiencyRenal disease     Musculoskeletal  (+) Arthritis ,   Abdominal (+)  Abdomen: soft. Bowel sounds: normal.  Peds  Hematology negative hematology ROS (+)   Anesthesia Other Findings   Reproductive/Obstetrics                            Anesthesia Physical  Anesthesia Plan  ASA: 3  Anesthesia Plan: General   Post-op Pain Management:    Induction: Intravenous  PONV Risk Score and Plan: 2 and Treatment may vary due to age or medical condition and Ondansetron  Airway Management Planned: Oral ETT  Additional Equipment: Arterial line  Intra-op Plan:   Post-operative Plan: Extubation in OR  Informed Consent: I have reviewed the patients History and Physical, chart, labs and discussed the procedure including the risks, benefits and alternatives for the proposed anesthesia with the patient or authorized representative who has indicated his/her understanding and acceptance.     Dental advisory given  Plan Discussed with: CRNA  Anesthesia Plan Comments:         Anesthesia Quick Evaluation

## 2020-12-07 NOTE — Anesthesia Procedure Notes (Signed)
Procedure Name: MAC Date/Time: 12/07/2020 9:04 AM Performed by: Imagene Riches, CRNA Pre-anesthesia Checklist: Patient identified, Emergency Drugs available, Suction available, Patient being monitored and Timeout performed Oxygen Delivery Method: Nasal cannula

## 2020-12-07 NOTE — Transfer of Care (Signed)
Immediate Anesthesia Transfer of Care Note  Patient: Donald Gordon  Procedure(s) Performed: ATRIAL FIBRILLATION ABLATION  Patient Location: Cath Lab  Anesthesia Type:General  Level of Consciousness: awake, alert  and drowsy  Airway & Oxygen Therapy: Patient Spontanous Breathing  Post-op Assessment: Report given to RN, Post -op Vital signs reviewed and stable and Patient moving all extremities  Post vital signs: Reviewed and stable  Last Vitals:  Vitals Value Taken Time  BP 174/57 12/07/20 1249  Temp    Pulse 70 12/07/20 1250  Resp 17 12/07/20 1250  SpO2 92 % 12/07/20 1250  Vitals shown include unvalidated device data.  Last Pain:  Vitals:   12/07/20 1250  TempSrc:   PainSc: 0-No pain         Complications: No notable events documented.

## 2020-12-07 NOTE — Anesthesia Postprocedure Evaluation (Signed)
Anesthesia Post Note  Patient: ELIGIO ANGERT  Procedure(s) Performed: TRANSESOPHAGEAL ECHOCARDIOGRAM (TEE) BUBBLE STUDY     Patient location during evaluation: PACU Anesthesia Type: MAC Level of consciousness: awake and alert and oriented Pain management: pain level controlled Vital Signs Assessment: post-procedure vital signs reviewed and stable Respiratory status: spontaneous breathing, nonlabored ventilation and respiratory function stable Cardiovascular status: stable and blood pressure returned to baseline Postop Assessment: no apparent nausea or vomiting Anesthetic complications: no Comments: Patient now for Atrial Fibrillation ablation in the Cardiac Cath Lab.   No notable events documented.  Last Vitals:  Vitals:   12/07/20 0935 12/07/20 0945  BP: (!) 152/74   Pulse: (!) 59 69  Resp: 16 (!) 23  Temp:    SpO2: 90% 92%    Last Pain:  Vitals:   12/07/20 0945  TempSrc:   PainSc: 0-No pain                 Saleh Ulbrich A.

## 2020-12-07 NOTE — CV Procedure (Signed)
   TRANSESOPHAGEAL ECHOCARDIOGRAM (TEE) NOTE  INDICATIONS: Atrial fibrillation pre-ablation  PROCEDURE:   Informed consent was obtained prior to the procedure. The risks, benefits and alternatives for the procedure were discussed and the patient comprehended these risks.  Risks include, but are not limited to, cough, sore throat, vomiting, nausea, somnolence, esophageal and stomach trauma or perforation, bleeding, low blood pressure, aspiration, pneumonia, infection, trauma to the teeth and death.    After a procedural time-out, the patient was given propofol per anesthesia for sedation.  The patient's heart rate, blood pressure, and oxygen saturation are monitored continuously during the procedure.  The transesophageal probe was inserted in the esophagus and stomach without difficulty and multiple views were obtained.  The patient was kept under observation until the patient left the procedure room.  I was present face-to-face 100% of this time. The patient left the procedure room in stable condition.   Agitated microbubble saline contrast was administered.  COMPLICATIONS:    There were no immediate complications.  Findings:  LEFT VENTRICLE: The left ventricular wall thickness is mildly increased.  The left ventricular cavity is normal in size. Wall motion is normal.  LVEF is 60-65%.  RIGHT VENTRICLE:  The right ventricle is normal in structure and function without any thrombus or masses.    LEFT ATRIUM:  The left atrium is moderately dilated in size without any thrombus or masses.  There is not spontaneous echo contrast ("smoke") in the left atrium consistent with a low flow state.  LEFT ATRIAL APPENDAGE:  The left atrial appendage is free of any thrombus or masses. The appendage has single lobes. Pulse doppler indicates low flow in the appendage.  ATRIAL SEPTUM:  The atrial septum appears intact and is free of thrombus and/or masses.  There is no evidence for interatrial shunting by  color doppler and saline microbubble.  RIGHT ATRIUM:  The right atrium is mildly dilated in size and function without any thrombus or masses.  MITRAL VALVE:  The mitral valve is normal in structure and function with  trivial  regurgitation.  There were no vegetations or stenosis.  AORTIC VALVE:  The aortic valve is trileaflet and mildly sclerotic with  trivial  regurgitation.  There were no vegetations or stenosis  TRICUSPID VALVE:  The tricuspid valve is normal in structure and function with  trivial  regurgitation.  There were no vegetations or stenosis   PULMONIC VALVE:  The pulmonic valve is normal in structure and function with  trivial  regurgitation.  There were no vegetations or stenosis.   AORTIC ARCH, ASCENDING AND DESCENDING AORTA:  There was grade 2 Ron Parker et. Al, 1992) atherosclerosis of the ascending aorta, aortic arch, or proximal descending aorta.  12. PULMONARY VEINS: Anomalous pulmonary venous return was not noted.  13. PERICARDIUM: The pericardium appeared normal and non-thickened.  There is no pericardial effusion.  IMPRESSION:   No LAA thrombus Negative for PFO No significant valve disease LVEF 60-65% with normal wall motion  RECOMMENDATIONS:     Ok to proceed with catheter ablation for afib  Time Spent Directly with the Patient:  45 minutes   [name], MD, [facility], [place] Director of the [facility] Lipid [facility]  Card[facility]ovascular [facility] Diplomate of the American Board of Clinical Lipidology Attending Cardiologist  Direct Dial: [phone]  Fax: [phone]  Website:  [email or web address] 12/07/2020, 9:20 AM

## 2020-12-07 NOTE — Anesthesia Postprocedure Evaluation (Signed)
Anesthesia Post Note  Patient: Donald Gordon  Procedure(s) Performed: ATRIAL FIBRILLATION ABLATION     Patient location during evaluation: PACU Anesthesia Type: General Level of consciousness: awake and alert and oriented Pain management: pain level controlled Vital Signs Assessment: post-procedure vital signs reviewed and stable Respiratory status: spontaneous breathing, nonlabored ventilation and respiratory function stable Cardiovascular status: blood pressure returned to baseline and stable Postop Assessment: no apparent nausea or vomiting Anesthetic complications: no   No notable events documented.  Last Vitals:  Vitals:   12/07/20 1250 12/07/20 1255  BP: (!) 174/57 (!) 155/58  Pulse: 70 61  Resp: 19 17  Temp: (!) 36.2 C   SpO2: 95% 91%    Last Pain:  Vitals:   12/07/20 1250  TempSrc:   PainSc: 0-No pain                 Verbon Giangregorio A.

## 2020-12-07 NOTE — Transfer of Care (Signed)
Immediate Anesthesia Transfer of Care Note  Patient: Donald Gordon  Procedure(s) Performed: TRANSESOPHAGEAL ECHOCARDIOGRAM (TEE) BUBBLE STUDY  Patient Location: Endoscopy Unit  Anesthesia Type:MAC  Level of Consciousness: drowsy  Airway & Oxygen Therapy: Patient Spontanous Breathing  Post-op Assessment: Report given to RN and Post -op Vital signs reviewed and stable  Post vital signs: Reviewed and stable  Last Vitals:  Vitals Value Taken Time  BP 115/59 12/07/20 0916  Temp    Pulse 74 12/07/20 0917  Resp 21 12/07/20 0917  SpO2 91 % 12/07/20 0917  Vitals shown include unvalidated device data.  Last Pain:  Vitals:   12/07/20 0744  TempSrc: Oral  PainSc: 4          Complications: No notable events documented.

## 2020-12-07 NOTE — Progress Notes (Signed)
  Echocardiogram Echocardiogram Transesophageal has been performed.  Donald Gordon 12/07/2020, 9:16 AM

## 2020-12-07 NOTE — Discharge Instructions (Addendum)
Post procedure care instructions No driving for 4 days. No lifting over 5 lbs for 1 week. No vigorous or sexual activity for 1 week. You may return to work/your usual activities on 12/15/20. Keep procedure site clean & dry. If you notice increased pain, swelling, bleeding or pus, call/return!  You may shower after 24 hours, but no soaking in baths/hot tubs/pools for 1 week.    TEE  YOU HAD AN CARDIAC PROCEDURE TODAY: Refer to the procedure report and other information in the discharge instructions given to you for any specific questions about what was found during the examination. If this information does not answer your questions, please call [facility] office at [phone] to clarify.   DIET: Your first meal following the procedure should be a light meal and then it is ok to progress to your normal diet. A half-sandwich or bowl of soup is an example of a good first meal. Heavy or fried foods are harder to digest and may make you feel nauseous or bloated. Drink plenty of fluids but you should avoid alcoholic beverages for 24 hours. If you had a esophageal dilation, please see attached instructions for diet.   ACTIVITY: Your care partner should take you home directly after the procedure. You should plan to take it easy, moving slowly for the rest of the day. You can resume normal activity the day after the procedure however YOU SHOULD NOT DRIVE, use power tools, machinery or perform tasks that involve climbing or major physical exertion for 24 hours (because of the sedation medicines used during the test).   SYMPTOMS TO REPORT IMMEDIATELY: A cardiologist can be reached at any hour. Please call [phone] for any of the following symptoms:  Vomiting of blood or coffee ground material  New, significant abdominal pain  New, significant chest pain or pain under the shoulder blades  Painful or persistently difficult swallowing  New shortness of breath  Black, tarry-looking or red, bloody  stools  FOLLOW UP:  Please also call with any specific questions about appointments or follow up tests.   You have an appointment set up with the [facility].  Multiple studies have shown that being followed by a dedicated atrial fibrillation clinic in addition to the standard care you receive from your other physicians improves health. We believe that enrollment in the atrial fibrillation clinic will allow [place] to better care for you.   The phone number to the [facility] is [phone]. The clinic is staffed Monday through Friday from 8:30am to 5pm.  Parking Directions: The clinic is located in the Heart and Vascular Building connected to [facility] 1)From [address] turn on to [address] and go to the 3rd entrance  (Heart and Vascular entrance) on the right. 2)Look to the right for Heart &Vascular Parking Garage. 3)A code for the entrance is required, for July is 3342.   4)Take the elevators to the 1st floor. Registration is in the room with the glass walls at the end of the hallway.  If you have any trouble parking or locating the clinic, please don't hesitate to call [phone].

## 2020-12-07 NOTE — Interval H&P Note (Signed)
History and Physical Interval Note:  Donald Gordon  has presented today for surgery, with the diagnosis of AFIB.  The various methods of treatment have been discussed with the patient and family. After consideration of risks, benefits and other options for treatment, the patient has consented to  Procedure(s): TRANSESOPHAGEAL ECHOCARDIOGRAM (TEE) (N/A) BUBBLE STUDY as a surgical intervention.  The patient's history has been reviewed, patient examined, no change in status, stable for surgery.  I have reviewed the patient's chart and labs.  Questions were answered to the patient's satisfaction.     [name]

## 2020-12-09 ENCOUNTER — Encounter (HOSPITAL_COMMUNITY): Payer: Self-pay | Admitting: Internal Medicine

## 2020-12-10 ENCOUNTER — Other Ambulatory Visit: Payer: Self-pay | Admitting: Radiation Oncology

## 2020-12-10 ENCOUNTER — Ambulatory Visit
Admission: RE | Admit: 2020-12-10 | Discharge: 2020-12-10 | Disposition: A | Discharge status: Home / Self Care | Payer: No Typology Code available for payment source | Source: Ambulatory Visit | Attending: Radiation Oncology | Admitting: Radiation Oncology

## 2020-12-10 ENCOUNTER — Ambulatory Visit
Admission: RE | Admit: 2020-12-10 | Discharge: 2020-12-10 | Disposition: A | Discharge status: Home / Self Care | Payer: Self-pay | Source: Ambulatory Visit | Attending: Radiation Oncology | Admitting: Radiation Oncology

## 2020-12-10 ENCOUNTER — Encounter (HOSPITAL_COMMUNITY): Payer: Self-pay | Admitting: Cardiology

## 2020-12-10 DIAGNOSIS — Z08 Encounter for follow-up examination after completed treatment for malignant neoplasm: Secondary | ICD-10-CM | POA: Diagnosis not present

## 2020-12-10 DIAGNOSIS — R911 Solitary pulmonary nodule: Secondary | ICD-10-CM

## 2020-12-10 NOTE — Progress Notes (Signed)
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  Lung Side: Right   Does the patient complain of any of the following: Pain:Pain to right groin rating 8/10 following procedure to have stent placed on 12/10/20.  Shortness of breath w/wo exertion: Yes, patient reports shortness of breath on exertion. Cough: Yes, dry cough.  Hemoptysis: no Pain with swallowing: Patient reports discomfort after procedure performed on 12/10/20 Swallowing/choking concerns: no Appetite: Good Energy Level: Mild fatigue Post radiation skin Changes: no    Additional comments if applicable:

## 2020-12-10 NOTE — Progress Notes (Signed)
Radiation Oncology         ([phone]) [phone] ________________________________  Outpatient Follow Up- Conducted via telephone due to current COVID-19 concerns for limiting patient exposure  I spoke with the patient to conduct this consult visit via telephone to spare the patient unnecessary potential exposure in the healthcare setting during the current COVID-19 pandemic. The patient was notified in advance and was offered a [place] meeting to allow for face to face communication but unfortunately reported that they did not have the appropriate resources/technology to support such a visit and instead preferred to proceed with a telephone visit.    Name: Donald Gordon        MRN: [id number]  Date of Service: 12/10/2020 DOB: Nov 09, 1940  WN:UUVOZD, [place]   DIAGNOSIS: The encounter diagnosis was Right lower lobe pulmonary nodule.   HISTORY OF PRESENT ILLNESS: Donald Gordon is a 80 y.o. male seen at the request of Dr. 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 The patient has a history of COPD, and recent CT scan on 02/22/2020 was performed following a chest x-ray that had shown a possible nodule. The CT showed the area in the right lower lobe measuring 10 x 8 x 8 mm, along the paraspinal region posterior to the posterior pulmonary veins. No evidence of adenopathy was identified. Pulmonary medicine met with the patient at the [place], Dr. Deboraha Sprang recommended following this area as well as the possibility of meeting with [place] to discuss putative stereotactic body radiotherapy. He is not a good candidate for bronchoscopy or CT-guided biopsy. He underwent a PET scan in October 2021 that showed low level metabolic activity suspicious for reactive changes but he did have intense uptake in the anorectal region. There was a lot of conversation with Dr. Marin Comment in GI in [facility] about rectal polyps that need to be  further worked up but in the midst of this his cardiovascular conditions worsened and we were going to get him set up to see Dr. Marcello Moores in [place] but he no showed for her office and after looking more in depth at his case, she recommended he go to a tertiary medical center for care.  Regarding his lung nodule, we elected to follow this site.  We could not reach the patient on multiple occasions but he was finally able to have a repeat scan that was performed on 08/31/20 that showed persistence of this RLL nodule measuring 11 mm in greatest dimension. No adenopathy was present. persistent cardiovascular atherosclerosis was again seen. He also had a stable 2 mm LLL nodule, cholelithiasis and evidence of emphysema.  He has been able to go for repeat CT scan of the chest with contrast on 11/21/2020 at the [facility], it showed a 1 x 0.9 x 1.2 cm right lower lobe nodule, while the [place] comparisons had indicated there was a slight change, his scans in March of this year again measured the largest dimension at 11 mm.  No adenopathy was identified, no new nodules were mentioned.  They did not describe the left lower lobe nodule that we followed that was previously 2 mm, but stated there is an unchanged right upper lobe nodule but measurements are not given.  He is contacted today to review these results.  Of note last week he underwent an ablation for atrial fibrillation with Dr. Curt Bears.   PREVIOUS RADIATION  THERAPY: No   PAST MEDICAL HISTORY:  Past Medical History:  Diagnosis Date   Arthritis    "knees, elbows" (03/18/2018)   Chronic edema    a. Chronic RLE edema.   COPD (chronic obstructive pulmonary disease) (HCC)    Coronary artery disease    a. MI s/p balloon 1996, details unclear.   Dysrhythmia    PROXIMAL MARGIN FIBRILATION   GERD (gastroesophageal reflux disease)    Hyperlipidemia    Hypertension    Myocardial infarction ([place]) 1996   PAD (peripheral artery disease) ([place])    a. s/p stenting 08/2012, 02/2013.     PAF (paroxysmal atrial fibrillation) ([place])    Sleep apnea    "have mask; have to start wearing it" (03/18/2018)   Stroke [facility]) ~ 2014   denies residual on 03/18/2018   Thrombosis of lower extremity    a. Listed on patient's medical bracelet - at [place].   Type II diabetes mellitus ([place])        PAST SURGICAL HISTORY: Past Surgical History:  Procedure Laterality Date   ABDOMINAL AORTOGRAM W/LOWER EXTREMITY N/A 07/25/2019   Procedure: ABDOMINAL AORTOGRAM W/LOWER EXTREMITY;  Surgeon: Waynetta Sandy, MD;  Location: [place] [facility];  Service: Cardiovascular;  Laterality: N/A;   ANGIOPLASTY Right 06/03/2020   Procedure: BALLOON ANGIOPLASTY POPITEAL ARTERY;  Surgeon: Cherre Robins, MD;  Location: [facility];  Service: Vascular;  Laterality: Right;   ANTERIOR LUMBAR FUSION  1981   APPENDECTOMY     APPLICATION OF WOUND VAC Right 01/09/2018   Procedure: APPLICATION OF WOUND VAC ON RIGHT LOWER LEG;  Surgeon: Conrad Bantam, MD;  Location: [place];  Service: Vascular;  Laterality: Right;   ATRIAL FIBRILLATION ABLATION N/A 12/07/2020   Procedure: ATRIAL FIBRILLATION ABLATION;  Surgeon: Constance Haw, MD;  Location: [place] [facility];  Service: Cardiovascular;  Laterality: N/A;   BACK SURGERY     BUBBLE STUDY  12/07/2020   Procedure: BUBBLE STUDY;  Surgeon: [name], MD;  Location: [place];  Service: Cardiovascular;;   CATARACT EXTRACTION W/ INTRAOCULAR LENS  IMPLANT, BILATERAL Bilateral    CORONARY ANGIOPLASTY WITH STENT PLACEMENT     "I've got 1 stent in there" (03/18/2018)   FASCIOTOMY Right 01/09/2018   Procedure: FASCIOTOMY RIGHT LOWER LEG;  Surgeon: Conrad Marianna, MD;  Location: [place];  Service: Vascular;  Laterality: Right;   FASCIOTOMY Right 06/04/2020   Procedure: RIGHT LOWER EXTREMITY FASCIOTOMY;  Surgeon: Elam Dutch, MD;  Location: [place];  Service: Vascular;  Laterality: Right;   FASCIOTOMY CLOSURE Right 01/11/2018   Procedure: FASCIOTOMY CLOSURE RIGHT CALF;   Surgeon: Conrad Ray, MD;  Location: [place];  Service: Vascular;  Laterality: Right;   FEMORAL-POPLITEAL BYPASS GRAFT Right 01/09/2018   Procedure: RIGHT FEMORAL-POPLITEAL ARTERY BYPASS USING PROPATEN 6MM X 80CM VASCULAR GRAFT;  Surgeon: Conrad Metz, MD;  Location: [place];  Service: Vascular;  Laterality: Right;   HEMORRHOID SURGERY     LEFT HEART CATH AND CORONARY ANGIOGRAPHY N/A 03/19/2018   Procedure: LEFT HEART CATH AND CORONARY ANGIOGRAPHY;  Surgeon: Martinique, Peter M, MD;  Location: [place] [facility];  Service: Cardiovascular;  Laterality: N/A;   LOWER EXTREMITY ANGIOGRAM Right 06/02/2020   Procedure: RIGHT LOWER EXTREMITY ANGIOGRAM INTERVENTION;  Surgeon: Cherre Robins, MD;  Location: [place];  Service: Vascular;  Laterality: Right;   LOWER EXTREMITY ANGIOGRAM Right 06/03/2020   Procedure: RIGHT LOWER EXTREMITY ANGIOGRAM from [place];  Surgeon: Cherre Robins,  MD;  Location: [facility];  Service: Vascular;  Laterality: Right;   LOWER EXTREMITY ANGIOGRAPHY N/A 08/31/2019   Procedure: LOWER EXTREMITY ANGIOGRAPHY;  Surgeon: [name], MD;  Location: [place] [facility];  Service: Cardiovascular;  Laterality: N/A;   LOWER EXTREMITY ANGIOGRAPHY Right 01/18/2020   Procedure: LOWER EXTREMITY ANGIOGRAPHY;  Surgeon: [name], MD;  Location: [place] [facility];  Service: Cardiovascular;  Laterality: Right;   PATCH ANGIOPLASTY Right 01/09/2018   Procedure: PATCH ANGIOPLASTY USING Rueben Bash BIOLOGIC 1CM X 6CM PATCH;  Surgeon: Conrad [place], MD;  Location: [place];  Service: Vascular;  Laterality: Right;   PERIPHERAL VASCULAR ATHERECTOMY Right 01/19/2020   Procedure: PERIPHERAL VASCULAR ATHERECTOMY;  Surgeon: [name], MD;  Location: [place] [facility];  Service: Cardiovascular;  Laterality: Right;  Femoral popliteal and tibioperoneal arteries.   PERIPHERAL VASCULAR BALLOON ANGIOPLASTY Right 09/01/2019   Procedure: PERIPHERAL VASCULAR BALLOON ANGIOPLASTY;  Surgeon: Waynetta Sandy, MD;  Location: [place] [facility];  Service: Cardiovascular;  Laterality: Right;  peroneal artery.   PERIPHERAL VASCULAR INTERVENTION Right 07/25/2019   Procedure: PERIPHERAL VASCULAR INTERVENTION;  Surgeon: Waynetta Sandy, MD;  Location: [place] [facility];  Service: Cardiovascular;  Laterality: Right;  SFA X 3   PERIPHERAL VASCULAR INTERVENTION Right 09/01/2019   Procedure: PERIPHERAL VASCULAR INTERVENTION;  Surgeon: Waynetta Sandy, MD;  Location: [place] [facility];  Service: Cardiovascular;  Laterality: Right;  superficial femoral   PERIPHERAL VASCULAR THROMBECTOMY Right 09/01/2019   Procedure: PERIPHERAL VASCULAR THROMBECTOMY;  Surgeon: Waynetta Sandy, MD;  Location: [place] [facility];  Service: Cardiovascular;  Laterality: Right;   PERIPHERAL VASCULAR THROMBECTOMY Right 01/18/2020   Procedure: PERIPHERAL VASCULAR THROMBECTOMY;  Surgeon: [name], MD;  Location: [place] [facility];  Service: Cardiovascular;  Laterality: Right;   PERIPHERAL VASCULAR THROMBECTOMY N/A 01/19/2020   Procedure: LYSIS RECHECK;  Surgeon: [name], MD;  Location: [place] [facility];  Service: Cardiovascular;  Laterality: N/A;   POSTERIOR LUMBAR FUSION  1978   TEE WITHOUT CARDIOVERSION N/A 12/07/2020   Procedure: TRANSESOPHAGEAL ECHOCARDIOGRAM (TEE);  Surgeon: [name], MD;  Location: [facility];  Service: Cardiovascular;  Laterality: N/A;   THROMBECTOMY FEMORAL ARTERY Right 01/09/2018   Procedure: THROMBECTOMY RIGHT LOWER LEG;  Surgeon: Conrad , MD;  Location: [facility] OR;  Service: Vascular;  Laterality: Right;   TONSILLECTOMY       FAMILY HISTORY:  Family History  Problem Relation Age of Onset   CAD Mother    CAD Brother    Hypertension Other      SOCIAL HISTORY:  reports that he quit smoking about 26 years ago. He has a 90.00 pack-year smoking history. He has never used smokeless tobacco. He reports that he does not drink  alcohol and does not use drugs. The patient is single and his daughter Sharyn Lull helps with his care. He lives in [place].    ALLERGIES: Oxycodone, Statins, Amiodarone, Donepezil, Imipramine, and Oxybutynin chloride   MEDICATIONS:  Current Outpatient Medications  Medication Sig Dispense Refill   acetaminophen (TYLENOL) 500 MG tablet Take 500 mg by mouth 4 (four) times daily as needed for moderate pain or headache.     apixaban (ELIQUIS) 2.5 MG TABS tablet TAKE 1 TABLET (2.5 MG TOTAL) BY MOUTH TWO TIMES DAILY. 60 tablet 3   atorvastatin (LIPITOR) 40 MG tablet Take 1 tablet (40 mg total) by mouth daily at 6 PM. 30 tablet 1   Cholecalciferol (VITAMIN D)  50 MCG (2000 UT) tablet Take 2,000 Units by mouth daily.     ezetimibe (ZETIA) 10 MG tablet Take 10 mg by mouth every evening.      ferrous sulfate 325 (65 FE) MG tablet Take 325 mg by mouth daily with breakfast.     hydrochlorothiazide (MICROZIDE) 12.5 MG capsule Take 12.5 mg by mouth daily.     memantine (NAMENDA) 10 MG tablet Take 20 mg by mouth at bedtime.     metoprolol tartrate (LOPRESSOR) 50 MG tablet Take 50 mg by mouth 2 (two) times daily.     nitroGLYCERIN (NITROSTAT) 0.4 MG SL tablet Place 0.4 mg under the tongue every 5 (five) minutes x 3 doses as needed for chest pain.     pantoprazole (PROTONIX) 40 MG tablet Take 40 mg by mouth daily before breakfast.     ramipril (ALTACE) 10 MG capsule Take 10 mg by mouth 2 (two) times daily.     rivastigmine (EXELON) 6 MG capsule Take 6 mg by mouth 2 (two) times daily.     vitamin B-12 (CYANOCOBALAMIN) 500 MCG tablet Take 500 mcg by mouth daily.     aspirin EC 81 MG EC tablet Take 1 tablet (81 mg total) by mouth daily. Swallow whole. (Patient not taking: Reported on 12/10/2020) 30 tablet 11   No current facility-administered medications for this encounter.     REVIEW OF SYSTEMS: On review of systems, the patient states that he is doing very well overall despite the fact that he has had pain in  the groin since his procedure last week.  He denies any chest pain or shortness of breath.  He denies any productive cough or hemoptysis.  No other complaints are verbalized.  He states that he has been receiving phone calls from GI, but is undecided whether he would like to have additional colonoscopy.  PHYSICAL EXAM:  Wt Readings from Last 3 Encounters:  12/07/20 180 lb (81.6 kg)  09/18/20 181 lb (82.1 kg)  06/25/20 170 lb (77.1 kg)   Unable to assess due to encounter type.  ECOG = 1  0 - Asymptomatic (Fully active, able to carry on all predisease activities without restriction)  1 - Symptomatic but completely ambulatory (Restricted in physically strenuous activity but ambulatory and able to carry out work of a light or sedentary nature. For example, light housework, office work)  2 - Symptomatic, <50% in bed during the day (Ambulatory and capable of all self care but unable to carry out any work activities. Up and about more than 50% of waking hours)  3 - Symptomatic, >50% in bed, but not bedbound (Capable of only limited self-care, confined to bed or chair 50% or more of waking hours)  4 - Bedbound (Completely disabled. Cannot carry on any self-care. Totally confined to bed or chair)  5 - Death   Eustace Pen MM, Creech RH, Tormey DC, et al. [phone]). "Toxicity and response criteria of the [facility] Group". [place] Oncol. 5 (6): 649-55    LABORATORY DATA:  Lab Results  Component Value Date   WBC 5.9 12/05/2020   HGB 11.0 (L) 12/05/2020   HCT 35.5 (L) 12/05/2020   MCV 85 12/05/2020   PLT 254 12/05/2020   Lab Results  Component Value Date   NA 139 12/05/2020   K 4.3 12/05/2020   CL 103 12/05/2020   CO2 20 12/05/2020   Lab Results  Component Value Date   ALT 83 (H) 05/18/2020   AST 61 (  H) 05/18/2020   ALKPHOS 77 05/18/2020   BILITOT 0.4 05/18/2020      RADIOGRAPHY: EP STUDY  Result Date: 12/07/2020 SURGEON:  Allegra Lai, MD PREPROCEDURE  DIAGNOSES: 1. Paroxysmal atrial fibrillation. POSTPROCEDURE DIAGNOSES: 1. Paroxysmal  atrial fibrillation. PROCEDURES: 1. Comprehensive electrophysiologic study. 2. Coronary sinus pacing and recording. 3. Three-dimensional mapping of atrial fibrillation with additional mapping and ablation of a second discrete focus 4. Ablation of atrial fibrillation with additional mapping and ablation of a second discrete focus 5. Intracardiac echocardiography. 6. Transseptal puncture of an intact septum. 7. Arrhythmia induction with pacing with dobutamine infusion INTRODUCTION:  RHYAN RADLER is a 80 y.o. male with a history of paroxysmal atrial fibrillation who now presents for EP study and radiofrequency ablation.  The patient reports initially being diagnosed with atrial fibrillation after presenting with symptomatic palpitations and fatgiue. The patient reports increasing frequency and duration of atrial fibrillation since that time.  The patient has failed medical therapy.  The patient therefore presents today for catheter ablation of atrial fibrillation. DESCRIPTION OF PROCEDURE:  Informed written consent was obtained, and the patient was brought to the electrophysiology lab in a fasting state.  The patient was adequately sedated with intravenous medications as outlined in the anesthesia report.  The patient's left and right groins were prepped and draped in the usual sterile fashion by the EP lab staff.  Using a percutaneous Seldinger technique, two 8-French hemostasis sheaths were placed in the right femoral vein, and one 7 [place] and one 11-French hemostasis sheaths were placed into the left common femoral vein. An esophageal temperature probe was inserted to monitor for heating of the esophagus during the procedure. Direct ultrasound guidance is used for right and left femoral veins with normal vessel patency. Ultrasound images are captured and stored in the patient's chart. Using ultrasound guidance, the Brockenbrough  needle and wire were visualized entering the vessel. Catheter Placement:  A 7-French Biosense Webster Decapolar coronary sinus catheter was introduced through the right common femoral vein and advanced into the coronary sinus for recording and pacing from this location.  Initial Measurements: The patient presented to the electrophysiology lab in sinus rhythm. his  PR interval measured 144 msec with a QRS duration of 99 msec and a QT interval of 444 msec.  The AH interval measured 121 msec and the HV interval measured 41 msec.   Intracardiac Echocardiography: A 10-French Biosense Webster AcuNav intracardiac echocardiography catheter was introduced through the right common femoral vein and advanced into the right atrium. Intracardiac echocardiography was performed of the left atrium, and a three-dimensional anatomical rendering of the left atrium was performed using CARTO sound technology.  The patient was noted to have a moderate sized left atrium.  The interatrial septum was prominent but not aneurysmal. All 4 pulmonary veins were visualized and noted to have separate ostia.  The pulmonary veins were moderate in size.  The left atrial appendage was visualized and did not reveal thrombus.   There was no evidence of pulmonary vein stenosis. Transseptal Puncture: The right common femoral vein sheaths were exchanged for two 8.5 French Agillis transseptal sheath and transseptal access was achieved in a standard fashion using a Brockenbrough needle under fluoroscopy with intracardiac echocardiography confirmation of the transseptal puncture.  Once transseptal access had been achieved, heparin was administered intravenously and intra- arterially in order to maintain an ACT of greater than 350 seconds throughout the procedure. 3D Mapping and Ablation: The His bundle catheter was removed and in its place a  3.5 mm [employer] ablation catheter was advanced into the right atrium.  The transseptal  sheath was pulled back into the IVC over a guidewire.  The ablation catheter was advanced across the transseptal hole using the wire as a guide.  The transseptal sheath was then re-advanced over the guidewire into the left atrium.  A duodecapolar Biosense Webster pentarray mapping catheter was introduced through the transseptal sheath and positioned over the mouth of all 4 pulmonary veins.  Three-dimensional electroanatomical mapping was performed using CARTO technology.  This demonstrated electrical activity within all four pulmonary veins at baseline. The patient underwent successful sequential electrical isolation and anatomical encircling of all four pulmonary veins using radiofrequency current with a circular mapping catheter as a guide.  A WACA approach was used.  Entrance and exit block were confirmed. Cardioversion: The patient was then cardioverted to sinus rhythm with a single synchronized 360-J biphasic shock with cardioversion electrodes in the anterior-posterior thoracic configuration. He maintained sinus rhythm initially but then had recurence of afib with catheter manipulation within the left atrial, requiring repeat cardioversion with 360J biphasic.  He remained in sinus rhythm thereafter. Measurements Following Ablation: Following ablation, dobutamine was infused up to 20 mcg/kg/min with no inducible atrial fibrillation, atrial tachycardia, atrial flutter, or sustained PACs. In sinus rhythm with RR interval was 1162 msec, with PR 170 msec, QRS 108 msec, and QT 550 msec.  Following ablation the AH interval measured 79 msec with an HV interval of 50 msec. Ventricular pacing was performed, which revealed midline decremental VA conduction with a VA Wenckebach cycle length of less than 435mec. Rapid atrial pacing was performed, which revealed an AV Wenckebach cycle length of 380 msec.  Electroisolation was then again confirmed in all four pulmonary veins.  Pacing was performed along the ablation line  which confirmed entrance and exit block. The procedure was therefore considered completed.  All catheters were removed, and the sheaths were aspirated and flushed.  The patient was transferred to the recovery area for sheath removal per protocol. EBL<132m  Intracardiac echocardiogram revealed no pericardial effusion.  There were no early apparent complications. CONCLUSIONS: 1. Sinus rhythm upon presentation.  2. Successful electrical isolation and anatomical encircling of all four pulmonary veins with radiofrequency current. 3. No inducible arrhythmias following ablation both on and off of dobutamine 4. No early apparent complications. Will MaHassell Doneamnitz,MD 12:27 PM 12/07/2020   ECHO TEE  Result Date: 12/07/2020    TRANSESOPHOGEAL ECHO REPORT   Patient Name:   THBUCKLEY BRADLYate of Exam: 12/07/2020 Medical Rec #:  [id number]   Height:       69.0 in Accession #:    [id number]  Weight:       180.0 lb Date of Birth:  [birth year]  BSA:          1.976 m Patient Age:    [age]      BP:           140/64 mmHg Patient Gender: M             HR:           71 bpm. Exam Location:  Inpatient Procedure: Transesophageal Echo, Cardiac Doppler and Color Doppler Indications:     I48.91* Unspeicified atrial fibrillation  History:         Patient has prior history of Echocardiogram examinations, most  recent 05/19/2020. Abnormal ECG, Stroke, Arrythmias:Atrial                  Fibrillation, Signs/Symptoms:Chest Pain; Risk                  Factors:Hypertension and Diabetes.  Sonographer:     Roseanna Rainbow RDCS Referring Phys:  [id number] Nadean Corwin HILTY Diagnosing Phys: Lyman Bishop MD  Sonographer Comments: Pre- ablation study PROCEDURE: After discussion of the risks and benefits of a TEE, an informed consent was obtained from the patient. The transesophogeal probe was passed without difficulty through the esophogus of the patient. Imaged were obtained with the patient in a supine position. Sedation performed by  different physician. The patient was monitored while under deep sedation. Anesthestetic sedation was provided intravenously by Anesthesiology: 168m of Propofol. The patient developed no complications during the procedure. IMPRESSIONS  1. Left ventricular ejection fraction, by estimation, is 60 to 65%. The left ventricle has normal function. There is mild left ventricular hypertrophy.  2. Right ventricular systolic function is normal. The right ventricular size is normal.  3. Left atrial size was moderately dilated. No left atrial/left atrial appendage thrombus was detected.  4. Right atrial size was mildly dilated.  5. The mitral valve is grossly normal. Trivial mitral valve regurgitation.  6. The aortic valve is tricuspid. Aortic valve regurgitation is trivial. Mild aortic valve sclerosis is present, with no evidence of aortic valve stenosis.  7. There is mild (Grade II) plaque. Conclusion(s)/Recommendation(s): No LAA thrombus - ok to proceed with ablation. FINDINGS  Left Ventricle: Left ventricular ejection fraction, by estimation, is 60 to 65%. The left ventricle has normal function. The left ventricular internal cavity size was normal in size. There is mild left ventricular hypertrophy. Right Ventricle: The right ventricular size is normal. No increase in right ventricular wall thickness. Right ventricular systolic function is normal. Left Atrium: Left atrial size was moderately dilated. No left atrial/left atrial appendage thrombus was detected. Right Atrium: Right atrial size was mildly dilated. Pericardium: There is no evidence of pericardial effusion. Mitral Valve: The mitral valve is grossly normal. Trivial mitral valve regurgitation. Tricuspid Valve: The tricuspid valve is grossly normal. Tricuspid valve regurgitation is trivial. Aortic Valve: The aortic valve is tricuspid. Aortic valve regurgitation is trivial. Mild aortic valve sclerosis is present, with no evidence of aortic valve stenosis. Pulmonic  Valve: The pulmonic valve was normal in structure. Pulmonic valve regurgitation is trivial. Aorta: The aortic root and ascending aorta are structurally normal, with no evidence of dilitation. There is mild (Grade II) plaque. IAS/Shunts: No atrial level shunt detected by color flow Doppler. KLyman BishopMD Electronically signed by KLyman BishopMD Signature Date/Time: 12/07/2020/3:08:32 PM    Final         IMPRESSION/PLAN: 1. 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  We have been following the patient, and since this process began, his nodule has truly been quite stable with only incremental change of about 2 mm that has waxed and waned to some degree.  The patient does have other comorbidities that have high priority as well.  I discussed the findings with the patient and his daughter, at this time it would be reasonable to consider treatment however this has not significantly changed.  We have been following him at short and frequent intervals.  I think it is fair to say that it would not be unreasonable to consider repeat imaging in 6 months time  given the recency of his cardiac treatments, he is in agreement with this and I will make sure Dr. Lisbeth Renshaw also agrees prior to coordinating his next visit.  He appears to have some additional follow-up for this through the [place] and I will touch base with the lung cancer screening clinic to confirm his appointments with them so we are not duplicating efforts. 2. History of rectal polyps.  The patient admits that he is not interested in having colonoscopy at this time given his recent cardiac issues.  I reiterated to him that Dr. Marcello Moores after reviewing his case would recommend that he seek colonoscopies in a tertiary center given the complications he has had following colonoscopies in the past from having to discontinue anticoagulation. 3.  Cardiovascular disease. The patient just underwent ablation for his atrial  fibrillation.  We will follow his course expectantly but he will follow-up with his cardiology team here in [place].  Given current concerns for patient exposure during the COVID-19 pandemic, this encounter was conducted via telephone.  The patient has provided two factor identification and has given verbal consent for this type of encounter and has been advised to only accept a meeting of this type in a secure network environment. The time spent during this encounter was 35 minutes including preparation, discussion, and coordination of the patient's care. The attendants for this meeting include  Hayden Pedro  and Allayne Butcher. During the encounter,  Hayden Pedro was located remotely at home. ARNALDO HEFFRON was located at home.  I also left a voicemail for his daughter Lyndee Hensen who was not able to take the call today.  In a visit lasting 60 minutes, greater than 50% of the time was spent face to face discussing the patient's condition, in preparation for the discussion, and coordinating the patient's care.   The above documentation reflects my direct findings during this shared patient visit. Please see the separate note by Dr. Lisbeth Renshaw on this date for the remainder of the patient's plan of care.    Carola Rhine, PAC

## 2020-12-14 ENCOUNTER — Encounter (HOSPITAL_COMMUNITY): Payer: Self-pay | Admitting: Physician Assistant

## 2020-12-14 ENCOUNTER — Ambulatory Visit (HOSPITAL_COMMUNITY)
Admission: RE | Admit: 2020-12-14 | Discharge: 2020-12-14 | Disposition: A | Discharge status: Home / Self Care | Payer: No Typology Code available for payment source | Source: Ambulatory Visit | Attending: Physician Assistant | Admitting: Physician Assistant

## 2020-12-14 ENCOUNTER — Other Ambulatory Visit (HOSPITAL_COMMUNITY): Payer: Self-pay | Admitting: Physician Assistant

## 2020-12-14 ENCOUNTER — Other Ambulatory Visit: Payer: Self-pay

## 2020-12-14 VITALS — BP 118/52 | HR 72 | Ht 69.0 in | Wt 186.4 lb

## 2020-12-14 DIAGNOSIS — M7989 Other specified soft tissue disorders: Secondary | ICD-10-CM | POA: Diagnosis not present

## 2020-12-14 DIAGNOSIS — Z8679 Personal history of other diseases of the circulatory system: Secondary | ICD-10-CM | POA: Insufficient documentation

## 2020-12-14 DIAGNOSIS — R58 Hemorrhage, not elsewhere classified: Secondary | ICD-10-CM | POA: Insufficient documentation

## 2020-12-14 DIAGNOSIS — Z09 Encounter for follow-up examination after completed treatment for conditions other than malignant neoplasm: Secondary | ICD-10-CM | POA: Diagnosis present

## 2020-12-14 DIAGNOSIS — I480 Paroxysmal atrial fibrillation: Secondary | ICD-10-CM

## 2020-12-14 NOTE — Progress Notes (Signed)
Patient presents for groin check post ablation. He states that it was very tender on the left cath site post procedure with significant bruising. Over the last 2 days the pain has improved.   On exam today, he has ecchymosis on the inner aspect of his left thigh. Groin exam reveals ~ 1 inch diameter area of swelling. No oozing, bleeding, or redness. No bruit. Appears to be normal post ablation convalescence. F/u as scheduled in the AF clinic.

## 2020-12-19 ENCOUNTER — Other Ambulatory Visit: Payer: Self-pay | Admitting: Radiation Oncology

## 2020-12-19 ENCOUNTER — Telehealth: Payer: Self-pay | Admitting: Radiation Oncology

## 2020-12-19 NOTE — Telephone Encounter (Signed)
I called the patient's daughter Sharyn Lull to see if she was aware of her dad's pulmonary and PET appointment. I left her a voicemail to call us at her convenience.

## 2021-01-07 ENCOUNTER — Ambulatory Visit (HOSPITAL_COMMUNITY)
Admit: 2021-01-07 | Discharge: 2021-01-07 | Disposition: A | Discharge status: Home / Self Care | Payer: Medicare Other | Source: Ambulatory Visit | Attending: Physician Assistant | Admitting: Physician Assistant

## 2021-01-07 ENCOUNTER — Other Ambulatory Visit: Payer: Self-pay

## 2021-01-07 ENCOUNTER — Encounter (HOSPITAL_COMMUNITY): Payer: Self-pay | Admitting: Physician Assistant

## 2021-01-07 VITALS — BP 144/70 | HR 78 | Ht 69.0 in | Wt 183.8 lb

## 2021-01-07 DIAGNOSIS — Z87891 Personal history of nicotine dependence: Secondary | ICD-10-CM | POA: Insufficient documentation

## 2021-01-07 DIAGNOSIS — Z885 Allergy status to narcotic agent status: Secondary | ICD-10-CM | POA: Insufficient documentation

## 2021-01-07 DIAGNOSIS — Z8249 Family history of ischemic heart disease and other diseases of the circulatory system: Secondary | ICD-10-CM | POA: Diagnosis not present

## 2021-01-07 DIAGNOSIS — I10 Essential (primary) hypertension: Secondary | ICD-10-CM | POA: Insufficient documentation

## 2021-01-07 DIAGNOSIS — I480 Paroxysmal atrial fibrillation: Secondary | ICD-10-CM | POA: Diagnosis not present

## 2021-01-07 DIAGNOSIS — I739 Peripheral vascular disease, unspecified: Secondary | ICD-10-CM | POA: Insufficient documentation

## 2021-01-07 DIAGNOSIS — I2510 Atherosclerotic heart disease of native coronary artery without angina pectoris: Secondary | ICD-10-CM | POA: Diagnosis not present

## 2021-01-07 DIAGNOSIS — Z7901 Long term (current) use of anticoagulants: Secondary | ICD-10-CM | POA: Diagnosis not present

## 2021-01-07 DIAGNOSIS — Z79899 Other long term (current) drug therapy: Secondary | ICD-10-CM | POA: Diagnosis not present

## 2021-01-07 DIAGNOSIS — D6869 Other thrombophilia: Secondary | ICD-10-CM | POA: Insufficient documentation

## 2021-01-07 NOTE — Progress Notes (Addendum)
Primary Care Physician: [facility] Primary Cardiologist: [facility] cardiology  Primary Electrophysiologist: Dr Curt Bears Referring Physician: Dr Thereasa Distance Donald Gordon is a 80 y.o. male with a history of CAD, OSA, prior CVA, PAD s/p fempop bypass and iliac stents, DM, HTN, and atrial fibrillation who presents for follow up in the [facility].  The patient was initially diagnosed with atrial fibrillation in 2014. Patient is on Eliquis for a CHADS2VASC score of 7. He was initially on amiodarone but this was discontinued due to hypothyroidism. He continued to have symptomatic afib and underwent afib ablation with Dr Curt Bears on 12/07/20. He reports that he has done well since the procedure with no heart racing or palpitations. His left sided groin pain has improved. He denies CP or swallowing pain.   Today, he denies symptoms of palpitations, chest pain, shortness of breath, orthopnea, PND, lower extremity edema, dizziness, presyncope, syncope, snoring, daytime somnolence, bleeding, or neurologic sequela. The patient is tolerating medications without difficulties and is otherwise without complaint today.    Atrial Fibrillation Risk Factors:  he does not have symptoms or diagnosis of sleep apnea. he does not have a history of rheumatic fever.   he has a BMI of Body mass index is 27.14 kg/m.Marland Kitchen Filed Weights   01/07/21 0918  Weight: 83.4 kg    Family History  Problem Relation Age of Onset   CAD Mother    CAD Brother    Hypertension Other      Atrial Fibrillation Management history:  Previous antiarrhythmic drugs: amiodarone Previous cardioversions: none Previous ablations: 12/07/20 CHADS2VASC score: 7 Anticoagulation history: Eliquis   Past Medical History:  Diagnosis Date   Arthritis    "knees, elbows" (03/18/2018)   Chronic edema    a. Chronic RLE edema.   COPD (chronic obstructive pulmonary disease) (HCC)    Coronary artery disease    a. MI  s/p balloon 1996, details unclear.   Dysrhythmia    PROXIMAL MARGIN FIBRILATION   GERD (gastroesophageal reflux disease)    Hyperlipidemia    Hypertension    Myocardial infarction ([place]) 1996   PAD (peripheral artery disease) ([place])    a. s/p stenting 08/2012, 02/2013.    PAF (paroxysmal atrial fibrillation) ([place])    Sleep apnea    "have mask; have to start wearing it" (03/18/2018)   Stroke [facility]) ~ 2014   denies residual on 03/18/2018   Thrombosis of lower extremity    a. Listed on patient's medical bracelet - at [place].   Type II diabetes mellitus ([place])    Past Surgical History:  Procedure Laterality Date   ABDOMINAL AORTOGRAM W/LOWER EXTREMITY N/A 07/25/2019   Procedure: ABDOMINAL AORTOGRAM W/LOWER EXTREMITY;  Surgeon: Waynetta Sandy, MD;  Location: [place] [facility];  Service: Cardiovascular;  Laterality: N/A;   ANGIOPLASTY Right 06/03/2020   Procedure: BALLOON ANGIOPLASTY POPITEAL ARTERY;  Surgeon: Cherre Robins, MD;  Location: [facility] OR;  Service: Vascular;  Laterality: Right;   ANTERIOR LUMBAR FUSION  1981   APPENDECTOMY     APPLICATION OF WOUND VAC Right 01/09/2018   Procedure: APPLICATION OF WOUND VAC ON RIGHT LOWER LEG;  Surgeon: Conrad [place]on, MD;  Location: [place];  Service: Vascular;  Laterality: Right;   ATRIAL FIBRILLATION ABLATION N/A 12/07/2020   Procedure: ATRIAL FIBRILLATION ABLATION;  Surgeon: Constance Haw, MD;  Location: [place] [facility];  Service: Cardiovascular;  Laterality: N/A;   BACK SURGERY     BUBBLE STUDY  12/07/2020   Procedure: BUBBLE STUDY;  Surgeon: [name], MD;  Location: [place];  Service: Cardiovascular;;   CATARACT EXTRACTION W/ INTRAOCULAR LENS  IMPLANT, BILATERAL Bilateral    CORONARY ANGIOPLASTY WITH STENT PLACEMENT     "I've got 1 stent in there" (03/18/2018)   FASCIOTOMY Right 01/09/2018   Procedure: FASCIOTOMY RIGHT LOWER LEG;  Surgeon: Conrad [place], MD;  Location: [place];  Service: Vascular;  Laterality: Right;    FASCIOTOMY Right 06/04/2020   Procedure: RIGHT LOWER EXTREMITY FASCIOTOMY;  Surgeon: Elam Dutch, MD;  Location: [place];  Service: Vascular;  Laterality: Right;   FASCIOTOMY CLOSURE Right 01/11/2018   Procedure: FASCIOTOMY CLOSURE RIGHT CALF;  Surgeon: Conrad [place], MD;  Location: [place];  Service: Vascular;  Laterality: Right;   FEMORAL-POPLITEAL BYPASS GRAFT Right 01/09/2018   Procedure: RIGHT FEMORAL-POPLITEAL ARTERY BYPASS USING PROPATEN 6MM X 80CM VASCULAR GRAFT;  Surgeon: Conrad [place], MD;  Location: [place];  Service: Vascular;  Laterality: Right;   HEMORRHOID SURGERY     LEFT HEART CATH AND CORONARY ANGIOGRAPHY N/A 03/19/2018   Procedure: LEFT HEART CATH AND CORONARY ANGIOGRAPHY;  Surgeon: Martinique, Peter M, MD;  Location: [place] [facility];  Service: Cardiovascular;  Laterality: N/A;   LOWER EXTREMITY ANGIOGRAM Right 06/02/2020   Procedure: RIGHT LOWER EXTREMITY ANGIOGRAM INTERVENTION;  Surgeon: Cherre Robins, MD;  Location: [place];  Service: Vascular;  Laterality: Right;   LOWER EXTREMITY ANGIOGRAM Right 06/03/2020   Procedure: RIGHT LOWER EXTREMITY ANGIOGRAM from [place];  Surgeon: Cherre Robins, MD;  Location: [address];  Service: Vascular;  Laterality: Right;   LOWER EXTREMITY ANGIOGRAPHY N/A 08/31/2019   Procedure: LOWER EXTREMITY ANGIOGRAPHY;  Surgeon: [name], MD;  Location: [place] [facility];  Service: Cardiovascular;  Laterality: N/A;   LOWER EXTREMITY ANGIOGRAPHY Right 01/18/2020   Procedure: LOWER EXTREMITY ANGIOGRAPHY;  Surgeon: [name], MD;  Location: [place] [facility];  Service: Cardiovascular;  Laterality: Right;   PATCH ANGIOPLASTY Right 01/09/2018   Procedure: PATCH ANGIOPLASTY USING Rueben Bash BIOLOGIC 1CM X 6CM PATCH;  Surgeon: Conrad [place], MD;  Location: [place];  Service: Vascular;  Laterality: Right;   PERIPHERAL VASCULAR ATHERECTOMY Right 01/19/2020   Procedure: PERIPHERAL VASCULAR ATHERECTOMY;  Surgeon: [name], MD;   Location: [place] [facility];  Service: Cardiovascular;  Laterality: Right;  Femoral popliteal and tibioperoneal arteries.   PERIPHERAL VASCULAR BALLOON ANGIOPLASTY Right 09/01/2019   Procedure: PERIPHERAL VASCULAR BALLOON ANGIOPLASTY;  Surgeon: Waynetta Sandy, MD;  Location: [place] [facility];  Service: Cardiovascular;  Laterality: Right;  peroneal artery.   PERIPHERAL VASCULAR INTERVENTION Right 07/25/2019   Procedure: PERIPHERAL VASCULAR INTERVENTION;  Surgeon: Waynetta Sandy, MD;  Location: [place] [facility];  Service: Cardiovascular;  Laterality: Right;  SFA X 3   PERIPHERAL VASCULAR INTERVENTION Right 09/01/2019   Procedure: PERIPHERAL VASCULAR INTERVENTION;  Surgeon: Waynetta Sandy, MD;  Location: [place] [facility];  Service: Cardiovascular;  Laterality: Right;  superficial femoral   PERIPHERAL VASCULAR THROMBECTOMY Right 09/01/2019   Procedure: PERIPHERAL VASCULAR THROMBECTOMY;  Surgeon: Waynetta Sandy, MD;  Location: [place] [facility];  Service: Cardiovascular;  Laterality: Right;   PERIPHERAL VASCULAR THROMBECTOMY Right 01/18/2020   Procedure: PERIPHERAL VASCULAR THROMBECTOMY;  Surgeon: [name], MD;  Location: [place] [facility];  Service: Cardiovascular;  Laterality: Right;   PERIPHERAL VASCULAR THROMBECTOMY N/A 01/19/2020   Procedure: LYSIS RECHECK;  Surgeon: [name], MD;  Location:  [place] [facility];  Service: Cardiovascular;  Laterality: N/A;   POSTERIOR LUMBAR FUSION  1978   TEE WITHOUT CARDIOVERSION N/A 12/07/2020   Procedure: TRANSESOPHAGEAL ECHOCARDIOGRAM (TEE);  Surgeon: [name], MD;  Location: [facility] END[facility];  Service: Cardiovascular;  Laterality: N/A;   THROMBECTOMY FEMORAL ARTERY Right 01/09/2018   Procedure: THROMBECTOMY RIGHT LOWER LEG;  Surgeon: Conrad [place], MD;  Location: [facility] OR;  Service: Vascular;  Laterality: Right;   TONSILLECTOMY      Current Outpatient Medications  Medication Sig Dispense  Refill   acetaminophen (TYLENOL) 500 MG tablet Take 500 mg by mouth 4 (four) times daily as needed for moderate pain or headache.     albuterol (VENTOLIN HFA) 108 (90 Base) MCG/ACT inhaler Inhale into the lungs.     apixaban (ELIQUIS) 2.5 MG TABS tablet TAKE 1 TABLET (2.5 MG TOTAL) BY MOUTH TWO TIMES DAILY. 60 tablet 3   atorvastatin (LIPITOR) 40 MG tablet Take 1 tablet (40 mg total) by mouth daily at 6 PM. 30 tablet 1   Cholecalciferol (VITAMIN D) 50 MCG (2000 UT) tablet Take 2,000 Units by mouth daily.     clopidogrel (PLAVIX) 75 MG tablet Take 1 tablet by mouth daily.     ezetimibe (ZETIA) 10 MG tablet Take 10 mg by mouth every evening.      ferrous sulfate 325 (65 FE) MG tablet Take 325 mg by mouth daily with breakfast.     hydrochlorothiazide (MICROZIDE) 12.5 MG capsule Take 12.5 mg by mouth daily.     memantine (NAMENDA) 10 MG tablet Take 20 mg by mouth at bedtime.     metoprolol tartrate (LOPRESSOR) 50 MG tablet Take 50 mg by mouth 2 (two) times daily.     nitroGLYCERIN (NITROSTAT) 0.4 MG SL tablet Place 0.4 mg under the tongue every 5 (five) minutes x 3 doses as needed for chest pain.     pantoprazole (PROTONIX) 40 MG tablet Take 40 mg by mouth daily before breakfast.     ramipril (ALTACE) 10 MG capsule Take 10 mg by mouth 2 (two) times daily.     rivastigmine (EXELON) 6 MG capsule Take 6 mg by mouth 2 (two) times daily.     Tiotropium Bromide-Olodaterol 2.5-2.5 MCG/ACT AERS Take by mouth.     vitamin B-12 (CYANOCOBALAMIN) 500 MCG tablet Take 500 mcg by mouth daily.     No current facility-administered medications for this encounter.    Allergies  Allergen Reactions   Oxycodone Nausea And Vomiting   Statins Other (See Comments)    Muscle and Bone pain    Amiodarone     Caused issues and was stopped by MD- possible was the cause for the patient's heart "going in and out of rhythm" multiple times   Donepezil Other (See Comments)    Per [facility] Insomnia   Imipramine Other (See  Comments)    Per [facility]   Oxybutynin Chloride Swelling and Other (See Comments)    Per [facility]    Social History   Socioeconomic History   Marital status: Legally Separated    Spouse name: Not on file   Number of children: Not on file   Years of education: Not on file   Highest education level: Not on file  Occupational History   Not on file  Tobacco Use   Smoking status: Former    Packs/day: 3.00    Years: 30.00    Pack years: 90.00    Types: Cigarettes    Quit date: 1996  Years since quitting: 26.5   Smokeless tobacco: Never  Vaping Use   Vaping Use: Never used  Substance and Sexual Activity   Alcohol use: No    Comment: Remote alcohol use   Drug use: Never   Sexual activity: Not Currently  Other Topics Concern   Not on file  Social History Narrative   Not on file   Social Determinants of Health   Financial Resource Strain: Not on file  Food Insecurity: No Food Insecurity   Worried About Running Out of Food in the Last Year: Never true   Ran Out of Food in the Last Year: Never true  Transportation Needs: No Transportation Needs   Lack of Transportation (Medical): No   Lack of Transportation (Non-Medical): No  Physical Activity: Not on file  Stress: Not on file  Social Connections: Not on file  Intimate Partner Violence: Not on file     ROS- All systems are reviewed and negative except as per the HPI above.  Physical Exam: Vitals:   01/07/21 0918  BP: (!) 144/70  Pulse: 78  Weight: 83.4 kg  Height: 5\' 9"  (1.753 m)    GEN- The patient is a well appearing elderly male, alert and oriented x 3 today.   Head- normocephalic, atraumatic Eyes-  Sclera clear, conjunctiva pink Ears- hearing intact Oropharynx- clear Neck- supple  Lungs- Clear to ausculation bilaterally, normal work of breathing Heart- Regular rate and rhythm, no murmurs, rubs or gallops  GI- soft, NT, ND, + BS Extremities- no clubbing, cyanosis, or edema MS- no significant deformity or  atrophy Skin- no rash or lesion Psych- euthymic mood, full affect Neuro- strength and sensation are intact  Wt Readings from Last 3 Encounters:  01/07/21 83.4 kg  12/14/20 84.6 kg  12/07/20 81.6 kg    EKG today demonstrates  SR, NST Vent. rate 78 BPM PR interval 178 ms QRS duration 90 ms QT/QTcB 404/460 ms  Echo 05/19/20 demonstrated   1. Left ventricular ejection fraction, by estimation, is 60 to 65%. The  left ventricle has normal function. The left ventricle has no regional  wall motion abnormalities. Left ventricular diastolic parameters are  consistent with Grade II diastolic  dysfunction (pseudonormalization). Elevated left atrial pressure.   2. Right ventricular systolic function is normal. The right ventricular  size is normal. There is normal pulmonary artery systolic pressure. The  estimated right ventricular systolic pressure is 16.9 mmHg.   3. Left atrial size was mild to moderately dilated.   4. Right atrial size was mildly dilated.   5. The mitral valve is normal in structure. Trivial mitral valve  regurgitation. No evidence of mitral stenosis.   6. The aortic valve is normal in structure. Aortic valve regurgitation is  trivial. Mild aortic valve sclerosis is present, with no evidence of  aortic valve stenosis.   7. The inferior vena cava is normal in size with greater than 50%  respiratory variability, suggesting right atrial pressure of 3 mmHg.   Epic records are reviewed at length today  CHA2DS2-VASc Score = 7  The patient's score is based upon: CHF History: No HTN History: Yes Diabetes History: Yes Stroke History: Yes Vascular Disease History: Yes Age Score: 2 Gender Score: 0     ASSESSMENT AND PLAN: 1. Paroxysmal Atrial Fibrillation (ICD10:  I48.0) The patient's CHA2DS2-VASc score is 7, indicating a 11.2% annual risk of stroke.   Failed amiodarone 2/2 hypothyroidism. S/p afib ablation 12/07/20 Patient appears to be maintaining SR.  Continue  Eliquis 2.5 mg BID with no missed doses for at least 3 months post ablation. This dose was recently reduced at the [place]. Will request labs to make sure Cr > 1.5. Continue Lopressor 50 mg BID  2. Secondary Hypercoagulable State (ICD10:  D68.69) The patient is at significant risk for stroke/thromboembolism based upon his CHA2DS2-VASc Score of 7.  Continue Apixaban (Eliquis).   3. CAD Non obstructive No anginal symptoms. Followed at [facility]  4. HTN Stable, no changes today.  5. PAD Followed by vascular surgery.   Follow up with Dr Curt Bears as scheduled.    Addendum 01/08/21 Lab work from [place] received and reviewed. Cr 1.55. Eliquis 2.5 mg BID is the correct dose. Continue present therapy.    [facility] [address] [place], [place] [address] [phone] 01/07/2021 9:31 AM

## 2021-01-08 ENCOUNTER — Encounter: Payer: Self-pay | Admitting: Radiation Oncology

## 2021-01-08 NOTE — Addendum Note (Signed)
Encounter addended by: Oliver Barre, PA on: 01/08/2021 4:23 PM  Actions taken: Clinical Note Signed

## 2021-01-08 NOTE — Progress Notes (Signed)
I tried to call the patient's daughter Sharyn Lull again and left a message asking her to call me back to review that Dr. Lisbeth Renshaw reviewed her dad's imaging and recommendations for repeat CT chest 6 months from his last. I'm not sure if he will still have PET imaging or not, but was scheduled for this at the [place], but per Dr. Ida Rogue recommendations would not need this now.

## 2021-03-12 ENCOUNTER — Ambulatory Visit: Payer: No Typology Code available for payment source | Admitting: Cardiology

## 2021-03-12 NOTE — Progress Notes (Deleted)
Electrophysiology Office Note   Date:  03/12/2021   ID:  [name], [name] 1940-07-14, MRN [id number]  PCP:  [place]  Cardiologist: [facility] cardiology Primary Electrophysiologist:  Lekisha Mcghee Meredith Leeds, MD    Chief Complaint: Atrial fibrillation   History of Present Illness: Donald Gordon is a 80 y.o. male who is being seen today for the evaluation of atrial fibrillation at the request of [place]. Presenting today for electrophysiology evaluation.  He has a history significant for coronary artery disease status post MI and stenting in 1996, CVA, PAD status post femoropopliteal bypass in 2014 with iliac stents bilaterally and thrombolysis/thrombectomy of the stent graft in 2014, diabetes, hypertension, atrial fibrillation diagnosed in 2019.  He was admitted in 2019 for a clot in his leg status post embolectomy and fasciotomy was started on Plavix.  He was admitted September 2019 with rapid atrial fibrillation.  He is now status post atrial fibrillation ablation 12/07/2020.  Today, denies symptoms of palpitations, chest pain, shortness of breath, orthopnea, PND, lower extremity edema, claudication, dizziness, presyncope, syncope, bleeding, or neurologic sequela. The patient is tolerating medications without difficulties. ***    Past Medical History:  Diagnosis Date   Arthritis    "knees, elbows" (03/18/2018)   Chronic edema    a. Chronic RLE edema.   COPD (chronic obstructive pulmonary disease) (HCC)    Coronary artery disease    a. MI s/p balloon 1996, details unclear.   Dysrhythmia    PROXIMAL MARGIN FIBRILATION   GERD (gastroesophageal reflux disease)    Hyperlipidemia    Hypertension    Myocardial infarction ([place]) 1996   PAD (peripheral artery disease) ([place])    a. s/p stenting 08/2012, 02/2013.    PAF (paroxysmal atrial fibrillation) ([place])    Sleep apnea    "have mask; have to start wearing it" (03/18/2018)   Stroke [facility]) ~ 2014   denies residual on  03/18/2018   Thrombosis of lower extremity    a. Listed on patient's medical bracelet - at [place].   Type II diabetes mellitus ([place])    Past Surgical History:  Procedure Laterality Date   ABDOMINAL AORTOGRAM W/LOWER EXTREMITY N/A 07/25/2019   Procedure: ABDOMINAL AORTOGRAM W/LOWER EXTREMITY;  Surgeon: Waynetta Sandy, MD;  Location: [place] [facility];  Service: Cardiovascular;  Laterality: N/A;   ANGIOPLASTY Right 06/03/2020   Procedure: BALLOON ANGIOPLASTY POPITEAL ARTERY;  Surgeon: Cherre Robins, MD;  Location: [facility] OR;  Service: Vascular;  Laterality: Right;   ANTERIOR LUMBAR FUSION  1981   APPENDECTOMY     APPLICATION OF WOUND VAC Right 01/09/2018   Procedure: APPLICATION OF WOUND VAC ON RIGHT LOWER LEG;  Surgeon: Conrad [place], MD;  Location: [place];  Service: Vascular;  Laterality: Right;   ATRIAL FIBRILLATION ABLATION N/A 12/07/2020   Procedure: ATRIAL FIBRILLATION ABLATION;  Surgeon: Constance Haw, MD;  Location: [place] [facility];  Service: Cardiovascular;  Laterality: N/A;   BACK SURGERY     BUBBLE STUDY  12/07/2020   Procedure: BUBBLE STUDY;  Surgeon: [name], MD;  Location: [place];  Service: Cardiovascular;;   CATARACT EXTRACTION W/ INTRAOCULAR LENS  IMPLANT, BILATERAL Bilateral    CORONARY ANGIOPLASTY WITH STENT PLACEMENT     "I've got 1 stent in there" (03/18/2018)   FASCIOTOMY Right 01/09/2018   Procedure: FASCIOTOMY RIGHT LOWER LEG;  Surgeon: Conrad Canalou, MD;  Location: [place];  Service: Vascular;  Laterality: Right;   FASCIOTOMY Right 06/04/2020  Procedure: RIGHT LOWER EXTREMITY FASCIOTOMY;  Surgeon: Elam Dutch, MD;  Location: [facility] OR;  Service: Vascular;  Laterality: Right;   FASCIOTOMY CLOSURE Right 01/11/2018   Procedure: FASCIOTOMY CLOSURE RIGHT CALF;  Surgeon: Conrad Spencer, MD;  Location[facility] OR;  Service: Vascular;  Laterality: Right;   FEMORAL-POPLITEAL BYPASS GRAFT Right 01/09/2018   Procedure: RIGHT FEMORAL-POPLITEAL ARTERY BYPASS  USING PROPATEN 6MM X 80CM VASCULAR GRAFT;  Surgeon: Conrad White Swan, MD;  Location: [place];  Service: Vascular;  Laterality: Right;   HEMORRHOID SURGERY     LEFT HEART CATH AND CORONARY ANGIOGRAPHY N/A 03/19/2018   Procedure: LEFT HEART CATH AND CORONARY ANGIOGRAPHY;  Surgeon: Martinique, Peter M, MD;  Location: [place] [facility];  Service: Cardiovascular;  Laterality: N/A;   LOWER EXTREMITY ANGIOGRAM Right 06/02/2020   Procedure: RIGHT LOWER EXTREMITY ANGIOGRAM INTERVENTION;  Surgeon: Cherre Robins, MD;  Location: [place];  Service: Vascular;  Laterality: Right;   LOWER EXTREMITY ANGIOGRAM Right 06/03/2020   Procedure: RIGHT LOWER EXTREMITY ANGIOGRAM from [place];  Surgeon: Cherre Robins, MD;  Location: [place];  Service: Vascular;  Laterality: Right;   LOWER EXTREMITY ANGIOGRAPHY N/A 08/31/2019   Procedure: LOWER EXTREMITY ANGIOGRAPHY;  Surgeon: [name], MD;  Location: [place] [facility];  Service: Cardiovascular;  Laterality: N/A;   LOWER EXTREMITY ANGIOGRAPHY Right 01/18/2020   Procedure: LOWER EXTREMITY ANGIOGRAPHY;  Surgeon: [name], MD;  Location: [place] [facility];  Service: Cardiovascular;  Laterality: Right;   PATCH ANGIOPLASTY Right 01/09/2018   Procedure: PATCH ANGIOPLASTY USING Rueben Bash BIOLOGIC 1CM X 6CM PATCH;  Surgeon: Conrad Slater, MD;  Location: [place];  Service: Vascular;  Laterality: Right;   PERIPHERAL VASCULAR ATHERECTOMY Right 01/19/2020   Procedure: PERIPHERAL VASCULAR ATHERECTOMY;  Surgeon: [name], MD;  Location: [place] [facility];  Service: Cardiovascular;  Laterality: Right;  Femoral popliteal and tibioperoneal arteries.   PERIPHERAL VASCULAR BALLOON ANGIOPLASTY Right 09/01/2019   Procedure: PERIPHERAL VASCULAR BALLOON ANGIOPLASTY;  Surgeon: Waynetta Sandy, MD;  Location: [place] [facility];  Service: Cardiovascular;  Laterality: Right;  peroneal artery.   PERIPHERAL VASCULAR INTERVENTION Right 07/25/2019   Procedure:  PERIPHERAL VASCULAR INTERVENTION;  Surgeon: Waynetta Sandy, MD;  Location: [place] [facility];  Service: Cardiovascular;  Laterality: Right;  SFA X 3   PERIPHERAL VASCULAR INTERVENTION Right 09/01/2019   Procedure: PERIPHERAL VASCULAR INTERVENTION;  Surgeon: Waynetta Sandy, MD;  Location: [place] [facility];  Service: Cardiovascular;  Laterality: Right;  superficial femoral   PERIPHERAL VASCULAR THROMBECTOMY Right 09/01/2019   Procedure: PERIPHERAL VASCULAR THROMBECTOMY;  Surgeon: Waynetta Sandy, MD;  Location: [place] [facility];  Service: Cardiovascular;  Laterality: Right;   PERIPHERAL VASCULAR THROMBECTOMY Right 01/18/2020   Procedure: PERIPHERAL VASCULAR THROMBECTOMY;  Surgeon: [name], MD;  Location: [place] [facility];  Service: Cardiovascular;  Laterality: Right;   PERIPHERAL VASCULAR THROMBECTOMY N/A 01/19/2020   Procedure: LYSIS RECHECK;  Surgeon: [name], MD;  Location: [place] [facility];  Service: Cardiovascular;  Laterality: N/A;   POSTERIOR LUMBAR FUSION  1978   TEE WITHOUT CARDIOVERSION N/A 12/07/2020   Procedure: TRANSESOPHAGEAL ECHOCARDIOGRAM (TEE);  Surgeon: [name], MD;  Location: [place];  Service: Cardiovascular;  Laterality: N/A;   THROMBECTOMY FEMORAL ARTERY Right 01/09/2018   Procedure: THROMBECTOMY RIGHT LOWER LEG;  Surgeon: Conrad Laurium, MD;  Location: [place];  Service: Vascular;  Laterality: Right;   TONSILLECTOMY  Current Outpatient Medications  Medication Sig Dispense Refill   acetaminophen (TYLENOL) 500 MG tablet Take 500 mg by mouth 4 (four) times daily as needed for moderate pain or headache.     albuterol (VENTOLIN HFA) 108 (90 Base) MCG/ACT inhaler Inhale into the lungs.     apixaban (ELIQUIS) 2.5 MG TABS tablet TAKE 1 TABLET (2.5 MG TOTAL) BY MOUTH TWO TIMES DAILY. 60 tablet 3   atorvastatin (LIPITOR) 40 MG tablet Take 1 tablet (40 mg total) by mouth daily at 6 PM. 30 tablet 1    Cholecalciferol (VITAMIN D) 50 MCG (2000 UT) tablet Take 2,000 Units by mouth daily.     clopidogrel (PLAVIX) 75 MG tablet Take 1 tablet by mouth daily.     ezetimibe (ZETIA) 10 MG tablet Take 10 mg by mouth every evening.      ferrous sulfate 325 (65 FE) MG tablet Take 325 mg by mouth daily with breakfast.     hydrochlorothiazide (MICROZIDE) 12.5 MG capsule Take 12.5 mg by mouth daily.     memantine (NAMENDA) 10 MG tablet Take 20 mg by mouth at bedtime.     metoprolol tartrate (LOPRESSOR) 50 MG tablet Take 50 mg by mouth 2 (two) times daily.     nitroGLYCERIN (NITROSTAT) 0.4 MG SL tablet Place 0.4 mg under the tongue every 5 (five) minutes x 3 doses as needed for chest pain.     pantoprazole (PROTONIX) 40 MG tablet Take 40 mg by mouth daily before breakfast.     ramipril (ALTACE) 10 MG capsule Take 10 mg by mouth 2 (two) times daily.     rivastigmine (EXELON) 6 MG capsule Take 6 mg by mouth 2 (two) times daily.     Tiotropium Bromide-Olodaterol 2.5-2.5 MCG/ACT AERS Take by mouth.     vitamin B-12 (CYANOCOBALAMIN) 500 MCG tablet Take 500 mcg by mouth daily.     No current facility-administered medications for this visit.    Allergies:   Oxycodone, Statins, Amiodarone, Donepezil, Imipramine, and Oxybutynin chloride   Social History:  The patient  reports that he quit smoking about 26 years ago. He has a 90.00 pack-year smoking history. He has never used smokeless tobacco. He reports that he does not drink alcohol and does not use drugs.   Family History:  The patient's family history includes CAD in his brother and mother; Hypertension in an other family member.   ROS:  Please see the history of present illness.   Otherwise, review of systems is positive for none.   All other systems are reviewed and negative.   PHYSICAL EXAM: VS:  There were no vitals taken for this visit. , BMI There is no height or weight on file to calculate BMI. GEN: Well nourished, well developed, in no acute distress   HEENT: normal  Neck: no JVD, carotid bruits, or masses Cardiac: ***RRR; no murmurs, rubs, or gallops,no edema  Respiratory:  clear to auscultation bilaterally, normal work of breathing GI: soft, nontender, nondistended, + BS MS: no deformity or atrophy  Skin: warm and dry Neuro:  Strength and sensation are intact Psych: euthymic mood, full affect  EKG:  EKG {ACTION; IS/IS [id number]} ordered today. Personal review of the ekg ordered *** shows ***   Recent Labs: 05/18/2020: ALT 83; B Natriuretic Peptide 377.4 05/21/2020: Magnesium 1.8 12/05/2020: BUN 25; Creatinine, Ser 1.51; Hemoglobin 11.0; Platelets 254; Potassium 4.3; Sodium 139    Lipid Panel     Component Value Date/Time   CHOL 125 05/19/2020 0427  TRIG 127 05/19/2020 0427   HDL 29 (L) 05/19/2020 0427   CHOLHDL 4.3 05/19/2020 0427   VLDL 25 05/19/2020 0427   LDLCALC 71 05/19/2020 0427     Wt Readings from Last 3 Encounters:  01/07/21 183 lb 12.8 oz (83.4 kg)  12/14/20 186 lb 6.4 oz (84.6 kg)  12/07/20 180 lb (81.6 kg)      Other studies Reviewed: Additional studies/ records that were reviewed today include: TTE 05/19/20  Review of the above records today demonstrates:   1. Left ventricular ejection fraction, by estimation, is 60 to 65%. The  left ventricle has normal function. The left ventricle has no regional  wall motion abnormalities. Left ventricular diastolic parameters are  consistent with Grade II diastolic  dysfunction (pseudonormalization). Elevated left atrial pressure.   2. Right ventricular systolic function is normal. The right ventricular  size is normal. There is normal pulmonary artery systolic pressure. The  estimated right ventricular systolic pressure is 06.3 mmHg.   3. Left atrial size was mild to moderately dilated.   4. Right atrial size was mildly dilated.   5. The mitral valve is normal in structure. Trivial mitral valve  regurgitation. No evidence of mitral stenosis.   6. The  aortic valve is normal in structure. Aortic valve regurgitation is  trivial. Mild aortic valve sclerosis is present, with no evidence of  aortic valve stenosis.   7. The inferior vena cava is normal in size with greater than 50%  respiratory variability, suggesting right atrial pressure of 3 mmHg.    ASSESSMENT AND PLAN:  1.  Paroxysmal atrial fibrillation: Currently on Eliquis 5 mg twice daily.  CHA2DS2-VASc of at least 4.  He is status post atrial fibrillation ablation 12/07/2020.***  2.  Coronary artery disease: Nonobstructive on most recent catheterization.  Currently on Plavix.  Plan per [facility] cardiology.  3.  Hypertension:***  4.  Peripheral arterial disease, that is post multiple stents with a femoropopliteal bypass.  Plan per vascular surgery   Current medicines are reviewed at length with the patient today.   The patient does not have concerns regarding his medicines.  The following changes were made today:  ***  Labs/ tests ordered today include:  No orders of the defined types were placed in this encounter.    Disposition:   FU with [facility] *** months  Signed, Aleenah Homen Meredith Leeds, MD  03/12/2021 2:15 PM     [place] [address] [phone] (office) [phone] (fax)

## 2021-10-09 ENCOUNTER — Telehealth: Payer: Self-pay | Admitting: Radiation Oncology

## 2021-10-09 NOTE — Telephone Encounter (Signed)
4/19 @ 1:28 pm Left voicemail for [facility]) so she is aware that Mr. 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 Berkley Harvey for follow up -per Bryson Ha. ?

## 2021-10-09 NOTE — Telephone Encounter (Signed)
4/19 @ 11:50 am spoke to [place], this pt does not have a f/u appt with anyone in RadOnc at the [place] and Dr. Berkley Harvey in Pulmonary ordered the CT that was done this year, unfortunately he does not have a f/u appt with him, according to pt's records.  If you would like to continue his care with Dr. Lisbeth Renshaw to send in that f/u request-per [facility].   ?

## 2021-10-09 NOTE — Telephone Encounter (Signed)
4/18 Left voicemail with [facility]) to request recent office notes, CT and/or PET scan from Dr. Bobbe Medico office for pt's visit in the fall -per Bryson Ha.  4/19 @ 8:55 received voicemail from [place] ([place]) stated that Mr. leng montesdeoca has expired and we will need a f/u referral request for services form from RadOnc to release any records for f/u care.  4/19 @ 10:50 am left voicemail with [facility], if they will be doing f/u care for patient's lung nodules at the [facility].  Waiting on call back.   ?

## 2021-12-02 ENCOUNTER — Other Ambulatory Visit: Payer: Self-pay | Admitting: *Deleted

## 2021-12-02 DIAGNOSIS — I998 Other disorder of circulatory system: Secondary | ICD-10-CM

## 2021-12-02 DIAGNOSIS — I739 Peripheral vascular disease, unspecified: Secondary | ICD-10-CM

## 2021-12-10 ENCOUNTER — Ambulatory Visit (HOSPITAL_COMMUNITY)
Admission: RE | Admit: 2021-12-10 | Discharge: 2021-12-10 | Disposition: A | Discharge status: Home / Self Care | Payer: Medicare Other | Source: Ambulatory Visit | Attending: Vascular Surgery | Admitting: Vascular Surgery

## 2021-12-10 ENCOUNTER — Ambulatory Visit (INDEPENDENT_AMBULATORY_CARE_PROVIDER_SITE_OTHER)
Admission: RE | Admit: 2021-12-10 | Discharge: 2021-12-10 | Disposition: A | Discharge status: Home / Self Care | Payer: Medicare Other | Source: Ambulatory Visit | Attending: Vascular Surgery | Admitting: Vascular Surgery

## 2021-12-10 ENCOUNTER — Encounter: Payer: Self-pay | Admitting: Vascular Surgery

## 2021-12-10 ENCOUNTER — Ambulatory Visit (INDEPENDENT_AMBULATORY_CARE_PROVIDER_SITE_OTHER): Payer: Medicare Other | Admitting: Vascular Surgery

## 2021-12-10 VITALS — BP 169/67 | HR 51 | Temp 97.5°F | Resp 18 | Ht 69.0 in | Wt 182.0 lb

## 2021-12-10 DIAGNOSIS — I739 Peripheral vascular disease, unspecified: Secondary | ICD-10-CM | POA: Insufficient documentation

## 2021-12-10 DIAGNOSIS — Z9889 Other specified postprocedural states: Secondary | ICD-10-CM

## 2021-12-10 DIAGNOSIS — I998 Other disorder of circulatory system: Secondary | ICD-10-CM | POA: Insufficient documentation

## 2021-12-10 DIAGNOSIS — I70229 Atherosclerosis of native arteries of extremities with rest pain, unspecified extremity: Secondary | ICD-10-CM | POA: Diagnosis not present

## 2021-12-10 NOTE — Progress Notes (Signed)
Patient name: Donald Gordon MRN: [id number] DOB: 01/12/1941 Sex: male  REASON FOR VISIT: Surveillance of right lower extremity bypass  HPI: Donald Gordon is a 81 y.o. male who presents for surveillance of right lower extremity bypass.  He has a very complex history.  Initially had a right common femoral to above-knee popliteal bypass with iliac stenting.  Records show in 2019 he had acute limb ischemia of the right leg that required thrombectomy of his tibials and a right common femoral to below-knee popliteal bypass by Dr. Bridgett Larsson with propaten.  Ultimately his bypass later occluded in February 2021 and he had stent of his right TP trunk up through the bypass with Viabahn's by Dr. [name].  He then reoccluded this and in March 2021 had thrombolysis of the stented bypass and this required proximal stent of his bypass with Eluvia as well as the distal stent into the TP trunk with a Tigris again by Dr. [name].  Apparently had some interim work done on the right leg at the [place] as well.  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  His last thrombolysis was in December 2021 requiring DCB of TP trunk.  He states he is taking Plavix and Eliquis.  Referral states clearance for a colonoscopy with him on 2 blood thinners.  Of note last time he came off blood thinners in 2021 he immediately clotted off his right lower extremity.  States otherwise right leg not bothering him.   Past Medical History:  Diagnosis Date   Arthritis    "knees, elbows" (03/18/2018)   Chronic edema    a. Chronic RLE edema.   COPD (chronic obstructive pulmonary disease) (HCC)    Coronary artery disease    a. MI s/p balloon 1996, details unclear.   Dysrhythmia    PROXIMAL MARGIN FIBRILATION   GERD (gastroesophageal reflux disease)    Hyperlipidemia    Hypertension    Myocardial  infarction ([place]) 1996   PAD (peripheral artery disease) ([place])    a. s/p stenting 08/2012, 02/2013.    PAF (paroxysmal atrial fibrillation) ([place])    Sleep apnea    "have mask; have to start wearing it" (03/18/2018)   Stroke [facility]) ~ 2014   denies residual on 03/18/2018   Thrombosis of lower extremity    a. Listed on patient's medical bracelet - at [place].   Type II diabetes mellitus ([place])     Past Surgical History:  Procedure Laterality Date   ABDOMINAL AORTOGRAM W/LOWER EXTREMITY N/A 07/25/2019   Procedure: ABDOMINAL AORTOGRAM W/LOWER EXTREMITY;  Surgeon: Waynetta Sandy, MD;  Location: [place] [facility];  Service: Cardiovascular;  Laterality: N/A;   ANGIOPLASTY Right 06/03/2020   Procedure: BALLOON ANGIOPLASTY POPITEAL ARTERY;  Surgeon: Cherre Robins, MD;  Location: [facility];  Service: Vascular;  Laterality: Right;   ANTERIOR LUMBAR FUSION  1981   APPENDECTOMY     APPLICATION OF WOUND VAC Right 01/09/2018   Procedure: APPLICATION OF WOUND VAC ON RIGHT LOWER LEG;  Surgeon: Conrad Riverside, MD;  Location: [place];  Service: Vascular;  Laterality: Right;   ATRIAL FIBRILLATION ABLATION N/A 12/07/2020   Procedure: ATRIAL FIBRILLATION ABLATION;  Surgeon: Constance Haw, MD;  Location: [place] [facility];  Service: Cardiovascular;  Laterality: N/A;   BACK SURGERY     BUBBLE STUDY  12/07/2020  Procedure: BUBBLE STUDY;  Surgeon: [name], MD;  Location: [place];  Service: Cardiovascular;;   CATARACT EXTRACTION W/ INTRAOCULAR LENS  IMPLANT, BILATERAL Bilateral    CORONARY ANGIOPLASTY WITH STENT PLACEMENT     "I've got 1 stent in there" (03/18/2018)   FASCIOTOMY Right 01/09/2018   Procedure: FASCIOTOMY RIGHT LOWER LEG;  Surgeon: Conrad [place] Rock, MD;  Location: [place];  Service: Vascular;  Laterality: Right;   FASCIOTOMY Right 06/04/2020   Procedure: RIGHT LOWER EXTREMITY FASCIOTOMY;  Surgeon: Elam Dutch, MD;  Location: [place];  Service: Vascular;  Laterality: Right;    FASCIOTOMY CLOSURE Right 01/11/2018   Procedure: FASCIOTOMY CLOSURE RIGHT CALF;  Surgeon: Conrad Buxton, MD;  Location: [place];  Service: Vascular;  Laterality: Right;   FEMORAL-POPLITEAL BYPASS GRAFT Right 01/09/2018   Procedure: RIGHT FEMORAL-POPLITEAL ARTERY BYPASS USING PROPATEN 6MM X 80CM VASCULAR GRAFT;  Surgeon: Conrad McMullin, MD;  Location: [place];  Service: Vascular;  Laterality: Right;   HEMORRHOID SURGERY     LEFT HEART CATH AND CORONARY ANGIOGRAPHY N/A 03/19/2018   Procedure: LEFT HEART CATH AND CORONARY ANGIOGRAPHY;  Surgeon: Martinique, Peter M, MD;  Location: [place] [facility];  Service: Cardiovascular;  Laterality: N/A;   LOWER EXTREMITY ANGIOGRAM Right 06/02/2020   Procedure: RIGHT LOWER EXTREMITY ANGIOGRAM INTERVENTION;  Surgeon: Cherre Robins, MD;  Location: [place];  Service: Vascular;  Laterality: Right;   LOWER EXTREMITY ANGIOGRAM Right 06/03/2020   Procedure: RIGHT LOWER EXTREMITY ANGIOGRAM from [place];  Surgeon: Cherre Robins, MD;  Location: [place];  Service: Vascular;  Laterality: Right;   LOWER EXTREMITY ANGIOGRAPHY N/A 08/31/2019   Procedure: LOWER EXTREMITY ANGIOGRAPHY;  Surgeon: [name], MD;  Location: [place] [facility];  Service: Cardiovascular;  Laterality: N/A;   LOWER EXTREMITY ANGIOGRAPHY Right 01/18/2020   Procedure: LOWER EXTREMITY ANGIOGRAPHY;  Surgeon: [name], MD;  Location: [place] [facility];  Service: Cardiovascular;  Laterality: Right;   PATCH ANGIOPLASTY Right 01/09/2018   Procedure: PATCH ANGIOPLASTY USING Rueben Bash BIOLOGIC 1CM X 6CM PATCH;  Surgeon: Conrad Dayton, MD;  Location: [place];  Service: Vascular;  Laterality: Right;   PERIPHERAL VASCULAR ATHERECTOMY Right 01/19/2020   Procedure: PERIPHERAL VASCULAR ATHERECTOMY;  Surgeon: [name], MD;  Location: [place] [facility];  Service: Cardiovascular;  Laterality: Right;  Femoral popliteal and tibioperoneal arteries.   PERIPHERAL VASCULAR BALLOON ANGIOPLASTY Right  09/01/2019   Procedure: PERIPHERAL VASCULAR BALLOON ANGIOPLASTY;  Surgeon: Waynetta Sandy, MD;  Location: [place] [facility];  Service: Cardiovascular;  Laterality: Right;  peroneal artery.   PERIPHERAL VASCULAR INTERVENTION Right 07/25/2019   Procedure: PERIPHERAL VASCULAR INTERVENTION;  Surgeon: Waynetta Sandy, MD;  Location: [place] [facility];  Service: Cardiovascular;  Laterality: Right;  SFA X 3   PERIPHERAL VASCULAR INTERVENTION Right 09/01/2019   Procedure: PERIPHERAL VASCULAR INTERVENTION;  Surgeon: Waynetta Sandy, MD;  Location: [place] [facility];  Service: Cardiovascular;  Laterality: Right;  superficial femoral   PERIPHERAL VASCULAR THROMBECTOMY Right 09/01/2019   Procedure: PERIPHERAL VASCULAR THROMBECTOMY;  Surgeon: Waynetta Sandy, MD;  Location: [place] [facility];  Service: Cardiovascular;  Laterality: Right;   PERIPHERAL VASCULAR THROMBECTOMY Right 01/18/2020   Procedure: PERIPHERAL VASCULAR THROMBECTOMY;  Surgeon: [name], MD;  Location: [place] [facility];  Service: Cardiovascular;  Laterality: Right;   PERIPHERAL VASCULAR THROMBECTOMY N/A 01/19/2020   Procedure: LYSIS RECHECK;  Surgeon: [name], MD;  Location: [facility] INVASIVE CV  LAB;  Service: Cardiovascular;  Laterality: N/A;   POSTERIOR LUMBAR FUSION  1978   TEE WITHOUT CARDIOVERSION N/A 12/07/2020   Procedure: TRANSESOPHAGEAL ECHOCARDIOGRAM (TEE);  Surgeon: [name], MD;  Location: [facility] ENDOSCOPY;  Service: Cardiovascular;  Laterality: N/A;   THROMBECTOMY FEMORAL ARTERY Right 01/09/2018   Procedure: THROMBECTOMY RIGHT LOWER LEG;  Surgeon: Conrad [place], MD;  Location: [facility];  Service: Vascular;  Laterality: Right;   TONSILLECTOMY      Family History  Problem Relation Age of Onset   CAD Mother    CAD Brother    Hypertension Other     SOCIAL HISTORY: Social History   Tobacco Use   Smoking status: Former    Packs/day: 3.00    Years: 30.00    Total  pack years: 90.00    Types: Cigarettes    Quit date: 1996    Years since quitting: 27.4   Smokeless tobacco: Never  Substance Use Topics   Alcohol use: No    Comment: Remote alcohol use    Allergies  Allergen Reactions   Oxycodone Nausea And Vomiting   Statins Other (See Comments)    Muscle and Bone pain    Amiodarone     Caused issues and was stopped by MD- possible was the cause for the patient's heart "going in and out of rhythm" multiple times   Donepezil Other (See Comments)    Per [facility] Insomnia   Imipramine Other (See Comments)    Per [facility]   Oxybutynin Chloride Swelling and Other (See Comments)    Per [facility]    Current Outpatient Medications  Medication Sig Dispense Refill   acetaminophen (TYLENOL) 500 MG tablet Take 500 mg by mouth 4 (four) times daily as needed for moderate pain or headache.     albuterol (VENTOLIN HFA) 108 (90 Base) MCG/ACT inhaler Inhale into the lungs.     atorvastatin (LIPITOR) 40 MG tablet Take 1 tablet (40 mg total) by mouth daily at 6 PM. 30 tablet 1   Cholecalciferol (VITAMIN D) 50 MCG (2000 UT) tablet Take 2,000 Units by mouth daily.     clopidogrel (PLAVIX) 75 MG tablet Take 1 tablet by mouth daily.     ezetimibe (ZETIA) 10 MG tablet Take 10 mg by mouth every evening.      ferrous sulfate 325 (65 FE) MG tablet Take 325 mg by mouth daily with breakfast.     hydrochlorothiazide (MICROZIDE) 12.5 MG capsule Take 12.5 mg by mouth daily.     memantine (NAMENDA) 10 MG tablet Take 20 mg by mouth at bedtime.     metoprolol tartrate (LOPRESSOR) 50 MG tablet Take 50 mg by mouth 2 (two) times daily.     nitroGLYCERIN (NITROSTAT) 0.4 MG SL tablet Place 0.4 mg under the tongue every 5 (five) minutes x 3 doses as needed for chest pain.     pantoprazole (PROTONIX) 40 MG tablet Take 40 mg by mouth daily before breakfast.     ramipril (ALTACE) 10 MG capsule Take 10 mg by mouth 2 (two) times daily.     rivastigmine (EXELON) 6 MG capsule Take 6 mg by mouth 2  (two) times daily.     Tiotropium Bromide-Olodaterol 2.5-2.5 MCG/ACT AERS Take by mouth.     vitamin B-12 (CYANOCOBALAMIN) 500 MCG tablet Take 500 mcg by mouth daily.     apixaban (ELIQUIS) 2.5 MG TABS tablet TAKE 1 TABLET (2.5 MG TOTAL) BY MOUTH TWO TIMES DAILY. 60 tablet 3   No current  facility-administered medications for this visit.    REVIEW OF SYSTEMS:  [X]  denotes positive finding, [ ]  denotes negative finding Cardiac  Comments:  Chest pain or chest pressure:    Shortness of breath upon exertion:    Short of breath when lying flat:    Irregular heart rhythm:        Vascular    Pain in calf, thigh, or hip brought on by ambulation:    Pain in feet at night that wakes you up from your sleep:     Blood clot in your veins:    Leg swelling:         Pulmonary    Oxygen at home:    Productive cough:     Wheezing:         Neurologic    Sudden weakness in arms or legs:     Sudden numbness in arms or legs:     Sudden onset of difficulty speaking or slurred speech:    Temporary loss of vision in one eye:     Problems with dizziness:         Gastrointestinal    Blood in stool:     Vomited blood:         Genitourinary    Burning when urinating:     Blood in urine:        Psychiatric    Major depression:         Hematologic    Bleeding problems:    Problems with blood clotting too easily:        Skin    Rashes or ulcers:        Constitutional    Fever or chills:      PHYSICAL EXAM: Vitals:   12/10/21 1150  BP: (!) 169/67  Pulse: (!) 51  Resp: 18  Temp: (!) 97.5 F (36.4 C)  TempSrc: Temporal  SpO2: 93%  Weight: 182 lb (82.6 kg)  Height: 5\' 9"  (1.753 m)    GENERAL: The patient is a well-nourished male, in no acute distress. The vital signs are documented above. CARDIAC: There is a regular rate and rhythm.  VASCULAR:  Femoral pulses palpable bilaterally Right PT palpable No right lower extremity tissue loss PULMONARY: No respiratory  distress. ABDOMEN: Soft and non-tender.  DATA:   Right lower extremity arterial duplex today shows a stable moderate 50 to 70% stenosis in the distal bypass graft with velocity 180.  ABI's on the right stable 0.94 biphasic  Assessment/Plan:  81 year old male with very complex revascularization history in the right lower extremity as noted above in the HPI.  He has presented multiple times with acute thrombosis of his right lower extremity requiring emergent intervention including fasciotomies.  Bypass remains patent on duplex today with a fairly stable moderate stenosis at the distal anastomosis with a max velocity of 180.  ABIs are preserved.  He has a palpable pedal pulse on exam.  Remains on Plavix and Eliquis.  [facility].  I discussed he is exceedingly high risk to stop his anticoagulation which he understands given every time he has done this in the past he has presented to the hospital with threat of limb loss.  I discussed all of this with him.  I will see him again in 6 months with right leg arterial duplex for continued surveillance.  Sounds like he is favoring delaying the colonoscopy for now, but ultimately it will be  his decision.   [name], MD [facility] [place] Office: [phone]

## 2021-12-12 ENCOUNTER — Other Ambulatory Visit: Payer: Self-pay

## 2021-12-12 DIAGNOSIS — I739 Peripheral vascular disease, unspecified: Secondary | ICD-10-CM

## 2022-06-24 IMAGING — CR DG CHEST 2V
2 series · 2 of 2 positions shown · non-contrast
Comparison: PET-CT 04/10/2020.  Chest x-ray [DATE].

CLINICAL DATA: Chest pain.

EXAM:
CHEST - 2 VIEW

[chest pa]
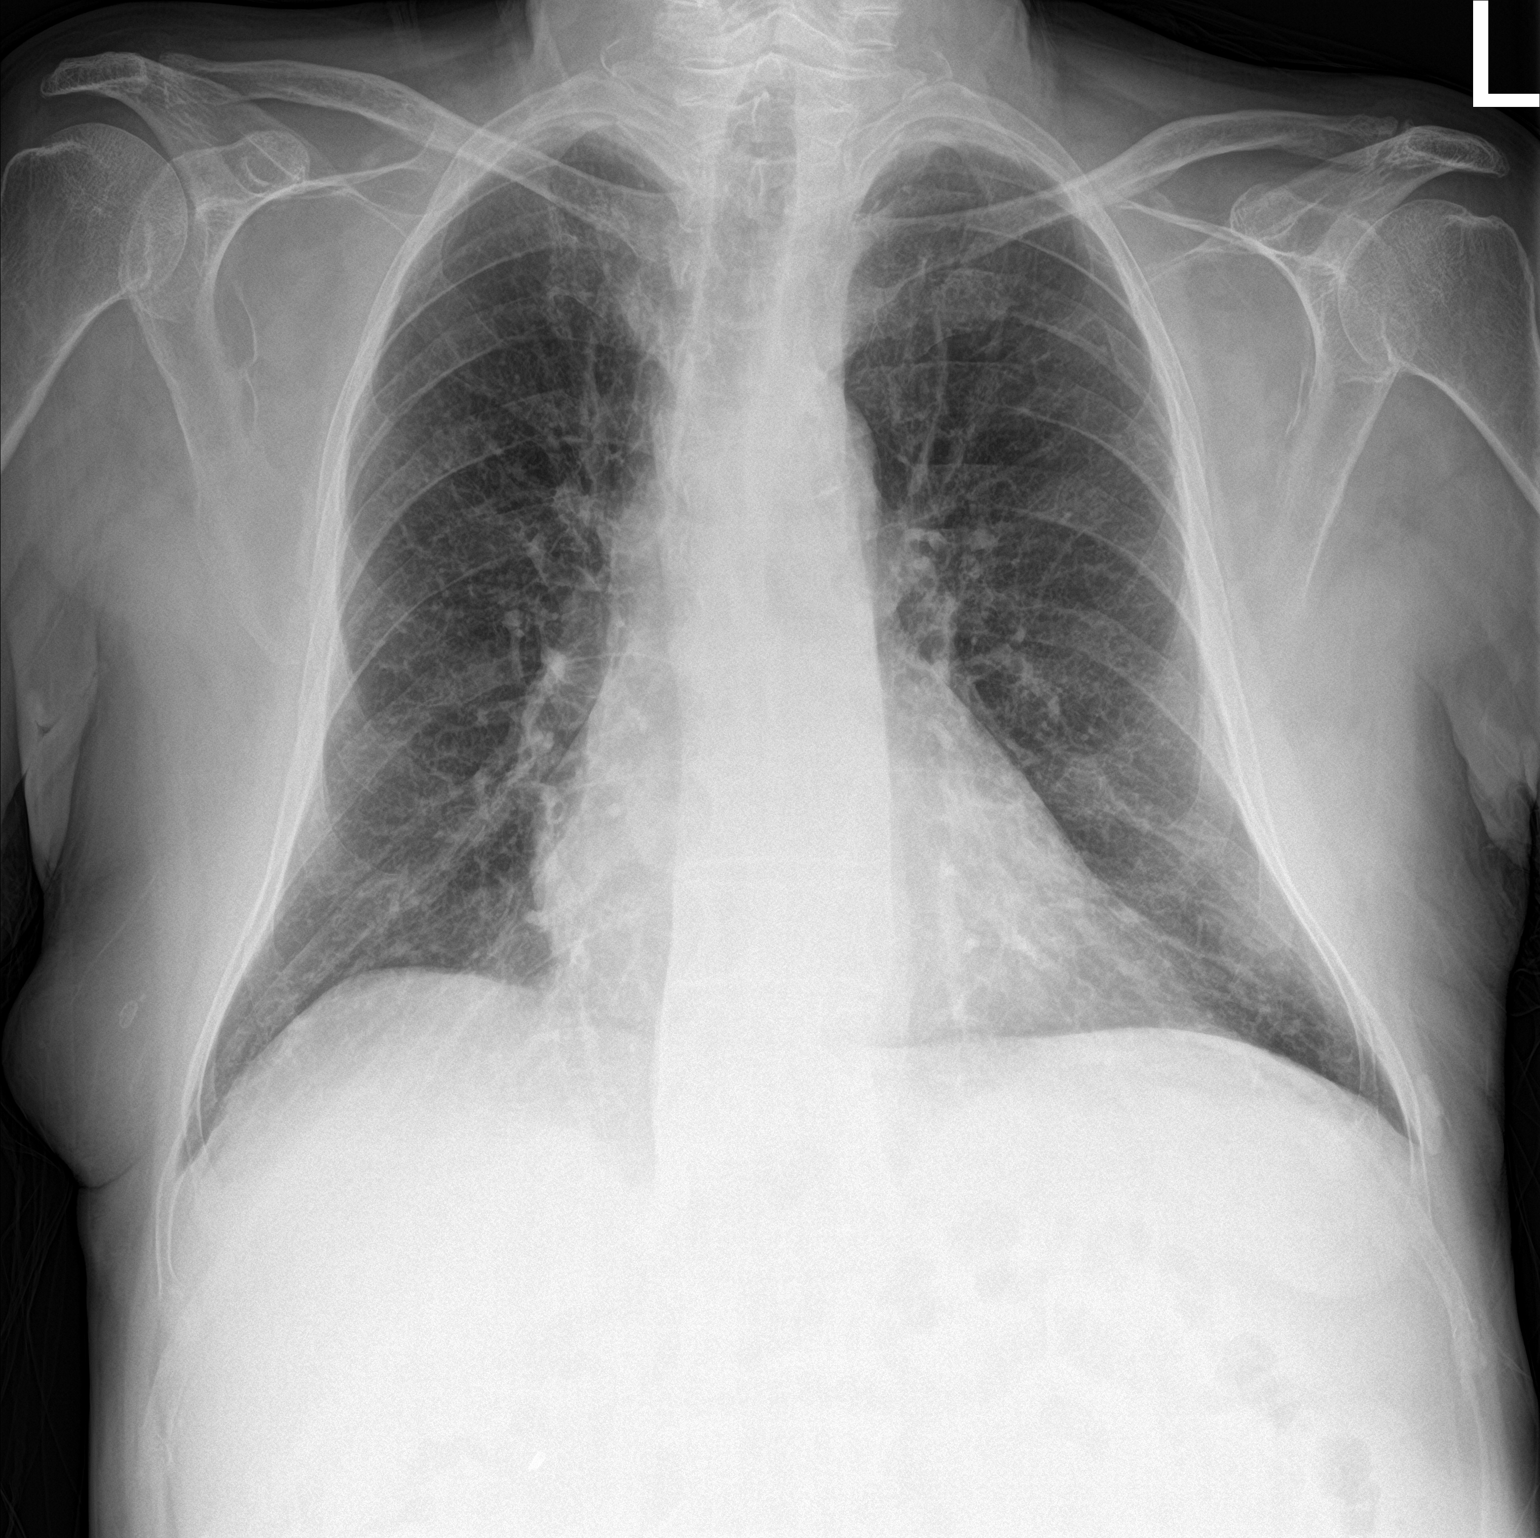

[chest lat]
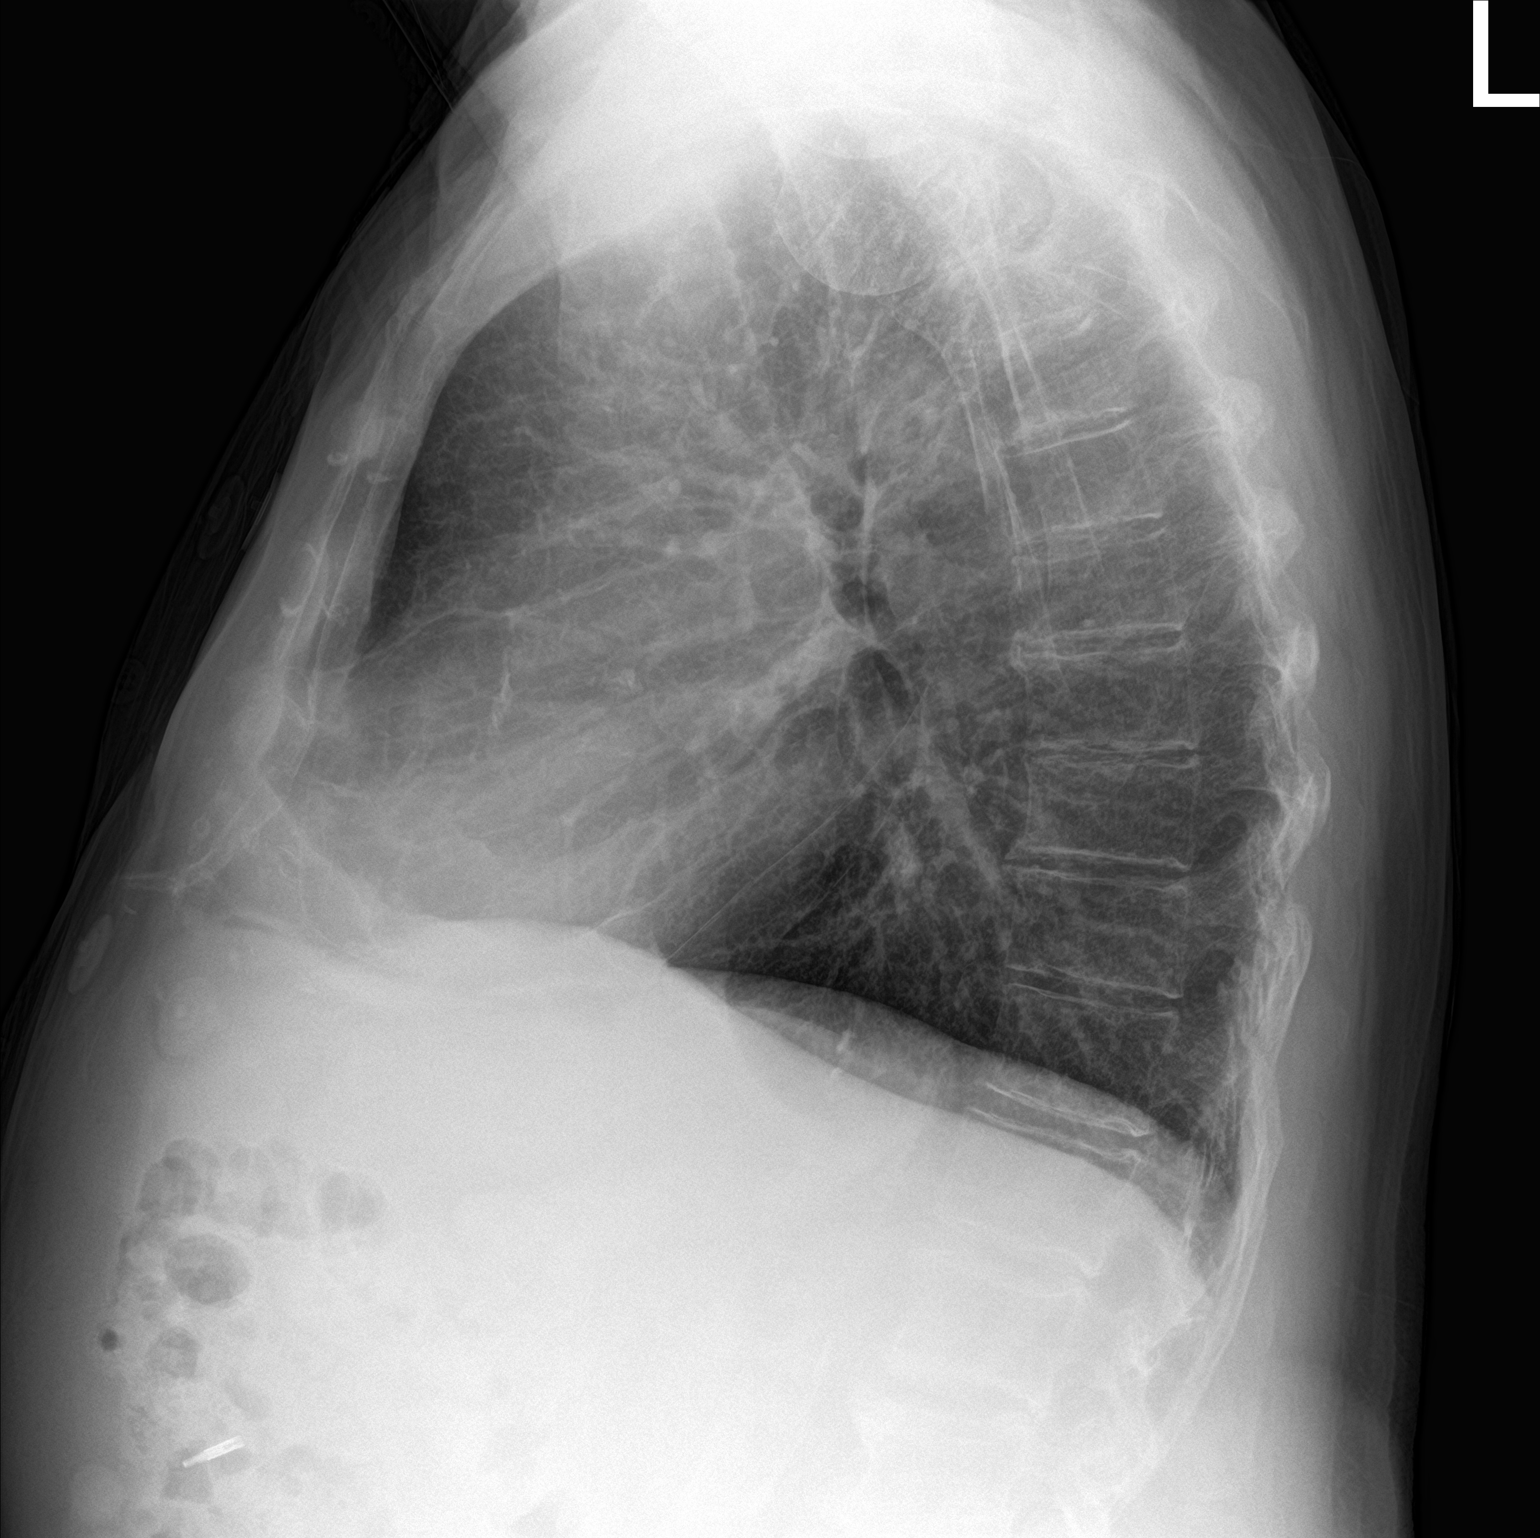

[2 of 2 positions shown; findings below may reference images not displayed]

FINDINGS: Mediastinum and hilar structures normal. Heart size normal.
Previously identified spiculated nodule in the medial right lung
base best identified by prior PET-CT. No acute infiltrate noted. No
pleural effusion or pneumothorax. Metallic density small radiopacity
noted over the anterior abdomen. Degenerative change in scoliosis
thoracic spine.
IMPRESSION: 1. Previously identified spiculated nodular density in the medial
right lung base best identified by prior PET-CT. 2. No acute
abnormality identified.

## 2022-07-09 IMAGING — DX DG FOOT COMPLETE 3+V*R*
3 series · 3 of 3 positions shown · non-contrast
Comparison: None.

CLINICAL DATA: Pain

EXAM:
RIGHT FOOT COMPLETE - 3+ VIEW

[foot ap]
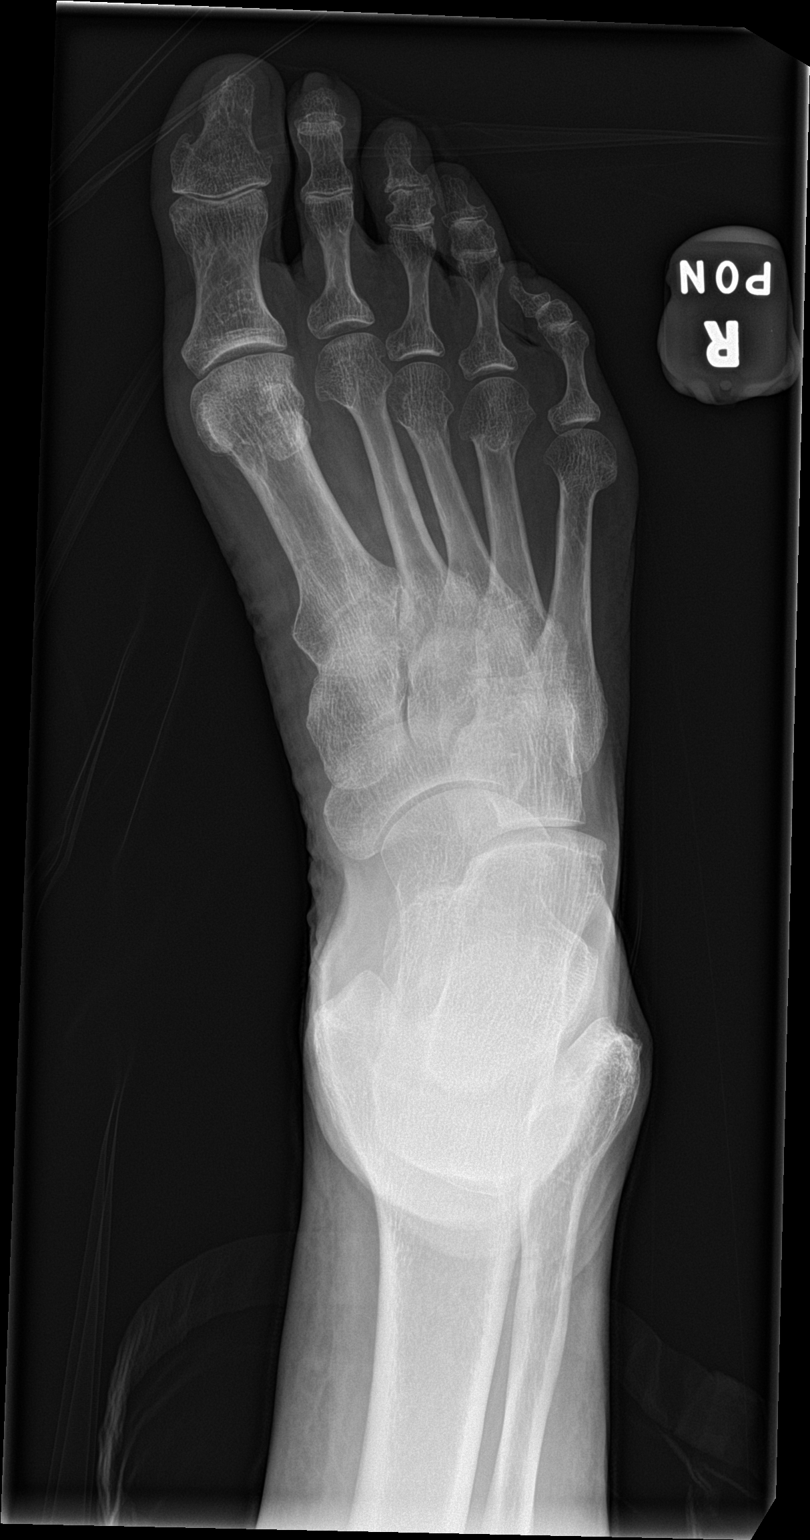

[foot obl]
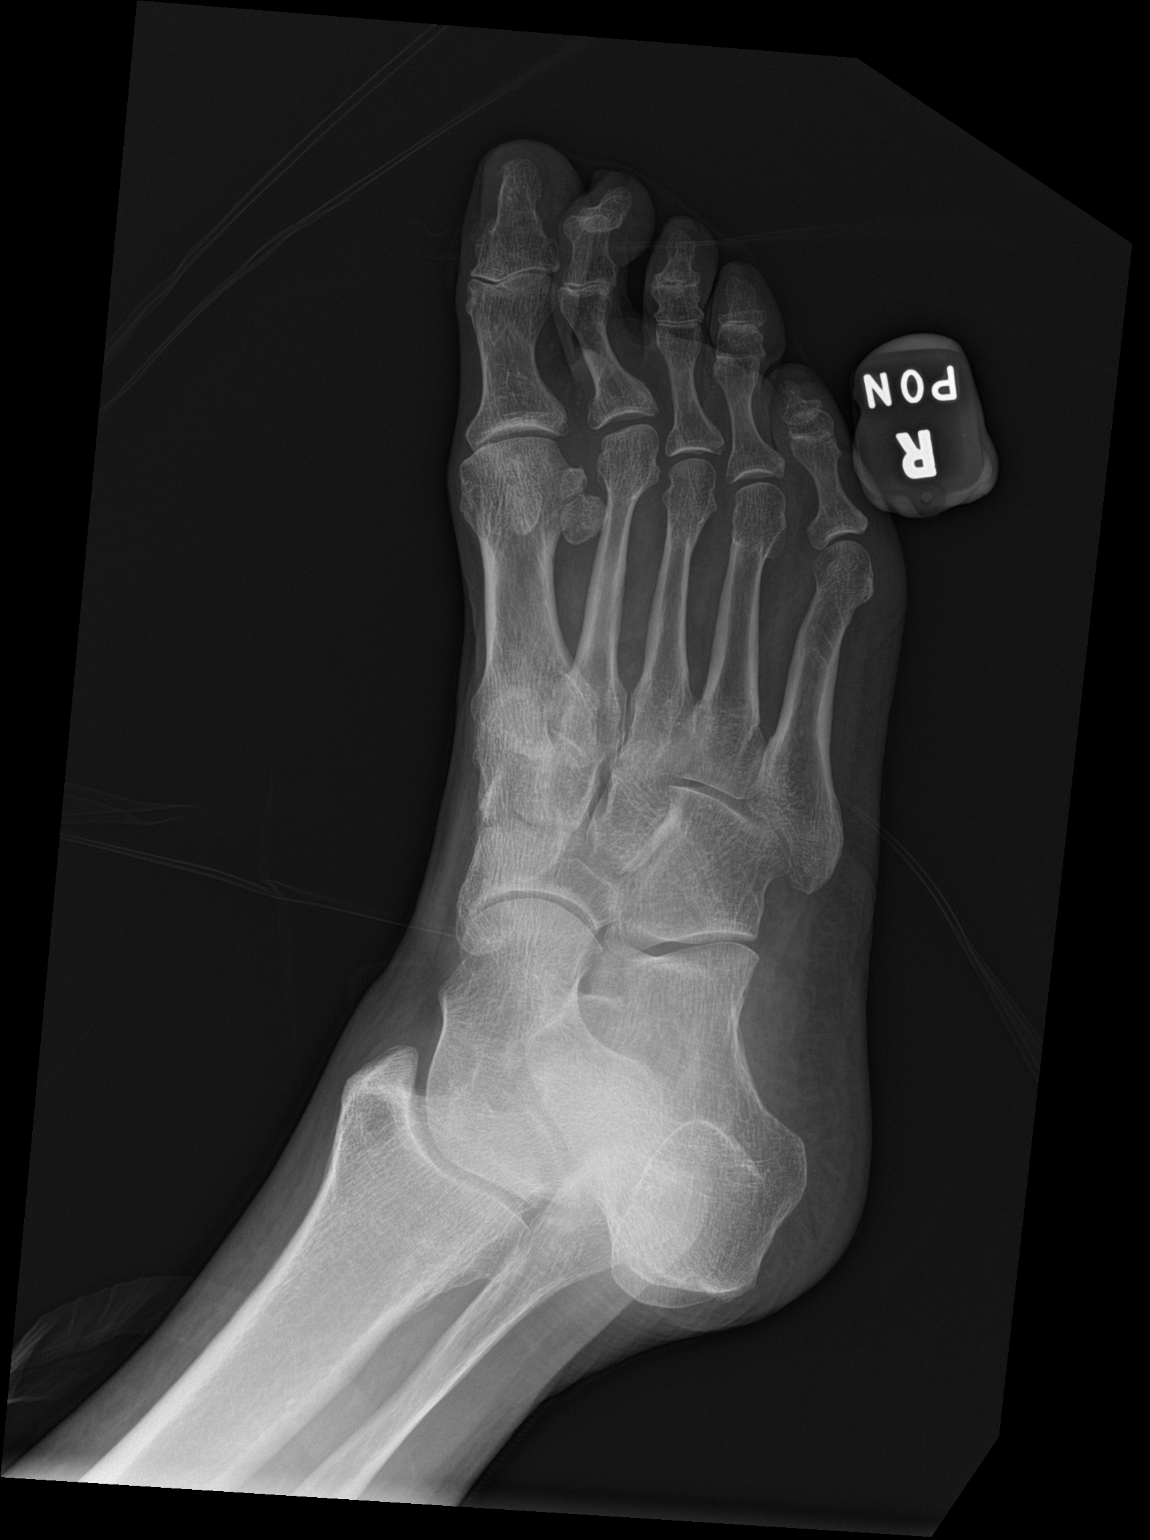

[foot lat]
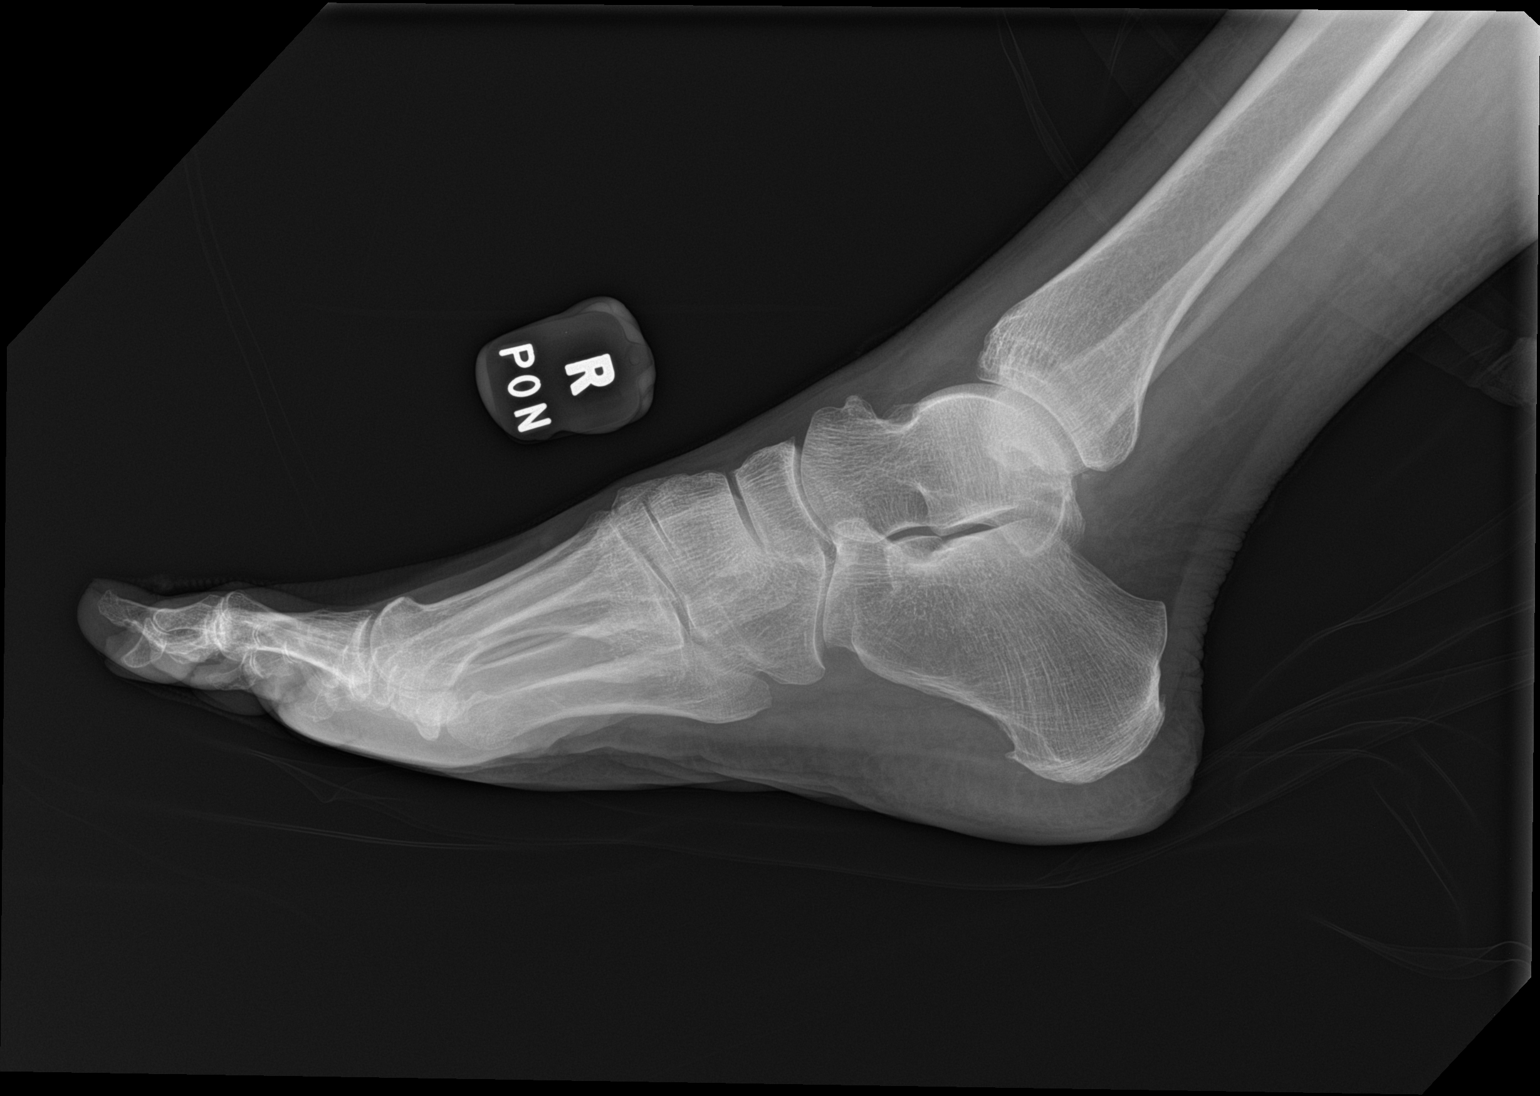

[3 of 3 positions shown; findings below may reference images not displayed]

FINDINGS: No acute bony abnormality. Specifically, no fracture, subluxation,
or dislocation.
IMPRESSION: No acute bony abnormality.

## 2022-09-10 ENCOUNTER — Inpatient Hospital Stay (HOSPITAL_COMMUNITY)
Admit: 2022-09-10 | Discharge: 2022-09-13 | DRG: 315 | Disposition: A | Discharge status: Home / Self Care | Payer: No Typology Code available for payment source | Source: Other Acute Inpatient Hospital | Attending: Vascular Surgery | Admitting: Vascular Surgery

## 2022-09-10 ENCOUNTER — Encounter (HOSPITAL_COMMUNITY): Payer: Self-pay

## 2022-09-10 DIAGNOSIS — Z79899 Other long term (current) drug therapy: Secondary | ICD-10-CM

## 2022-09-10 DIAGNOSIS — Z86718 Personal history of other venous thrombosis and embolism: Secondary | ICD-10-CM | POA: Diagnosis not present

## 2022-09-10 DIAGNOSIS — I130 Hypertensive heart and chronic kidney disease with heart failure and stage 1 through stage 4 chronic kidney disease, or unspecified chronic kidney disease: Secondary | ICD-10-CM | POA: Diagnosis present

## 2022-09-10 DIAGNOSIS — K219 Gastro-esophageal reflux disease without esophagitis: Secondary | ICD-10-CM | POA: Diagnosis present

## 2022-09-10 DIAGNOSIS — N400 Enlarged prostate without lower urinary tract symptoms: Secondary | ICD-10-CM | POA: Diagnosis present

## 2022-09-10 DIAGNOSIS — E1151 Type 2 diabetes mellitus with diabetic peripheral angiopathy without gangrene: Secondary | ICD-10-CM | POA: Diagnosis present

## 2022-09-10 DIAGNOSIS — M170 Bilateral primary osteoarthritis of knee: Secondary | ICD-10-CM | POA: Diagnosis present

## 2022-09-10 DIAGNOSIS — Y713 Surgical instruments, materials and cardiovascular devices (including sutures) associated with adverse incidents: Secondary | ICD-10-CM | POA: Diagnosis present

## 2022-09-10 DIAGNOSIS — I70621 Atherosclerosis of nonbiological bypass graft(s) of the extremities with rest pain, right leg: Secondary | ICD-10-CM | POA: Diagnosis present

## 2022-09-10 DIAGNOSIS — T82818A Embolism of vascular prosthetic devices, implants and grafts, initial encounter: Secondary | ICD-10-CM | POA: Diagnosis present

## 2022-09-10 DIAGNOSIS — Z7902 Long term (current) use of antithrombotics/antiplatelets: Secondary | ICD-10-CM

## 2022-09-10 DIAGNOSIS — Z981 Arthrodesis status: Secondary | ICD-10-CM | POA: Diagnosis not present

## 2022-09-10 DIAGNOSIS — J449 Chronic obstructive pulmonary disease, unspecified: Secondary | ICD-10-CM

## 2022-09-10 DIAGNOSIS — I5032 Chronic diastolic (congestive) heart failure: Secondary | ICD-10-CM | POA: Diagnosis present

## 2022-09-10 DIAGNOSIS — I70221 Atherosclerosis of native arteries of extremities with rest pain, right leg: Secondary | ICD-10-CM | POA: Diagnosis present

## 2022-09-10 DIAGNOSIS — Z8673 Personal history of transient ischemic attack (TIA), and cerebral infarction without residual deficits: Secondary | ICD-10-CM

## 2022-09-10 DIAGNOSIS — Z7901 Long term (current) use of anticoagulants: Secondary | ICD-10-CM | POA: Diagnosis not present

## 2022-09-10 DIAGNOSIS — I480 Paroxysmal atrial fibrillation: Secondary | ICD-10-CM | POA: Diagnosis present

## 2022-09-10 DIAGNOSIS — M19021 Primary osteoarthritis, right elbow: Secondary | ICD-10-CM | POA: Diagnosis present

## 2022-09-10 DIAGNOSIS — M19022 Primary osteoarthritis, left elbow: Secondary | ICD-10-CM | POA: Diagnosis present

## 2022-09-10 DIAGNOSIS — I252 Old myocardial infarction: Secondary | ICD-10-CM

## 2022-09-10 DIAGNOSIS — I2510 Atherosclerotic heart disease of native coronary artery without angina pectoris: Secondary | ICD-10-CM | POA: Diagnosis present

## 2022-09-10 DIAGNOSIS — Z87891 Personal history of nicotine dependence: Secondary | ICD-10-CM

## 2022-09-10 DIAGNOSIS — Z8249 Family history of ischemic heart disease and other diseases of the circulatory system: Secondary | ICD-10-CM

## 2022-09-10 DIAGNOSIS — E1122 Type 2 diabetes mellitus with diabetic chronic kidney disease: Secondary | ICD-10-CM | POA: Diagnosis present

## 2022-09-10 DIAGNOSIS — I743 Embolism and thrombosis of arteries of the lower extremities: Secondary | ICD-10-CM | POA: Diagnosis present

## 2022-09-10 DIAGNOSIS — T82868A Thrombosis of vascular prosthetic devices, implants and grafts, initial encounter: Secondary | ICD-10-CM | POA: Diagnosis present

## 2022-09-10 DIAGNOSIS — Z955 Presence of coronary angioplasty implant and graft: Secondary | ICD-10-CM | POA: Diagnosis not present

## 2022-09-10 DIAGNOSIS — I10 Essential (primary) hypertension: Secondary | ICD-10-CM | POA: Diagnosis present

## 2022-09-10 DIAGNOSIS — N1831 Chronic kidney disease, stage 3a: Secondary | ICD-10-CM | POA: Diagnosis present

## 2022-09-10 DIAGNOSIS — E7800 Pure hypercholesterolemia, unspecified: Secondary | ICD-10-CM | POA: Diagnosis present

## 2022-09-10 LAB — COMPREHENSIVE METABOLIC PANEL
ALT: 22 U/L (ref 0–44)
AST: 15 U/L (ref 15–41)
Albumin: 3.5 g/dL (ref 3.5–5.0)
Alkaline Phosphatase: 73 U/L (ref 38–126)
Anion gap: 10 (ref 5–15)
BUN: 17 mg/dL (ref 8–23)
CO2: 24 mmol/L (ref 22–32)
Calcium: 8.7 mg/dL — ABNORMAL LOW (ref 8.9–10.3)
Chloride: 104 mmol/L (ref 98–111)
Creatinine, Ser: 1.3 mg/dL — ABNORMAL HIGH (ref 0.61–1.24)
GFR, Estimated: 55 mL/min — ABNORMAL LOW (ref >60)
Glucose, Bld: 104 mg/dL — ABNORMAL HIGH (ref 70–99)
Potassium: 3.2 mmol/L — ABNORMAL LOW (ref 3.5–5.1)
Sodium: 138 mmol/L (ref 135–145)
Total Bilirubin: 0.7 mg/dL (ref 0.3–1.2)
Total Protein: 6.6 g/dL (ref 6.5–8.1)

## 2022-09-10 LAB — CBC WITH DIFFERENTIAL/PLATELET
Abs Immature Granulocytes: 0.06 10*3/uL (ref 0.00–0.07)
Basophils Absolute: 0.1 10*3/uL (ref 0.0–0.1)
Basophils Relative: 1 %
Eosinophils Absolute: 0.1 10*3/uL (ref 0.0–0.5)
Eosinophils Relative: 1 %
HCT: 38 % — ABNORMAL LOW (ref 39.0–52.0)
Hemoglobin: 13.2 g/dL (ref 13.0–17.0)
Immature Granulocytes: 1 %
Lymphocytes Relative: 25 %
Lymphs Abs: 2 10*3/uL (ref 0.7–4.0)
MCH: 32.8 pg (ref 26.0–34.0)
MCHC: 34.7 g/dL (ref 30.0–36.0)
MCV: 94.5 fL (ref 80.0–100.0)
Monocytes Absolute: 1.1 10*3/uL — ABNORMAL HIGH (ref 0.1–1.0)
Monocytes Relative: 14 %
Neutro Abs: 4.8 10*3/uL (ref 1.7–7.7)
Neutrophils Relative %: 58 %
Platelets: 265 10*3/uL (ref 150–400)
RBC: 4.02 MIL/uL — ABNORMAL LOW (ref 4.22–5.81)
RDW: 12.5 % (ref 11.5–15.5)
WBC: 8.1 10*3/uL (ref 4.0–10.5)
nRBC: 0 % (ref 0.0–0.2)

## 2022-09-10 LAB — PROTIME-INR
INR: 1.5 — ABNORMAL HIGH (ref 0.8–1.2)
Prothrombin Time: 17.8 seconds — ABNORMAL HIGH (ref 11.4–15.2)

## 2022-09-10 LAB — TYPE AND SCREEN
ABO/RH(D): A POS
Antibody Screen: NEGATIVE

## 2022-09-10 LAB — MAGNESIUM: Magnesium: 1.8 mg/dL (ref 1.7–2.4)

## 2022-09-10 LAB — HEPARIN LEVEL (UNFRACTIONATED): Heparin Unfractionated: >1.1 IU/mL — ABNORMAL HIGH (ref 0.30–0.70)

## 2022-09-10 MED ORDER — METOPROLOL TARTRATE 50 MG PO TABS
50.0000 mg | ORAL_TABLET | Freq: Two times a day (BID) | ORAL | Status: DC
  Administered 2022-09-10 – 2022-09-13 (×5): 50 mg via ORAL
  Filled 2022-09-10 (×5): qty 1

## 2022-09-10 MED ORDER — PANTOPRAZOLE SODIUM 40 MG PO TBEC
40.0000 mg | DELAYED_RELEASE_TABLET | Freq: Every day | ORAL | Status: DC
  Administered 2022-09-11 – 2022-09-13 (×3): 40 mg via ORAL
  Filled 2022-09-10 (×3): qty 1

## 2022-09-10 MED ORDER — ACETAMINOPHEN 650 MG RE SUPP: 650.0000 mg | Freq: Four times a day (QID) | RECTAL | Status: DC | PRN

## 2022-09-10 MED ORDER — ATORVASTATIN CALCIUM 40 MG PO TABS
40.0000 mg | ORAL_TABLET | Freq: Every day | ORAL | Status: DC
  Administered 2022-09-11 – 2022-09-12 (×2): 40 mg via ORAL
  Filled 2022-09-10 (×2): qty 1

## 2022-09-10 MED ORDER — RIVASTIGMINE TARTRATE 1.5 MG PO CAPS
6.0000 mg | ORAL_CAPSULE | Freq: Two times a day (BID) | ORAL | Status: DC
  Administered 2022-09-10 – 2022-09-13 (×5): 6 mg via ORAL
  Filled 2022-09-10 (×8): qty 4

## 2022-09-10 MED ORDER — ALBUTEROL SULFATE (2.5 MG/3ML) 0.083% IN NEBU: 2.5000 mg | INHALATION_SOLUTION | Freq: Four times a day (QID) | RESPIRATORY_TRACT | Status: DC | PRN

## 2022-09-10 MED ORDER — MEMANTINE HCL 10 MG PO TABS
20.0000 mg | ORAL_TABLET | Freq: Every day | ORAL | Status: DC
Start: 2022-09-10 — End: 2022-09-13
  Administered 2022-09-10 – 2022-09-12 (×3): 20 mg via ORAL
  Filled 2022-09-10 (×4): qty 2

## 2022-09-10 MED ORDER — UMECLIDINIUM BROMIDE 62.5 MCG/ACT IN AEPB
1.0000 | INHALATION_SPRAY | Freq: Every day | RESPIRATORY_TRACT | Status: DC
  Administered 2022-09-13: 1 via RESPIRATORY_TRACT
  Filled 2022-09-10 (×2): qty 7

## 2022-09-10 MED ORDER — SODIUM CHLORIDE 0.9 % IV SOLN: INTRAVENOUS | Status: DC

## 2022-09-10 MED ORDER — HYDROCODONE-ACETAMINOPHEN 5-325 MG PO TABS
1.0000 | ORAL_TABLET | ORAL | Status: DC | PRN
  Administered 2022-09-10 – 2022-09-12 (×4): 1 via ORAL
  Filled 2022-09-10 (×4): qty 1

## 2022-09-10 MED ORDER — HEPARIN (PORCINE) 25000 UT/250ML-% IV SOLN
1200.0000 [IU]/h | INTRAVENOUS | Status: DC
  Administered 2022-09-10: 1450 [IU]/h via INTRAVENOUS
  Filled 2022-09-10: qty 250

## 2022-09-10 MED ORDER — MORPHINE SULFATE (PF) 2 MG/ML IV SOLN
2.0000 mg | INTRAVENOUS | Status: DC | PRN
  Administered 2022-09-11: 2 mg via INTRAVENOUS
  Filled 2022-09-10: qty 1

## 2022-09-10 MED ORDER — EZETIMIBE 10 MG PO TABS
10.0000 mg | ORAL_TABLET | Freq: Every evening | ORAL | Status: DC
  Administered 2022-09-10 – 2022-09-12 (×3): 10 mg via ORAL
  Filled 2022-09-10 (×3): qty 1

## 2022-09-10 MED ORDER — HYDRALAZINE HCL 20 MG/ML IJ SOLN
10.0000 mg | INTRAMUSCULAR | Status: DC | PRN
  Administered 2022-09-12 – 2022-09-13 (×2): 10 mg via INTRAVENOUS
  Filled 2022-09-10 (×3): qty 1

## 2022-09-10 MED ORDER — FERROUS SULFATE 325 (65 FE) MG PO TABS
325.0000 mg | ORAL_TABLET | Freq: Every day | ORAL | Status: DC
  Administered 2022-09-11 – 2022-09-13 (×3): 325 mg via ORAL
  Filled 2022-09-10 (×3): qty 1

## 2022-09-10 MED ORDER — ARFORMOTEROL TARTRATE 15 MCG/2ML IN NEBU
15.0000 ug | INHALATION_SOLUTION | Freq: Two times a day (BID) | RESPIRATORY_TRACT | Status: DC
  Administered 2022-09-10 – 2022-09-13 (×5): 15 ug via RESPIRATORY_TRACT
  Filled 2022-09-10 (×6): qty 2

## 2022-09-10 MED ORDER — ONDANSETRON HCL 4 MG/2ML IJ SOLN
4.0000 mg | Freq: Four times a day (QID) | INTRAMUSCULAR | Status: DC | PRN
  Administered 2022-09-12: 4 mg via INTRAVENOUS
  Filled 2022-09-10: qty 2

## 2022-09-10 MED ORDER — CLOPIDOGREL BISULFATE 75 MG PO TABS
75.0000 mg | ORAL_TABLET | Freq: Every day | ORAL | Status: DC
  Administered 2022-09-11 – 2022-09-13 (×3): 75 mg via ORAL
  Filled 2022-09-10 (×3): qty 1

## 2022-09-10 MED ORDER — ACETAMINOPHEN 325 MG PO TABS: 650.0000 mg | ORAL_TABLET | Freq: Four times a day (QID) | ORAL | Status: DC | PRN

## 2022-09-10 MED ORDER — ONDANSETRON HCL 4 MG PO TABS: 4.0000 mg | ORAL_TABLET | Freq: Four times a day (QID) | ORAL | Status: DC | PRN

## 2022-09-10 NOTE — Progress Notes (Signed)
Patient arrived at the unit via care link,heparin infusion at 29 ml/hr,CHG bath given,Vitals checked,CCMD notified,rt dorsalis pedis  pulse absent,MD made aware

## 2022-09-10 NOTE — Consult Note (Signed)
[facility] [place]  Patient name: Donald Gordon MRN: [id number] DOB: [birth year] Sex: male   REQUESTING PROVIDER:   Hospital service   REASON FOR CONSULT:    Occluded right leg bypass  HISTORY OF PRESENT ILLNESS:   Donald Gordon is a 82 y.o. male, who presented to the [facility] emergency department with a 1 day history of pain in his right great toe.  He has an extensive vascular history, having undergone the following procedures:  Unknown previous vascular surgery at a different institution, does have right external iliac stents 01/09/2018: Right common femoral to below-knee popliteal artery bypass graft with PTFE, tibial thrombectomy and 4 compartment fasciotomy for ischemia Bridgett Larsson) 01/11/2018: Fasciotomy closure Bridgett Larsson) 07/25/2019: Restenting of previous PTFE bypass graft with stent to the right tibioperoneal trunk, popliteal artery, SFA with two 5x250 Viabahn distally and a 6 x 50 Viabahn proximally [name], rest pain) 08/31/2019: Initiation of thrombolytic therapy for rest pain and occluded stents Carlis Abbott) 09/01/2019: Penumbra mechanical thrombectomy with stenting of the tibioperoneal trunk using a 5 x 60 [place], balloon angioplasty of the peroneal artery with a 2.5 mm balloon and proximal bypass graft stenting into the common femoral artery with a 6 x 60 Eluvia  The patient has come off anticoagulation in the past which led to bypass graft occlusions.  He was last seen in our office in June 2023 with a moderate stenosis in his distal bypass graft with peak systolic velocity of [id number].  His ABI on the right was 0.94.  He had a palpable posterior tibial pulse  The patient suffers from COPD secondary to former tobacco abuse.  He has a history of coronary artery disease with MI and angioplasty in 1996.  He has a history of stroke with no residual deficits.  He is a type II diabetic.  He takes a statin for  hypercholesterolemia.  He is on Plavix and Eliquis. PAST MEDICAL HISTORY    Past Medical History:  Diagnosis Date   Arthritis    "knees, elbows" (03/18/2018)   Chronic edema    a. Chronic RLE edema.   COPD (chronic obstructive pulmonary disease) (HCC)    Coronary artery disease    a. MI s/p balloon 1996, details unclear.   Dysrhythmia    PROXIMAL MARGIN FIBRILATION   GERD (gastroesophageal reflux disease)    Hyperlipidemia    Hypertension    Myocardial infarction ([place]) 1996   PAD (peripheral artery disease) ([place])    a. s/p stenting 08/2012, 02/2013.    PAF (paroxysmal atrial fibrillation) ([place])    Sleep apnea    "have mask; have to start wearing it" (03/18/2018)   Stroke [facility]) ~ 2014   denies residual on 03/18/2018   Thrombosis of lower extremity    a. Listed on patient's medical bracelet - at [place].   Type II diabetes mellitus ([place])      FAMILY HISTORY   Family History  Problem Relation Age of Onset   CAD Mother    CAD Brother    Hypertension Other     SOCIAL HISTORY:   Social History   Socioeconomic History   Marital status: Legally Separated    Spouse name: Not on file   Number of children: Not on file   Years of education: Not on file   Highest education level: Not on file  Occupational History   Not on file  Tobacco Use   Smoking status: Former    Packs/day: 3.00  Years: 30.00    Additional pack years: 0.00    Total pack years: 90.00    Types: Cigarettes    Quit date: 28    Years since quitting: 28.2   Smokeless tobacco: Never  Vaping Use   Vaping Use: Never used  Substance and Sexual Activity   Alcohol use: No    Comment: Remote alcohol use   Drug use: Never   Sexual activity: Not Currently  Other Topics Concern   Not on file  Social History Narrative   Not on file   Social Determinants of Health   Financial Resource Strain: Not on file  Food Insecurity: No Food Insecurity (05/21/2020)   Hunger Vital Sign    Worried About Running Out  of Food in the Last Year: Never true    Ran Out of Food in the Last Year: Never true  Transportation Needs: No Transportation Needs (05/21/2020)   PRAPARE - Hydrologist (Medical): No    Lack of Transportation (Non-Medical): No  Physical Activity: Not on file  Stress: Not on file  Social Connections: Not on file  Intimate Partner Violence: Not on file    ALLERGIES:    Allergies  Allergen Reactions   Oxycodone Nausea And Vomiting   Statins Other (See Comments)    Muscle and Bone pain    Amiodarone     Caused issues and was stopped by MD- possible was the cause for the patient's heart "going in and out of rhythm" multiple times   Donepezil Other (See Comments)    Per [facility] Insomnia   Imipramine Other (See Comments)    Per [facility]   Oxybutynin Chloride Swelling and Other (See Comments)    Per [facility]    CURRENT MEDICATIONS:    Current Facility-Administered Medications  Medication Dose Route Frequency Provider Last Rate Last Admin   0.9 %  sodium chloride infusion   Intravenous Continuous Lavina Hamman, MD       acetaminophen (TYLENOL) tablet 650 mg  650 mg Oral Q6H PRN Lavina Hamman, MD       Or   acetaminophen (TYLENOL) suppository 650 mg  650 mg Rectal Q6H PRN Lavina Hamman, MD       albuterol (PROVENTIL) (2.5 MG/3ML) 0.083% nebulizer solution 2.5 mg  2.5 mg Inhalation Q6H PRN Lavina Hamman, MD       arformoterol (BROVANA) nebulizer solution 15 mcg  15 mcg Nebulization BID Lavina Hamman, MD       And   umeclidinium bromide (INCRUSE ELLIPTA) 62.5 MCG/ACT 1 puff  1 puff Inhalation Daily Lavina Hamman, MD       [START ON 09/11/2022] atorvastatin (LIPITOR) tablet 40 mg  40 mg Oral q1800 Lavina Hamman, MD       [START ON 09/11/2022] clopidogrel (PLAVIX) tablet 75 mg  75 mg Oral Daily Lavina Hamman, MD       ezetimibe (ZETIA) tablet 10 mg  10 mg Oral QPM Lavina Hamman, MD       [START ON 09/11/2022] ferrous sulfate tablet 325 mg  325 mg  Oral Q breakfast Lavina Hamman, MD       heparin ADULT infusion 100 units/mL (25000 units/225mL)  1,450 Units/hr Intravenous Continuous Pham, Minh Q, RPH-CPP       hydrALAZINE (APRESOLINE) injection 10 mg  10 mg Intravenous Q4H PRN Lavina Hamman, MD       HYDROcodone-acetaminophen (NORCO/VICODIN) 5-325 MG per tablet 1  tablet  1 tablet Oral Q4H PRN Lavina Hamman, MD       memantine [facility]) tablet 20 mg  20 mg Oral QHS Lavina Hamman, MD       metoprolol tartrate (LOPRESSOR) tablet 50 mg  50 mg Oral BID Lavina Hamman, MD       morphine (PF) 2 MG/ML injection 2 mg  2 mg Intravenous Q2H PRN Lavina Hamman, MD       ondansetron [facility]) tablet 4 mg  4 mg Oral Q6H PRN Lavina Hamman, MD       Or   ondansetron [facility]) injection 4 mg  4 mg Intravenous Q6H PRN Lavina Hamman, MD       Derrill Memo ON 09/11/2022] pantoprazole (PROTONIX) EC tablet 40 mg  40 mg Oral QAC breakfast Lavina Hamman, MD       rivastigmine (EXELON) capsule 6 mg  6 mg Oral BID Lavina Hamman, MD        REVIEW OF SYSTEMS:   [X]  denotes positive finding, [ ]  denotes negative finding Cardiac  Comments:  Chest pain or chest pressure:    Shortness of breath upon exertion:    Short of breath when lying flat:    Irregular heart rhythm:        Vascular    Pain in calf, thigh, or hip brought on by ambulation: x   Pain in feet at night that wakes you up from your sleep:  x   Blood clot in your veins:    Leg swelling:         Pulmonary    Oxygen at home:    Productive cough:     Wheezing:         Neurologic    Sudden weakness in arms or legs:     Sudden numbness in arms or legs:     Sudden onset of difficulty speaking or slurred speech:    Temporary loss of vision in one eye:     Problems with dizziness:         Gastrointestinal    Blood in stool:      Vomited blood:         Genitourinary    Burning when urinating:     Blood in urine:        Psychiatric    Major depression:         Hematologic     Bleeding problems:    Problems with blood clotting too easily:        Skin    Rashes or ulcers:        Constitutional    Fever or chills:     PHYSICAL EXAM:   Vitals:   09/10/22 1730  BP: (!) 169/74  Pulse: 64  Resp: 16  Temp: 98.2 F (36.8 C)  TempSrc: Oral  SpO2: 97%  Weight: 80.6 kg  Height: 5\' 9"  (1.753 m)    GENERAL: The patient is a well-nourished male, in no acute distress. The vital signs are documented above. CARDIAC: There is a regular rate and rhythm.  VASCULAR: Monophasic posterior tibial and dorsalis pedis Doppler signals in the right foot with palpable femoral pulses PULMONARY: Nonlabored respirations ABDOMEN: Soft and non-tender with normal pitched bowel sounds.  MUSCULOSKELETAL: There are no major deformities or cyanosis. NEUROLOGIC: Normal motor function of the right foot. SKIN: There are no ulcers or rashes noted. PSYCHIATRIC: The patient has a normal affect.  STUDIES:   He had  a CT angiogram in [place] that showed occlusion of his bypass graft.  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  This has happened multiple times in the past.  Fortunately, he does have motor function in the right foot and so he does not need urgent treatment however we did discuss proceeding with angiography tomorrow with initiation of thrombolytic therapy.  He knows that he may require surgical revascularization and that this could lead to limb loss.  All questions were answered.  He will be n.p.o. after midnight.   Leia Alf, MD, FACS [facility] [phone] Pager [phone]

## 2022-09-10 NOTE — H&P (Signed)
[facility] History and Physical   Patient: Donald Gordon [id number] DOB: 1941/01/13 DOA: 09/10/2022 PCP: [facility]  DOS: the patient was seen and examined on 09/10/2022 Patient coming from: Outside Hospital  Chief Complaint: Right foot pain  HPI: Donald Gordon is a 82 y.o. male with Past medical history of PVD right femoropopliteal bypass, CAD, HTN, HLD, GERD, PAF, diet managed type II DM, COPD presented to hospital with complaints of pain on his right leg. Onset was 1 day prior to admission. Along with the pain patient also reports loss of sensation of the right toe. No fall no trauma reported. Patient reports he is compliant with all his medications. Reports recently he received a few vaccines as well as shot in his eye. 10 days ago he had a GI illness which is currently resolved. No abdominal pain no nausea no vomiting no fever no chills no chest pain.  No shortness of breath.  No cough.  No bleeding anywhere reported by the patient. Patient presented to [facility] ED with this complaint. Underwent evaluation in the ER. Per report from [facility] abdomen aorta bilateral lower extremity runoff with IV contrast was performed. Femoropopliteal bypass graft is now occluded in its entirety which appears to be new. Collateralization with patency of anterior tibial posterior tibial and peroneal arteries Vascular surgery was consulted at [facility] and recommendation was for transfer for formal consultation and heparin. Hemoglobin 12.7.  WBC 9.28.  Platelet count is 291.  Additional findings showed BPH which bladder herniation.  Review of Systems: As mentioned in the history of present illness. All other systems reviewed and are negative.  Past Medical History:  Diagnosis Date   Arthritis    "knees, elbows" (03/18/2018)   Chronic edema    a. Chronic RLE edema.   COPD (chronic obstructive pulmonary disease) (HCC)    Coronary artery disease    a. MI s/p balloon  1996, details unclear.   Dysrhythmia    PROXIMAL MARGIN FIBRILATION   GERD (gastroesophageal reflux disease)    Hyperlipidemia    Hypertension    Myocardial infarction ([place]) 1996   PAD (peripheral artery disease) ([place])    a. s/p stenting 08/2012, 02/2013.    PAF (paroxysmal atrial fibrillation) ([place])    Sleep apnea    "have mask; have to start wearing it" (03/18/2018)   Stroke [facility]) ~ 2014   denies residual on 03/18/2018   Thrombosis of lower extremity    a. Listed on patient's medical bracelet - at [place].   Type II diabetes mellitus ([place])    Past Surgical History:  Procedure Laterality Date   ABDOMINAL AORTOGRAM W/LOWER EXTREMITY N/A 07/25/2019   Procedure: ABDOMINAL AORTOGRAM W/LOWER EXTREMITY;  Surgeon: Waynetta Sandy, MD;  Location: [place] [facility];  Service: Cardiovascular;  Laterality: N/A;   ANGIOPLASTY Right 06/03/2020   Procedure: BALLOON ANGIOPLASTY POPITEAL ARTERY;  Surgeon: Cherre Robins, MD;  Location: [facility];  Service: Vascular;  Laterality: Right;   ANTERIOR LUMBAR FUSION  1981   APPENDECTOMY     APPLICATION OF WOUND VAC Right 01/09/2018   Procedure: APPLICATION OF WOUND VAC ON RIGHT LOWER LEG;  Surgeon: Conrad Allardt, MD;  Location: [place];  Service: Vascular;  Laterality: Right;   ATRIAL FIBRILLATION ABLATION N/A 12/07/2020   Procedure: ATRIAL FIBRILLATION ABLATION;  Surgeon: Constance Haw, MD;  Location: [place] [facility];  Service: Cardiovascular;  Laterality: N/A;   BACK SURGERY     BUBBLE STUDY  12/07/2020   Procedure: BUBBLE STUDY;  Surgeon: [name], MD;  Location: [place];  Service: Cardiovascular;;   CATARACT EXTRACTION W/ INTRAOCULAR LENS  IMPLANT, BILATERAL Bilateral    CORONARY ANGIOPLASTY WITH STENT PLACEMENT     "I've got 1 stent in there" (03/18/2018)   FASCIOTOMY Right 01/09/2018   Procedure: FASCIOTOMY RIGHT LOWER LEG;  Surgeon: Conrad [place], MD;  Location: [place];  Service: Vascular;  Laterality: Right;   FASCIOTOMY Right  06/04/2020   Procedure: RIGHT LOWER EXTREMITY FASCIOTOMY;  Surgeon: Elam Dutch, MD;  Location: [place];  Service: Vascular;  Laterality: Right;   FASCIOTOMY CLOSURE Right 01/11/2018   Procedure: FASCIOTOMY CLOSURE RIGHT CALF;  Surgeon: Conrad [place], MD;  Location: [place];  Service: Vascular;  Laterality: Right;   FEMORAL-POPLITEAL BYPASS GRAFT Right 01/09/2018   Procedure: RIGHT FEMORAL-POPLITEAL ARTERY BYPASS USING PROPATEN 6MM X 80CM VASCULAR GRAFT;  Surgeon: Conrad [place], MD;  Location: [place];  Service: Vascular;  Laterality: Right;   HEMORRHOID SURGERY     LEFT HEART CATH AND CORONARY ANGIOGRAPHY N/A 03/19/2018   Procedure: LEFT HEART CATH AND CORONARY ANGIOGRAPHY;  Surgeon: Martin[place], Peter M, MD;  Location: [place] [facility];  Service: Cardiovascular;  Laterality: N/A;   LOWER EXTREMITY ANGIOGRAM Right 06/02/2020   Procedure: RIGHT LOWER EXTREMITY ANGIOGRAM INTERVENTION;  Surgeon: Cherre Robins, MD;  Location: [place];  Service: Vascular;  Laterality: Right;   LOWER EXTREMITY ANGIOGRAM Right 06/03/2020   Procedure: RIGHT LOWER EXTREMITY ANGIOGRAM from [place];  Surgeon: Cherre Robins, MD;  Location: [place];  Service: Vascular;  Laterality: Right;   LOWER EXTREMITY ANGIOGRAPHY N/A 08/31/2019   Procedure: LOWER EXTREMITY ANGIOGRAPHY;  Surgeon: [name], MD;  Location: [place] [facility];  Service: Cardiovascular;  Laterality: N/A;   LOWER EXTREMITY ANGIOGRAPHY Right 01/18/2020   Procedure: LOWER EXTREMITY ANGIOGRAPHY;  Surgeon: [name], MD;  Location: [place] [facility];  Service: Cardiovascular;  Laterality: Right;   PATCH ANGIOPLASTY Right 01/09/2018   Procedure: PATCH ANGIOPLASTY USING Rueben Bash BIOLOGIC 1CM X 6CM PATCH;  Surgeon: Conrad [place], MD;  Location: [place];  Service: Vascular;  Laterality: Right;   PERIPHERAL VASCULAR ATHERECTOMY Right 01/19/2020   Procedure: PERIPHERAL VASCULAR ATHERECTOMY;  Surgeon: [name], MD;  Location: [place] [facility];  Service: Cardiovascular;  Laterality: Right;  Femoral popliteal and tibioperoneal arteries.   PERIPHERAL VASCULAR BALLOON ANGIOPLASTY Right 09/01/2019   Procedure: PERIPHERAL VASCULAR BALLOON ANGIOPLASTY;  Surgeon: Waynetta Sandy, MD;  Location: [place] [facility];  Service: Cardiovascular;  Laterality: Right;  peroneal artery.   PERIPHERAL VASCULAR INTERVENTION Right 07/25/2019   Procedure: PERIPHERAL VASCULAR INTERVENTION;  Surgeon: Waynetta Sandy, MD;  Location: [place] [facility];  Service: Cardiovascular;  Laterality: Right;  SFA X 3   PERIPHERAL VASCULAR INTERVENTION Right 09/01/2019   Procedure: PERIPHERAL VASCULAR INTERVENTION;  Surgeon: Waynetta Sandy, MD;  Location: [place] [facility];  Service: Cardiovascular;  Laterality: Right;  superficial femoral   PERIPHERAL VASCULAR THROMBECTOMY Right 09/01/2019   Procedure: PERIPHERAL VASCULAR THROMBECTOMY;  Surgeon: Waynetta Sandy, MD;  Location: [place] [facility];  Service: Cardiovascular;  Laterality: Right;   PERIPHERAL VASCULAR THROMBECTOMY Right 01/18/2020   Procedure: PERIPHERAL VASCULAR THROMBECTOMY;  Surgeon: [name], MD;  Location: [place] [facility];  Service: Cardiovascular;  Laterality: Right;   PERIPHERAL VASCULAR THROMBECTOMY N/A 01/19/2020   Procedure: LYSIS RECHECK;  Surgeon: [name], MD;  Location:  [place] [facility];  Service: Cardiovascular;  Laterality: N/A;   POSTERIOR LUMBAR FUSION  1978   TEE WITHOUT CARDIOVERSION N/A 12/07/2020   Procedure: TRANSESOPHAGEAL ECHOCARDIOGRAM (TEE);  Surgeon: [name], MD;  Location: [place];  Service: Cardiovascular;  Laterality: N/A;   THROMBECTOMY FEMORAL ARTERY Right 01/09/2018   Procedure: THROMBECTOMY RIGHT LOWER LEG;  Surgeon: Conrad [place], MD;  Location: [place];  Service: Vascular;  Laterality: Right;   TONSILLECTOMY     Social History:  reports that he quit smoking about 28 years ago. He has  a 90.00 pack-year smoking history. He has never used smokeless tobacco. He reports that he does not drink alcohol and does not use drugs.  Allergies  Allergen Reactions   Oxycodone Nausea And Vomiting   Statins Other (See Comments)    Muscle and Bone pain    Amiodarone     Caused issues and was stopped by MD- possible was the cause for the patient's heart "going in and out of rhythm" multiple times   Donepezil Other (See Comments)    Per [facility] Insomnia   Imipramine Other (See Comments)    Per [facility]   Oxybutynin Chloride Swelling and Other (See Comments)    Per [facility]   Family History  Problem Relation Age of Onset   CAD Mother    CAD Brother    Hypertension Other     Prior to Admission medications   Medication Sig Start Date End Date Taking? Authorizing Provider  acetaminophen (TYLENOL) 500 MG tablet Take 500 mg by mouth 4 (four) times daily as needed for moderate pain or headache.    [provider]  albuterol (VENTOLIN HFA) 108 (90 Base) MCG/ACT inhaler Inhale into the lungs. 12/05/20   [provider]  apixaban (ELIQUIS) 2.5 MG TABS tablet TAKE 1 TABLET (2.5 MG TOTAL) BY MOUTH TWO TIMES DAILY. 05/21/20 05/21/21  Dwyane Dee, MD  atorvastatin (LIPITOR) 40 MG tablet Take 1 tablet (40 mg total) by mouth daily at 6 PM. 06/12/20   Dagoberto Ligas, PA-C  Cholecalciferol (VITAMIN D) 50 MCG (2000 UT) tablet Take 2,000 Units by mouth daily.    [provider]  clopidogrel (PLAVIX) 75 MG tablet Take 1 tablet by mouth daily. 12/17/20   [provider]  ezetimibe (ZETIA) 10 MG tablet Take 10 mg by mouth every evening.     [provider]  ferrous sulfate 325 (65 FE) MG tablet Take 325 mg by mouth daily with breakfast.    [provider]  hydrochlorothiazide (MICROZIDE) 12.5 MG capsule Take 12.5 mg by mouth daily.    [provider]  memantine (NAMENDA) 10 MG tablet Take 20 mg by mouth at bedtime.    [provider]   metoprolol tartrate (LOPRESSOR) 50 MG tablet Take 50 mg by mouth 2 (two) times daily.    [provider]  nitroGLYCERIN (NITROSTAT) 0.4 MG SL tablet Place 0.4 mg under the tongue every 5 (five) minutes x 3 doses as needed for chest pain.    [provider]  pantoprazole (PROTONIX) 40 MG tablet Take 40 mg by mouth daily before breakfast.    [provider]  ramipril (ALTACE) 10 MG capsule Take 10 mg by mouth 2 (two) times daily.    [provider]  rivastigmine (EXELON) 6 MG capsule Take 6 mg by mouth 2 (two) times daily.    [provider]  Tiotropium Bromide-Olodaterol 2.5-2.5 MCG/ACT AERS Take by mouth. 12/05/20   [provider]  vitamin B-12 (CYANOCOBALAMIN) 500 MCG tablet Take 500 mcg by mouth daily.    [provider]  amiodarone (PACERONE) 200 MG tablet Take 1 tablet (200 mg total) by mouth daily. Patient not taking: Reported on 06/02/2020 05/22/20 06/02/20  Dwyane Dee, MD    Physical Exam: Vitals:   09/10/22 1730  BP: (!) 169/74  Pulse: 64  Resp: 16  Temp: 98.2 F (36.8 C)  TempSrc: Oral  SpO2: 97%  Weight: 80.6 kg  Height: 5\' 9"  (1.753 m)    General: alert and oriented to time, place, and person. Appear in mild distress, affect appropriate Eyes: PERRL, Conjunctiva normal ENT: Oral Mucosa Clear, moist  Neck: no JVD, no Abnormal Mass Or lumps Cardiovascular: S1 and S2 Present, aortic systolic Murmur, pedal pulses decreased or absent right Respiratory: good respiratory effort, Bilateral Air entry equal and Decreased, no signs of accessory muscle use, Clear to Auscultation, no Crackles, no wheezes Abdomen: Bowel Sound present, Soft and no tenderness, no hernia Skin: no rashes  Extremities: no Pedal edema, no calf tenderness Right great toe cool to touch, no discoloration Neurologic: PERLA and Motor strength 5/5 and symmetric decree sensation in the right toe  Data Reviewed: I have personally reviewed and  interpreted labs, imaging as discussed below. Report for CBC and BMP reviewed from the outside facility. New labs ordered today. Radiological Exams on Admission: Report reviewed from the outside facility.  I reviewed all nursing notes, pharmacy notes, vitals, pertinent old records.  Assessment/Plan 1. 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 Started at the outside facility. Vascular surgery consulted. I discussed with Dr. Trula Slade. Most likely angiogram for tomorrow. Will provide IV hydration as the patient has history of CKD 3A and has received IV contrast and will receive further contrast. Currently will order Plavix for tomorrow.  Defer to vascular surgery for continuation.  2.  History of PVD as well as CAD. HLD. Check lipid profile Continue statin and Zetia.  3.  HTN. Blood pressure mildly elevated. Use as needed hydralazine. Resume home regimen.  Type 2 diabetes mellitus. Stage III CKD without long-term insulin use. Appears to be diet controlled only. Will check hemoglobin A1c. Sliding scale insulin.  COPD. No evidence of exacerbation Resume home inhalers.  CKD 3A. Monitor for renal function worsening while receiving contrast for studies.  Paroxysmal A-fib. On Eliquis prior to admission. Was on amiodarone in the past currently not on it. Resuming Lopressor. Currently on IV heparin.  Chronic diastolic CHF. Volume status appears to be adequate. Monitor.  GERD. Will continue PPI.  Nutrition: N.p.o. for now until able by vascular surgery. DVT Prophylaxis: Therapeutic Anticoagulation with heparin  Advance goals of care discussion:   Code Status: Full Code   Consults: I personally Discussed with vascular surgery  Family Communication: No family was present at bedside, at the time of interview.   Author: Berle Mull, MD [facility] 09/10/2022 6:20 PM   To reach On-call, see care teams to locate the attending  and reach out to them via [email or web address]. If 7PM-7AM, please contact night-coverage If you still have difficulty reaching the attending provider, please page the [facility] (Director on Call) for [facility] on amion for assistance.

## 2022-09-10 NOTE — Progress Notes (Signed)
ANTICOAGULATION CONSULT NOTE - Initial Consult  Pharmacy Consult for heparin  Indication:  PVD  Allergies  Allergen Reactions   Oxycodone Nausea And Vomiting   Statins Other (See Comments)    Muscle and Bone pain    Amiodarone     Caused issues and was stopped by MD- possible was the cause for the patient's heart "going in and out of rhythm" multiple times   Donepezil Other (See Comments)    Per [facility] Insomnia   Imipramine Other (See Comments)    Per [facility]   Oxybutynin Chloride Swelling and Other (See Comments)    Per [facility]    Patient Measurements: Height: 5\' 9"  (175.3 cm) Weight: 80.6 kg (177 lb 11.1 oz) IBW/kg (Calculated) : 70.7 Heparin Dosing Weight: 81kg  Vital Signs: Temp: 98.2 F (36.8 C) (03/20 1730) Temp Source: Oral (03/20 1730) BP: 169/74 (03/20 1730) Pulse Rate: 64 (03/20 1730)  Labs: No results for input(s): "HGB", "HCT", "PLT", "APTT", "LABPROT", "INR", "HEPARINUNFRC", "HEPRLOWMOCWT", "CREATININE", "CKTOTAL", "CKMB", "TROPONINIHS" in the last 72 hours.  CrCl cannot be calculated (Patient's most recent lab result is older than the maximum 21 days allowed.).   Medical History: Past Medical History:  Diagnosis Date   Arthritis    "knees, elbows" (03/18/2018)   Chronic edema    a. Chronic RLE edema.   COPD (chronic obstructive pulmonary disease) (HCC)    Coronary artery disease    a. MI s/p balloon 1996, details unclear.   Dysrhythmia    PROXIMAL MARGIN FIBRILATION   GERD (gastroesophageal reflux disease)    Hyperlipidemia    Hypertension    Myocardial infarction ([place]) 1996   PAD (peripheral artery disease) ([place])    a. s/p stenting 08/2012, 02/2013.    PAF (paroxysmal atrial fibrillation) ([place])    Sleep apnea    "have mask; have to start wearing it" (03/18/2018)   Stroke [facility]) ~ 2014   denies residual on 03/18/2018   Thrombosis of lower extremity    a. Listed on patient's medical bracelet - at [place].   Type II diabetes mellitus (HCC)      Medications:  Medications Prior to Admission  Medication Sig Dispense Refill Last Dose   acetaminophen (TYLENOL) 500 MG tablet Take 500 mg by mouth 4 (four) times daily as needed for moderate pain or headache.      albuterol (VENTOLIN HFA) 108 (90 Base) MCG/ACT inhaler Inhale into the lungs.      apixaban (ELIQUIS) 2.5 MG TABS tablet TAKE 1 TABLET (2.5 MG TOTAL) BY MOUTH TWO TIMES DAILY. 60 tablet 3    atorvastatin (LIPITOR) 40 MG tablet Take 1 tablet (40 mg total) by mouth daily at 6 PM. 30 tablet 1    Cholecalciferol (VITAMIN D) 50 MCG (2000 UT) tablet Take 2,000 Units by mouth daily.      clopidogrel (PLAVIX) 75 MG tablet Take 1 tablet by mouth daily.      ezetimibe (ZETIA) 10 MG tablet Take 10 mg by mouth every evening.       ferrous sulfate 325 (65 FE) MG tablet Take 325 mg by mouth daily with breakfast.      hydrochlorothiazide (MICROZIDE) 12.5 MG capsule Take 12.5 mg by mouth daily.      memantine (NAMENDA) 10 MG tablet Take 20 mg by mouth at bedtime.      metoprolol tartrate (LOPRESSOR) 50 MG tablet Take 50 mg by mouth 2 (two) times daily.      nitroGLYCERIN (NITROSTAT) 0.4 MG SL tablet Place 0.4  mg under the tongue every 5 (five) minutes x 3 doses as needed for chest pain.      pantoprazole (PROTONIX) 40 MG tablet Take 40 mg by mouth daily before breakfast.      ramipril (ALTACE) 10 MG capsule Take 10 mg by mouth 2 (two) times daily.      rivastigmine (EXELON) 6 MG capsule Take 6 mg by mouth 2 (two) times daily.      Tiotropium Bromide-Olodaterol 2.5-2.5 MCG/ACT AERS Take by mouth.      vitamin B-12 (CYANOCOBALAMIN) 500 MCG tablet Take 500 mcg by mouth daily.      Scheduled:   arformoterol  15 mcg Nebulization BID   And   umeclidinium bromide  1 puff Inhalation Daily   [START ON 09/11/2022] atorvastatin  40 mg Oral q1800   [START ON 09/11/2022] clopidogrel  75 mg Oral Daily   ezetimibe  10 mg Oral QPM   [START ON 09/11/2022] ferrous sulfate  325 mg Oral Q breakfast    memantine  20 mg Oral QHS   metoprolol tartrate  50 mg Oral BID   [START ON 09/11/2022] pantoprazole  40 mg Oral QAC breakfast   rivastigmine  6 mg Oral BID   Infusions:   sodium chloride     heparin      Assessment: Pt was transferred from [facility] for severe occlusion of fem-pop. He was on apixaban prior to admission. Last dose was this AM [facility]. Heparin was started a little early around 2pm.   PTT 32 Hgb 12.7 Plt wnl Scr 1.5  Goal of Therapy:  Heparin level 0.3-0.7 units/ml aPTT 66-102 Monitor platelets by anticoagulation protocol: Yes   Plan:  Heparin bolus 6400 units x1 Heparin infusion 1450 units/hr - [facility] Check 8 hr PTT Daily PTT/HL  Onnie Boer, PharmD, BCIDP, AAHIVP, CPP Infectious Disease Pharmacist 09/10/2022 6:21 PM

## 2022-09-11 ENCOUNTER — Encounter (HOSPITAL_COMMUNITY): Payer: Self-pay | Admitting: Internal Medicine

## 2022-09-11 ENCOUNTER — Encounter (HOSPITAL_COMMUNITY)
Disposition: A | Discharge status: Home / Self Care | Payer: Self-pay | Source: Other Acute Inpatient Hospital | Attending: Vascular Surgery

## 2022-09-11 ENCOUNTER — Other Ambulatory Visit: Payer: Self-pay

## 2022-09-11 DIAGNOSIS — T82868A Thrombosis of vascular prosthetic devices, implants and grafts, initial encounter: Secondary | ICD-10-CM | POA: Diagnosis not present

## 2022-09-11 HISTORY — PX: LOWER EXTREMITY ANGIOGRAPHY: CATH118251

## 2022-09-11 LAB — COMPREHENSIVE METABOLIC PANEL
ALT: 21 U/L (ref 0–44)
AST: 15 U/L (ref 15–41)
Albumin: 3.2 g/dL — ABNORMAL LOW (ref 3.5–5.0)
Alkaline Phosphatase: 66 U/L (ref 38–126)
Anion gap: 8 (ref 5–15)
BUN: 19 mg/dL (ref 8–23)
CO2: 22 mmol/L (ref 22–32)
Calcium: 8.2 mg/dL — ABNORMAL LOW (ref 8.9–10.3)
Chloride: 107 mmol/L (ref 98–111)
Creatinine, Ser: 1.47 mg/dL — ABNORMAL HIGH (ref 0.61–1.24)
GFR, Estimated: 47 mL/min — ABNORMAL LOW (ref >60)
Glucose, Bld: 114 mg/dL — ABNORMAL HIGH (ref 70–99)
Potassium: 3.4 mmol/L — ABNORMAL LOW (ref 3.5–5.1)
Sodium: 137 mmol/L (ref 135–145)
Total Bilirubin: 0.6 mg/dL (ref 0.3–1.2)
Total Protein: 5.8 g/dL — ABNORMAL LOW (ref 6.5–8.1)

## 2022-09-11 LAB — HEMOGLOBIN A1C
Hgb A1c MFr Bld: 6.4 % — ABNORMAL HIGH (ref 4.8–5.6)
Mean Plasma Glucose: 137 mg/dL

## 2022-09-11 LAB — CBC
HCT: 36 % — ABNORMAL LOW (ref 39.0–52.0)
HCT: 35 % — ABNORMAL LOW (ref 39.0–52.0)
HCT: 33.1 % — ABNORMAL LOW (ref 39.0–52.0)
Hemoglobin: 12.2 g/dL — ABNORMAL LOW (ref 13.0–17.0)
Hemoglobin: 11.7 g/dL — ABNORMAL LOW (ref 13.0–17.0)
Hemoglobin: 11 g/dL — ABNORMAL LOW (ref 13.0–17.0)
MCH: 32.4 pg (ref 26.0–34.0)
MCH: 32.1 pg (ref 26.0–34.0)
MCH: 32.4 pg (ref 26.0–34.0)
MCHC: 33.9 g/dL (ref 30.0–36.0)
MCHC: 33.4 g/dL (ref 30.0–36.0)
MCHC: 33.2 g/dL (ref 30.0–36.0)
MCV: 95.7 fL (ref 80.0–100.0)
MCV: 95.9 fL (ref 80.0–100.0)
MCV: 97.4 fL (ref 80.0–100.0)
Platelets: 245 10*3/uL (ref 150–400)
Platelets: 164 10*3/uL (ref 150–400)
Platelets: 180 10*3/uL (ref 150–400)
RBC: 3.76 MIL/uL — ABNORMAL LOW (ref 4.22–5.81)
RBC: 3.65 MIL/uL — ABNORMAL LOW (ref 4.22–5.81)
RBC: 3.4 MIL/uL — ABNORMAL LOW (ref 4.22–5.81)
RDW: 12.6 % (ref 11.5–15.5)
RDW: 12.7 % (ref 11.5–15.5)
RDW: 12.9 % (ref 11.5–15.5)
WBC: 8.9 10*3/uL (ref 4.0–10.5)
WBC: 7.4 10*3/uL (ref 4.0–10.5)
WBC: 7.4 10*3/uL (ref 4.0–10.5)
nRBC: 0 % (ref 0.0–0.2)
nRBC: 0 % (ref 0.0–0.2)
nRBC: 0 % (ref 0.0–0.2)

## 2022-09-11 LAB — BASIC METABOLIC PANEL
Anion gap: 12 (ref 5–15)
BUN: 15 mg/dL (ref 8–23)
CO2: 21 mmol/L — ABNORMAL LOW (ref 22–32)
Calcium: 7.9 mg/dL — ABNORMAL LOW (ref 8.9–10.3)
Chloride: 106 mmol/L (ref 98–111)
Creatinine, Ser: 1.3 mg/dL — ABNORMAL HIGH (ref 0.61–1.24)
GFR, Estimated: 55 mL/min — ABNORMAL LOW (ref >60)
Glucose, Bld: 132 mg/dL — ABNORMAL HIGH (ref 70–99)
Potassium: 3.1 mmol/L — ABNORMAL LOW (ref 3.5–5.1)
Sodium: 139 mmol/L (ref 135–145)

## 2022-09-11 LAB — LIPID PANEL
Cholesterol: 128 mg/dL (ref 0–200)
HDL: 31 mg/dL — ABNORMAL LOW (ref >40)
LDL Cholesterol: 65 mg/dL (ref 0–99)
Total CHOL/HDL Ratio: 4.1 RATIO
Triglycerides: 159 mg/dL — ABNORMAL HIGH (ref <150)
VLDL: 32 mg/dL (ref 0–40)

## 2022-09-11 LAB — APTT
aPTT: 147 seconds — ABNORMAL HIGH (ref 24–36)
aPTT: 90 seconds — ABNORMAL HIGH (ref 24–36)
aPTT: 67 seconds — ABNORMAL HIGH (ref 24–36)

## 2022-09-11 LAB — MRSA NEXT GEN BY PCR, NASAL: MRSA by PCR Next Gen: NOT DETECTED

## 2022-09-11 LAB — FIBRINOGEN
Fibrinogen: 399 mg/dL (ref 210–475)
Fibrinogen: 339 mg/dL (ref 210–475)

## 2022-09-11 LAB — HEPARIN LEVEL (UNFRACTIONATED)
Heparin Unfractionated: >1.1 IU/mL — ABNORMAL HIGH (ref 0.30–0.70)
Heparin Unfractionated: >1.1 IU/mL — ABNORMAL HIGH (ref 0.30–0.70)

## 2022-09-11 SURGERY — LOWER EXTREMITY ANGIOGRAPHY
Anesthesia: LOCAL

## 2022-09-11 MED ORDER — MIDAZOLAM HCL 2 MG/2ML IJ SOLN
INTRAMUSCULAR | Status: DC | PRN
  Administered 2022-09-11: 1 mg via INTRAVENOUS

## 2022-09-11 MED ORDER — POTASSIUM CHLORIDE CRYS ER 20 MEQ PO TBCR
40.0000 meq | EXTENDED_RELEASE_TABLET | Freq: Two times a day (BID) | ORAL | Status: AC
  Administered 2022-09-11 – 2022-09-12 (×2): 40 meq via ORAL
  Filled 2022-09-11 (×2): qty 2

## 2022-09-11 MED ORDER — SODIUM CHLORIDE 0.9 % IV SOLN: 250.0000 mL | INTRAVENOUS | Status: DC | PRN

## 2022-09-11 MED ORDER — SODIUM CHLORIDE 0.9 % IV SOLN
1.0000 mg/h | INTRAVENOUS | Status: DC
  Administered 2022-09-11 – 2022-09-12 (×3): 1 mg/h
  Filled 2022-09-11 (×6): qty 10

## 2022-09-11 MED ORDER — SODIUM CHLORIDE 0.9% FLUSH: 3.0000 mL | INTRAVENOUS | Status: DC | PRN

## 2022-09-11 MED ORDER — HEPARIN (PORCINE) IN NACL 1000-0.9 UT/500ML-% IV SOLN
INTRAVENOUS | Status: DC | PRN
  Administered 2022-09-11 (×2): 500 mL

## 2022-09-11 MED ORDER — POTASSIUM CHLORIDE CRYS ER 20 MEQ PO TBCR: 40.0000 meq | EXTENDED_RELEASE_TABLET | Freq: Once | ORAL | Status: DC

## 2022-09-11 MED ORDER — CHLORHEXIDINE GLUCONATE CLOTH 2 % EX PADS
6.0000 | MEDICATED_PAD | Freq: Every day | CUTANEOUS | Status: DC
  Administered 2022-09-11 – 2022-09-13 (×3): 6 via TOPICAL

## 2022-09-11 MED ORDER — MORPHINE SULFATE (PF) 2 MG/ML IV SOLN: 5.0000 mg | INTRAVENOUS | Status: DC | PRN

## 2022-09-11 MED ORDER — FENTANYL CITRATE (PF) 100 MCG/2ML IJ SOLN
INTRAMUSCULAR | Status: DC | PRN
  Administered 2022-09-11: 50 ug via INTRAVENOUS

## 2022-09-11 MED ORDER — ONDANSETRON HCL 4 MG/2ML IJ SOLN: 4.0000 mg | Freq: Four times a day (QID) | INTRAMUSCULAR | Status: DC | PRN

## 2022-09-11 MED ORDER — MIDAZOLAM HCL 2 MG/2ML IJ SOLN: 1.0000 mg | INTRAMUSCULAR | Status: DC | PRN

## 2022-09-11 MED ORDER — MIDAZOLAM HCL 2 MG/2ML IJ SOLN
INTRAMUSCULAR | Status: AC
  Filled 2022-09-11: qty 2

## 2022-09-11 MED ORDER — LIDOCAINE HCL (PF) 1 % IJ SOLN
INTRAMUSCULAR | Status: DC | PRN
  Administered 2022-09-11: 15 mL via INTRADERMAL

## 2022-09-11 MED ORDER — LIDOCAINE HCL (PF) 1 % IJ SOLN
INTRAMUSCULAR | Status: AC
  Filled 2022-09-11: qty 30

## 2022-09-11 MED ORDER — FENTANYL CITRATE (PF) 100 MCG/2ML IJ SOLN
INTRAMUSCULAR | Status: AC
  Filled 2022-09-11: qty 2

## 2022-09-11 MED ORDER — HEPARIN (PORCINE) 25000 UT/250ML-% IV SOLN
800.0000 [IU]/h | INTRAVENOUS | Status: DC
  Administered 2022-09-11: 800 [IU]/h via INTRAVENOUS
  Filled 2022-09-11 (×2): qty 250

## 2022-09-11 MED ORDER — SODIUM CHLORIDE 0.9% FLUSH
3.0000 mL | Freq: Two times a day (BID) | INTRAVENOUS | Status: DC
  Administered 2022-09-11 – 2022-09-13 (×5): 3 mL via INTRAVENOUS

## 2022-09-11 MED ORDER — IODIXANOL 320 MG/ML IV SOLN
INTRAVENOUS | Status: DC | PRN
  Administered 2022-09-11: 70 mL via INTRA_ARTERIAL

## 2022-09-11 SURGICAL SUPPLY — 13 items
CATH INFUS 135CMX50CM (CATHETERS) IMPLANT
CATH OMNI FLUSH 5F 65CM (CATHETERS) IMPLANT
CATH QUICKCROSS .035X135CM (MICROCATHETER) IMPLANT
GLIDEWIRE ADV .035X260CM (WIRE) IMPLANT
KIT MICROPUNCTURE NIT STIFF (SHEATH) IMPLANT
KIT PV (KITS) ×1 IMPLANT
SHEATH CATAPULT 6FR 45 (SHEATH) IMPLANT
SHEATH PINNACLE 5F 10CM (SHEATH) IMPLANT
SHEATH PROBE COVER 6X72 (BAG) IMPLANT
SYR MEDRAD MARK 7 150ML (SYRINGE) ×1 IMPLANT
TRANSDUCER W/STOPCOCK (MISCELLANEOUS) ×1 IMPLANT
TRAY PV CATH (CUSTOM PROCEDURE TRAY) ×1 IMPLANT
WIRE STARTER BENTSON 035X150 (WIRE) IMPLANT

## 2022-09-11 NOTE — Progress Notes (Signed)
[place] for Heparin  Indication:  PVD  Allergies  Allergen Reactions   Oxycodone Nausea And Vomiting   Statins Other (See Comments)    Muscle and Bone pain    Amiodarone     Caused issues and was stopped by MD- possible was the cause for the patient's heart "going in and out of rhythm" multiple times   Donepezil Other (See Comments)    Per [facility] Insomnia   Imipramine Other (See Comments)    Per [facility]   Oxybutynin Chloride Swelling and Other (See Comments)    Per [facility]    Patient Measurements: Height: 5\' 9"  (175.3 cm) Weight: 80.6 kg (177 lb 11.1 oz) IBW/kg (Calculated) : 70.7 Heparin Dosing Weight: 81kg  Vital Signs: Temp: 98.1 F (36.7 C) (03/20 2316) Temp Source: Oral (03/20 2316) BP: 119/52 (03/20 2316) Pulse Rate: 69 (03/20 2316)  Labs: Recent Labs    09/10/22 1807 09/10/22 2250 09/11/22 0101  HGB 13.2  --  12.2*  HCT 38.0*  --  36.0*  PLT 265  --  245  APTT  --  147*  --   LABPROT 17.8*  --   --   INR 1.5*  --   --   HEPARINUNFRC >1.10*  --   --   CREATININE 1.30*  --  1.47*    Estimated Creatinine Clearance: 38.7 mL/min (A) (by C-G formula based on SCr of 1.47 mg/dL (H)).   Medical History: Past Medical History:  Diagnosis Date   Arthritis    "knees, elbows" (03/18/2018)   Chronic edema    a. Chronic RLE edema.   COPD (chronic obstructive pulmonary disease) (HCC)    Coronary artery disease    a. MI s/p balloon 1996, details unclear.   Dysrhythmia    PROXIMAL MARGIN FIBRILATION   GERD (gastroesophageal reflux disease)    Hyperlipidemia    Hypertension    Myocardial infarction ([place]) 1996   PAD (peripheral artery disease) ([place])    a. s/p stenting 08/2012, 02/2013.    PAF (paroxysmal atrial fibrillation) ([place])    Sleep apnea    "have mask; have to start wearing it" (03/18/2018)   Stroke [facility]) ~ 2014   denies residual on 03/18/2018   Thrombosis of lower extremity    a. Listed on patient's medical bracelet -  at [place].   Type II diabetes mellitus (HCC)     Medications:  Medications Prior to Admission  Medication Sig Dispense Refill Last Dose   acetaminophen (TYLENOL) 500 MG tablet Take 500 mg by mouth 4 (four) times daily as needed for moderate pain or headache.   09/04/2022   albuterol (VENTOLIN HFA) 108 (90 Base) MCG/ACT inhaler Inhale 1 puff into the lungs every 6 (six) hours as needed for shortness of breath.   Past Month   apixaban (ELIQUIS) 2.5 MG TABS tablet TAKE 1 TABLET (2.5 MG TOTAL) BY MOUTH TWO TIMES DAILY. (Patient taking differently: Take 2.5 mg by mouth 2 (two) times daily.) 60 tablet 3 09/10/2022 at 0800   Cholecalciferol (VITAMIN D) 50 MCG (2000 UT) tablet Take 2,000 Units by mouth daily.   09/09/2022   clopidogrel (PLAVIX) 75 MG tablet Take 1 tablet by mouth daily.   09/09/2022   ezetimibe (ZETIA) 10 MG tablet Take 10 mg by mouth every evening.    09/09/2022   ferrous sulfate 325 (65 FE) MG tablet Take 325 mg by mouth daily with breakfast.   09/09/2022   hydrochlorothiazide (MICROZIDE) 12.5 MG  capsule Take 12.5 mg by mouth daily.   09/09/2022   memantine (NAMENDA) 10 MG tablet Take 20 mg by mouth at bedtime.   09/09/2022   metoprolol tartrate (LOPRESSOR) 50 MG tablet Take 50 mg by mouth 2 (two) times daily.   09/09/2022 at 1700   nitroGLYCERIN (NITROSTAT) 0.4 MG SL tablet Place 0.4 mg under the tongue every 5 (five) minutes x 3 doses as needed for chest pain.   unknown   pantoprazole (PROTONIX) 40 MG tablet Take 40 mg by mouth daily before breakfast.   09/09/2022   ramipril (ALTACE) 10 MG capsule Take 10 mg by mouth 2 (two) times daily.   09/09/2022   rivastigmine (EXELON) 6 MG capsule Take 6 mg by mouth 2 (two) times daily.   09/09/2022   vitamin B-12 (CYANOCOBALAMIN) 500 MCG tablet Take 500 mcg by mouth daily.   09/09/2022   atorvastatin (LIPITOR) 40 MG tablet Take 1 tablet (40 mg total) by mouth daily at 6 PM. (Patient not taking: Reported on 09/10/2022) 30 tablet 1 Not Taking   Tiotropium  Bromide-Olodaterol 2.5-2.5 MCG/ACT AERS Take by mouth. (Patient not taking: Reported on 09/10/2022)   Not Taking   Scheduled:   arformoterol  15 mcg Nebulization BID   And   umeclidinium bromide  1 puff Inhalation Daily   atorvastatin  40 mg Oral q1800   clopidogrel  75 mg Oral Daily   ezetimibe  10 mg Oral QPM   ferrous sulfate  325 mg Oral Q breakfast   memantine  20 mg Oral QHS   metoprolol tartrate  50 mg Oral BID   pantoprazole  40 mg Oral QAC breakfast   rivastigmine  6 mg Oral BID   Infusions:   sodium chloride 75 mL/hr at 09/10/22 2057   heparin 1,450 Units/hr (09/10/22 1854)    Assessment: Pt was transferred from [facility] for severe occlusion of fem-pop. He was on apixaban prior to admission. Last dose was this [facility]. Heparin was started a little early around 2pm.    3/21 AM update:  aPTT elevated  Goal of Therapy:  Heparin level 0.3-0.7 units/ml aPTT 66-102 Monitor platelets by anticoagulation protocol: Yes   Plan:  Dec heparin to 1200 units/hr 1000 aPTT and heparin level  Narda Bonds, PharmD, BCPS Clinical Pharmacist Phone: [phone]

## 2022-09-11 NOTE — Progress Notes (Signed)
[facility] [place]  Subjective  - no new complaints   Objective (!) 146/74 60 98.6 F (37 C) (Oral) 18 93%  Intake/Output Summary (Last 24 hours) at 09/11/2022 0714 Last data filed at 09/11/2022 0415 Gross per 24 hour  Intake 200 ml  Output 950 ml  Net -750 ml   Ischemic right LE    Assessment/Planning: PAD with re-occluded right LE bypass.  NPO for angiography today with initiation of thrombolytic therapy. He knows that he may require surgical revascularization and that this could lead to limb loss.   Patient agrees to proceed  Roxy Horseman 09/11/2022 7:14 AM --  Laboratory Lab Results: Recent Labs    09/10/22 1807 09/11/22 0101  WBC 8.1 8.9  HGB 13.2 12.2*  HCT 38.0* 36.0*  PLT 265 245   BMET Recent Labs    09/10/22 1807 09/11/22 0101  NA 138 137  K 3.2* 3.4*  CL 104 107  CO2 24 22  GLUCOSE 104* 114*  BUN 17 19  CREATININE 1.30* 1.47*  CALCIUM 8.7* 8.2*    COAG Lab Results  Component Value Date   INR 1.5 (H) 09/10/2022   INR 1.1 01/18/2020   INR 1.3 (H) 08/30/2019   No results found for: "PTT"

## 2022-09-11 NOTE — Progress Notes (Addendum)
[place] for heparin  Indication:  PVD  Allergies  Allergen Reactions   Oxycodone Nausea And Vomiting   Statins Other (See Comments)    Muscle and Bone pain    Amiodarone     Caused issues and was stopped by MD- possible was the cause for the patient's heart "going in and out of rhythm" multiple times   Donepezil Other (See Comments)    Per [facility] Insomnia   Imipramine Other (See Comments)    Per [facility]   Oxybutynin Chloride Swelling and Other (See Comments)    Per [facility]    Patient Measurements: Height: 5\' 9"  (175.3 cm) Weight: 80.6 kg (177 lb 11.1 oz) IBW/kg (Calculated) : 70.7 Heparin Dosing Weight: 81kg  Vital Signs: Temp: 97.8 F (36.6 C) (03/21 0738) Temp Source: Oral (03/21 0738) BP: 175/61 (03/21 1003) Pulse Rate: 60 (03/21 1003)  Labs: Recent Labs    09/10/22 1807 09/10/22 2250 09/11/22 0101  HGB 13.2  --  12.2*  HCT 38.0*  --  36.0*  PLT 265  --  245  APTT  --  147*  --   LABPROT 17.8*  --   --   INR 1.5*  --   --   HEPARINUNFRC >1.10*  --   --   CREATININE 1.30*  --  1.47*    Estimated Creatinine Clearance: 38.7 mL/min (A) (by C-G formula based on SCr of 1.47 mg/dL (H)).   Medical History: Past Medical History:  Diagnosis Date   Arthritis    "knees, elbows" (03/18/2018)   Chronic edema    a. Chronic RLE edema.   COPD (chronic obstructive pulmonary disease) (HCC)    Coronary artery disease    a. MI s/p balloon 1996, details unclear.   Dysrhythmia    PROXIMAL MARGIN FIBRILATION   GERD (gastroesophageal reflux disease)    Hyperlipidemia    Hypertension    Myocardial infarction ([place]) 1996   PAD (peripheral artery disease) ([place])    a. s/p stenting 08/2012, 02/2013.    PAF (paroxysmal atrial fibrillation) ([place])    Sleep apnea    "have mask; have to start wearing it" (03/18/2018)   Stroke [facility]) ~ 2014   denies residual on 03/18/2018   Thrombosis of lower extremity    a. Listed on patient's medical bracelet -  at [place].   Type II diabetes mellitus (HCC)     Medications:  Medications Prior to Admission  Medication Sig Dispense Refill Last Dose   acetaminophen (TYLENOL) 500 MG tablet Take 500 mg by mouth 4 (four) times daily as needed for moderate pain or headache.   09/04/2022   albuterol (VENTOLIN HFA) 108 (90 Base) MCG/ACT inhaler Inhale 1 puff into the lungs every 6 (six) hours as needed for shortness of breath.   Past Month   apixaban (ELIQUIS) 2.5 MG TABS tablet TAKE 1 TABLET (2.5 MG TOTAL) BY MOUTH TWO TIMES DAILY. (Patient taking differently: Take 2.5 mg by mouth 2 (two) times daily.) 60 tablet 3 09/10/2022 at 0800   Cholecalciferol (VITAMIN D) 50 MCG (2000 UT) tablet Take 2,000 Units by mouth daily.   09/09/2022   clopidogrel (PLAVIX) 75 MG tablet Take 1 tablet by mouth daily.   09/09/2022   ezetimibe (ZETIA) 10 MG tablet Take 10 mg by mouth every evening.    09/09/2022   ferrous sulfate 325 (65 FE) MG tablet Take 325 mg by mouth daily with breakfast.   09/09/2022   hydrochlorothiazide (MICROZIDE) 12.5 MG capsule  Take 12.5 mg by mouth daily.   09/09/2022   memantine (NAMENDA) 10 MG tablet Take 20 mg by mouth at bedtime.   09/09/2022   metoprolol tartrate (LOPRESSOR) 50 MG tablet Take 50 mg by mouth 2 (two) times daily.   09/09/2022 at 1700   nitroGLYCERIN (NITROSTAT) 0.4 MG SL tablet Place 0.4 mg under the tongue every 5 (five) minutes x 3 doses as needed for chest pain.   unknown   pantoprazole (PROTONIX) 40 MG tablet Take 40 mg by mouth daily before breakfast.   09/09/2022   ramipril (ALTACE) 10 MG capsule Take 10 mg by mouth 2 (two) times daily.   09/09/2022   rivastigmine (EXELON) 6 MG capsule Take 6 mg by mouth 2 (two) times daily.   09/09/2022   vitamin B-12 (CYANOCOBALAMIN) 500 MCG tablet Take 500 mcg by mouth daily.   09/09/2022   atorvastatin (LIPITOR) 40 MG tablet Take 1 tablet (40 mg total) by mouth daily at 6 PM. (Patient not taking: Reported on 09/10/2022) 30 tablet 1 Not Taking   Tiotropium  Bromide-Olodaterol 2.5-2.5 MCG/ACT AERS Take by mouth. (Patient not taking: Reported on 09/10/2022)   Not Taking   Scheduled:   arformoterol  15 mcg Nebulization BID   And   umeclidinium bromide  1 puff Inhalation Daily   atorvastatin  40 mg Oral q1800   clopidogrel  75 mg Oral Daily   ezetimibe  10 mg Oral QPM   ferrous sulfate  325 mg Oral Q breakfast   memantine  20 mg Oral QHS   metoprolol tartrate  50 mg Oral BID   pantoprazole  40 mg Oral QAC breakfast   potassium chloride  40 mEq Oral Once   rivastigmine  6 mg Oral BID   sodium chloride flush  3 mL Intravenous Q12H   Infusions:   sodium chloride 75 mL/hr at 09/10/22 2057   sodium chloride     alteplase (LIMB ISCHEMIA) 10 mg in normal saline (0.02 mg/mL) infusion 1 mg/hr (09/11/22 0941)   heparin 800 Units/hr (09/11/22 0949)    Assessment: 60 yoM with hx PVD admitted with occluded bypass graft. Pt on apixaban PTA. Pt s/p PV angio with lysis started for limb ischemia. Heparin infusion at 800 units/h - discussed with Dr. Virl Cagey, will hold off on titrating this with ongoing lysis.  Goal of Therapy:  Heparin level 0.2-0.5 units/ml aPTT 55-85 Monitor platelets by anticoagulation protocol: Yes   Plan:  Alteplase 1mg /h Heparin 800 units/h Check q6h aPTT, heparin level, CBC, fibrinogen  ADDENDUM 1523: Initial aPTT slightly elevated at 90 seconds, heparin level falsely elevated by DOAC. Pt received 5000 unit bolus during case which could be leaving aPTT slightly prolonged. Will leave current infusion at 800 units/h and recheck in 6h.   Arrie Senate, PharmD, BCPS, [facility] Clinical Pharmacist [phone] Please check AMION for all [place] numbers 09/11/2022

## 2022-09-11 NOTE — Op Note (Signed)
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  Ultrasound-guided micropuncture access of the right common femoral artery in retrograde fashion 2.  Aortogram 3.  Second-order cannulation, right lower extremity angiogram 4.  Lytic catheter placement extending from from the right femoral to below-knee popliteal artery bypass into the tibioperoneal trunk 5.  Moderate sedation time 31 minutes, contrast volume 70 mL   Indications:   Patient is an 82 year old male well-known to the vascular surgery service having undergone multiple interventions to the right lower extremity, most recently lysis, realigning of previous femoral to below-knee popliteal artery bypass, stents extending into the tibioperoneal trunk.  Donald Gordon is appreciated waxing and waning numbness in the right leg for quite a few months, but 10 days ago noted acute pain and a numb sensation.  He denied motor dysfunction.  After discussing the risk and benefits of right lower extremity angiography in an effort to define, and hopefully improve perfusion, Donald Gordon elected to proceed.  He is aware that this will likely require lytic catheter placement if the bypass was found to be occluded.  Findings:  No flow-limiting stenosis appreciated in the aortoiliac segments bilaterally On the right: Widely patent common femoral artery, profunda.  The previous femoral to below-knee popliteal artery bypass was occluded.  There is reconstitution at the distal tibioperoneal trunk with the peroneal and posterior tibial arteries filling.  The arteries were underfilled.  The peroneal continued to the level of the ankle and posterior tibial into the foot.   Procedure:  The patient was identified in the holding area and taken to room 8.  The patient was then  placed supine on the table and prepped and draped in the usual sterile fashion.  A time out was called.  Ultrasound was used to evaluate the left common femoral artery.  It was patent .  A digital ultrasound image was acquired.  A micropuncture needle was used to access the left common femoral artery under ultrasound guidance.  An 018 wire was advanced without resistance and a micropuncture sheath was placed.  The 018 wire was removed and a benson wire was placed.  The micropuncture sheath was exchanged for a 5 french sheath.  An omniflush catheter was advanced over the wire to the level of L-1.  An abdominal angiogram was obtained.  Next, using the omniflush catheter and a benson wire, the aortic bifurcation was crossed and the catheter was placed into theright external iliac artery and right runoff was obtained.    I elected to attempt recanalization of the right-sided femoral to below-knee popliteal artery bypass for secondary bypass patency.  The patient was heparinized with 5000 units IV heparin, and a 6 [place] by 45 cm sheath was parked in the right common femoral artery.  From this location, a series of wires and catheters were used to traverse the occluded bypass graft.  Distally, true lumen was confirmed with the use of angiography.  Next, I elected to place a 50 cm lytic catheter.  This lytic catheter was placed from the distal tibioperoneal trunk, through the bypass graft.  The sheath was sewn into place.  The lytic catheter will run at 1 mg/h, and the sheath will run 800 units of heparin per hour. Plan for lytic recheck tomorrow.  Impression: Successful lytic catheter placement in the occluded femoral to below-knee popliteal  artery bypass extending into the tibioperoneal trunk.   Donald Santee, MD [facility] [place] Office: [phone]

## 2022-09-11 NOTE — Progress Notes (Signed)
  Daily Progress Note   Subjective: Some numbness in the right foot. No motor deficits. Pain tolerable  Objective: Vitals:   09/11/22 0738 09/11/22 0743  BP: (!) 154/81   Pulse: (!) 55 72  Resp: 19 18  Temp: 97.8 F (36.6 C)   SpO2: 92% 95%    Physical Examination Venous signal in the right foot Palpable groin pulses   ASSESSMENT/PLAN:  Patient is an 82 year old male with history of multiple vascular interventions to the right lower extremity.  For the last 3 to 4 months, he is complained of some waxing waning paresthesias but, but over the last 10 days, appreciated significant pain, and associated paresthesias.  Denies motor dysfunction.  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  After discussing risk and benefits of right lower extremity angiogram, with lytic catheter placement, [place] elected to proceed.   Cassandria Santee MD MS [facility] [phone] 09/11/2022  8:53 AM

## 2022-09-11 NOTE — Progress Notes (Signed)
[place] for heparin  Indication:  PVD  Allergies  Allergen Reactions   Oxycodone Nausea And Vomiting   Statins Other (See Comments)    Muscle and Bone pain    Amiodarone     Caused issues and was stopped by MD- possible was the cause for the patient's heart "going in and out of rhythm" multiple times   Donepezil Other (See Comments)    Per [facility] Insomnia   Imipramine Other (See Comments)    Per [facility]   Oxybutynin Chloride Swelling and Other (See Comments)    Per [facility]    Patient Measurements: Height: 5\' 9"  (175.3 cm) Weight: 80.6 kg (177 lb 11.1 oz) IBW/kg (Calculated) : 70.7 Heparin Dosing Weight: 81kg  Vital Signs: Temp: 97.8 F (36.6 C) (03/21 2002) Temp Source: Oral (03/21 2002) BP: 145/61 (03/21 2132) Pulse Rate: 64 (03/21 2132)  Labs: Recent Labs    09/10/22 1807 09/10/22 2250 09/11/22 0101 09/11/22 1423 09/11/22 2059  HGB 13.2  --  12.2* 11.7* 11.0*  HCT 38.0*  --  36.0* 35.0* 33.1*  PLT 265  --  245 164 180  APTT  --  147*  --  90* 67*  LABPROT 17.8*  --   --   --   --   INR 1.5*  --   --   --   --   HEPARINUNFRC >1.10*  --   --  >1.10* >1.10*  CREATININE 1.30*  --  1.47*  --   --      Estimated Creatinine Clearance: 38.7 mL/min (A) (by C-G formula based on SCr of 1.47 mg/dL (H)).   Medical History: Past Medical History:  Diagnosis Date   Arthritis    "knees, elbows" (03/18/2018)   Chronic edema    a. Chronic RLE edema.   COPD (chronic obstructive pulmonary disease) (HCC)    Coronary artery disease    a. MI s/p balloon 1996, details unclear.   Dysrhythmia    PROXIMAL MARGIN FIBRILATION   GERD (gastroesophageal reflux disease)    Hyperlipidemia    Hypertension    Myocardial infarction ([place]) 1996   PAD (peripheral artery disease) ([place])    a. s/p stenting 08/2012, 02/2013.    PAF (paroxysmal atrial fibrillation) ([place])    Sleep apnea    "have mask; have to start wearing it" (03/18/2018)   Stroke [facility]) ~  2014   denies residual on 03/18/2018   Thrombosis of lower extremity    a. Listed on patient's medical bracelet - at [place].   Type II diabetes mellitus (HCC)     Medications:  Medications Prior to Admission  Medication Sig Dispense Refill Last Dose   acetaminophen (TYLENOL) 500 MG tablet Take 500 mg by mouth 4 (four) times daily as needed for moderate pain or headache.   09/04/2022   albuterol (VENTOLIN HFA) 108 (90 Base) MCG/ACT inhaler Inhale 1 puff into the lungs every 6 (six) hours as needed for shortness of breath.   Past Month   apixaban (ELIQUIS) 2.5 MG TABS tablet TAKE 1 TABLET (2.5 MG TOTAL) BY MOUTH TWO TIMES DAILY. (Patient taking differently: Take 2.5 mg by mouth 2 (two) times daily.) 60 tablet 3 09/10/2022 at 0800   Cholecalciferol (VITAMIN D) 50 MCG (2000 UT) tablet Take 2,000 Units by mouth daily.   09/09/2022   clopidogrel (PLAVIX) 75 MG tablet Take 1 tablet by mouth daily.   09/09/2022   ezetimibe (ZETIA) 10 MG tablet Take 10 mg by mouth  every evening.    09/09/2022   ferrous sulfate 325 (65 FE) MG tablet Take 325 mg by mouth daily with breakfast.   09/09/2022   hydrochlorothiazide (MICROZIDE) 12.5 MG capsule Take 12.5 mg by mouth daily.   09/09/2022   memantine (NAMENDA) 10 MG tablet Take 20 mg by mouth at bedtime.   09/09/2022   metoprolol tartrate (LOPRESSOR) 50 MG tablet Take 50 mg by mouth 2 (two) times daily.   09/09/2022 at 1700   nitroGLYCERIN (NITROSTAT) 0.4 MG SL tablet Place 0.4 mg under the tongue every 5 (five) minutes x 3 doses as needed for chest pain.   unknown   pantoprazole (PROTONIX) 40 MG tablet Take 40 mg by mouth daily before breakfast.   09/09/2022   ramipril (ALTACE) 10 MG capsule Take 10 mg by mouth 2 (two) times daily.   09/09/2022   rivastigmine (EXELON) 6 MG capsule Take 6 mg by mouth 2 (two) times daily.   09/09/2022   vitamin B-12 (CYANOCOBALAMIN) 500 MCG tablet Take 500 mcg by mouth daily.   09/09/2022   atorvastatin (LIPITOR) 40 MG tablet Take 1 tablet (40 mg  total) by mouth daily at 6 PM. (Patient not taking: Reported on 09/10/2022) 30 tablet 1 Not Taking   Tiotropium Bromide-Olodaterol 2.5-2.5 MCG/ACT AERS Take by mouth. (Patient not taking: Reported on 09/10/2022)   Not Taking   Scheduled:   arformoterol  15 mcg Nebulization BID   And   umeclidinium bromide  1 puff Inhalation Daily   atorvastatin  40 mg Oral q1800   Chlorhexidine Gluconate Cloth  6 each Topical Daily   clopidogrel  75 mg Oral Daily   ezetimibe  10 mg Oral QPM   ferrous sulfate  325 mg Oral Q breakfast   memantine  20 mg Oral QHS   metoprolol tartrate  50 mg Oral BID   pantoprazole  40 mg Oral QAC breakfast   potassium chloride  40 mEq Oral Once   rivastigmine  6 mg Oral BID   sodium chloride flush  3 mL Intravenous Q12H   Infusions:   sodium chloride 75 mL/hr at 09/11/22 0830   sodium chloride     alteplase (LIMB ISCHEMIA) 10 mg in normal saline (0.02 mg/mL) infusion 1 mg/hr (09/11/22 2100)   heparin 800 Units/hr (09/11/22 2100)    Assessment: 61 yoM with hx PVD admitted with occluded bypass graft. Pt on apixaban PTA. Pt s/p PV angio with lysis started for limb ischemia. Heparin infusion at 800 units/h - discussed with Dr. Virl Cagey, will hold off on titrating this with ongoing lysis.  aPTT 67, therapeutic Heparin level >1.1 (not correlating with aPTT given recent apixaban administration) Current heparin infusion rate: 800 units/hr  Hgb 11, Plt 180 - stable No s/sx of bleeding, per RN report  Goal of Therapy:  Heparin level 0.2-0.5 units/ml aPTT 55-85 Monitor platelets by anticoagulation protocol: Yes   Plan:  Continue Alteplase 1mg /h Continue Heparin 800 units/h Check q6h aPTT, heparin level, CBC, fibrinogen Monitor for s/sx of bleeding   Luisa Hart, PharmD, BCPS Clinical Pharmacist 09/11/2022 10:26 PM   Please refer to AMION for pharmacy phone number

## 2022-09-11 NOTE — TOC CM/SW Note (Signed)
CM spoke to pt and lives alone but dtr assist with care. Will continue to follow for dc needs. [place], Heart Failure TOC CM [phone]

## 2022-09-11 NOTE — Progress Notes (Signed)
[facility] PROGRESS NOTE  Patient: Donald Gordon [id number]   PCP: [facility] DOB: March 31, 1941   DOA: 09/10/2022   DOS: 09/11/2022    Highly appreciate Dr Virl Cagey' assistance in this case.  Pt transferred to the ICU after the procedure. TR[facility] will sign off.  D/w Dr Virl Cagey.  Author: Berle Mull, MD [facility] 09/11/2022 10:05 AM   If 7PM-7AM, please contact night-coverage at [email or web address]

## 2022-09-12 ENCOUNTER — Encounter (HOSPITAL_COMMUNITY)
Disposition: A | Discharge status: Home / Self Care | Payer: Self-pay | Source: Other Acute Inpatient Hospital | Attending: Vascular Surgery

## 2022-09-12 ENCOUNTER — Encounter (HOSPITAL_COMMUNITY): Payer: Self-pay | Admitting: Vascular Surgery

## 2022-09-12 DIAGNOSIS — T82868A Thrombosis of vascular prosthetic devices, implants and grafts, initial encounter: Secondary | ICD-10-CM

## 2022-09-12 HISTORY — PX: PERIPHERAL VASCULAR THROMBECTOMY: CATH118306

## 2022-09-12 LAB — CBC
HCT: 31.7 % — ABNORMAL LOW (ref 39.0–52.0)
HCT: 37.8 % — ABNORMAL LOW (ref 39.0–52.0)
Hemoglobin: 10.8 g/dL — ABNORMAL LOW (ref 13.0–17.0)
Hemoglobin: 12.5 g/dL — ABNORMAL LOW (ref 13.0–17.0)
MCH: 32.9 pg (ref 26.0–34.0)
MCH: 32.7 pg (ref 26.0–34.0)
MCHC: 34.1 g/dL (ref 30.0–36.0)
MCHC: 33.1 g/dL (ref 30.0–36.0)
MCV: 96.6 fL (ref 80.0–100.0)
MCV: 99 fL (ref 80.0–100.0)
Platelets: 155 10*3/uL (ref 150–400)
Platelets: 164 10*3/uL (ref 150–400)
RBC: 3.28 MIL/uL — ABNORMAL LOW (ref 4.22–5.81)
RBC: 3.82 MIL/uL — ABNORMAL LOW (ref 4.22–5.81)
RDW: 12.9 % (ref 11.5–15.5)
RDW: 13.1 % (ref 11.5–15.5)
WBC: 7.1 10*3/uL (ref 4.0–10.5)
WBC: 7.8 10*3/uL (ref 4.0–10.5)
nRBC: 0 % (ref 0.0–0.2)
nRBC: 0 % (ref 0.0–0.2)

## 2022-09-12 LAB — APTT
aPTT: 67 seconds — ABNORMAL HIGH (ref 24–36)
aPTT: 57 seconds — ABNORMAL HIGH (ref 24–36)

## 2022-09-12 LAB — FIBRINOGEN
Fibrinogen: 270 mg/dL (ref 210–475)
Fibrinogen: 311 mg/dL (ref 210–475)

## 2022-09-12 LAB — BASIC METABOLIC PANEL
Anion gap: 10 (ref 5–15)
BUN: 11 mg/dL (ref 8–23)
CO2: 20 mmol/L — ABNORMAL LOW (ref 22–32)
Calcium: 8.4 mg/dL — ABNORMAL LOW (ref 8.9–10.3)
Chloride: 110 mmol/L (ref 98–111)
Creatinine, Ser: 1.05 mg/dL (ref 0.61–1.24)
GFR, Estimated: >60 mL/min (ref >60)
Glucose, Bld: 116 mg/dL — ABNORMAL HIGH (ref 70–99)
Potassium: 4 mmol/L (ref 3.5–5.1)
Sodium: 140 mmol/L (ref 135–145)

## 2022-09-12 LAB — HEPARIN LEVEL (UNFRACTIONATED)
Heparin Unfractionated: >1.1 IU/mL — ABNORMAL HIGH (ref 0.30–0.70)
Heparin Unfractionated: >1.1 IU/mL — ABNORMAL HIGH (ref 0.30–0.70)

## 2022-09-12 SURGERY — PERIPHERAL VASCULAR THROMBECTOMY
Anesthesia: LOCAL

## 2022-09-12 MED ORDER — HEPARIN (PORCINE) IN NACL 1000-0.9 UT/500ML-% IV SOLN
INTRAVENOUS | Status: DC | PRN
  Administered 2022-09-12: 500 mL

## 2022-09-12 MED ORDER — LIDOCAINE HCL (PF) 1 % IJ SOLN
INTRAMUSCULAR | Status: AC
  Filled 2022-09-12: qty 30

## 2022-09-12 MED ORDER — MIDAZOLAM HCL 2 MG/2ML IJ SOLN
INTRAMUSCULAR | Status: AC
  Filled 2022-09-12: qty 2

## 2022-09-12 MED ORDER — LIDOCAINE HCL (PF) 1 % IJ SOLN
INTRAMUSCULAR | Status: DC | PRN
  Administered 2022-09-12: 5 mL

## 2022-09-12 MED ORDER — IODIXANOL 320 MG/ML IV SOLN
INTRAVENOUS | Status: DC | PRN
  Administered 2022-09-12: 30 mL

## 2022-09-12 MED ORDER — FENTANYL CITRATE (PF) 100 MCG/2ML IJ SOLN
INTRAMUSCULAR | Status: DC | PRN
  Administered 2022-09-12: 50 ug via INTRAVENOUS

## 2022-09-12 MED ORDER — MIDAZOLAM HCL 2 MG/2ML IJ SOLN
INTRAMUSCULAR | Status: DC | PRN
  Administered 2022-09-12: 1 mg via INTRAVENOUS

## 2022-09-12 MED ORDER — FENTANYL CITRATE (PF) 100 MCG/2ML IJ SOLN
INTRAMUSCULAR | Status: AC
  Filled 2022-09-12: qty 2

## 2022-09-12 SURGICAL SUPPLY — 7 items
CLOSURE PERCLOSE PROSTYLE (VASCULAR PRODUCTS) IMPLANT
COVER DOME SNAP 22 D (MISCELLANEOUS) IMPLANT
PROTECTION STATION PRESSURIZED (MISCELLANEOUS) ×1
STATION PROTECTION PRESSURIZED (MISCELLANEOUS) IMPLANT
TRAY PV CATH (CUSTOM PROCEDURE TRAY) IMPLANT
WIRE HITORQ VERSACORE ST 145CM (WIRE) IMPLANT
WIRE VERSACORE LOC 115CM (WIRE) IMPLANT

## 2022-09-12 NOTE — Progress Notes (Signed)
Pt educated on required bedrest and activity limitations due to anticoagulation and sheath removal after return to unit. Reinforced education and time of bedrest at 1500 when patient asked how much time left on bedrest, call light in reach, and pt educated on use of bed pan when needed. At approximately 1600, pt found standing at side of bed removing lines and attempting to reach bathroom. On return to bed, groin site remained a level 0 with no hematoma. Pt oriented to situation, time, and place. Pt educated again on safe activity limitations and risks of noncompliance. Bed alarm activated.

## 2022-09-12 NOTE — Progress Notes (Addendum)
  Progress Note    09/12/2022 7:05 AM 1 Day Post-Op  Subjective:  a little nauseated.  Foot feels better    Vitals:   09/12/22 0530 09/12/22 0600  BP: (!) 149/53 (!) 152/58  Pulse: 65 64  Resp: 15 18  Temp:    SpO2: 97% 97%    Physical Exam: General:  no distress Cardiac:  regular Lungs:  non labored Extremities:  + doppler flow right DP/pero and left DP   CBC    Component Value Date/Time   WBC 7.1 09/12/2022 0256   RBC 3.28 (L) 09/12/2022 0256   HGB 10.8 (L) 09/12/2022 0256   HGB 11.0 (L) 12/05/2020 1407   HCT 31.7 (L) 09/12/2022 0256   HCT 35.5 (L) 12/05/2020 1407   PLT 155 09/12/2022 0256   PLT 254 12/05/2020 1407   MCV 96.6 09/12/2022 0256   MCV 85 12/05/2020 1407   MCH 32.9 09/12/2022 0256   MCHC 34.1 09/12/2022 0256   RDW 12.9 09/12/2022 0256   RDW 20.4 (H) 12/05/2020 1407   LYMPHSABS 2.0 09/10/2022 1807   MONOABS 1.1 (H) 09/10/2022 1807   EOSABS 0.1 09/10/2022 1807   BASOSABS 0.1 09/10/2022 1807    BMET    Component Value Date/Time   NA 139 09/11/2022 2243   NA 139 12/05/2020 1407   K 3.1 (L) 09/11/2022 2243   CL 106 09/11/2022 2243   CO2 21 (L) 09/11/2022 2243   GLUCOSE 132 (H) 09/11/2022 2243   BUN 15 09/11/2022 2243   BUN 25 12/05/2020 1407   CREATININE 1.30 (H) 09/11/2022 2243   CREATININE 1.51 (H) 08/31/2020 1046   CALCIUM 7.9 (L) 09/11/2022 2243   GFRNONAA 55 (L) 09/11/2022 2243   GFRNONAA 46 (L) 08/31/2020 1046   GFRAA 52 (L) 01/19/2020 0547    INR    Component Value Date/Time   INR 1.5 (H) 09/10/2022 1807     Intake/Output Summary (Last 24 hours) at 09/12/2022 [id number] Last data filed at 09/12/2022 0600 Gross per 24 hour  Intake 2675.48 ml  Output 900 ml  Net 1775.48 ml      Assessment/Plan:  82 y.o. male is s/p:  Lytic catheter placement extending from from the right femoral to below-knee popliteal artery bypass into the tibioperoneal trunk   1 Day Post-Op   -pt doing well this morning with + doppler flow right  foot -plan for back to PV lab today for lytic recheck -DVT prophylaxis:  heparin gtt   Leontine Locket, PA-C [facility] [phone] 09/12/2022 7:05 AM   VASCULAR STAFF ADDENDUM: I have independently interviewed and examined the patient. I agree with the above.  Bypass recanalized NPO. Plan for cath lab today for repeat angiography, likely catheter removal.  Access site soft. Leg feels better Nausea improved   Cassandria Santee, MD [facility] Phone Number: [phone] 09/12/2022 7:39 AM

## 2022-09-12 NOTE — Progress Notes (Signed)
Received msg from Museum/gallery curator at [facility].- Pt was sent from the [facility] ED to [facility] on 09/10/22- Pt is seen at the [facility] by the [place] team- PCP-Dr Shirleen Schirmer, [place] for discharge planning needsBenjamine Mola Jones-[phone]-ext. [phone]

## 2022-09-12 NOTE — Op Note (Addendum)
DATE OF SERVICE: 09/12/2022  PATIENT:  Donald Gordon  82 y.o. male  PRE-OPERATIVE DIAGNOSIS:  arterial thrombosis of right lower extremity; ongoing catheter directed thrombolysis   POST-OPERATIVE DIAGNOSIS:  Same  PROCEDURE:   1) Follow up catheter directed thrombolysis with cessation of thrombolysis 2) Right lower extremity angiogram with third order cannulation (14mL total contrast) 3) Conscious sedation (11 minutes)  SURGEON:  [name]. Stanford Breed, MD  ASSISTANT: none  ANESTHESIA:   local and IV sedation  ESTIMATED BLOOD LOSS: minimal  LOCAL MEDICATIONS USED:  LIDOCAINE   COUNTS: confirmed correct.  PATIENT DISPOSITION:  PACU - hemodynamically stable.   Delay start of Pharmacological VTE agent (>24hrs) due to surgical blood loss or risk of bleeding: no  INDICATION FOR PROCEDURE: Donald Gordon is a 82 y.o. male with ongoing catheter directed thrombolysis. After careful discussion of risks, benefits, and alternatives the patient was offered catheter recheck angiogram. The patient understood and wished to proceed.  OPERATIVE FINDINGS:   Left lower extremity: Common femoral artery: widely patent  Profunda femoris artery: widely patent  Femoral - below knee popliteal bypass: widely patent with stenting Superficial femoral artery: occluded Popliteal artery: fills via stented bypass Anterior tibial artery: occluded Tibioperoneal trunk: widely patent Peroneal artery: widely patent Posterior tibial artery: widely patent Pedal circulation: fills via PT  DESCRIPTION OF PROCEDURE: After identification of the patient in the pre-operative holding area, the patient was transferred to the operating room. The patient was positioned supine on the operating room table. Anesthesia was induced. The groins was prepped and draped in standard fashion. A surgical pause was performed confirming correct patient, procedure, and operative location.  The  groin was anesthetized with subcutaneous  injection of 1% lidocaine. The infusion catheter was removed over a versacore wire. Angiogram was performed in stations down the leg. Good technical result from thrombolysis was noted. All endovascular equipment was removed.  A perclose was used to close the arteriotomy. Hemostasis was excellent upon completion.  Conscious sedation was administered with the use of IV fentanyl and midazolam under continuous physician and nurse monitoring.  Heart rate, blood pressure, and oxygen saturation were continuously monitored.  Total sedation time was 11 minutes  Upon completion of the case instrument and sharps counts were confirmed correct. The patient was transferred to the PACU in good condition. I was present for all portions of the procedure.  PLAN: ASA 81mg  PO QD. Plavix 75mg  PO QD. Start DOAC tomorrow. High intensity statin therapy.   [name]. Stanford Breed, MD [facility] Phone Number: [phone] 09/12/2022 1:43 PM

## 2022-09-12 NOTE — Progress Notes (Addendum)
[employer] for heparin  Indication:  PVD  Allergies  Allergen Reactions   Oxycodone Nausea And Vomiting   Statins Other (See Comments)    Muscle and Bone pain    Amiodarone     Caused issues and was stopped by MD- possible was the cause for the patient's heart "going in and out of rhythm" multiple times   Donepezil Other (See Comments)    Per [facility] Insomnia   Imipramine Other (See Comments)    Per [facility]   Oxybutynin Chloride Swelling and Other (See Comments)    Per [facility]    Patient Measurements: Height: 5\' 9"  (175.3 cm) Weight: 80.6 kg (177 lb 11.1 oz) IBW/kg (Calculated) : 70.7 Heparin Dosing Weight: 81kg  Vital Signs: Temp: 97.8 F (36.6 C) (03/22 0310) Temp Source: Oral (03/22 0310) BP: 152/58 (03/22 0600) Pulse Rate: 64 (03/22 0600)  Labs: Recent Labs    09/10/22 1807 09/10/22 2250 09/11/22 0101 09/11/22 1423 09/11/22 2059 09/11/22 2243 09/12/22 0256  HGB 13.2  --  12.2* 11.7* 11.0*  --  10.8*  HCT 38.0*  --  36.0* 35.0* 33.1*  --  31.7*  PLT 265  --  245 164 180  --  155  APTT  --    < >  --  90* 67*  --  67*  LABPROT 17.8*  --   --   --   --   --   --   INR 1.5*  --   --   --   --   --   --   HEPARINUNFRC >1.10*  --   --  >1.10* >1.10*  --  >1.10*  CREATININE 1.30*  --  1.47*  --   --  1.30*  --    < > = values in this interval not displayed.     Estimated Creatinine Clearance: 43.8 mL/min (A) (by C-G formula based on SCr of 1.3 mg/dL (H)).   Medical History: Past Medical History:  Diagnosis Date   Arthritis    "knees, elbows" (03/18/2018)   Chronic edema    a. Chronic RLE edema.   COPD (chronic obstructive pulmonary disease) (HCC)    Coronary artery disease    a. MI s/p balloon 1996, details unclear.   Dysrhythmia    PROXIMAL MARGIN FIBRILATION   GERD (gastroesophageal reflux disease)    Hyperlipidemia    Hypertension    Myocardial infarction ([place]) 1996   PAD (peripheral artery disease) ([place])    a. s/p  stenting 08/2012, 02/2013.    PAF (paroxysmal atrial fibrillation) ([place])    Sleep apnea    "have mask; have to start wearing it" (03/18/2018)   Stroke [facility]) ~ 2014   denies residual on 03/18/2018   Thrombosis of lower extremity    a. Listed on patient's medical bracelet - at [place].   Type II diabetes mellitus (HCC)     Medications:  Medications Prior to Admission  Medication Sig Dispense Refill Last Dose   acetaminophen (TYLENOL) 500 MG tablet Take 500 mg by mouth 4 (four) times daily as needed for moderate pain or headache.   09/04/2022   albuterol (VENTOLIN HFA) 108 (90 Base) MCG/ACT inhaler Inhale 1 puff into the lungs every 6 (six) hours as needed for shortness of breath.   Past Month   apixaban (ELIQUIS) 2.5 MG TABS tablet TAKE 1 TABLET (2.5 MG TOTAL) BY MOUTH TWO TIMES DAILY. (Patient taking differently: Take 2.5 mg by mouth 2 (two) times  daily.) 60 tablet 3 09/10/2022 at 0800   Cholecalciferol (VITAMIN D) 50 MCG (2000 UT) tablet Take 2,000 Units by mouth daily.   09/09/2022   clopidogrel (PLAVIX) 75 MG tablet Take 1 tablet by mouth daily.   09/09/2022   ezetimibe (ZETIA) 10 MG tablet Take 10 mg by mouth every evening.    09/09/2022   ferrous sulfate 325 (65 FE) MG tablet Take 325 mg by mouth daily with breakfast.   09/09/2022   hydrochlorothiazide (MICROZIDE) 12.5 MG capsule Take 12.5 mg by mouth daily.   09/09/2022   memantine (NAMENDA) 10 MG tablet Take 20 mg by mouth at bedtime.   09/09/2022   metoprolol tartrate (LOPRESSOR) 50 MG tablet Take 50 mg by mouth 2 (two) times daily.   09/09/2022 at 1700   nitroGLYCERIN (NITROSTAT) 0.4 MG SL tablet Place 0.4 mg under the tongue every 5 (five) minutes x 3 doses as needed for chest pain.   unknown   pantoprazole (PROTONIX) 40 MG tablet Take 40 mg by mouth daily before breakfast.   09/09/2022   ramipril (ALTACE) 10 MG capsule Take 10 mg by mouth 2 (two) times daily.   09/09/2022   rivastigmine (EXELON) 6 MG capsule Take 6 mg by mouth 2 (two) times daily.    09/09/2022   vitamin B-12 (CYANOCOBALAMIN) 500 MCG tablet Take 500 mcg by mouth daily.   09/09/2022   atorvastatin (LIPITOR) 40 MG tablet Take 1 tablet (40 mg total) by mouth daily at 6 PM. (Patient not taking: Reported on 09/10/2022) 30 tablet 1 Not Taking   Tiotropium Bromide-Olodaterol 2.5-2.5 MCG/ACT AERS Take by mouth. (Patient not taking: Reported on 09/10/2022)   Not Taking   Scheduled:   arformoterol  15 mcg Nebulization BID   And   umeclidinium bromide  1 puff Inhalation Daily   atorvastatin  40 mg Oral q1800   Chlorhexidine Gluconate Cloth  6 each Topical Daily   clopidogrel  75 mg Oral Daily   ezetimibe  10 mg Oral QPM   ferrous sulfate  325 mg Oral Q breakfast   memantine  20 mg Oral QHS   metoprolol tartrate  50 mg Oral BID   pantoprazole  40 mg Oral QAC breakfast   rivastigmine  6 mg Oral BID   sodium chloride flush  3 mL Intravenous Q12H   Infusions:   sodium chloride 75 mL/hr at 09/11/22 0830   sodium chloride     alteplase (LIMB ISCHEMIA) 10 mg in normal saline (0.02 mg/mL) infusion 1 mg/hr (09/12/22 0600)   heparin 800 Units/hr (09/12/22 0600)    Assessment: 81 yoM with hx PVD admitted with occluded bypass graft. Pt on apixaban PTA. Pt s/p PV angio with lysis started for limb ischemia. Heparin infusion at 800 units/h - discussed with Dr. Virl Cagey, will hold off on titrating this with ongoing lysis.  aPTT this morning is therapeutic at 67 seconds, CBC and fibrinogen stable.   Goal of Therapy:  Heparin level 0.2-0.5 units/ml aPTT 55-85 Monitor platelets by anticoagulation protocol: Yes   Plan:  Alteplase 1mg /h Heparin 800 units/h Check q6h aPTT, heparin level, CBC, fibrinogen   ADDENDUM repeat aPTT within goal range at 57 seconds, CBC and fibrinogen stable. Cath later today.  Arrie Senate, PharmD, BCPS, [facility] Clinical Pharmacist [phone] Please check AMION for all [place] numbers 09/12/2022

## 2022-09-13 MED ORDER — APIXABAN 5 MG PO TABS
5.0000 mg | ORAL_TABLET | Freq: Once | ORAL | Status: AC
  Administered 2022-09-13: 5 mg via ORAL
  Filled 2022-09-13: qty 1

## 2022-09-13 MED ORDER — APIXABAN 5 MG PO TABS: 5.0000 mg | ORAL_TABLET | Freq: Two times a day (BID) | ORAL | 11 refills | Status: AC

## 2022-09-13 NOTE — Progress Notes (Signed)
  Progress Note    09/13/2022 9:00 AM 1 Day Post-Op  Subjective: Foot feels better. smiling    Vitals:   09/13/22 0749 09/13/22 0752  BP:    Pulse:    Resp:    Temp:    SpO2: 94% 94%    Physical Exam: General:  no distress Cardiac:  regular Lungs:  non labored Extremities:  palpable DP right foot.  Soft left groin   CBC    Component Value Date/Time   WBC 7.8 09/12/2022 0957   RBC 3.82 (L) 09/12/2022 0957   HGB 12.5 (L) 09/12/2022 0957   HGB 11.0 (L) 12/05/2020 1407   HCT 37.8 (L) 09/12/2022 0957   HCT 35.5 (L) 12/05/2020 1407   PLT 164 09/12/2022 0957   PLT 254 12/05/2020 1407   MCV 99.0 09/12/2022 0957   MCV 85 12/05/2020 1407   MCH 32.7 09/12/2022 0957   MCHC 33.1 09/12/2022 0957   RDW 13.1 09/12/2022 0957   RDW 20.4 (H) 12/05/2020 1407   LYMPHSABS 2.0 09/10/2022 1807   MONOABS 1.1 (H) 09/10/2022 1807   EOSABS 0.1 09/10/2022 1807   BASOSABS 0.1 09/10/2022 1807    BMET    Component Value Date/Time   NA 140 09/12/2022 0957   NA 139 12/05/2020 1407   K 4.0 09/12/2022 0957   CL 110 09/12/2022 0957   CO2 20 (L) 09/12/2022 0957   GLUCOSE 116 (H) 09/12/2022 0957   BUN 11 09/12/2022 0957   BUN 25 12/05/2020 1407   CREATININE 1.05 09/12/2022 0957   CREATININE 1.51 (H) 08/31/2020 1046   CALCIUM 8.4 (L) 09/12/2022 0957   GFRNONAA >60 09/12/2022 0957   GFRNONAA 46 (L) 08/31/2020 1046   GFRAA 52 (L) 01/19/2020 0547    INR    Component Value Date/Time   INR 1.5 (H) 09/10/2022 1807     Intake/Output Summary (Last 24 hours) at 09/13/2022 0900 Last data filed at 09/13/2022 0600 Gross per 24 hour  Intake 239.31 ml  Output 600 ml  Net -360.69 ml       Assessment/Plan:  82 y.o. male is s/p:  Lytic catheter placement extending from from the right femoral to below-knee popliteal artery bypass into the tibioperoneal trunk   1 Day Post-Op  Palpable PT pulse in the foot.  Home today pending PT clerance Eliquis/ plavix one month follow up   Cassandria Santee, MD [facility] Phone Number: [phone] 09/13/2022 9:00 AM

## 2022-09-13 NOTE — Discharge Instructions (Signed)
  [facility] [place]  Discharge Instructions  Lower Extremity Angiogram; Angioplasty/Stenting  Please refer to the following instructions for your post-procedure care. Your surgeon or physician assistant will discuss any changes with you.  Activity  Avoid lifting more than 8 pounds (1 gallons of milk) for 5 days after your procedure. You may walk as much as you can tolerate. It's OK to drive after 72 hours.  Bathing/Showering  You may shower the day after your procedure. If you have a bandage, you may remove it at 24- 48 hours. Clean your incision site with mild soap and water. Pat the area dry with a clean towel.  Diet  Resume your pre-procedure diet. There are no special food restrictions following this procedure. All patients with peripheral vascular disease should follow a low fat/low cholesterol diet. In order to heal from your surgery, it is CRITICAL to get adequate nutrition. Your body requires vitamins, minerals, and protein. Vegetables are the best source of vitamins and minerals. Vegetables also provide the perfect balance of protein. Processed food has little nutritional value, so try to avoid this.  Medications  Resume taking all of your medications unless your doctor tells you not to. If your incision is causing pain, you may take over-the-counter pain relievers such as acetaminophen (Tylenol)  Follow Up  Follow up will be arranged at the time of your procedure. You may have an office visit scheduled or may be scheduled for surgery. Ask your surgeon if you have any questions.  Please call us immediately for any of the following conditions: .Severe or worsening pain your legs or feet at rest or with walking. .Increased pain, redness, drainage at your groin puncture site. .Fever of 101 degrees or higher. .If you have any mild or slow bleeding from your puncture site: lie down, apply firm constant pressure over the area with a piece of gauze or a  clean wash cloth for 30 minutes- no peeking!, call 911 right away if you are still bleeding after 30 minutes, or if the bleeding is heavy and unmanageable.  Reduce your risk factors of vascular disease:  . Stop smoking. If you would like help call [facility] at 1-800-QUIT-NOW (1-800-784-8669) or [place] at 336-586-4000. . Manage your cholesterol . Maintain a desired weight . Control your diabetes . Keep your blood pressure down .  If you have any questions, please call the office at [phone] 

## 2022-09-13 NOTE — Plan of Care (Signed)
New prescription dose for eliquis reviewed with pt and daughter.  Pt current prescription is for eliquis 2.5mg .  Reviewed with pt and daughter re:  dose change.   Take 2 eliquis tabs twice daily until new prescription filled at [facility].  Pharmacy at [facility] closed over weekend.  Pt and pt's daughter verbalize understanding.

## 2022-09-13 NOTE — Evaluation (Signed)
Physical Therapy Evaluation and Discharge Patient Details Name: Donald Gordon MRN: [id number] DOB: 09-Sep-1940 Today's Date: 09/13/2022  History of Present Illness  Pt is a 82 y.o. M who presents 09/09/2021 with complaints of pain in RLE. Found to have critical ischemia of RLE. Now s/p Lytic catheter placement extending from R femoral to below knee popliteal artery bypass into the tibioperoneal trunk 3/21 and follow up catheter directed thrombolysis with cessation of thrombolysis and RLE angiogram 3/22. Significant PMH: PVD, right femoropopliteal bypass, CAD, HTN, GERD, PAF, DM2, COPD.  Clinical Impression  PTA, pt lives alone in a mobile home with 3 steps to enter and is independent. Pt overall is mobilizing well post op. Pt ambulating 370 ft with a walker at a supervision level. Demonstrates improved stability/independence with RW in comparison to with no AD. Education reviewed regarding appropriate DME, exercise/activity recommendations and progression. Pt reports family can provide assist with IADL's as needed. No further acute PT needs. Thank you for this consult.     Recommendations for follow up therapy are one component of a multi-disciplinary discharge planning process, led by the attending physician.  Recommendations may be updated based on patient status, additional functional criteria and insurance authorization.  Follow Up Recommendations No PT follow up      Assistance Recommended at Discharge PRN  Patient can return home with the following  Assistance with cooking/housework;Assist for transportation;Help with stairs or ramp for entrance    Equipment Recommendations Rolling walker (2 wheels)  Recommendations for Other Services       Functional Status Assessment Patient has had a recent decline in their functional status and demonstrates the ability to make significant improvements in function in a reasonable and predictable amount of time.     Precautions / Restrictions  Precautions Precautions: Fall Restrictions Weight Bearing Restrictions: No      Mobility  Bed Mobility Overal bed mobility: Needs Assistance Bed Mobility: Supine to Sit     Supine to sit: Min assist     General bed mobility comments: Pt seeking HHA to sit up    Transfers Overall transfer level: Needs assistance Equipment used: Rolling walker (2 wheels), None Transfers: Sit to/from Stand Sit to Stand: Supervision, Min guard                Ambulation/Gait Ambulation/Gait assistance: Supervision, Min guard Gait Distance (Feet): 370 Feet Assistive device: None, Rolling walker (2 wheels) Gait Pattern/deviations: Step-through pattern, Decreased stride length       General Gait Details: Good gait speed, min cues for walker use and proximity. Min guard with no AD, progressing to supervision with RW  Stairs            Wheelchair Mobility    Modified Rankin (Stroke Patients Only)       Balance Overall balance assessment: Mild deficits observed, not formally tested                                           Pertinent Vitals/Pain Pain Assessment Pain Assessment: Faces Faces Pain Scale: Hurts a little bit Pain Location: R calf, BLE's Pain Descriptors / Indicators: Guarding, Sore Pain Intervention(s): Monitored during session    Home Living Family/patient expects to be discharged to:: Private residence Living Arrangements: Alone Available Help at Discharge: Family;Available PRN/intermittently Type of Home: Mobile home Home Access: Stairs to enter Entrance Stairs-Rails: Right;Left Entrance Stairs-Number of Steps:  3   Home Layout: One level Home Equipment: Rollator (4 wheels);Cane - single point;Wheelchair - manual      Prior Function Prior Level of Function : Independent/Modified Independent;Driving               ADLs Comments: Eats out mainly     Hand Dominance        Extremity/Trunk Assessment   Upper Extremity  Assessment Upper Extremity Assessment: Overall WFL for tasks assessed    Lower Extremity Assessment Lower Extremity Assessment: Overall WFL for tasks assessed    Cervical / Trunk Assessment Cervical / Trunk Assessment: Normal  Communication   Communication: No difficulties  Cognition Arousal/Alertness: Awake/alert Behavior During Therapy: WFL for tasks assessed/performed Overall Cognitive Status: Within Functional Limits for tasks assessed                                          General Comments      Exercises     Assessment/Plan    PT Assessment Patient does not need any further PT services  PT Problem List         PT Treatment Interventions      PT Goals (Current goals can be found in the Care Plan section)  Acute Rehab PT Goals Patient Stated Goal: go home PT Goal Formulation: All assessment and education complete, DC therapy    Frequency       Co-evaluation               AM-PAC PT "6 Clicks" Mobility  Outcome Measure Help needed turning from your back to your side while in a flat bed without using bedrails?: None Help needed moving from lying on your back to sitting on the side of a flat bed without using bedrails?: None Help needed moving to and from a bed to a chair (including a wheelchair)?: A Little Help needed standing up from a chair using your arms (e.g., wheelchair or bedside chair)?: A Little Help needed to walk in hospital room?: A Little Help needed climbing 3-5 steps with a railing? : A Little 6 Click Score: 20    End of Session Equipment Utilized During Treatment: Gait belt Activity Tolerance: Patient tolerated treatment well Patient left: with call bell/phone within reach;in chair;with chair alarm set Nurse Communication: Mobility status PT Visit Diagnosis: Difficulty in walking, not elsewhere classified (R26.2);Pain Pain - Right/Left: Right Pain - part of body: Leg    Time: 0826-0851 PT Time Calculation (min)  (ACUTE ONLY): 25 min   Charges:   PT Evaluation $PT Eval Low Complexity: 1 Low PT Treatments $Gait Training: 8-22 mins        Wyona Almas, PT, DPT Acute Rehabilitation Services Office [phone]   Deno Etienne 09/13/2022, 9:45 AM

## 2022-09-15 ENCOUNTER — Telehealth: Payer: Self-pay | Admitting: Physician Assistant

## 2022-09-15 NOTE — Discharge Summary (Signed)
Discharge Summary  Patient ID: Donald Gordon [id number] y.o. 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 Donald Gordon is appreciated waxing and waning numbness in the right leg for quite a few months, but 10 days ago noted acute pain and a numb sensation. He denied motor dysfunction. After discussing the risk and benefits of right lower extremity angiography in an effort to define, and hopefully improve perfusion, Donald Gordon elected to proceed. He is aware that this will likely require lytic catheter placement if the bypass was found to be occluded.   Hospital Course: Donald Gordon initially presented to the [facility] ED on 09/10/22 with 1 day history of severe right great toe pain with CTA showing occluded right lower extremity bypass graft so he was subsequently transferred [facility] for evaluation and management. He was initiated on IV heparin and scheduled for Angiogram with initiation of thrombolytic therapy. On 09/11/22 he underwent Aortogram, Cannulation of the right lower extremity with lytic catheter placement from the right femoral to below knee popliteal artery bypass and into the tibioperoneal trunk by Dr. Virl Cagey. He was taken to the ICU for overnight observation. He had some improvement of leg pain overnight. He was again taken back to OR for  follow up catheter directed thrombolysis with cessation of the thrombolysis, right lower extremity angiogram showing successful thrombolysis of right lower extremity bypass graft and patency of right off vessels. He tolerated the procedure well and was taken back to the ICU. He remained hemodynamically stable and had full resolution of rest pain in his right foot.   By POD#1 his right femoral to below knee popliteal bypass remained patent. Palpable right PT pulse in foot. Rest pain resolved. He tolerated PT and was cleared with no recommendation for PT follow up. He was continued on Aspirin, Statin and full dose Eliquis restarted. He remained stable for discharge home. He has follow up arranged in 1 month with ABI and RLE bypass graft duplex.   Consults: None  Treatments: analgesia: Dilaudid and Morphine, anticoagulation: heparin and Eliquis, and surgery: 3/21 1.  Ultrasound-guided micropuncture access of the right common femoral artery in retrograde fashion 2.  Aortogram 3.  Second-order cannulation, right lower extremity angiogram 4.  Lytic catheter placement extending from from the right femoral to below-knee popliteal artery bypass into the tibioperoneal trunk 5.  Moderate sedation time 31 minutes, contrast volume 70 mL 3/22 1) Follow up catheter directed thrombolysis with cessation of thrombolysis 2) Right lower extremity angiogram with third order cannulation (62mL total contrast) 3) Conscious sedation (11 minutes)   Disposition: Discharge disposition: 01-Home or Self Care       Patient Instructions:  Allergies as of 09/13/2022       Reactions   Oxycodone Nausea And Vomiting   Statins Other (See Comments)   Muscle and Bone pain   Amiodarone    Caused issues and was stopped by MD- possible was the cause for the patient's heart "going  in and out of rhythm" multiple times   Donepezil Other (See Comments)   Per [facility] Insomnia   Imipramine Other (See Comments)   Per [facility]    Oxybutynin Chloride Swelling, Other (See Comments)   Per [facility]        Medication List     STOP taking these medications    atorvastatin 40 MG tablet Commonly known as: LIPITOR   Tiotropium Bromide-Olodaterol 2.5-2.5 MCG/ACT Aers       TAKE these medications    acetaminophen 500 MG tablet Commonly known as: TYLENOL Take 500 mg by mouth 4 (four) times daily as needed for moderate pain or headache.   albuterol 108 (90 Base) MCG/ACT inhaler Commonly known as: VENTOLIN HFA Inhale 1 puff into the lungs every 6 (six) hours as needed for shortness of breath.   apixaban 5 MG Tabs tablet Commonly known as: Eliquis Take 1 tablet (5 mg total) by mouth 2 (two) times daily. What changed:  medication strength how much to take   clopidogrel 75 MG tablet Commonly known as: PLAVIX Take 1 tablet by mouth daily.   ezetimibe 10 MG tablet Commonly known as: ZETIA Take 10 mg by mouth every evening.   ferrous sulfate 325 (65 FE) MG tablet Take 325 mg by mouth daily with breakfast.   hydrochlorothiazide 12.5 MG capsule Commonly known as: MICROZIDE Take 12.5 mg by mouth daily.   memantine 10 MG tablet Commonly known as: NAMENDA Take 20 mg by mouth at bedtime.   metoprolol tartrate 50 MG tablet Commonly known as: LOPRESSOR Take 50 mg by mouth 2 (two) times daily.   nitroGLYCERIN 0.4 MG SL tablet Commonly known as: NITROSTAT Place 0.4 mg under the tongue every 5 (five) minutes x 3 doses as needed for chest pain.   pantoprazole 40 MG tablet Commonly known as: PROTONIX Take 40 mg by mouth daily before breakfast.   ramipril 10 MG capsule Commonly known as: ALTACE Take 10 mg by mouth 2 (two) times daily.   rivastigmine 6 MG capsule Commonly known as: EXELON Take 6 mg by mouth 2 (two) times daily.   vitamin B-12 500 MCG tablet Commonly known as: CYANOCOBALAMIN Take 500 mcg by mouth daily.   Vitamin D 50 MCG (2000 UT) tablet Take 2,000 Units by mouth daily.                Discharge Care Instructions  (From admission, onward)           Start     Ordered   09/13/22 0000  Discharge wound care:       Comments: You may shower and wash groin with mild soap and water, pat dry. Do not soak in bathtub   09/13/22 0908           Activity: activity as tolerated, no driving while on analgesics, and no heavy lifting for 2 weeks Diet: cardiac diet and low fat, low cholesterol diet Wound Care:  you can shower as normal. Do not soak in bathtub  Follow-up with [facility] in 1 month.  SignedKaroline Caldwell, PA-C 09/15/2022 8:26 AM [facility] Office: [phone]

## 2022-09-15 NOTE — Telephone Encounter (Signed)
-----   Message from Karoline Caldwell, [place] sent at 09/13/2022  7:37 AM EDT ----- S/p thrombolysis of right lower extremity bypass by Dr. [name]. He is a Dr. Carlis Abbott patient. Please arrange follow up in 1 month with right lower extremity bypass graft duplex and ABIs on  day Dr. Carlis Abbott in office. thanks

## 2022-10-13 ENCOUNTER — Other Ambulatory Visit: Payer: Self-pay | Admitting: *Deleted

## 2022-10-13 DIAGNOSIS — I70229 Atherosclerosis of native arteries of extremities with rest pain, unspecified extremity: Secondary | ICD-10-CM

## 2022-10-13 DIAGNOSIS — I739 Peripheral vascular disease, unspecified: Secondary | ICD-10-CM

## 2022-10-21 ENCOUNTER — Ambulatory Visit (HOSPITAL_COMMUNITY): Payer: No Typology Code available for payment source

## 2022-10-21 ENCOUNTER — Ambulatory Visit (HOSPITAL_COMMUNITY): Payer: No Typology Code available for payment source | Attending: Vascular Surgery

## 2023-02-02 DIAGNOSIS — S32592A Other specified fracture of left pubis, initial encounter for closed fracture: Secondary | ICD-10-CM | POA: Diagnosis not present

## 2023-02-02 DIAGNOSIS — M8080XA Other osteoporosis with current pathological fracture, unspecified site, initial encounter for fracture: Secondary | ICD-10-CM | POA: Diagnosis not present

## 2023-02-02 DIAGNOSIS — Z7189 Other specified counseling: Secondary | ICD-10-CM | POA: Diagnosis not present

## 2023-02-09 DIAGNOSIS — M808B2A Other osteoporosis with current pathological fracture, left pelvis, initial encounter for fracture: Secondary | ICD-10-CM | POA: Diagnosis not present

## 2023-02-09 DIAGNOSIS — M8080XA Other osteoporosis with current pathological fracture, unspecified site, initial encounter for fracture: Secondary | ICD-10-CM | POA: Diagnosis not present

## 2023-02-09 DIAGNOSIS — M810 Age-related osteoporosis without current pathological fracture: Secondary | ICD-10-CM | POA: Diagnosis not present

## 2023-08-15 DIAGNOSIS — M7989 Other specified soft tissue disorders: Secondary | ICD-10-CM | POA: Diagnosis not present

## 2023-08-18 ENCOUNTER — Telehealth: Payer: Self-pay | Admitting: Radiation Oncology

## 2023-08-18 ENCOUNTER — Encounter: Payer: Self-pay | Admitting: Radiation Oncology

## 2023-08-18 NOTE — Progress Notes (Signed)
 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   Patient identity verified x2.  Patient reports dorsalgia 8/10 in am, during cold weather and is recovering from shingles. Patient denies any related issues at this time.  Meaningful use complete.  Vitals- BP (!) 147/64 (BP Location: Left Arm, Patient Position: Sitting, Cuff Size: Normal)   Pulse 68   Temp 97.8 F (36.6 C) (Temporal)   Resp 20   Ht 5\' 9"  (1.753 m)   Wt 187 lb 3.2 oz (84.9 kg)   SpO2 96%   BMI 27.64 kg/m   This concludes the interaction.  Ruel Favors, LPN

## 2023-08-18 NOTE — Telephone Encounter (Addendum)
 2/25 Patient left voicemail after missed call from our office.  No one from our office called patient.  Follow up call to patient no answer Left voicemail to confirm appointment for tomorrow.

## 2023-08-19 ENCOUNTER — Ambulatory Visit
Admission: RE | Admit: 2023-08-19 | Discharge: 2023-08-19 | Disposition: A | Discharge status: Home / Self Care | Payer: No Typology Code available for payment source | Source: Ambulatory Visit | Attending: Radiation Oncology | Admitting: Radiation Oncology

## 2023-08-19 ENCOUNTER — Encounter: Payer: Self-pay | Admitting: Radiation Oncology

## 2023-08-19 VITALS — BP 147/64 | HR 68 | Temp 97.8°F | Resp 20 | Ht 69.0 in | Wt 187.2 lb

## 2023-08-19 DIAGNOSIS — M129 Arthropathy, unspecified: Secondary | ICD-10-CM | POA: Diagnosis not present

## 2023-08-19 DIAGNOSIS — E1151 Type 2 diabetes mellitus with diabetic peripheral angiopathy without gangrene: Secondary | ICD-10-CM | POA: Diagnosis not present

## 2023-08-19 DIAGNOSIS — R918 Other nonspecific abnormal finding of lung field: Secondary | ICD-10-CM | POA: Insufficient documentation

## 2023-08-19 DIAGNOSIS — Z86718 Personal history of other venous thrombosis and embolism: Secondary | ICD-10-CM | POA: Insufficient documentation

## 2023-08-19 DIAGNOSIS — I252 Old myocardial infarction: Secondary | ICD-10-CM | POA: Diagnosis not present

## 2023-08-19 DIAGNOSIS — Z87891 Personal history of nicotine dependence: Secondary | ICD-10-CM | POA: Diagnosis not present

## 2023-08-19 DIAGNOSIS — C3431 Malignant neoplasm of lower lobe, right bronchus or lung: Secondary | ICD-10-CM | POA: Insufficient documentation

## 2023-08-19 DIAGNOSIS — I10 Essential (primary) hypertension: Secondary | ICD-10-CM | POA: Diagnosis not present

## 2023-08-19 DIAGNOSIS — Z79899 Other long term (current) drug therapy: Secondary | ICD-10-CM | POA: Insufficient documentation

## 2023-08-19 DIAGNOSIS — E785 Hyperlipidemia, unspecified: Secondary | ICD-10-CM | POA: Insufficient documentation

## 2023-08-19 DIAGNOSIS — Z7901 Long term (current) use of anticoagulants: Secondary | ICD-10-CM | POA: Diagnosis not present

## 2023-08-19 DIAGNOSIS — G4730 Sleep apnea, unspecified: Secondary | ICD-10-CM | POA: Diagnosis not present

## 2023-08-19 DIAGNOSIS — I2510 Atherosclerotic heart disease of native coronary artery without angina pectoris: Secondary | ICD-10-CM | POA: Diagnosis not present

## 2023-08-19 DIAGNOSIS — K219 Gastro-esophageal reflux disease without esophagitis: Secondary | ICD-10-CM | POA: Diagnosis not present

## 2023-08-19 DIAGNOSIS — K621 Rectal polyp: Secondary | ICD-10-CM | POA: Insufficient documentation

## 2023-08-19 DIAGNOSIS — I480 Paroxysmal atrial fibrillation: Secondary | ICD-10-CM | POA: Diagnosis not present

## 2023-08-19 DIAGNOSIS — Z7902 Long term (current) use of antithrombotics/antiplatelets: Secondary | ICD-10-CM | POA: Diagnosis not present

## 2023-08-19 DIAGNOSIS — Z8673 Personal history of transient ischemic attack (TIA), and cerebral infarction without residual deficits: Secondary | ICD-10-CM | POA: Insufficient documentation

## 2023-08-19 NOTE — Progress Notes (Addendum)
 Radiation Oncology         [phone] ________________________________  Re-Consultation    Name: Donald Gordon        MRN: [id number]  Date of Service: 08/19/2023 DOB: Nov 09, 1940  WU[id number], [facility]  [place], [place] Calix, MD     REFERRING PHYSICIAN: Martie Lee, MD   DIAGNOSIS: The encounter diagnosis was Malignant neoplasm of lower lobe of right lung (HCC).   HISTORY OF PRESENT ILLNESS: Donald Gordon is a 83 y.o. male originally seen at the request of Dr. Westley Hummer in 2021 for a what was felt to be putative stage I lung cancer in the right lower lobe. The patient has a history of COPD, and CXR in September 2021  showed a possible RLL nodule. A CT scan on 02/22/2020  showed the area in the right lower lobe measuring 10 x 8 x 8 mm, along the paraspinal region posterior to the posterior pulmonary veins. No evidence of adenopathy was identified. Pulmonary medicine met with the patient at the [place], Dr. Westley Hummer recommended following this area as well as the possibility of meeting with [place] to discuss putative stereotactic body radiotherapy. He was not felt to be a good candidate for bronchoscopy or CT-guided biopsy. A PET scan in October 2021 showed low level metabolic activity suspicious for reactive changes but he did have intense uptake in the anorectal region. A colonoscopy in July 2021 at [facility] in [facility] showed cecal, proximal ascending, proximal descending, and proximal sigmoid tubular adenomas. There was a lot of conversation with Dr. Conley Rolls in GI in [facility] about the rectal polyps needed to be further worked up but in the midst of this his cardiovascular conditions worsened and we were going to get him set up to see Dr. Maisie Fus in [facility] but she recommended he go to a tertiary medical center for care. It does not appear he's had any follow up.  Regarding his lung nodule, we elected to follow this site.  We could not reach the patient on multiple occasions but he was finally able to  have a repeat scan that was performed on 08/31/20 that showed persistence of this RLL nodule measuring 11 mm in greatest dimension. No adenopathy was present. persistent cardiovascular atherosclerosis was again seen. He also had a stable 2 mm LLL nodule, cholelithiasis and evidence of emphysema. A repeat CT scan of the chest with contrast on 11/21/2020 at the [facility], it showed a 1 x 0.9 x 1.2 cm right lower lobe nodule, while the [place] comparisons had indicated there was a slight change, his scans in March of 2022 again measured the largest dimension at 11 mm.  No adenopathy was identified, no new nodules were mentioned.     Around this time he was having ablative treatment with cardiology for A. Fib. While we had discussed that his cardiac issues were higher priority and  stereotactic body radiotherapy (SBRT) was offered as an option at that time, but it wasn't strongly felt that he needed to proceed. [facility]. A CT on 04/03/22 measured this as 12 mm. Another CT on 06/22/24 showed the nodule in the RLL measuring 14 mm with a focus of air within. Moderate emphysema was noted, and stable subcentimeter nodules were noted in the RUL. He's seen again today to discuss considering radiation treatment now.   PREVIOUS RADIATION THERAPY: No   PAST MEDICAL HISTORY:  Past Medical History:  Diagnosis Date  Arthritis    "knees, elbows" (03/18/2018)   Chronic edema    a. Chronic RLE edema.   COPD (chronic obstructive pulmonary disease) (HCC)    Coronary artery disease    a. MI s/p balloon 1996, details unclear.   Dysrhythmia    PROXIMAL MARGIN FIBRILATION   GERD (gastroesophageal reflux disease)    Hyperlipidemia    Hypertension    Myocardial infarction (HCC) 1996   PAD (peripheral artery disease) (HCC)    a. s/p stenting 08/2012, 02/2013.    PAF (paroxysmal atrial fibrillation) (HCC)    Sleep apnea    "have mask; have to start wearing it" (03/18/2018)   Stroke [facility]) ~  2014   denies residual on 03/18/2018   Thrombosis of lower extremity    a. Listed on patient's medical bracelet - at [place].   Type II diabetes mellitus (HCC)        PAST SURGICAL HISTORY: Past Surgical History:  Procedure Laterality Date   ABDOMINAL AORTOGRAM W/LOWER EXTREMITY N/A 07/25/2019   Procedure: ABDOMINAL AORTOGRAM W/LOWER EXTREMITY;  Surgeon: Maeola Harman, MD;  Location: [facility] INVASIVE CV LAB;  Service: Cardiovascular;  Laterality: N/A;   ANGIOPLASTY Right 06/03/2020   Procedure: BALLOON ANGIOPLASTY POPITEAL ARTERY;  Surgeon: Leonie Douglas, MD;  Location: [facility] OR;  Service: Vascular;  Laterality: Right;   ANTERIOR LUMBAR FUSION  1981   APPENDECTOMY     APPLICATION OF WOUND VAC Right 01/09/2018   Procedure: APPLICATION OF WOUND VAC ON RIGHT LOWER LEG;  Surgeon: Fransisco Hertz, MD;  Location: [facility] OR;  Service: Vascular;  Laterality: Right;   ATRIAL FIBRILLATION ABLATION N/A 12/07/2020   Procedure: ATRIAL FIBRILLATION ABLATION;  Surgeon: Regan Lemming, MD;  Location: [facility] INVASIVE CV LAB;  Service: Cardiovascular;  Laterality: N/A;   BACK SURGERY     BUBBLE STUDY  12/07/2020   Procedure: BUBBLE STUDY;  Surgeon: Chrystie Nose, MD;  Location: [facility] ENDOSCOPY;  Service: Cardiovascular;;   CATARACT EXTRACTION W/ INTRAOCULAR LENS  IMPLANT, BILATERAL Bilateral    CORONARY ANGIOPLASTY WITH STENT PLACEMENT     "I've got 1 stent in there" (03/18/2018)   FASCIOTOMY Right 01/09/2018   Procedure: FASCIOTOMY RIGHT LOWER LEG;  Surgeon: Fransisco Hertz, MD;  Location: [facility] OR;  Service: Vascular;  Laterality: Right;   FASCIOTOMY Right 06/04/2020   Procedure: RIGHT LOWER EXTREMITY FASCIOTOMY;  Surgeon: Sherren Kerns, MD;  Location: [facility] OR;  Service: Vascular;  Laterality: Right;   FASCIOTOMY CLOSURE Right 01/11/2018   Procedure: FASCIOTOMY CLOSURE RIGHT CALF;  Surgeon: Fransisco Hertz, MD;  Location: [facility] OR;  Service: Vascular;  Laterality: Right;   FEMORAL-POPLITEAL BYPASS GRAFT Right  01/09/2018   Procedure: RIGHT FEMORAL-POPLITEAL ARTERY BYPASS USING PROPATEN X 80CM VASCULAR GRAFT;  Surgeon: Fransisco Hertz, MD;  Location: [facility] OR;  Service: Vascular;  Laterality: Right;   HEMORRHOID SURGERY     LEFT HEART CATH AND CORONARY ANGIOGRAPHY N/A 03/19/2018   Procedure: LEFT HEART CATH AND CORONARY ANGIOGRAPHY;  Surgeon: Swaziland, Peter M, MD;  [facility] INVAS[facility] CV LAB;  Service: Cardiovascular;  Laterality: N/A;   LOWER EXTREMITY ANGIOGRAM Right 06/02/2020   Procedure: RIGHT LOWER EXTREMITY ANGIOGRAM INTERVENTION;  Surgeon: Leonie Douglas, MD;  Location[facility] OR;  Service: Vascular;  Laterality: Right;   LOWER EXTREMITY ANGIOGRAM Right 06/03/2020   Procedure: RIGHT LOWER EXTREMITY ANGIOGRAM from FEMORAL APPROACH;  Surgeon: Leonie Douglas, MD;  Location: [facility] OR;  Service: Vascular;  Laterality: Right;   LOWER EXTREMITY ANGIOGRAPHY  N/A 08/31/2019   Procedure: LOWER EXTREMITY ANGIOGRAPHY;  Surgeon: Cephus Shelling, MD;  Location: [facility] INVASIVE CV LAB;  Service: Cardiovascular;  Laterality: N/A;   LOWER EXTREMITY ANGIOGRAPHY Right 01/18/2020   Procedure: LOWER EXTREMITY ANGIOGRAPHY;  Surgeon: Cephus Shelling, MD;  [facility] INV[facility] LAB;  Service: Cardiovascular;  Laterality: Right;   LOWER EXTREMITY ANGIOGRAPHY N/A 09/11/2022   Procedure: Lower Extremity Angiography;  Surgeon: Victorino Sparrow, MD;  Location: [facility] INVASIVE CV LAB;  Service: Cardiovascular;  Laterality: N/A;  With thrombolysis catheter insertion   PATCH ANGIOPLASTY Right 01/09/2018   Procedure: PATCH ANGIOPLASTY USING Livia Snellen BIOLOGIC 1CM X 6CM PATCH;  Surgeon: Fransisco Hertz, MD;  Location: [facility] OR;  Service: Vascular;  Laterality: Right;   PERIPHERAL VASCULAR ATHERECTOMY Right 01/19/2020   Procedure: PERIPHERAL VASCULAR ATHERECTOMY;  Surgeon: Cephus Shelling, MD;  [facility] INVAS[facility] CV LAB;  Service: Cardiovascular;  Laterality: Right;  Femoral popliteal and tibioperoneal arteries.   PERIPHERAL VASCULAR  BALLOON ANGIOPLASTY Right 09/01/2019   Procedure: PERIPHERAL VASCULAR BALLOON ANGIOPLASTY;  Surgeon: Maeola Harman, MD;  Location: [facility] INVASIVE CV LAB;  Service: Cardiovascular;  Laterality: Right;  peroneal artery.   PERIPHERAL VASCULAR INTERVENTION Right 07/25/2019   Procedure: PERIPHERAL VASCULAR INTERVENTION;  Surgeon: Maeola Harman, MD;  Location: [facility] INVASIVE CV LAB;  Service: Cardiovascular;  Laterality: Right;  SFA X 3   PERIPHERAL VASCULAR INTERVENTION Right 09/01/2019   Procedure: PERIPHERAL VASCULAR INTERVENTION;  Surgeon: Maeola Harman, MD;  Location: [facility] INVASIVE CV LAB;  Service: Cardiovascular;  Laterality: Right;  superficial femoral   PERIPHERAL VASCULAR THROMBECTOMY Right 09/01/2019   Procedure: PERIPHERAL VASCULAR THROMBECTOMY;  Surgeon: Maeola Harman, MD;  Location: [facility] INVASIVE CV LAB;  Service: Cardiovascular;  Laterality: Right;   PERIPHERAL VASCULAR THROMBECTOMY Right 01/18/2020   Procedure: PERIPHERAL VASCULAR THROMBECTOMY;  Surgeon: Cephus Shelling, MD;  [facility] INVASIVE CV LAB;  Service: Cardiovascular;  Laterality: Right;   PERIPHERAL VASCULAR THROMBECTOMY N/A 01/19/2020   Procedure: LYSIS RECHECK;  Surgeon: Cephus Shelling, MD;  [facility] INVASIVE CV LAB;  Service: Cardiovascular;  Laterality: N/A;   PERIPHERAL VASCULAR THROMBECTOMY N/A 09/12/2022   Procedure: LYSIS RECHECK;  Surgeon: Leonie Douglas, MD;  [facility] INVASIVE CV LAB;  Service: Cardiovascular;  Laterality: N/A;   POSTERIOR LUMBAR FUSION  1978   TEE WITHOUT CARDIOVERSION N/A 12/07/2020   Procedure: TRANSESOPHAGEAL ECHOCARDIOGRAM (TEE);  Surgeon: Chrystie Nose, MD;  Location: [facility] ENDOSCOPY;  Service: Cardiovascular;  Laterality: N/A;   THROMBECTOMY FEMORAL ARTERY Right 01/09/2018   Procedure: THROMBECTOMY RIGHT LOWER LEG;  Surgeon: Fransisco Hertz, MD;  Location: [facility] OR;  Service: Vascular;  Laterality: Right;   TONSILLECTOMY       FAMILY HISTORY:   Family History  Problem Relation Age of Onset   CAD Mother    CAD Brother    Hypertension Other      SOCIAL HISTORY:  reports that he quit smoking about 29 years ago. His smoking use included cigarettes. He started smoking about 59 years ago. He has a 90 pack-year smoking history. He has never used smokeless tobacco. He reports that he does not drink alcohol and does not use drugs. The patient is single and his daughter Marcelino Duster helps with his care. He lives in [place].    ALLERGIES: Oxycodone, Statins, Amiodarone, Donepezil, Imipramine, and Oxybutynin chloride   MEDICATIONS:  Current Outpatient Medications  Medication Sig Dispense Refill   acetaminophen (TYLENOL) 500 MG tablet Take 500 mg by mouth  4 (four) times daily as needed for moderate pain or headache.     albuterol (VENTOLIN HFA) 108 (90 Base) MCG/ACT inhaler Inhale 1 puff into the lungs every 6 (six) hours as needed for shortness of breath.     apixaban (ELIQUIS) 5 MG TABS tablet Take 1 tablet (5 mg total) by mouth 2 (two) times daily. 60 tablet 11   Cholecalciferol (VITAMIN D) 50 MCG (2000 UT) tablet Take 2,000 Units by mouth daily.     clopidogrel (PLAVIX) 75 MG tablet Take 1 tablet by mouth daily.     ezetimibe (ZETIA) 10 MG tablet Take 10 mg by mouth every evening.      ferrous sulfate 325 (65 FE) MG tablet Take 325 mg by mouth daily with breakfast.     hydrochlorothiazide (MICROZIDE) 12.5 MG capsule Take 12.5 mg by mouth daily.     memantine (NAMENDA) 10 MG tablet Take 20 mg by mouth at bedtime.     metoprolol tartrate (LOPRESSOR) 50 MG tablet Take 50 mg by mouth 2 (two) times daily.     nitroGLYCERIN (NITROSTAT) 0.4 MG SL tablet Place 0.4 mg under the tongue every 5 (five) minutes x 3 doses as needed for chest pain.     pantoprazole (PROTONIX) 40 MG tablet Take 40 mg by mouth daily before breakfast.     ramipril (ALTACE) 10 MG capsule Take 10 mg by mouth 2 (two) times daily.     rivastigmine (EXELON) 6 MG capsule Take  6 mg by mouth 2 (two) times daily.     vitamin B-12 (CYANOCOBALAMIN) 500 MCG tablet Take 500 mcg by mouth daily.     No current facility-administered medications for this encounter.     REVIEW OF SYSTEMS: On review of systems, the patient is doing well overall. He denies any shortness of breath. He has had shingles recently and took antiviral therapy thorough last week.  He denies any rectal bleeding. No other complaints are verbalized.   PHYSICAL EXAM:  Wt Readings from Last 3 Encounters:  08/18/23 187 lb 3.2 oz (84.9 kg)  09/13/22 183 lb 6.8 oz (83.2 kg)  12/10/21 182 lb (82.6 kg)   Temp Readings from Last 3 Encounters:  08/18/23 97.8 F (36.6 C) (Temporal)  09/13/22 98.9 F (37.2 C) (Oral)  12/10/21 (!) 97.5 F (36.4 C) (Temporal)   BP Readings from Last 3 Encounters:  08/18/23 (!) 147/64  09/13/22 119/68  12/10/21 (!) 169/67   Pulse Readings from Last 3 Encounters:  08/18/23 68  09/13/22 76  12/10/21 (!) 51   In general this is a well appearing caucasian male in no acute distress. He's alert and oriented x4 and appropriate throughout the examination. Cardiopulmonary assessment is negative for acute distress and he exhibits normal effort.    ECOG = 1  0 - Asymptomatic (Fully active, able to carry on all predisease activities without restriction)  1 - Symptomatic but completely ambulatory (Restricted in physically strenuous activity but ambulatory and able to carry out work of a light or sedentary nature. For example, light housework, office work)  2 - Symptomatic, <50% in bed during the day (Ambulatory and capable of all self care but unable to carry out any work activities. Up and about more than 50% of waking hours)  3 - Symptomatic, >50% in bed, but not bedbound (Capable of only limited self-care, confined to bed or chair 50% or more of waking hours)  4 - Bedbound (Completely disabled. Cannot carry on any self-care. Totally confined to  bed or chair)  5 -  Death   Santiago Glad MM, Creech RH, Tormey DC, et al. [phone]). "Toxicity and response criteria of the [facility]". Am. Evlyn Clines. Oncol. 5 (6): 649-55    LABORATORY DATA:  Lab Results  Component Value Date   WBC 7.8 09/12/2022   HGB 12.5 (L) 09/12/2022   HCT 37.8 (L) 09/12/2022   MCV 99.0 09/12/2022   PLT 164 09/12/2022   Lab Results  Component Value Date   NA 140 09/12/2022   K 4.0 09/12/2022   CL 110 09/12/2022   CO2 20 (L) 09/12/2022   Lab Results  Component Value Date   ALT 21 09/11/2022   AST 15 09/11/2022   ALKPHOS 66 09/11/2022   BILITOT 0.6 09/11/2022      RADIOGRAPHY: No results found.      IMPRESSION/PLAN: 1. 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  While previously, the RLL nodule been quite stable that has waxed and waned to some degree it appears that his imaging suggests progressive change more concerning now for malignancy. Dr. Mitzi Hansen recommends a PET scan to rule out other disease, and to proceed with ablative radiotherapy.  We discussed the risks, benefits, short, and long term effects of radiotherapy, as well as the curative intent, and the patient is interested in proceeding. We discussed the delivery and logistics of radiotherapy and anticipates a course of up to 10 fractions of treatment, possibly less pending his PET scan results.   2. History of rectal polyps. We will follow up with the results of the PET scan and have encouraged him to follow up with GI for recommendations.    In a visit lasting 45 minutes, greater than 50% of the time was spent face to face discussing the patient's condition, in preparation for the discussion, and coordinating the patient's care.     Osker Mason, PAC

## 2023-08-20 ENCOUNTER — Telehealth: Payer: Self-pay | Admitting: *Deleted

## 2023-08-20 NOTE — Telephone Encounter (Signed)
 CALLED PATIENT TO INFORM OF PET SCAN FOR 08/21/23- ARRIVAL TIME- 2:45 PM @ [facility], PATIENT TO HAVE WATER ONLY- 6 HRS. PRIOR TO SCAN, LVM FOR A RETURN CALL

## 2023-08-21 ENCOUNTER — Encounter (HOSPITAL_COMMUNITY)
Admission: RE | Admit: 2023-08-21 | Discharge: 2023-08-21 | Disposition: A | Discharge status: Home / Self Care | Payer: No Typology Code available for payment source | Source: Ambulatory Visit | Attending: Radiation Oncology | Admitting: Radiation Oncology

## 2023-08-21 DIAGNOSIS — C3431 Malignant neoplasm of lower lobe, right bronchus or lung: Secondary | ICD-10-CM | POA: Diagnosis present

## 2023-08-21 LAB — GLUCOSE, CAPILLARY: Glucose-Capillary: 97 mg/dL (ref 70–99)

## 2023-08-21 MED ORDER — FLUDEOXYGLUCOSE F - 18 (FDG) INJECTION
9.3100 | Freq: Once | INTRAVENOUS | Status: AC
  Administered 2023-08-21: 9.31 via INTRAVENOUS

## 2023-08-31 ENCOUNTER — Telehealth: Payer: Self-pay | Admitting: Radiation Oncology

## 2023-08-31 DIAGNOSIS — R918 Other nonspecific abnormal finding of lung field: Secondary | ICD-10-CM

## 2023-08-31 NOTE — Telephone Encounter (Signed)
 I called and let the patient know his PET scan was negative for metabolic change in his RLL nodule. He did not have any hypermetabolic changes in the rectum either. Dr. Mitzi Hansen recommends following him in 6 months with repeat CT scan, and if stable, [facility] He prefers his scans at [facility].

## 2023-09-11 DIAGNOSIS — M810 Age-related osteoporosis without current pathological fracture: Secondary | ICD-10-CM | POA: Diagnosis not present

## 2023-09-11 DIAGNOSIS — Z7189 Other specified counseling: Secondary | ICD-10-CM | POA: Diagnosis not present

## 2023-10-19 ENCOUNTER — Telehealth: Payer: Self-pay

## 2023-10-19 NOTE — Telephone Encounter (Signed)
 Appt: -pt called stating he needs to make an appt that he was calling for 5 hours on Friday.  -returned call- LM to return call

## 2023-10-20 ENCOUNTER — Other Ambulatory Visit: Payer: Self-pay

## 2023-10-20 DIAGNOSIS — I739 Peripheral vascular disease, unspecified: Secondary | ICD-10-CM

## 2023-10-21 ENCOUNTER — Ambulatory Visit (HOSPITAL_COMMUNITY)
Admission: RE | Admit: 2023-10-21 | Discharge: 2023-10-21 | Disposition: A | Discharge status: Home / Self Care | Source: Ambulatory Visit | Attending: Vascular Surgery | Admitting: Vascular Surgery

## 2023-10-21 ENCOUNTER — Ambulatory Visit (HOSPITAL_COMMUNITY)
Admission: RE | Admit: 2023-10-21 | Discharge: 2023-10-21 | Discharge status: Home / Self Care | Source: Ambulatory Visit | Attending: Vascular Surgery

## 2023-10-21 DIAGNOSIS — I739 Peripheral vascular disease, unspecified: Secondary | ICD-10-CM

## 2023-10-21 LAB — VAS US ABI WITH/WO TBI
Left ABI: 0.67
Right ABI: 0.18

## 2023-10-22 ENCOUNTER — Ambulatory Visit: Attending: Vascular Surgery | Admitting: Physician Assistant

## 2023-10-22 ENCOUNTER — Other Ambulatory Visit: Payer: Self-pay

## 2023-10-22 VITALS — BP 169/74 | HR 60 | Temp 98.7°F | Wt 185.3 lb

## 2023-10-22 DIAGNOSIS — I739 Peripheral vascular disease, unspecified: Secondary | ICD-10-CM

## 2023-10-22 DIAGNOSIS — I70229 Atherosclerosis of native arteries of extremities with rest pain, unspecified extremity: Secondary | ICD-10-CM | POA: Diagnosis not present

## 2023-10-22 DIAGNOSIS — Z9889 Other specified postprocedural states: Secondary | ICD-10-CM | POA: Diagnosis not present

## 2023-10-22 NOTE — H&P (View-Only) (Signed)
 [facility] [place] HISTORY AND PHYSICAL   History of Present Illness:  Patient is a 83 y.o. year old male who presents for evaluation of  Occluded right LE bypass on 09/10/22.  S/P Lysis catheter directed for 2 days by Dr. Rosalva Comber followed by Dr. Sharia Daunt.  He had patent fem-pop bypass with PT/Peroneal inflow to the foot.  The AT was occluded.  He states that 3-4 months ago he started having calf pain on the right LE and his walking distance has progressively become shorter before he has to stop and rest.  He states a few weeks ago he now has coolness and pain in the right toes.  He denies non healing wounds. He has B LE edema without history of venous insufficiency or CHF.  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    Unknown previous vascular surgery at a different institution, does have right external iliac stents 01/09/2018: Right common femoral to below-knee popliteal artery bypass graft with PTFE, tibial thrombectomy and 4 compartment fasciotomy for ischemia [name]) 01/11/2018: Fasciotomy closure [name]) 07/25/2019: Restenting of previous PTFE bypass graft with stent to the right tibioperoneal trunk, popliteal artery, SFA with two 5x250 Viabahn distally and a 6 x 50 Viabahn proximally [name], rest pain) 08/31/2019: Initiation of thrombolytic therapy for rest pain and occluded stents [name]) 09/01/2019: Penumbra mechanical thrombectomy with stenting of the tibioperoneal trunk using a 5 x 60 Tiger us , balloon angioplasty of the peroneal artery with a 2.5 mm balloon and proximal bypass graft stenting into the common femoral artery with a 6 x 60 Eluvia   The patient has come off anticoagulation in the past which led to bypass graft occlusions.  He was last seen in our office in June 2023 with a moderate stenosis in his distal bypass graft with peak systolic velocity of 180.  His ABI on the right was 0.94.  He had a palpable posterior tibial pulse    Past Medical History:  Diagnosis Date    Arthritis    "knees, elbows" (03/18/2018)   Chronic edema    a. Chronic RLE edema.   COPD (chronic obstructive pulmonary disease) (HCC)    Coronary artery disease    a. MI s/p balloon 1996, details unclear.   Dysrhythmia    PROXIMAL MARGIN FIBRILATION   GERD (gastroesophageal reflux disease)    Hyperlipidemia    Hypertension    Myocardial infarction (HCC) 1996   PAD (peripheral artery disease) (HCC)    a. s/p stenting 08/2012, 02/2013.    PAF (paroxysmal atrial fibrillation) (HCC)    Sleep apnea    "have mask; have to start wearing it" (03/18/2018)   Stroke [facility]) ~ 2014   denies residual on 03/18/2018   Thrombosis of lower extremity    a. Listed on patient's medical bracelet - at [place].   Type II diabetes mellitus (HCC)     Past Surgical History:  Procedure Laterality Date   ABDOMINAL AORTOGRAM W/LOWER EXTREMITY N/A 07/25/2019   Procedure: ABDOMINAL AORTOGRAM W/LOWER EXTREMITY;  Surgeon: [name], MD;  Location: [facility] INVASIVE CV LAB;  Service: Cardiovascular;  Laterality: N/A;   ANGIOPLASTY Right 06/03/2020   Procedure: BALLOON ANGIOPLASTY POPITEAL ARTERY;  Surgeon: [name], MD;  Location: [facility] OR;  Service: Vascular;  Laterality: Right;   ANTERIOR LUMBAR FUSION  1981   APPENDECTOMY     APPLICATION OF WOUND VAC Right 01/09/2018   Procedure: APPLICATION OF WOUND VAC ON RIGHT LOWER LEG;  Surgeon: [name],  MD;  Location: [facility] OR;  Service: Vascular;  Laterality: Right;   ATRIAL FIBRILLATION ABLATION N/A 12/07/2020   Procedure: ATRIAL FIBRILLATION ABLATION;  Surgeon: [name], MD;  Location: [facility] INVASIVE CV LAB;  Service: Cardiovascular;  Laterality: N/A;   BACK SURGERY     BUBBLE STUDY  12/07/2020   Procedure: BUBBLE STUDY;  Surgeon: [name], MD;  Location: [facility] ENDOSCOPY;  Service: Cardiovascular;;   CATARACT EXTRACTION W/ INTRAOCULAR LENS  IMPLANT, BILATERAL Bilateral    CORONARY ANGIOPLASTY WITH STENT PLACEMENT     "I've got 1 stent in there"  (03/18/2018)   FASCIOTOMY Right 01/09/2018   Procedure: FASCIOTOMY RIGHT LOWER LEG;  Surgeon: [name], MD;  Location: [facility] OR;  Service: Vascular;  Laterality: Right;   FASCIOTOMY Right 06/04/2020   Procedure: RIGHT LOWER EXTREMITY FASCIOTOMY;  Surgeon: Richrd Char, MD;  Location: [facility] OR;  Service: Vascular;  Laterality: Right;   FASCIOTOMY CLOSURE Right 01/11/2018   Procedure: FASCIOTOMY CLOSURE RIGHT CALF;  Surgeon: [name], MD;  Location: [facility] OR;  Service: Vascular;  Laterality: Right;   FEMORAL-POPLITEAL BYPASS GRAFT Right 01/09/2018   Procedure: RIGHT FEMORAL-POPLITEAL ARTERY BYPASS USING PROPATEN X 80CM VASCULAR GRAFT;  Surgeon: [name], MD;  Location: [facility] OR;  Service: Vascular;  Laterality: Right;   HEMORRHOID SURGERY     LEFT HEART CATH AND CORONARY ANGIOGRAPHY N/A 03/19/2018   Procedure: LEFT HEART CATH AND CORONARY ANGIOGRAPHY;  Surgeon: Swaziland, Peter M, MD;  Location: [facility] INVASIVE CV LAB;  Service: Cardiovascular;  Laterality: N/A;   LOWER EXTREMITY ANGIOGRAM Right 06/02/2020   Procedure: RIGHT LOWER EXTREMITY ANGIOGRAM INTERVENTION;  Surgeon: [name], MD;  Location: [facility] OR;  Service: Vascular;  Laterality: Right;   LOWER EXTREMITY ANGIOGRAM Right 06/03/2020   Procedure: RIGHT LOWER EXTREMITY ANGIOGRAM from FEMORAL APPROACH;  Surgeon: [name], MD;  Location: [facility] OR;  Service: Vascular;  Laterality: Right;   LOWER EXTREMITY ANGIOGRAPHY N/A 08/31/2019   Procedure: LOWER EXTREMITY ANGIOGRAPHY;  Surgeon: Young Hensen, MD;  Location: [facility] INVASIVE CV LAB;  Service: Cardiovascular;  Laterality: N/A;   LOWER EXTREMITY ANGIOGRAPHY Right 01/18/2020   Procedure: LOWER EXTREMITY ANGIOGRAPHY;  Surgeon: Young Hensen, MD;  [facility]: [facility] INVASIVE CV LAB;  Service: Cardiovascular;  Laterality: Right;   LOWER EXTREMITY ANGIOGRAPHY N/A 09/11/2022   Procedure: Lower Extremity Angiography;  Surgeon: [name], MD;  Location: [facility] INVASIVE CV LAB;  Service:  Cardiovascular;  Laterality: N/A;  With thrombolysis catheter insertion   PATCH ANGIOPLASTY Right 01/09/2018   Procedure: PATCH ANGIOPLASTY USING Corinna Dickens BIOLOGIC 1CM X 6CM PATCH;  Surgeon: [name], MD;  Location: [facility] OR;  Service: Vascular;  Laterality: Right;   PERIPHERAL VASCULAR ATHERECTOMY Right 01/19/2020   Procedure: PERIPHERAL VASCULAR ATHERECTOMY;  Surgeon: Young Hensen, MD;  [facility]: [facility] INVAS[facility] CV LAB;  Service: Cardiovascular;  Laterality: Right;  Femoral popliteal and tibioperoneal arteries.   PERIPHERAL VASCULAR BALLOON ANGIOPLASTY Right 09/01/2019   Procedure: PERIPHERAL VASCULAR BALLOON ANGIOPLASTY;  Surgeon: [name], MD;  Location: [facility] INVASIVE CV LAB;  Service: Cardiovascular;  Laterality: Right;  peroneal artery.   PERIPHERAL VASCULAR INTERVENTION Right 07/25/2019   Procedure: PERIPHERAL VASCULAR INTERVENTION;  Surgeon: [name], MD;  Location: [facility] INVASIVE CV LAB;  Service: Cardiovascular;  Laterality: Right;  SFA X 3   PERIPHERAL VASCULAR INTERVENTION Right 09/01/2019   Procedure: PERIPHERAL VASCULAR INTERVENTION;  Surgeon: [name], MD;  Location: [facility] INVASIVE CV LAB;  Service:  Cardiovascular;  Laterality: Right;  superficial femoral   PERIPHERAL VASCULAR THROMBECTOMY Right 09/01/2019   Procedure: PERIPHERAL VASCULAR THROMBECTOMY;  Surgeon: [name], MD;  Location: [facility] INVASIVE CV LAB;  Service: Cardiovascular;  Laterality: Right;   PERIPHERAL VASCULAR THROMBECTOMY Right 01/18/2020   Procedure: PERIPHERAL VASCULAR THROMBECTOMY;  Surgeon: Young Hensen, MD;  [facility] INVASIVE CV LAB;  Service: Cardiovascular;  Laterality: Right;   PERIPHERAL VASCULAR THROMBECTOMY N/A 01/19/2020   Procedure: LYSIS RECHECK;  Surgeon: Young Hensen, MD;  Location: [facility] INVASIVE CV LAB;  Service: Cardiovascular;  Laterality: N/A;   PERIPHERAL VASCULAR THROMBECTOMY N/A 09/12/2022   Procedure: LYSIS RECHECK;   Surgeon: [name], MD;  Location: [facility] INVASIVE CV LAB;  Service: Cardiovascular;  Laterality: N/A;   POSTERIOR LUMBAR FUSION  1978   TEE WITHOUT CARDIOVERSION N/A 12/07/2020   Procedure: TRANSESOPHAGEAL ECHOCARDIOGRAM (TEE);  Surgeon: [name], MD;  Location: [facility] ENDOSCOPY;  Service: Cardiovascular;  Laterality: N/A;   THROMBECTOMY FEMORAL ARTERY Right 01/09/2018   Procedure: THROMBECTOMY RIGHT LOWER LEG;  Surgeon: [name], MD;  Location: [facility] OR;  Service: Vascular;  Laterality: Right;   TONSILLECTOMY      ROS:   General:  No weight loss, Fever, chills  HEENT: No recent headaches, no nasal bleeding, no visual changes, no sore throat  Neurologic: No dizziness, blackouts, seizures. No recent symptoms of stroke or mini- stroke. No recent episodes of slurred speech, or temporary blindness.  Cardiac: No recent episodes of chest pain/pressure, no shortness of breath at rest.  No shortness of breath with exertion.  Denies history of atrial fibrillation or irregular heartbeat  Vascular: No history of rest pain in feet.  Positive recent history of claudication.  No history of non-healing ulcer, No history of DVT   Pulmonary: No home oxygen, no productive cough, no hemoptysis,  No asthma or wheezing  Musculoskeletal:  [ ]  Arthritis, [ ]  Low back pain,  [ ]  Joint pain  Hematologic:No history of hypercoagulable state.  No history of easy bleeding.  No history of anemia  Gastrointestinal: No hematochezia or melena,  No gastroesophageal reflux, no trouble swallowing  Urinary: [ ]  chronic Kidney disease, [ ]  on HD - [ ]  MWF or [ ]  TTHS, [ ]  Burning with urination, [ ]  Frequent urination, [ ]  Difficulty urinating;   Skin: No rashes  Psychological: No history of anxiety,  No history of depression  Social History Social History   Tobacco Use   Smoking status: Former    Current packs/day: 0.00    Average packs/day: 3.0 packs/day for 30.0 years (90.0 ttl pk-yrs)    Types:  Cigarettes    Start date: 51    Quit date: 67    Years since quitting: 29.3   Smokeless tobacco: Never  Vaping Use   Vaping status: Never Used  Substance Use Topics   Alcohol use: No    Comment: Remote alcohol use   Drug use: Never    Family History Family History  Problem Relation Age of Onset   CAD Mother    CAD Brother    Hypertension Other     Allergies  Allergies  Allergen Reactions   Oxycodone  Nausea And Vomiting   Statins Other (See Comments)    Muscle and Bone pain    Amiodarone      Caused issues and was stopped by MD- possible was the cause for the patient's heart "going in and out of rhythm" multiple times   Donepezil Other (  See Comments)    Per [facility] Insomnia   Imipramine Other (See Comments)    Per [facility]   Oxybutynin Chloride Swelling and Other (See Comments)    Per [facility]     Current Outpatient Medications  Medication Sig Dispense Refill   acetaminophen  (TYLENOL ) 500 MG tablet Take 500 mg by mouth 4 (four) times daily as needed for moderate pain or headache.     albuterol  (VENTOLIN  HFA) 108 (90 Base) MCG/ACT inhaler Inhale 1 puff into the lungs every 6 (six) hours as needed for shortness of breath.     apixaban  (ELIQUIS ) 5 MG TABS tablet Take 1 tablet (5 mg total) by mouth 2 (two) times daily. 60 tablet 11   Cholecalciferol  (VITAMIN D) 50 MCG (2000 UT) tablet Take 2,000 Units by mouth daily.     clopidogrel  (PLAVIX ) 75 MG tablet Take 1 tablet by mouth daily.     ezetimibe  (ZETIA ) 10 MG tablet Take 10 mg by mouth every evening.      ferrous sulfate  325 (65 FE) MG tablet Take 325 mg by mouth daily with breakfast.     hydrochlorothiazide  (MICROZIDE ) 12.5 MG capsule Take 12.5 mg by mouth daily.     memantine  (NAMENDA ) 10 MG tablet Take 20 mg by mouth at bedtime.     metoprolol  tartrate (LOPRESSOR ) 50 MG tablet Take 50 mg by mouth 2 (two) times daily.     nitroGLYCERIN  (NITROSTAT ) 0.4 MG SL tablet Place 0.4 mg under the tongue every 5 (five) minutes x 3  doses as needed for chest pain.     pantoprazole  (PROTONIX ) 40 MG tablet Take 40 mg by mouth daily before breakfast.     ramipril  (ALTACE ) 10 MG capsule Take 10 mg by mouth 2 (two) times daily.     rivastigmine  (EXELON ) 6 MG capsule Take 6 mg by mouth 2 (two) times daily.     vitamin B-12 (CYANOCOBALAMIN ) 500 MCG tablet Take 500 mcg by mouth daily.     No current facility-administered medications for this visit.    Physical Examination  Vitals:   10/22/23 1227  BP: (!) 169/74  Pulse: 60  Temp: 98.7 F (37.1 C)  SpO2: 94%  Weight: 185 lb 4.8 oz (84.1 kg)    Body mass index is 27.36 kg/m.  General:  Alert and oriented, no acute distress HEENT: Normal Neck: No bruit or JVD Pulmonary: Clear to auscultation bilaterally Cardiac: Regular Rate and Rhythm without murmur Abdomen: Soft, non-tender, non-distended, no mass, no scars Skin: No rash, right LE well healed incisions Extremity Pulses:   radial,femoral pulses bilaterally non palpable pedal pulses Musculoskeletal:positive LE edema  Neurologic: Upper and lower extremity motor grossly intact and symmetric  DATA:     ABI Findings:  +--------+------------------+-----+-------------------+--------+  Right  Rt Pressure (mmHg)IndexWaveform           Comment   +--------+------------------+-----+-------------------+--------+  [id number]                                                +--------+------------------+-----+-------------------+--------+  PTA    33                0.18 dampened monophasic          +--------+------------------+-----+-------------------+--------+  DP  absent                       +--------+------------------+-----+-------------------+--------+   +---------+------------------+-----+----------+-------+  Left    Lt Pressure (mmHg)IndexWaveform  Comment  +---------+------------------+-----+----------+-------+  Brachial 187                                        +---------+------------------+-----+----------+-------+  PTA     126               0.67 monophasic         +---------+------------------+-----+----------+-------+  DP      110               0.59 monophasic         +---------+------------------+-----+----------+-------+  Great Toe100               0.53                    +---------+------------------+-----+----------+-------+   +-------+-----------+-----------+------------+------------+  ABI/TBIToday's ABIToday's TBIPrevious ABIPrevious TBI  +-------+-----------+-----------+------------+------------+  Right 0.18       absent     0.94        0.57          +-------+-----------+-----------+------------+------------+  Left  0.67       0.53       0.98        0.51          +-------+-----------+-----------+------------+------------+         Bilateral ABIs appear decreased compared to prior study on 12/10/2021.    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   Left: Resting left ankle-brachial index indicates moderate left lower  extremity arterial disease. The left toe-brachial index is abnormal.     +----------+--------+-----+--------+----------+--------+  RIGHT    PSV cm/sRatioStenosisWaveform  Comments  +----------+--------+-----+--------+----------+--------+  PTA Distal17                   monophasicdampened  +----------+--------+-----+--------+----------+--------+       Right Graft #1: fem-pop  +------------------+--------+--------+----------+--------------+                   PSV cm/sStenosisWaveform  Comments        +------------------+--------+--------+----------+--------------+  Inflow           120             monophasic                +------------------+--------+--------+----------+--------------+  Prox Anastomosis  48              monophasic                 +------------------+--------+--------+----------+--------------+  Proximal Graft    0                                         +------------------+--------+--------+----------+--------------+  Mid Graft         0                                         +------------------+--------+--------+----------+--------------+  Distal Graft  not visualized  +------------------+--------+--------+----------+--------------+  Distal Anastomosis                          not visualized  +------------------+--------+--------+----------+--------------+  Outflow                                    not visualized  +------------------+--------+--------+----------+--------------+    Summary:  Right: No color or spectral Doppler flow noted within the observed  segments of the femoral popliteal bypass graft   ASSESSMENT/ PLAN:  PAD s/p multiple right LE intervention.  He has an occluded right fem-pop bypass.  He states his claudication symptoms returned about 3-4 months ago and have gotten progressively worse.  He now has right foot toe pain for 2 weeks.  He denies non healing wounds.    The ABI and arterial duplex show an occluded bypass.  DR. Rosalva Comber reviewed the studies and spoke with him.. We have decided that he would benefit from repeat angiogram with right LE runoff and possible intervention.  He is at high risk of limb loss each time we intervene.  He understands this and wishes to proceed. We will schedule him for Wednesday 10/28/23 with Dr. Rosalva Comber.   Rocky Cipro PA-C [facility] [place] Office: [phone]  MD in clinic [place]

## 2023-10-22 NOTE — Progress Notes (Signed)
 [facility] [place] HISTORY AND PHYSICAL   History of Present Illness:  Patient is a 83 y.o. year old male who presents for evaluation of  Occluded right LE bypass on 09/10/22.  S/P Lysis catheter directed for 2 days by Dr. Rosalva Comber followed by Dr. Sharia Daunt.  He had patent fem-pop bypass with PT/Peroneal inflow to the foot.  The AT was occluded.  He states that 3-4 months ago he started having calf pain on the right LE and his walking distance has progressively become shorter before he has to stop and rest.  He states a few weeks ago he now has coolness and pain in the right toes.  He denies non healing wounds. He has B LE edema without history of venous insufficiency or CHF.  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    Unknown previous vascular surgery at a different institution, does have right external iliac stents 01/09/2018: Right common femoral to below-knee popliteal artery bypass graft with PTFE, tibial thrombectomy and 4 compartment fasciotomy for ischemia [name]) 01/11/2018: Fasciotomy closure [name]) 07/25/2019: Restenting of previous PTFE bypass graft with stent to the right tibioperoneal trunk, popliteal artery, SFA with two 5x250 Viabahn distally and a 6 x 50 Viabahn proximally [name], rest pain) 08/31/2019: Initiation of thrombolytic therapy for rest pain and occluded stents [name]) 09/01/2019: Penumbra mechanical thrombectomy with stenting of the tibioperoneal trunk using a 5 x 60 Tiger us , balloon angioplasty of the peroneal artery with a 2.5 mm balloon and proximal bypass graft stenting into the common femoral artery with a 6 x 60 Eluvia   The patient has come off anticoagulation in the past which led to bypass graft occlusions.  He was last seen in our office in June 2023 with a moderate stenosis in his distal bypass graft with peak systolic velocity of 180.  His ABI on the right was 0.94.  He had a palpable posterior tibial pulse    Past Medical History:  Diagnosis Date    Arthritis    "knees, elbows" (03/18/2018)   Chronic edema    a. Chronic RLE edema.   COPD (chronic obstructive pulmonary disease) (HCC)    Coronary artery disease    a. MI s/p balloon 1996, details unclear.   Dysrhythmia    PROXIMAL MARGIN FIBRILATION   GERD (gastroesophageal reflux disease)    Hyperlipidemia    Hypertension    Myocardial infarction (HCC) 1996   PAD (peripheral artery disease) (HCC)    a. s/p stenting 08/2012, 02/2013.    PAF (paroxysmal atrial fibrillation) (HCC)    Sleep apnea    "have mask; have to start wearing it" (03/18/2018)   Stroke [facility]) ~ 2014   denies residual on 03/18/2018   Thrombosis of lower extremity    a. Listed on patient's medical bracelet - at [place].   Type II diabetes mellitus (HCC)     Past Surgical History:  Procedure Laterality Date   ABDOMINAL AORTOGRAM W/LOWER EXTREMITY N/A 07/25/2019   Procedure: ABDOMINAL AORTOGRAM W/LOWER EXTREMITY;  Surgeon: [name], MD;  Location: [facility] INVASIVE CV LAB;  Service: Cardiovascular;  Laterality: N/A;   ANGIOPLASTY Right 06/03/2020   Procedure: BALLOON ANGIOPLASTY POPITEAL ARTERY;  Surgeon: [name], MD;  Location: [facility] OR;  Service: Vascular;  Laterality: Right;   ANTERIOR LUMBAR FUSION  1981   APPENDECTOMY     APPLICATION OF WOUND VAC Right 01/09/2018   Procedure: APPLICATION OF WOUND VAC ON RIGHT LOWER LEG;  Surgeon: [name],  MD;  Location: [facility] OR;  Service: Vascular;  Laterality: Right;   ATRIAL FIBRILLATION ABLATION N/A 12/07/2020   Procedure: ATRIAL FIBRILLATION ABLATION;  Surgeon: [name], MD;  Location: [facility] INVASIVE CV LAB;  Service: Cardiovascular;  Laterality: N/A;   BACK SURGERY     BUBBLE STUDY  12/07/2020   Procedure: BUBBLE STUDY;  Surgeon: [name], MD;  Location: [facility] ENDOSCOPY;  Service: Cardiovascular;;   CATARACT EXTRACTION W/ INTRAOCULAR LENS  IMPLANT, BILATERAL Bilateral    CORONARY ANGIOPLASTY WITH STENT PLACEMENT     "I've got 1 stent in there"  (03/18/2018)   FASCIOTOMY Right 01/09/2018   Procedure: FASCIOTOMY RIGHT LOWER LEG;  Surgeon: [name], MD;  Location: [facility] OR;  Service: Vascular;  Laterality: Right;   FASCIOTOMY Right 06/04/2020   Procedure: RIGHT LOWER EXTREMITY FASCIOTOMY;  Surgeon: Richrd Char, MD;  Location: [facility] OR;  Service: Vascular;  Laterality: Right;   FASCIOTOMY CLOSURE Right 01/11/2018   Procedure: FASCIOTOMY CLOSURE RIGHT CALF;  Surgeon: [name], MD;  Location: [facility] OR;  Service: Vascular;  Laterality: Right;   FEMORAL-POPLITEAL BYPASS GRAFT Right 01/09/2018   Procedure: RIGHT FEMORAL-POPLITEAL ARTERY BYPASS USING PROPATEN X 80CM VASCULAR GRAFT;  Surgeon: [name], MD;  Location: [facility] OR;  Service: Vascular;  Laterality: Right;   HEMORRHOID SURGERY     LEFT HEART CATH AND CORONARY ANGIOGRAPHY N/A 03/19/2018   Procedure: LEFT HEART CATH AND CORONARY ANGIOGRAPHY;  Surgeon: Swaziland, Peter M, MD;  Location: [facility] INVASIVE CV LAB;  Service: Cardiovascular;  Laterality: N/A;   LOWER EXTREMITY ANGIOGRAM Right 06/02/2020   Procedure: RIGHT LOWER EXTREMITY ANGIOGRAM INTERVENTION;  Surgeon: [name], MD;  Location: [facility] OR;  Service: Vascular;  Laterality: Right;   LOWER EXTREMITY ANGIOGRAM Right 06/03/2020   Procedure: RIGHT LOWER EXTREMITY ANGIOGRAM from FEMORAL APPROACH;  Surgeon: [name], MD;  Location: [facility] OR;  Service: Vascular;  Laterality: Right;   LOWER EXTREMITY ANGIOGRAPHY N/A 08/31/2019   Procedure: LOWER EXTREMITY ANGIOGRAPHY;  Surgeon: Young Hensen, MD;  [facility]: [facility] INVASIVE CV LAB;  Service: Cardiovascular;  Laterality: N/A;   LOWER EXTREMITY ANGIOGRAPHY Right 01/18/2020   Procedure: LOWER EXTREMITY ANGIOGRAPHY;  Surgeon: Young Hensen, MD;  [facility]: [facility] INVASIVE CV LAB;  Service: Cardiovascular;  Laterality: Right;   LOWER EXTREMITY ANGIOGRAPHY N/A 09/11/2022   Procedure: Lower Extremity Angiography;  Surgeon: [name], MD;  Location: [facility] INVASIVE CV LAB;  Service:  Cardiovascular;  Laterality: N/A;  With thrombolysis catheter insertion   PATCH ANGIOPLASTY Right 01/09/2018   Procedure: PATCH ANGIOPLASTY USING Corinna Dickens BIOLOGIC 1CM X 6CM PATCH;  Surgeon: [name], MD;  Location: [facility] OR;  Service: Vascular;  Laterality: Right;   PERIPHERAL VASCULAR ATHERECTOMY Right 01/19/2020   Procedure: PERIPHERAL VASCULAR ATHERECTOMY;  Surgeon: Young Hensen, MD;  [facility] INVAS[facility] CV LAB;  Service: Cardiovascular;  Laterality: Right;  Femoral popliteal and tibioperoneal arteries.   PERIPHERAL VASCULAR BALLOON ANGIOPLASTY Right 09/01/2019   Procedure: PERIPHERAL VASCULAR BALLOON ANGIOPLASTY;  Surgeon: [name], MD;  Location: [facility] INVASIVE CV LAB;  Service: Cardiovascular;  Laterality: Right;  peroneal artery.   PERIPHERAL VASCULAR INTERVENTION Right 07/25/2019   Procedure: PERIPHERAL VASCULAR INTERVENTION;  Surgeon: [name], MD;  Location: [facility] INVASIVE CV LAB;  Service: Cardiovascular;  Laterality: Right;  SFA X 3   PERIPHERAL VASCULAR INTERVENTION Right 09/01/2019   Procedure: PERIPHERAL VASCULAR INTERVENTION;  Surgeon: [name], MD;  Location: [facility] INVASIVE CV LAB;  Service:  Cardiovascular;  Laterality: Right;  superficial femoral   PERIPHERAL VASCULAR THROMBECTOMY Right 09/01/2019   Procedure: PERIPHERAL VASCULAR THROMBECTOMY;  Surgeon: [name], MD;  Location: [facility] INVASIVE CV LAB;  Service: Cardiovascular;  Laterality: Right;   PERIPHERAL VASCULAR THROMBECTOMY Right 01/18/2020   Procedure: PERIPHERAL VASCULAR THROMBECTOMY;  Surgeon: Young Hensen, MD;  [facility] INVASIVE CV LAB;  Service: Cardiovascular;  Laterality: Right;   PERIPHERAL VASCULAR THROMBECTOMY N/A 01/19/2020   Procedure: LYSIS RECHECK;  Surgeon: Young Hensen, MD;  Location: [facility] INVASIVE CV LAB;  Service: Cardiovascular;  Laterality: N/A;   PERIPHERAL VASCULAR THROMBECTOMY N/A 09/12/2022   Procedure: LYSIS RECHECK;   Surgeon: [name], MD;  Location: [facility] INVASIVE CV LAB;  Service: Cardiovascular;  Laterality: N/A;   POSTERIOR LUMBAR FUSION  1978   TEE WITHOUT CARDIOVERSION N/A 12/07/2020   Procedure: TRANSESOPHAGEAL ECHOCARDIOGRAM (TEE);  Surgeon: [name], MD;  Location: [facility] ENDOSCOPY;  Service: Cardiovascular;  Laterality: N/A;   THROMBECTOMY FEMORAL ARTERY Right 01/09/2018   Procedure: THROMBECTOMY RIGHT LOWER LEG;  Surgeon: [name], MD;  Location: [facility] OR;  Service: Vascular;  Laterality: Right;   TONSILLECTOMY      ROS:   General:  No weight loss, Fever, chills  HEENT: No recent headaches, no nasal bleeding, no visual changes, no sore throat  Neurologic: No dizziness, blackouts, seizures. No recent symptoms of stroke or mini- stroke. No recent episodes of slurred speech, or temporary blindness.  Cardiac: No recent episodes of chest pain/pressure, no shortness of breath at rest.  No shortness of breath with exertion.  Denies history of atrial fibrillation or irregular heartbeat  Vascular: No history of rest pain in feet.  Positive recent history of claudication.  No history of non-healing ulcer, No history of DVT   Pulmonary: No home oxygen, no productive cough, no hemoptysis,  No asthma or wheezing  Musculoskeletal:  [ ]  Arthritis, [ ]  Low back pain,  [ ]  Joint pain  Hematologic:No history of hypercoagulable state.  No history of easy bleeding.  No history of anemia  Gastrointestinal: No hematochezia or melena,  No gastroesophageal reflux, no trouble swallowing  Urinary: [ ]  chronic Kidney disease, [ ]  on HD - [ ]  MWF or [ ]  TTHS, [ ]  Burning with urination, [ ]  Frequent urination, [ ]  Difficulty urinating;   Skin: No rashes  Psychological: No history of anxiety,  No history of depression  Social History Social History   Tobacco Use   Smoking status: Former    Current packs/day: 0.00    Average packs/day: 3.0 packs/day for 30.0 years (90.0 ttl pk-yrs)    Types:  Cigarettes    Start date: 51    Quit date: 67    Years since quitting: 29.3   Smokeless tobacco: Never  Vaping Use   Vaping status: Never Used  Substance Use Topics   Alcohol use: No    Comment: Remote alcohol use   Drug use: Never    Family History Family History  Problem Relation Age of Onset   CAD Mother    CAD Brother    Hypertension Other     Allergies  Allergies  Allergen Reactions   Oxycodone  Nausea And Vomiting   Statins Other (See Comments)    Muscle and Bone pain    Amiodarone      Caused issues and was stopped by MD- possible was the cause for the patient's heart "going in and out of rhythm" multiple times   Donepezil Other (  See Comments)    Per [facility] Insomnia   Imipramine Other (See Comments)    Per [facility]   Oxybutynin Chloride Swelling and Other (See Comments)    Per [facility]     Current Outpatient Medications  Medication Sig Dispense Refill   acetaminophen  (TYLENOL ) 500 MG tablet Take 500 mg by mouth 4 (four) times daily as needed for moderate pain or headache.     albuterol  (VENTOLIN  HFA) 108 (90 Base) MCG/ACT inhaler Inhale 1 puff into the lungs every 6 (six) hours as needed for shortness of breath.     apixaban  (ELIQUIS ) 5 MG TABS tablet Take 1 tablet (5 mg total) by mouth 2 (two) times daily. 60 tablet 11   Cholecalciferol  (VITAMIN D) 50 MCG (2000 UT) tablet Take 2,000 Units by mouth daily.     clopidogrel  (PLAVIX ) 75 MG tablet Take 1 tablet by mouth daily.     ezetimibe  (ZETIA ) 10 MG tablet Take 10 mg by mouth every evening.      ferrous sulfate  325 (65 FE) MG tablet Take 325 mg by mouth daily with breakfast.     hydrochlorothiazide  (MICROZIDE ) 12.5 MG capsule Take 12.5 mg by mouth daily.     memantine  (NAMENDA ) 10 MG tablet Take 20 mg by mouth at bedtime.     metoprolol  tartrate (LOPRESSOR ) 50 MG tablet Take 50 mg by mouth 2 (two) times daily.     nitroGLYCERIN  (NITROSTAT ) 0.4 MG SL tablet Place 0.4 mg under the tongue every 5 (five) minutes x 3  doses as needed for chest pain.     pantoprazole  (PROTONIX ) 40 MG tablet Take 40 mg by mouth daily before breakfast.     ramipril  (ALTACE ) 10 MG capsule Take 10 mg by mouth 2 (two) times daily.     rivastigmine  (EXELON ) 6 MG capsule Take 6 mg by mouth 2 (two) times daily.     vitamin B-12 (CYANOCOBALAMIN ) 500 MCG tablet Take 500 mcg by mouth daily.     No current facility-administered medications for this visit.    Physical Examination  Vitals:   10/22/23 1227  BP: (!) 169/74  Pulse: 60  Temp: 98.7 F (37.1 C)  SpO2: 94%  Weight: 185 lb 4.8 oz (84.1 kg)    Body mass index is 27.36 kg/m.  General:  Alert and oriented, no acute distress HEENT: Normal Neck: No bruit or JVD Pulmonary: Clear to auscultation bilaterally Cardiac: Regular Rate and Rhythm without murmur Abdomen: Soft, non-tender, non-distended, no mass, no scars Skin: No rash, right LE well healed incisions Extremity Pulses:   radial,femoral pulses bilaterally non palpable pedal pulses Musculoskeletal:positive LE edema  Neurologic: Upper and lower extremity motor grossly intact and symmetric  DATA:     ABI Findings:  +--------+------------------+-----+-------------------+--------+  Right  Rt Pressure (mmHg)IndexWaveform           Comment   +--------+------------------+-----+-------------------+--------+  [id number]                                                +--------+------------------+-----+-------------------+--------+  PTA    33                0.18 dampened monophasic          +--------+------------------+-----+-------------------+--------+  DP  absent                       +--------+------------------+-----+-------------------+--------+   +---------+------------------+-----+----------+-------+  Left    Lt Pressure (mmHg)IndexWaveform  Comment  +---------+------------------+-----+----------+-------+  Brachial 187                                        +---------+------------------+-----+----------+-------+  PTA     126               0.67 monophasic         +---------+------------------+-----+----------+-------+  DP      110               0.59 monophasic         +---------+------------------+-----+----------+-------+  Great Toe100               0.53                    +---------+------------------+-----+----------+-------+   +-------+-----------+-----------+------------+------------+  ABI/TBIToday's ABIToday's TBIPrevious ABIPrevious TBI  +-------+-----------+-----------+------------+------------+  Right 0.18       absent     0.94        0.57          +-------+-----------+-----------+------------+------------+  Left  0.67       0.53       0.98        0.51          +-------+-----------+-----------+------------+------------+         Bilateral ABIs appear decreased compared to prior study on 12/10/2021.    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   Left: Resting left ankle-brachial index indicates moderate left lower  extremity arterial disease. The left toe-brachial index is abnormal.     +----------+--------+-----+--------+----------+--------+  RIGHT    PSV cm/sRatioStenosisWaveform  Comments  +----------+--------+-----+--------+----------+--------+  PTA Distal17                   monophasicdampened  +----------+--------+-----+--------+----------+--------+       Right Graft #1: fem-pop  +------------------+--------+--------+----------+--------------+                   PSV cm/sStenosisWaveform  Comments        +------------------+--------+--------+----------+--------------+  Inflow           120             monophasic                +------------------+--------+--------+----------+--------------+  Prox Anastomosis  48              monophasic                 +------------------+--------+--------+----------+--------------+  Proximal Graft    0                                         +------------------+--------+--------+----------+--------------+  Mid Graft         0                                         +------------------+--------+--------+----------+--------------+  Distal Graft  not visualized  +------------------+--------+--------+----------+--------------+  Distal Anastomosis                          not visualized  +------------------+--------+--------+----------+--------------+  Outflow                                    not visualized  +------------------+--------+--------+----------+--------------+    Summary:  Right: No color or spectral Doppler flow noted within the observed  segments of the femoral popliteal bypass graft   ASSESSMENT/ PLAN:  PAD s/p multiple right LE intervention.  He has an occluded right fem-pop bypass.  He states his claudication symptoms returned about 3-4 months ago and have gotten progressively worse.  He now has right foot toe pain for 2 weeks.  He denies non healing wounds.    The ABI and arterial duplex show an occluded bypass.  DR. Rosalva Comber reviewed the studies and spoke with him.. We have decided that he would benefit from repeat angiogram with right LE runoff and possible intervention.  He is at high risk of limb loss each time we intervene.  He understands this and wishes to proceed. We will schedule him for Wednesday 10/28/23 with Dr. Rosalva Comber.   Rocky Cipro PA-C [facility] [place] Office: [phone]  MD in clinic [place]

## 2023-10-27 ENCOUNTER — Telehealth: Payer: Self-pay

## 2023-10-27 NOTE — Telephone Encounter (Signed)
 Left message for patient in regards to PV lab time change.  Patient asked to arrive at 7:00a.

## 2023-10-28 ENCOUNTER — Other Ambulatory Visit: Payer: Self-pay

## 2023-10-28 ENCOUNTER — Ambulatory Visit (HOSPITAL_COMMUNITY)
Admission: RE | Admit: 2023-10-28 | Discharge: 2023-10-28 | Disposition: A | Discharge status: Home / Self Care | Attending: Vascular Surgery | Admitting: Vascular Surgery

## 2023-10-28 DIAGNOSIS — Z539 Procedure and treatment not carried out, unspecified reason: Secondary | ICD-10-CM | POA: Insufficient documentation

## 2023-10-28 DIAGNOSIS — I739 Peripheral vascular disease, unspecified: Secondary | ICD-10-CM

## 2023-10-28 DIAGNOSIS — I70211 Atherosclerosis of native arteries of extremities with intermittent claudication, right leg: Secondary | ICD-10-CM | POA: Diagnosis present

## 2023-10-28 LAB — POCT I-STAT, CHEM 8
BUN: 19 mg/dL (ref 8–23)
Calcium, Ion: 1.05 mmol/L — ABNORMAL LOW (ref 1.15–1.40)
Chloride: 101 mmol/L (ref 98–111)
Creatinine, Ser: 1.4 mg/dL — ABNORMAL HIGH (ref 0.61–1.24)
Glucose, Bld: 112 mg/dL — ABNORMAL HIGH (ref 70–99)
HCT: 38 % — ABNORMAL LOW (ref 39.0–52.0)
Hemoglobin: 12.9 g/dL — ABNORMAL LOW (ref 13.0–17.0)
Potassium: 3.3 mmol/L — ABNORMAL LOW (ref 3.5–5.1)
Sodium: 141 mmol/L (ref 135–145)
TCO2: 27 mmol/L (ref 22–32)

## 2023-10-28 MED ORDER — LIDOCAINE HCL (PF) 1 % IJ SOLN
INTRAMUSCULAR | Status: AC
  Filled 2023-10-28: qty 30

## 2023-10-28 MED ORDER — FENTANYL CITRATE (PF) 100 MCG/2ML IJ SOLN
INTRAMUSCULAR | Status: AC
  Filled 2023-10-28: qty 2

## 2023-10-28 MED ORDER — SODIUM CHLORIDE 0.9 % IV SOLN: INTRAVENOUS | Status: DC

## 2023-10-28 MED ORDER — MIDAZOLAM HCL 2 MG/2ML IJ SOLN
INTRAMUSCULAR | Status: AC
  Filled 2023-10-28: qty 2

## 2023-10-30 ENCOUNTER — Other Ambulatory Visit: Payer: Self-pay

## 2023-10-30 ENCOUNTER — Encounter (HOSPITAL_COMMUNITY)
Admission: RE | Disposition: A | Discharge status: Home / Self Care | Payer: Self-pay | Source: Home / Self Care | Attending: Vascular Surgery

## 2023-10-30 ENCOUNTER — Ambulatory Visit (HOSPITAL_COMMUNITY)
Admission: RE | Admit: 2023-10-30 | Discharge: 2023-10-30 | Disposition: A | Discharge status: Home / Self Care | Attending: Vascular Surgery | Admitting: Vascular Surgery

## 2023-10-30 DIAGNOSIS — I70221 Atherosclerosis of native arteries of extremities with rest pain, right leg: Secondary | ICD-10-CM | POA: Diagnosis present

## 2023-10-30 DIAGNOSIS — I998 Other disorder of circulatory system: Secondary | ICD-10-CM

## 2023-10-30 LAB — POCT I-STAT, CHEM 8
BUN: 22 mg/dL (ref 8–23)
Calcium, Ion: 0.98 mmol/L — ABNORMAL LOW (ref 1.15–1.40)
Chloride: 101 mmol/L (ref 98–111)
Creatinine, Ser: 1.4 mg/dL — ABNORMAL HIGH (ref 0.61–1.24)
Glucose, Bld: 111 mg/dL — ABNORMAL HIGH (ref 70–99)
HCT: 40 % (ref 39.0–52.0)
Hemoglobin: 13.6 g/dL (ref 13.0–17.0)
Potassium: 3.3 mmol/L — ABNORMAL LOW (ref 3.5–5.1)
Sodium: 141 mmol/L (ref 135–145)
TCO2: 26 mmol/L (ref 22–32)

## 2023-10-30 LAB — GLUCOSE, CAPILLARY: Glucose-Capillary: 123 mg/dL — ABNORMAL HIGH (ref 70–99)

## 2023-10-30 SURGERY — ABDOMINAL AORTOGRAM
Anesthesia: LOCAL

## 2023-10-30 MED ORDER — FENTANYL CITRATE (PF) 100 MCG/2ML IJ SOLN
INTRAMUSCULAR | Status: DC | PRN
  Administered 2023-10-30 (×2): 50 ug via INTRAVENOUS

## 2023-10-30 MED ORDER — LIDOCAINE HCL (PF) 1 % IJ SOLN
INTRAMUSCULAR | Status: AC
  Filled 2023-10-30: qty 30

## 2023-10-30 MED ORDER — SODIUM CHLORIDE 0.9% FLUSH: 3.0000 mL | INTRAVENOUS | Status: DC | PRN

## 2023-10-30 MED ORDER — SODIUM CHLORIDE 0.9% FLUSH: 3.0000 mL | Freq: Two times a day (BID) | INTRAVENOUS | Status: DC

## 2023-10-30 MED ORDER — HEPARIN (PORCINE) IN NACL 2000-0.9 UNIT/L-% IV SOLN
INTRAVENOUS | Status: DC | PRN
  Administered 2023-10-30: 1000 mL

## 2023-10-30 MED ORDER — MIDAZOLAM HCL 2 MG/2ML IJ SOLN
INTRAMUSCULAR | Status: DC | PRN
  Administered 2023-10-30: 1 mg via INTRAVENOUS

## 2023-10-30 MED ORDER — HYDRALAZINE HCL 20 MG/ML IJ SOLN: 5.0000 mg | INTRAMUSCULAR | Status: DC | PRN

## 2023-10-30 MED ORDER — MIDAZOLAM HCL 2 MG/2ML IJ SOLN
INTRAMUSCULAR | Status: AC
Start: 2023-10-30 — End: ?
  Filled 2023-10-30: qty 2

## 2023-10-30 MED ORDER — SODIUM CHLORIDE 0.9 % WEIGHT BASED INFUSION
1.0000 mL/kg/h | INTRAVENOUS | Status: DC
  Administered 2023-10-30: 1 mL/kg/h via INTRAVENOUS

## 2023-10-30 MED ORDER — ONDANSETRON HCL 4 MG/2ML IJ SOLN: 4.0000 mg | Freq: Four times a day (QID) | INTRAMUSCULAR | Status: DC | PRN

## 2023-10-30 MED ORDER — IODIXANOL 320 MG/ML IV SOLN
INTRAVENOUS | Status: DC | PRN
  Administered 2023-10-30: 60 mL

## 2023-10-30 MED ORDER — LIDOCAINE HCL (PF) 1 % IJ SOLN
INTRAMUSCULAR | Status: DC | PRN
Start: 2023-10-30 — End: 2023-10-30
  Administered 2023-10-30: 15 mL

## 2023-10-30 MED ORDER — FENTANYL CITRATE (PF) 100 MCG/2ML IJ SOLN
INTRAMUSCULAR | Status: AC
  Filled 2023-10-30: qty 2

## 2023-10-30 MED ORDER — ACETAMINOPHEN 325 MG PO TABS: 650.0000 mg | ORAL_TABLET | ORAL | Status: DC | PRN

## 2023-10-30 MED ORDER — SODIUM CHLORIDE 0.9 % IV SOLN: 250.0000 mL | INTRAVENOUS | Status: DC | PRN

## 2023-10-30 MED ORDER — LABETALOL HCL 5 MG/ML IV SOLN: 10.0000 mg | INTRAVENOUS | Status: DC | PRN

## 2023-10-30 NOTE — Progress Notes (Signed)
 Site area: 40F left femoral arterial sheath Site Prior to Removal:  Level 0 Pressure Applied For: 25 minutes Manual:   yes Patient Status During Pull:  stable Post Pull Site:  Level 0 Post Pull Instructions Given:  yes Post Pull Pulses Present: left dp and pt dopplered Dressing Applied:  gauze and tegaderm Bedrest begins @ 1125 Comments:

## 2023-10-30 NOTE — Interval H&P Note (Signed)
 History and Physical Interval Note:  10/30/2023 10:35 AM  Donald Gordon  has presented today for surgery, with the diagnosis of pvd.  The various methods of treatment have been discussed with the patient and family. After consideration of risks, benefits and other options for treatment, the patient has consented to  Procedure(s): ABDOMINAL AORTOGRAM (N/A) Lower Extremity Angiography (Right) as a surgical intervention.  The patient's history has been reviewed, patient examined, no change in status, stable for surgery.  I have reviewed the patient's chart and labs.  Questions were answered to the patient's satisfaction.     [name]

## 2023-10-30 NOTE — Op Note (Signed)
 DATE OF SERVICE: 10/30/2023  PATIENT:  Donald Gordon  83 y.o. 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 Edgardo Goodwill, MD  ASSISTANT: none  ANESTHESIA:   local and IV sedation  ESTIMATED BLOOD LOSS: minimal  LOCAL MEDICATIONS USED:  LIDOCAINE    COUNTS: confirmed correct.  PATIENT DISPOSITION:  PACU - hemodynamically stable.   Delay start of Pharmacological VTE agent (>24hrs) due to surgical blood loss or risk of bleeding: no  INDICATION FOR PROCEDURE: Donald Gordon is a 83 y.o. male with right foot ischemic rest pain and non-invasive evidence of severe peripheral arterial disease. After careful discussion of risks, benefits, and alternatives the patient was offered angiography. The patient understood and wished to proceed.  OPERATIVE FINDINGS:   Left renal artery: patent Right renal artery: patent  Infrarenal aorta: patent  Left common iliac artery: patent Right common iliac artery: patent  Left internal iliac artery: patent Right internal iliac artery: patent  Left external iliac artery: patent Right external iliac artery: prior stenting widely patent  Left common femoral artery: not studied Right common femoral artery: patent  Left profunda femoris artery: not studied Right profunda femoris artery: patent  Left superficial femoral artery: not studied Right superficial femoral artery: occluded  Left popliteal artery: not studied Right popliteal artery: occluded  Left anterior tibial artery: not studied Right anterior tibial artery: occluded  Left tibioperoneal trunk: not studied Right tibioperoneal trunk: reconstitutes via collaterals below prior stenting  Left peroneal artery: not  studied Right peroneal artery: patent, but diminutive  Left posterior tibial artery: not studied Right posterior tibial artery: patent to the ankle  Left pedal circulation: not studied Right pedal circulation: disadvantaged   DESCRIPTION OF PROCEDURE: After identification of the patient in the pre-operative holding area, the patient was transferred to the operating room. The patient was positioned supine on the operating room table. Anesthesia was induced. The groins was prepped and draped in standard fashion. A surgical pause was performed confirming correct patient, procedure, and operative location.  The left groin was anesthetized with subcutaneous injection of 1% lidocaine . Using ultrasound guidance, the left common femoral artery was accessed with micropuncture technique. Fluoroscopy was used to confirm cannulation over the femoral head. The 36F micropuncture sheath was upsized to 47F.   A Benson wire was advanced into the distal aorta. Over the wire an omni flush catheter was advanced to the level of L2. Aortogram was performed - see above for details.   The right common iliac artery was selected with an omniflush catheter and benson guidewire. The wire was advanced into the common femoral artery. Over the wire the omni flush catheter was advanced into the external iliac artery. Selective angiography was performed - see above for details.   The sheath was left in place to be removed in the recovery area.  Conscious sedation was administered with the use of IV fentanyl  and midazolam  under continuous physician and nurse monitoring.  Heart rate, blood pressure, and oxygen saturation were continuously monitored.  Total sedation time was 19 minutes  Upon completion of the case instrument and sharps counts were confirmed correct. The patient was transferred to the PACU in good condition. I was present for all portions of the procedure.  PLAN:  Plavix  / Eliquis  / Statin. Needs right CFA-PT bypass with  PTFE.   Heber Little. Edgardo Goodwill, MD [facility] Phone Number: [phone] 10/30/2023 10:40 AM

## 2023-10-30 NOTE — Progress Notes (Signed)
 Patient and friend was given discharge instructions. Both verbalized understanding.

## 2023-10-31 ENCOUNTER — Encounter (HOSPITAL_COMMUNITY): Payer: Self-pay | Admitting: Vascular Surgery

## 2023-11-10 NOTE — Progress Notes (Addendum)
 Surgical Instructions   Your procedure is scheduled on Nov 20, 2023. Report to [facility] Main Entrance "A" at 5:30 A.M., then check in with the Admitting office. Any questions or running late day of surgery: call [phone]  Questions prior to your surgery date: call [phone], Monday-Friday, 8am-4pm. If you experience any cold or flu symptoms such as cough, fever, chills, shortness of breath, etc. between now and your scheduled surgery, please notify us  at the above number.     Remember:  Do not eat or drink  after midnight the night before your surgery    Take these medicines the morning of surgery with A SIP OF WATER  finasteride  (PROSCAR )  metoprolol  tartrate (LOPRESSOR )  pantoprazole  (PROTONIX )  rivastigmine  (EXELON )   May take these medicines IF NEEDED: acetaminophen  (TYLENOL )  albuterol  (VENTOLIN  HFA) inhaler  nitroGLYCERIN  (NITROSTAT ) PLEASE CALL [phone] IF TAKEN BETWEEN NOW AND SURGERY  apixaban  (ELIQUIS ) LAST DOSE 11-16-23 clopidogrel  (PLAVIX ) LAST DOSE 11-14-23   One week prior to surgery, STOP taking any Aspirin  (unless otherwise instructed by your surgeon) Aleve, Naproxen, Ibuprofen, Motrin, Advil, Goody's, BC's, all herbal medications, fish oil, and non-prescription vitamins.                     Do NOT Smoke (Tobacco/Vaping) for 24 hours prior to your procedure.  If you use a CPAP at night, you may bring your mask/headgear for your overnight stay.   You will be asked to remove any contacts, glasses, piercing's, hearing aid's, dentures/partials prior to surgery. Please bring cases for these items if needed.    Patients discharged the day of surgery will not be allowed to drive home, and someone needs to stay with them for 24 hours.  SURGICAL WAITING ROOM VISITATION Patients may have no more than 2 support people in the waiting area - these visitors may rotate.   Pre-op nurse will coordinate an appropriate time for 1 ADULT support person, who may not  rotate, to accompany patient in pre-op.  Children under the age of 13 must have an adult with them who is not the patient and must remain in the main waiting area with an adult.  If the patient needs to stay at the hospital during part of their recovery, the visitor guidelines for inpatient rooms apply.  Please refer to the [facility] website for the visitor guidelines for any additional information.   If you received a COVID test during your pre-op visit  it is requested that you wear a mask when out in public, stay away from anyone that may not be feeling well and notify your surgeon if you develop symptoms. If you have been in contact with anyone that has tested positive in the last 10 days please notify you surgeon.      Pre-operative CHG Bathing Instructions   You can play a key role in reducing the risk of infection after surgery. Your skin needs to be as free of germs as possible. You can reduce the number of germs on your skin by washing with CHG (chlorhexidine  gluconate) soap before surgery. CHG is an antiseptic soap that kills germs and continues to kill germs even after washing.   DO NOT use if you have an allergy to chlorhexidine /CHG or antibacterial soaps. If your skin becomes reddened or irritated, stop using the CHG and notify one of our RNs at [phone].              TAKE A SHOWER THE NIGHT BEFORE SURGERY  AND THE DAY OF SURGERY    Please keep in mind the following:  DO NOT shave, including legs and underarms, 48 hours prior to surgery.   You may shave your face before/day of surgery.  Place clean sheets on your bed the night before surgery Use a clean washcloth (not used since being washed) for each shower. DO NOT sleep with pet's night before surgery.  CHG Shower Instructions:  Wash your face and private area with normal soap. If you choose to wash your hair, wash first with your normal shampoo.  After you use shampoo/soap, rinse your hair and body thoroughly to  remove shampoo/soap residue.  Turn the water OFF and apply half the bottle of CHG soap to a CLEAN washcloth.  Apply CHG soap ONLY FROM YOUR NECK DOWN TO YOUR TOES (washing for 3-5 minutes)  DO NOT use CHG soap on face, private areas, open wounds, or sores.  Pay special attention to the area where your surgery is being performed.  If you are having back surgery, having someone wash your back for you may be helpful. Wait 2 minutes after CHG soap is applied, then you may rinse off the CHG soap.  Pat dry with a clean towel  Put on clean pajamas    Additional instructions for the day of surgery: DO NOT APPLY any lotions, deodorants, cologne, or perfumes.   Do not wear jewelry or makeup Do not wear nail polish, gel polish, artificial nails, or any other type of covering on natural nails (fingers and toes) Do not bring valuables to the hospital. [facility] is not responsible for valuables/personal belongings. Put on clean/comfortable clothes.  Please brush your teeth.  Ask your nurse before applying any prescription medications to the skin.

## 2023-11-11 ENCOUNTER — Other Ambulatory Visit: Payer: Self-pay

## 2023-11-11 ENCOUNTER — Telehealth: Payer: Self-pay

## 2023-11-11 ENCOUNTER — Encounter (HOSPITAL_COMMUNITY): Payer: Self-pay

## 2023-11-11 ENCOUNTER — Encounter (HOSPITAL_COMMUNITY)
Admission: RE | Admit: 2023-11-11 | Discharge: 2023-11-11 | Disposition: A | Discharge status: Home / Self Care | Source: Ambulatory Visit | Attending: Vascular Surgery | Admitting: Vascular Surgery

## 2023-11-11 VITALS — BP 134/76 | HR 79 | Temp 97.9°F | Resp 18 | Ht 69.0 in | Wt 184.7 lb

## 2023-11-11 DIAGNOSIS — R008 Other abnormalities of heart beat: Secondary | ICD-10-CM | POA: Insufficient documentation

## 2023-11-11 DIAGNOSIS — I252 Old myocardial infarction: Secondary | ICD-10-CM | POA: Insufficient documentation

## 2023-11-11 DIAGNOSIS — Z8673 Personal history of transient ischemic attack (TIA), and cerebral infarction without residual deficits: Secondary | ICD-10-CM | POA: Insufficient documentation

## 2023-11-11 DIAGNOSIS — E876 Hypokalemia: Secondary | ICD-10-CM | POA: Insufficient documentation

## 2023-11-11 DIAGNOSIS — E119 Type 2 diabetes mellitus without complications: Secondary | ICD-10-CM | POA: Insufficient documentation

## 2023-11-11 DIAGNOSIS — Z01812 Encounter for preprocedural laboratory examination: Secondary | ICD-10-CM | POA: Diagnosis present

## 2023-11-11 DIAGNOSIS — Z87891 Personal history of nicotine dependence: Secondary | ICD-10-CM | POA: Diagnosis not present

## 2023-11-11 DIAGNOSIS — J449 Chronic obstructive pulmonary disease, unspecified: Secondary | ICD-10-CM | POA: Insufficient documentation

## 2023-11-11 DIAGNOSIS — I2510 Atherosclerotic heart disease of native coronary artery without angina pectoris: Secondary | ICD-10-CM | POA: Diagnosis not present

## 2023-11-11 DIAGNOSIS — I472 Ventricular tachycardia, unspecified: Secondary | ICD-10-CM | POA: Diagnosis not present

## 2023-11-11 DIAGNOSIS — E785 Hyperlipidemia, unspecified: Secondary | ICD-10-CM | POA: Insufficient documentation

## 2023-11-11 DIAGNOSIS — Z01818 Encounter for other preprocedural examination: Secondary | ICD-10-CM

## 2023-11-11 DIAGNOSIS — I10 Essential (primary) hypertension: Secondary | ICD-10-CM | POA: Insufficient documentation

## 2023-11-11 DIAGNOSIS — I998 Other disorder of circulatory system: Secondary | ICD-10-CM | POA: Diagnosis not present

## 2023-11-11 DIAGNOSIS — I4891 Unspecified atrial fibrillation: Secondary | ICD-10-CM | POA: Insufficient documentation

## 2023-11-11 LAB — COMPREHENSIVE METABOLIC PANEL WITH GFR
ALT: 11 U/L (ref 0–44)
AST: 14 U/L — ABNORMAL LOW (ref 15–41)
Albumin: 3.7 g/dL (ref 3.5–5.0)
Alkaline Phosphatase: 74 U/L (ref 38–126)
Anion gap: 10 (ref 5–15)
BUN: 15 mg/dL (ref 8–23)
CO2: 27 mmol/L (ref 22–32)
Calcium: 7.8 mg/dL — ABNORMAL LOW (ref 8.9–10.3)
Chloride: 102 mmol/L (ref 98–111)
Creatinine, Ser: 1.37 mg/dL — ABNORMAL HIGH (ref 0.61–1.24)
GFR, Estimated: 51 mL/min — ABNORMAL LOW (ref >60)
Glucose, Bld: 149 mg/dL — ABNORMAL HIGH (ref 70–99)
Potassium: 2.8 mmol/L — ABNORMAL LOW (ref 3.5–5.1)
Sodium: 139 mmol/L (ref 135–145)
Total Bilirubin: 0.7 mg/dL (ref 0.0–1.2)
Total Protein: 6.9 g/dL (ref 6.5–8.1)

## 2023-11-11 LAB — CBC
HCT: 41.4 % (ref 39.0–52.0)
Hemoglobin: 13.9 g/dL (ref 13.0–17.0)
MCH: 32 pg (ref 26.0–34.0)
MCHC: 33.6 g/dL (ref 30.0–36.0)
MCV: 95.2 fL (ref 80.0–100.0)
Platelets: 253 10*3/uL (ref 150–400)
RBC: 4.35 MIL/uL (ref 4.22–5.81)
RDW: 12.3 % (ref 11.5–15.5)
WBC: 5.6 10*3/uL (ref 4.0–10.5)
nRBC: 0 % (ref 0.0–0.2)

## 2023-11-11 LAB — URINALYSIS, ROUTINE W REFLEX MICROSCOPIC
Bacteria, UA: NONE SEEN
Bilirubin Urine: NEGATIVE
Glucose, UA: NEGATIVE mg/dL
Ketones, ur: NEGATIVE mg/dL
Leukocytes,Ua: NEGATIVE
Nitrite: NEGATIVE
Protein, ur: 100 mg/dL — AB
Specific Gravity, Urine: 1.016 (ref 1.005–1.030)
pH: 5 (ref 5.0–8.0)

## 2023-11-11 LAB — APTT: aPTT: 36 s (ref 24–36)

## 2023-11-11 LAB — HEMOGLOBIN A1C
Hgb A1c MFr Bld: 5.4 % (ref 4.8–5.6)
Mean Plasma Glucose: 108.28 mg/dL

## 2023-11-11 LAB — SURGICAL PCR SCREEN
MRSA, PCR: NEGATIVE
Staphylococcus aureus: NEGATIVE

## 2023-11-11 LAB — GLUCOSE, CAPILLARY: Glucose-Capillary: 142 mg/dL — ABNORMAL HIGH (ref 70–99)

## 2023-11-11 LAB — PROTIME-INR
INR: 1.3 — ABNORMAL HIGH (ref 0.8–1.2)
Prothrombin Time: 15.9 s — ABNORMAL HIGH (ref 11.4–15.2)

## 2023-11-11 NOTE — Telephone Encounter (Signed)
 Attempted to inform patient's PCP at [facility] clinic (Dr. Rayann Cage) of patient's most recent K level (2.8).  Unable to connect to anyone at clinic.    Staff message sent to Dr. Carolynne Citron and PA, Valeda Garter.

## 2023-11-11 NOTE — Progress Notes (Signed)
 PCP - Dr. Rayann Cage- [facility] Cardiologist - Dr. 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 Pt does not check CBG at home and does not know typical fasting levels  Last dose of GLP1 agonist-  n/a   Blood Thinner Instructions: Last dose Eliquis  5/26. Last dose Plavix  5/24. Aspirin  Instructions: n/a  ERAS Protcol - no, NPO   COVID TEST- n/a   Anesthesia review: yes, cardiac hx  Patient denies shortness of breath, fever, cough and chest pain at PAT appointment   All instructions explained to the patient, with a verbal understanding of the material. Patient agrees to go over the instructions while at home for a better understanding.  The opportunity to ask questions was provided.

## 2023-11-12 NOTE — Progress Notes (Signed)
 Anesthesia Chart Review:  83 year old male follows with cardiology at the [facility] for history of HTN, HLD, HTN, CVA 2014, CAD s/p MI with PTCA 1996 and NSTEMI with DES to RCA 09/2022, A-fib s/p ablation 2022, PVD.  Last seen in outpatient follow-up on 07/08/2023.  At that time he was noted to be doing well from a cardiac standpoint, asymptomatic.  His blood pressure was elevated his amlodipine  was increased from 2.5 to 10 mg daily he was continued on ramipril  10 twice daily, Lopressor  50 twice daily, HCTZ 12.5 daily.  He was also continued on Eliquis  and Plavix .  Vascular history summarized in note by Arnaldo Betters, PA-C on 10/22/2023: Unknown previous vascular surgery at a different institution, does have right external iliac stents 01/09/2018: Right common femoral to below-knee popliteal artery bypass graft with PTFE, tibial thrombectomy and 4 compartment fasciotomy for ischemia [name]) 01/11/2018: Fasciotomy closure [name]) 07/25/2019: Restenting of previous PTFE bypass graft with stent to the right tibioperoneal trunk, popliteal artery, SFA with two 5x250 Viabahn distally and a 6 x 50 Viabahn proximally [name], rest pain) 08/31/2019: Initiation of thrombolytic therapy for rest pain and occluded stents [name]) 09/01/2019: Penumbra mechanical thrombectomy with stenting of the tibioperoneal trunk using a 5 x 60 Tiger us , balloon angioplasty of the peroneal artery with a 2.5 mm balloon and proximal bypass graft stenting into the common femoral artery with a 6 x 60 Eluvia  Recent angiography by Dr. Edgardo Goodwill on 10/30/2023 revealed occluded right SFA, right popliteal, and right anterior tibial artery.  Right CFA-PT bypass with PTFE was recommended.  Other pertinent history includes former smoker (quit 1996), Alzheimer's dementia, RLL lung nodule, COPD maintained on Combivent, GERD on PPI, hearing loss.  DM2 listed in patient history, he denies this, he is not on any anti-DM medications.  A1c 5.4 on preop labs.  Patient  reports last dose Eliquis  11/16/2023 and last dose Plavix  11/14/2023.  Preop labs reviewed, moderate hypokalemia potassium 2.8, creatinine mildly elevated 1.37, moderate hypercalcemia with calcium  7.8, otherwise unremarkable.  Potassium result was called to surgeons office.  Will check i-STAT on day of surgery.  EKG 10/28/2023: Sinus rhythm with Premature supraventricular complexes.  Rate 72. Right bundle branch block  Pulmonary function tests 02/22/2020: FVC: 2.56 (65.7) FEV1: 1.50 (54.0) - No BD FEV1/FVC: 59 TLC: 4.88 (70.7) RV: 2.32 (83) DLCO: 11.87 (50.2)   14-day event monitor 02/2023: SUMMARY: Patient had a min HR of 42 bpm, max HR of 193 bpm, and avg HR of 65  bpm. Predominant underlying rhythm was Sinus Rhythm. Bundle Branch Block/IVCD was present. 1 run of Ventricular Tachycardia occurred lasting 4  beats with a max rate of 187 bpm (avg 179 bpm). 245 Supraventricular Tachycardia runs occurred, the run with the fastest interval lasting 4 beats  with a max rate of 193 bpm, the longest lasting 12.2 secs with an avg rate of 116 bpm. Isolated SVEs were occasional 1.2%, SVE Couplets and SVE  Triplets were rare. Ventricular Bigeminy and Trigeminy were present.  Cath and PCI 10/17/2022: * Mid LAD lesion is 40% stenosed. * Mid Cx lesion is 40% stenosed. * Prox LAD lesion is 35% stenosed. * Prox RCA lesion is 95% stenosed. * Mid RCA lesion is 35% stenosed. * Dist RCA lesion is 40% stenosed.  TTE 09/2022: * Left Ventricle: Systolic function is low normal. EF: 50-55%. Quantitative  analysis of left ventricular Global Longitudinal Strain (GLS) imaging is  -11.900%. Ejection fraction measured by 3D is 46%, * Left Ventricle: There is mild  concentric hypertrophy. * Left Ventricle: The base of the posterior wall is akinetic. The base and the  midportion of the inferior wall are akinetic. * Aortic Valve: Mild aortic valve regurgitation. * Mitral Valve: There is mild regurgitation. * IVC/SVC:  The inferior vena cava demonstrates a diameter of <=2.1 cm and  collapses <50%; therefore, the right atrial pressure is estimated at 8 mmHg.   Edilia Gordon [facility] Short Stay Center/Anesthesiology Phone [phone] 11/12/2023 2:48 PM

## 2023-11-12 NOTE — Anesthesia Preprocedure Evaluation (Signed)
 Anesthesia Evaluation  Patient identified by MRN, date of birth, ID band Patient awake    Reviewed: Allergy & Precautions, NPO status , Patient's Chart, lab work & pertinent test results  Airway Mallampati: II  TM Distance: >3 FB Neck ROM: Full    Dental  (+) Poor Dentition, Missing   Pulmonary sleep apnea , COPD,  COPD inhaler, former smoker   Pulmonary exam normal        Cardiovascular hypertension, + CAD, + Past MI, + Cardiac Stents, + Peripheral Vascular Disease and +CHF  + dysrhythmias Atrial Fibrillation  Rhythm:Regular Rate:Normal     Neuro/Psych       Dementia CVA  negative psych ROS   GI/Hepatic Neg liver ROS,GERD  ,,  Endo/Other  diabetes    Renal/GU      Musculoskeletal  (+) Arthritis , Osteoarthritis,    Abdominal Normal abdominal exam  (+)   Peds  Hematology Lab Results      Component                Value               Date                      WBC                      5.6                 11/11/2023                HGB                      13.9                11/11/2023                HCT                      41.4                11/11/2023                MCV                      95.2                11/11/2023                PLT                      253                 11/11/2023             Lab Results      Component                Value               Date                      NA                       139                 11/11/2023  K                        2.8 (L)             11/11/2023                CO2                      27                  11/11/2023                GLUCOSE                  149 (H)             11/11/2023                BUN                      15                  11/11/2023                CREATININE               1.37 (H)            11/11/2023                CALCIUM                   7.8 (L)             11/11/2023                EGFR                     46 (L)               12/05/2020                GFRNONAA                 51 (L)              11/11/2023              Anesthesia Other Findings   Reproductive/Obstetrics                             Anesthesia Physical Anesthesia Plan  ASA: 4  Anesthesia Plan: General   Post-op Pain Management:    Induction: Intravenous  PONV Risk Score and Plan: 2 and Ondansetron , Dexamethasone  and Treatment may vary due to age or medical condition  Airway Management Planned: Mask and Oral ETT  Additional Equipment: Arterial line  Intra-op Plan:   Post-operative Plan: Possible Post-op intubation/ventilation  Informed Consent: I have reviewed the patients History and Physical, chart, labs and discussed the procedure including the risks, benefits and alternatives for the proposed anesthesia with the patient or authorized representative who has indicated his/her understanding and acceptance.     Dental advisory given  Plan Discussed with: CRNA  Anesthesia Plan Comments: (DOS note: 2 large bore IV (16g), arterial line, or CVC if necessary   PAT note by Rudy Costain, PA-C: 83 year old male follows with cardiology at the [facility] for history of HTN, HLD, HTN, CVA 2014, CAD s/p MI with  PTCA 1996 and NSTEMI with DES to RCA 09/2022, A-fib s/p ablation 2022, PVD.  Last seen in outpatient follow-up on 07/08/2023.  At that time he was noted to be doing well from a cardiac standpoint, asymptomatic.  His blood pressure was elevated his amlodipine  was increased from 2.5 to 10 mg daily he was continued on ramipril  10 twice daily, Lopressor  50 twice daily, HCTZ 12.5 daily.  He was also continued on Eliquis  and Plavix .  Vascular history summarized in note by Arnaldo Betters, PA-C on 10/22/2023: Unknown previous vascular surgery at a different institution, does have right external iliac stents 01/09/2018: Right common femoral to below-knee popliteal artery bypass graft with PTFE, tibial thrombectomy and 4  compartment fasciotomy for ischemia [name]) 01/11/2018: Fasciotomy closure [name]) 07/25/2019: Restenting of previous PTFE bypass graft with stent to the right tibioperoneal trunk, popliteal artery, SFA with two 5x250 Viabahn distally and a 6 x 50 Viabahn proximally [name], rest pain) 08/31/2019: Initiation of thrombolytic therapy for rest pain and occluded stents [name]) 09/01/2019: Penumbra mechanical thrombectomy with stenting of the tibioperoneal trunk using a 5 x 60 Tiger us , balloon angioplasty of the peroneal artery with a 2.5 mm balloon and proximal bypass graft stenting into the common femoral artery with a 6 x 60 Eluvia  Recent angiography by Dr. Edgardo Goodwill on 10/30/2023 revealed occluded right SFA, right popliteal, and right anterior tibial artery.  Right CFA-PT bypass with PTFE was recommended.  Other pertinent history includes former smoker (quit 1996), Alzheimer's dementia, RLL lung nodule, COPD maintained on Combivent, GERD on PPI, hearing loss.  DM2 listed in patient history, he denies this, he is not on any anti-DM medications.  A1c 5.4 on preop labs.  Patient reports last dose Eliquis  11/16/2023 and last dose Plavix  11/14/2023.  Preop labs reviewed, moderate hypokalemia potassium 2.8, creatinine mildly elevated 1.37, moderate hypercalcemia with calcium  7.8, otherwise unremarkable.  Potassium result was called to surgeons office.  Will check i-STAT on day of surgery.  EKG 10/28/2023: Sinus rhythm with Premature supraventricular complexes.  Rate 72. Right bundle branch block  Pulmonary function tests 02/22/2020: FVC: 2.56 (65.7) FEV1: 1.50 (54.0) - No BD FEV1/FVC: 59 TLC: 4.88 (70.7) RV: 2.32 (83) DLCO: 11.87 (50.2)   14-day event monitor 02/2023: SUMMARY: Patient had a min HR of 42 bpm, max HR of 193 bpm, and avg HR of 65  bpm. Predominant underlying rhythm was Sinus Rhythm. Bundle Branch Block/IVCD was present. 1 run of Ventricular Tachycardia occurred lasting 4  beats with a max rate of 187  bpm (avg 179 bpm). 245 Supraventricular Tachycardia runs occurred, the run with the fastest interval lasting 4 beats  with a max rate of 193 bpm, the longest lasting 12.2 secs with an avg rate of 116 bpm. Isolated SVEs were occasional 1.2%, SVE Couplets and SVE  Triplets were rare. Ventricular Bigeminy and Trigeminy were present.  Cath and PCI 10/17/2022: * Mid LAD lesion is 40% stenosed. * Mid Cx lesion is 40% stenosed. * Prox LAD lesion is 35% stenosed. * Prox RCA lesion is 95% stenosed. * Mid RCA lesion is 35% stenosed. * Dist RCA lesion is 40% stenosed.  TTE 09/2022: * Left Ventricle: Systolic function is low normal. EF: 50-55%. Quantitative  analysis of left ventricular Global Longitudinal Strain (GLS) imaging is  -11.900%. Ejection fraction measured by 3D is 46%, * Left Ventricle: There is mild concentric hypertrophy. * Left Ventricle: The base of the posterior wall is akinetic. The base and the  midportion of the inferior wall  are akinetic. * Aortic Valve: Mild aortic valve regurgitation. * Mitral Valve: There is mild regurgitation. * IVC/SVC: The inferior vena cava demonstrates a diameter of <=2.1 cm and  collapses <50%; therefore, the right atrial pressure is estimated at 8 mmHg.  )        Anesthesia Quick Evaluation

## 2023-11-20 ENCOUNTER — Inpatient Hospital Stay (HOSPITAL_COMMUNITY): Admitting: Physician Assistant

## 2023-11-20 ENCOUNTER — Encounter (HOSPITAL_COMMUNITY): Payer: Self-pay | Admitting: Vascular Surgery

## 2023-11-20 ENCOUNTER — Encounter (HOSPITAL_COMMUNITY)
Admission: RE | Disposition: A | Discharge status: Home / Self Care | Payer: Self-pay | Source: Ambulatory Visit | Attending: Vascular Surgery

## 2023-11-20 ENCOUNTER — Other Ambulatory Visit: Payer: Self-pay

## 2023-11-20 ENCOUNTER — Inpatient Hospital Stay (HOSPITAL_COMMUNITY)

## 2023-11-20 ENCOUNTER — Inpatient Hospital Stay (HOSPITAL_COMMUNITY)
Admission: RE | Admit: 2023-11-20 | Discharge: 2023-11-24 | DRG: 253 | Disposition: A | Discharge status: Home / Self Care | Source: Ambulatory Visit | Attending: Vascular Surgery | Admitting: Vascular Surgery

## 2023-11-20 DIAGNOSIS — J449 Chronic obstructive pulmonary disease, unspecified: Secondary | ICD-10-CM | POA: Diagnosis present

## 2023-11-20 DIAGNOSIS — I2510 Atherosclerotic heart disease of native coronary artery without angina pectoris: Secondary | ICD-10-CM

## 2023-11-20 DIAGNOSIS — D62 Acute posthemorrhagic anemia: Secondary | ICD-10-CM | POA: Diagnosis not present

## 2023-11-20 DIAGNOSIS — E785 Hyperlipidemia, unspecified: Secondary | ICD-10-CM | POA: Diagnosis present

## 2023-11-20 DIAGNOSIS — T82856A Stenosis of peripheral vascular stent, initial encounter: Secondary | ICD-10-CM

## 2023-11-20 DIAGNOSIS — Z888 Allergy status to other drugs, medicaments and biological substances status: Secondary | ICD-10-CM

## 2023-11-20 DIAGNOSIS — Z981 Arthrodesis status: Secondary | ICD-10-CM | POA: Diagnosis not present

## 2023-11-20 DIAGNOSIS — Z885 Allergy status to narcotic agent status: Secondary | ICD-10-CM

## 2023-11-20 DIAGNOSIS — I509 Heart failure, unspecified: Secondary | ICD-10-CM | POA: Diagnosis not present

## 2023-11-20 DIAGNOSIS — Z8673 Personal history of transient ischemic attack (TIA), and cerebral infarction without residual deficits: Secondary | ICD-10-CM | POA: Diagnosis not present

## 2023-11-20 DIAGNOSIS — E119 Type 2 diabetes mellitus without complications: Secondary | ICD-10-CM

## 2023-11-20 DIAGNOSIS — T82858A Stenosis of vascular prosthetic devices, implants and grafts, initial encounter: Secondary | ICD-10-CM

## 2023-11-20 DIAGNOSIS — Z86718 Personal history of other venous thrombosis and embolism: Secondary | ICD-10-CM | POA: Diagnosis not present

## 2023-11-20 DIAGNOSIS — I129 Hypertensive chronic kidney disease with stage 1 through stage 4 chronic kidney disease, or unspecified chronic kidney disease: Secondary | ICD-10-CM | POA: Diagnosis present

## 2023-11-20 DIAGNOSIS — R531 Weakness: Secondary | ICD-10-CM | POA: Diagnosis present

## 2023-11-20 DIAGNOSIS — I70221 Atherosclerosis of native arteries of extremities with rest pain, right leg: Secondary | ICD-10-CM | POA: Diagnosis present

## 2023-11-20 DIAGNOSIS — I110 Hypertensive heart disease with heart failure: Secondary | ICD-10-CM

## 2023-11-20 DIAGNOSIS — E1151 Type 2 diabetes mellitus with diabetic peripheral angiopathy without gangrene: Secondary | ICD-10-CM | POA: Diagnosis present

## 2023-11-20 DIAGNOSIS — Z8249 Family history of ischemic heart disease and other diseases of the circulatory system: Secondary | ICD-10-CM | POA: Diagnosis not present

## 2023-11-20 DIAGNOSIS — Z955 Presence of coronary angioplasty implant and graft: Secondary | ICD-10-CM | POA: Diagnosis not present

## 2023-11-20 DIAGNOSIS — Z7902 Long term (current) use of antithrombotics/antiplatelets: Secondary | ICD-10-CM

## 2023-11-20 DIAGNOSIS — Z961 Presence of intraocular lens: Secondary | ICD-10-CM | POA: Diagnosis present

## 2023-11-20 DIAGNOSIS — I708 Atherosclerosis of other arteries: Secondary | ICD-10-CM | POA: Diagnosis present

## 2023-11-20 DIAGNOSIS — Z87891 Personal history of nicotine dependence: Secondary | ICD-10-CM | POA: Diagnosis not present

## 2023-11-20 DIAGNOSIS — N1831 Chronic kidney disease, stage 3a: Secondary | ICD-10-CM | POA: Diagnosis present

## 2023-11-20 DIAGNOSIS — I480 Paroxysmal atrial fibrillation: Secondary | ICD-10-CM | POA: Diagnosis present

## 2023-11-20 DIAGNOSIS — Z79899 Other long term (current) drug therapy: Secondary | ICD-10-CM

## 2023-11-20 DIAGNOSIS — Z9841 Cataract extraction status, right eye: Secondary | ICD-10-CM | POA: Diagnosis not present

## 2023-11-20 DIAGNOSIS — E1122 Type 2 diabetes mellitus with diabetic chronic kidney disease: Secondary | ICD-10-CM | POA: Diagnosis present

## 2023-11-20 DIAGNOSIS — Z9842 Cataract extraction status, left eye: Secondary | ICD-10-CM

## 2023-11-20 DIAGNOSIS — M79674 Pain in right toe(s): Secondary | ICD-10-CM | POA: Diagnosis present

## 2023-11-20 DIAGNOSIS — I252 Old myocardial infarction: Secondary | ICD-10-CM

## 2023-11-20 DIAGNOSIS — K219 Gastro-esophageal reflux disease without esophagitis: Secondary | ICD-10-CM | POA: Diagnosis present

## 2023-11-20 DIAGNOSIS — Z95828 Presence of other vascular implants and grafts: Principal | ICD-10-CM

## 2023-11-20 DIAGNOSIS — Z7901 Long term (current) use of anticoagulants: Secondary | ICD-10-CM

## 2023-11-20 LAB — CBC
HCT: 36.8 % — ABNORMAL LOW (ref 39.0–52.0)
Hemoglobin: 12.1 g/dL — ABNORMAL LOW (ref 13.0–17.0)
MCH: 31.6 pg (ref 26.0–34.0)
MCHC: 32.9 g/dL (ref 30.0–36.0)
MCV: 96.1 fL (ref 80.0–100.0)
Platelets: 216 10*3/uL (ref 150–400)
RBC: 3.83 MIL/uL — ABNORMAL LOW (ref 4.22–5.81)
RDW: 12.1 % (ref 11.5–15.5)
WBC: 10.9 10*3/uL — ABNORMAL HIGH (ref 4.0–10.5)
nRBC: 0 % (ref 0.0–0.2)

## 2023-11-20 LAB — POCT I-STAT, CHEM 8
BUN: 21 mg/dL (ref 8–23)
Calcium, Ion: 0.99 mmol/L — ABNORMAL LOW (ref 1.15–1.40)
Chloride: 103 mmol/L (ref 98–111)
Creatinine, Ser: 1.3 mg/dL — ABNORMAL HIGH (ref 0.61–1.24)
Glucose, Bld: 109 mg/dL — ABNORMAL HIGH (ref 70–99)
HCT: 40 % (ref 39.0–52.0)
Hemoglobin: 13.6 g/dL (ref 13.0–17.0)
Potassium: 3.5 mmol/L (ref 3.5–5.1)
Sodium: 140 mmol/L (ref 135–145)
TCO2: 30 mmol/L (ref 22–32)

## 2023-11-20 LAB — CREATININE, SERUM
Creatinine, Ser: 1.29 mg/dL — ABNORMAL HIGH (ref 0.61–1.24)
GFR, Estimated: 55 mL/min — ABNORMAL LOW (ref >60)

## 2023-11-20 LAB — GLUCOSE, CAPILLARY
Glucose-Capillary: 124 mg/dL — ABNORMAL HIGH (ref 70–99)
Glucose-Capillary: 128 mg/dL — ABNORMAL HIGH (ref 70–99)
Glucose-Capillary: 155 mg/dL — ABNORMAL HIGH (ref 70–99)

## 2023-11-20 LAB — PREPARE RBC (CROSSMATCH)

## 2023-11-20 SURGERY — BYPASS GRAFT FEMORAL-POPLITEAL ARTERY
Anesthesia: General | Site: Groin | Laterality: Right

## 2023-11-20 MED ORDER — ORAL CARE MOUTH RINSE: 15.0000 mL | Freq: Once | OROMUCOSAL | Status: AC

## 2023-11-20 MED ORDER — ONDANSETRON HCL 4 MG/2ML IJ SOLN
4.0000 mg | Freq: Four times a day (QID) | INTRAMUSCULAR | Status: DC | PRN
  Administered 2023-11-23: 4 mg via INTRAVENOUS
  Filled 2023-11-20: qty 2

## 2023-11-20 MED ORDER — HEPARIN SODIUM (PORCINE) 1000 UNIT/ML IJ SOLN
INTRAMUSCULAR | Status: DC | PRN
  Administered 2023-11-20: 1000 [IU] via INTRAVENOUS
  Administered 2023-11-20: 8000 [IU] via INTRAVENOUS
  Administered 2023-11-20: 4000 [IU] via INTRAVENOUS

## 2023-11-20 MED ORDER — HEPARIN SODIUM (PORCINE) 5000 UNIT/ML IJ SOLN
5000.0000 [IU] | Freq: Three times a day (TID) | INTRAMUSCULAR | Status: DC
  Administered 2023-11-21 – 2023-11-22 (×4): 5000 [IU] via SUBCUTANEOUS
  Filled 2023-11-20 (×4): qty 1

## 2023-11-20 MED ORDER — ALBUMIN HUMAN 5 % IV SOLN: INTRAVENOUS | Status: DC | PRN

## 2023-11-20 MED ORDER — HYDRALAZINE HCL 20 MG/ML IJ SOLN: 5.0000 mg | INTRAMUSCULAR | Status: DC | PRN

## 2023-11-20 MED ORDER — HEPARIN 6000 UNIT IRRIGATION SOLUTION
Status: AC
  Filled 2023-11-20: qty 500

## 2023-11-20 MED ORDER — DOCUSATE SODIUM 100 MG PO CAPS
100.0000 mg | ORAL_CAPSULE | Freq: Every day | ORAL | Status: DC
  Administered 2023-11-21 – 2023-11-24 (×3): 100 mg via ORAL
  Filled 2023-11-20 (×4): qty 1

## 2023-11-20 MED ORDER — RAMIPRIL 5 MG PO CAPS
10.0000 mg | ORAL_CAPSULE | Freq: Two times a day (BID) | ORAL | Status: DC
  Administered 2023-11-21 – 2023-11-24 (×7): 10 mg via ORAL
  Filled 2023-11-20 (×8): qty 2

## 2023-11-20 MED ORDER — FENTANYL CITRATE (PF) 250 MCG/5ML IJ SOLN
INTRAMUSCULAR | Status: AC
  Filled 2023-11-20: qty 5

## 2023-11-20 MED ORDER — FERROUS SULFATE 325 (65 FE) MG PO TABS
325.0000 mg | ORAL_TABLET | ORAL | Status: DC
  Administered 2023-11-21 – 2023-11-23 (×2): 325 mg via ORAL
  Filled 2023-11-20 (×2): qty 1

## 2023-11-20 MED ORDER — MEMANTINE HCL 10 MG PO TABS
20.0000 mg | ORAL_TABLET | Freq: Every day | ORAL | Status: DC
  Administered 2023-11-20 – 2023-11-23 (×4): 20 mg via ORAL
  Filled 2023-11-20 (×4): qty 2

## 2023-11-20 MED ORDER — PROPOFOL 10 MG/ML IV BOLUS
INTRAVENOUS | Status: AC
  Filled 2023-11-20: qty 20

## 2023-11-20 MED ORDER — HYDROMORPHONE HCL 1 MG/ML IJ SOLN
INTRAMUSCULAR | Status: DC | PRN
  Administered 2023-11-20 (×2): .25 mg via INTRAVENOUS

## 2023-11-20 MED ORDER — CLEVIDIPINE BUTYRATE 0.5 MG/ML IV EMUL
0.0000 mg/h | INTRAVENOUS | Status: DC
  Administered 2023-11-20: 2 mg/h via INTRAVENOUS

## 2023-11-20 MED ORDER — ALBUTEROL SULFATE (2.5 MG/3ML) 0.083% IN NEBU: 2.5000 mg | INHALATION_SOLUTION | Freq: Four times a day (QID) | RESPIRATORY_TRACT | Status: DC | PRN

## 2023-11-20 MED ORDER — LABETALOL HCL 5 MG/ML IV SOLN
10.0000 mg | INTRAVENOUS | Status: DC | PRN
  Administered 2023-11-22 – 2023-11-23 (×2): 10 mg via INTRAVENOUS
  Filled 2023-11-20 (×3): qty 4

## 2023-11-20 MED ORDER — PHENYLEPHRINE HCL-NACL 20-0.9 MG/250ML-% IV SOLN
INTRAVENOUS | Status: DC | PRN
  Administered 2023-11-20: 25 ug/min via INTRAVENOUS

## 2023-11-20 MED ORDER — FENTANYL CITRATE (PF) 250 MCG/5ML IJ SOLN
INTRAMUSCULAR | Status: DC | PRN
Start: 2023-11-20 — End: 2023-11-20
  Administered 2023-11-20: 50 ug via INTRAVENOUS
  Administered 2023-11-20: 100 ug via INTRAVENOUS
  Administered 2023-11-20 (×2): 50 ug via INTRAVENOUS

## 2023-11-20 MED ORDER — SODIUM CHLORIDE 0.9 % IV SOLN
INTRAVENOUS | Status: AC
Start: 2023-11-20 — End: 2023-11-21

## 2023-11-20 MED ORDER — CHLORHEXIDINE GLUCONATE CLOTH 2 % EX PADS: 6.0000 | MEDICATED_PAD | Freq: Once | CUTANEOUS | Status: DC

## 2023-11-20 MED ORDER — POTASSIUM CHLORIDE CRYS ER 20 MEQ PO TBCR: 20.0000 meq | EXTENDED_RELEASE_TABLET | Freq: Every day | ORAL | Status: DC | PRN

## 2023-11-20 MED ORDER — 0.9 % SODIUM CHLORIDE (POUR BTL) OPTIME
TOPICAL | Status: DC | PRN
  Administered 2023-11-20: 2000 mL

## 2023-11-20 MED ORDER — EZETIMIBE 10 MG PO TABS
10.0000 mg | ORAL_TABLET | Freq: Every evening | ORAL | Status: DC
  Administered 2023-11-20 – 2023-11-23 (×4): 10 mg via ORAL
  Filled 2023-11-20 (×4): qty 1

## 2023-11-20 MED ORDER — NOREPINEPHRINE 4 MG/250ML-% IV SOLN
INTRAVENOUS | Status: AC
Start: 2023-11-20 — End: ?
  Filled 2023-11-20: qty 250

## 2023-11-20 MED ORDER — ASPIRIN 300 MG RE SUPP
RECTAL | Status: DC | PRN
  Administered 2023-11-20: 300 mg via RECTAL

## 2023-11-20 MED ORDER — ACETAMINOPHEN 650 MG RE SUPP: 325.0000 mg | RECTAL | Status: DC | PRN

## 2023-11-20 MED ORDER — LACTATED RINGERS IV SOLN: INTRAVENOUS | Status: DC

## 2023-11-20 MED ORDER — METOPROLOL TARTRATE 50 MG PO TABS
50.0000 mg | ORAL_TABLET | Freq: Two times a day (BID) | ORAL | Status: DC
  Administered 2023-11-20 – 2023-11-24 (×8): 50 mg via ORAL
  Filled 2023-11-20 (×8): qty 1

## 2023-11-20 MED ORDER — PROPOFOL 1000 MG/100ML IV EMUL
INTRAVENOUS | Status: AC
  Filled 2023-11-20: qty 100

## 2023-11-20 MED ORDER — SODIUM CHLORIDE 0.9 % IV SOLN
500.0000 mL | Freq: Once | INTRAVENOUS | Status: DC | PRN
Start: 2023-11-20 — End: 2023-11-24

## 2023-11-20 MED ORDER — HEMOSTATIC AGENTS (NO CHARGE) OPTIME
TOPICAL | Status: DC | PRN
  Administered 2023-11-20: 5 via TOPICAL

## 2023-11-20 MED ORDER — FINASTERIDE 5 MG PO TABS
5.0000 mg | ORAL_TABLET | Freq: Every day | ORAL | Status: DC
  Administered 2023-11-21 – 2023-11-24 (×4): 5 mg via ORAL
  Filled 2023-11-20 (×4): qty 1

## 2023-11-20 MED ORDER — ACETAMINOPHEN 325 MG PO TABS: 325.0000 mg | ORAL_TABLET | ORAL | Status: DC | PRN

## 2023-11-20 MED ORDER — GUAIFENESIN-DM 100-10 MG/5ML PO SYRP: 15.0000 mL | ORAL_SOLUTION | ORAL | Status: DC | PRN

## 2023-11-20 MED ORDER — ALUM & MAG HYDROXIDE-SIMETH 200-200-20 MG/5ML PO SUSP: 15.0000 mL | ORAL | Status: DC | PRN

## 2023-11-20 MED ORDER — CEFAZOLIN SODIUM-DEXTROSE 2-4 GM/100ML-% IV SOLN
2.0000 g | INTRAVENOUS | Status: AC
  Administered 2023-11-20: 2 g via INTRAVENOUS

## 2023-11-20 MED ORDER — DEXAMETHASONE SODIUM PHOSPHATE 10 MG/ML IJ SOLN
INTRAMUSCULAR | Status: DC | PRN
  Administered 2023-11-20: 10 mg via INTRAVENOUS

## 2023-11-20 MED ORDER — PROPOFOL 500 MG/50ML IV EMUL
INTRAVENOUS | Status: DC | PRN
  Administered 2023-11-20: 45 ug/kg/min via INTRAVENOUS

## 2023-11-20 MED ORDER — PROTAMINE SULFATE 10 MG/ML IV SOLN
INTRAVENOUS | Status: DC | PRN
Start: 2023-11-20 — End: 2023-11-20
  Administered 2023-11-20: 30 mg via INTRAVENOUS
  Administered 2023-11-20: 10 mg via INTRAVENOUS

## 2023-11-20 MED ORDER — VITAMIN D 25 MCG (1000 UNIT) PO TABS
2000.0000 [IU] | ORAL_TABLET | ORAL | Status: DC
  Administered 2023-11-21 – 2023-11-23 (×2): 2000 [IU] via ORAL
  Filled 2023-11-20 (×2): qty 2

## 2023-11-20 MED ORDER — LIDOCAINE 2% (20 MG/ML) 5 ML SYRINGE
INTRAMUSCULAR | Status: DC | PRN
  Administered 2023-11-20: 80 mg via INTRAVENOUS

## 2023-11-20 MED ORDER — SURGIFLO WITH THROMBIN (HEMOSTATIC MATRIX KIT) OPTIME
TOPICAL | Status: DC | PRN
Start: 2023-11-20 — End: 2023-11-20
  Administered 2023-11-20: 1 via TOPICAL

## 2023-11-20 MED ORDER — HYDROMORPHONE HCL 1 MG/ML IJ SOLN
0.5000 mg | INTRAMUSCULAR | Status: DC | PRN
  Administered 2023-11-21: 1 mg via INTRAVENOUS
  Filled 2023-11-20: qty 1

## 2023-11-20 MED ORDER — CLEVIDIPINE BUTYRATE 0.5 MG/ML IV EMUL
INTRAVENOUS | Status: AC
  Filled 2023-11-20: qty 100

## 2023-11-20 MED ORDER — SODIUM CHLORIDE 0.9 % IV SOLN: INTRAVENOUS | Status: DC

## 2023-11-20 MED ORDER — CHLORHEXIDINE GLUCONATE 0.12 % MT SOLN
OROMUCOSAL | Status: AC
  Administered 2023-11-20: 15 mL via OROMUCOSAL
  Filled 2023-11-20: qty 15

## 2023-11-20 MED ORDER — CEFAZOLIN SODIUM-DEXTROSE 2-4 GM/100ML-% IV SOLN
INTRAVENOUS | Status: AC
  Filled 2023-11-20: qty 100

## 2023-11-20 MED ORDER — PROPOFOL 10 MG/ML IV BOLUS
INTRAVENOUS | Status: DC | PRN
  Administered 2023-11-20: 100 mg via INTRAVENOUS

## 2023-11-20 MED ORDER — CALCIUM CHLORIDE 10 % IV SOLN
INTRAVENOUS | Status: DC | PRN
  Administered 2023-11-20: 250 mg via INTRAVENOUS

## 2023-11-20 MED ORDER — CHLORHEXIDINE GLUCONATE 0.12 % MT SOLN: 15.0000 mL | Freq: Once | OROMUCOSAL | Status: AC

## 2023-11-20 MED ORDER — CEFAZOLIN SODIUM-DEXTROSE 2-4 GM/100ML-% IV SOLN
2.0000 g | Freq: Three times a day (TID) | INTRAVENOUS | Status: AC
  Administered 2023-11-20 (×2): 2 g via INTRAVENOUS
  Filled 2023-11-20 (×2): qty 100

## 2023-11-20 MED ORDER — INSULIN ASPART 100 UNIT/ML IJ SOLN: 0.0000 [IU] | INTRAMUSCULAR | Status: DC | PRN

## 2023-11-20 MED ORDER — CLOPIDOGREL BISULFATE 75 MG PO TABS
75.0000 mg | ORAL_TABLET | Freq: Every day | ORAL | Status: DC
  Administered 2023-11-21 – 2023-11-24 (×4): 75 mg via ORAL
  Filled 2023-11-20 (×5): qty 1

## 2023-11-20 MED ORDER — TRAMADOL HCL 50 MG PO TABS
50.0000 mg | ORAL_TABLET | Freq: Four times a day (QID) | ORAL | Status: DC | PRN
  Administered 2023-11-21 – 2023-11-23 (×5): 50 mg via ORAL
  Filled 2023-11-20 (×5): qty 1

## 2023-11-20 MED ORDER — ROCURONIUM BROMIDE 10 MG/ML (PF) SYRINGE
PREFILLED_SYRINGE | INTRAVENOUS | Status: DC | PRN
  Administered 2023-11-20: 70 mg via INTRAVENOUS
  Administered 2023-11-20 (×2): 20 mg via INTRAVENOUS

## 2023-11-20 MED ORDER — POLYETHYLENE GLYCOL 3350 17 G PO PACK: 17.0000 g | PACK | Freq: Every day | ORAL | Status: DC | PRN

## 2023-11-20 MED ORDER — ACETAMINOPHEN 10 MG/ML IV SOLN
INTRAVENOUS | Status: DC | PRN
  Administered 2023-11-20: 1000 mg via INTRAVENOUS

## 2023-11-20 MED ORDER — MAGNESIUM SULFATE 2 GM/50ML IV SOLN: 2.0000 g | Freq: Every day | INTRAVENOUS | Status: DC | PRN

## 2023-11-20 MED ORDER — HYDROMORPHONE HCL 1 MG/ML IJ SOLN
INTRAMUSCULAR | Status: AC
Start: 2023-11-20 — End: ?
  Filled 2023-11-20: qty 0.5

## 2023-11-20 MED ORDER — PHENOL 1.4 % MT LIQD: 1.0000 | OROMUCOSAL | Status: DC | PRN

## 2023-11-20 MED ORDER — SUGAMMADEX SODIUM 200 MG/2ML IV SOLN: INTRAVENOUS | Status: DC | PRN

## 2023-11-20 MED ORDER — BISACODYL 5 MG PO TBEC: 5.0000 mg | DELAYED_RELEASE_TABLET | Freq: Every day | ORAL | Status: DC | PRN

## 2023-11-20 MED ORDER — HEPARIN 6000 UNIT IRRIGATION SOLUTION
Status: DC | PRN
  Administered 2023-11-20: 1

## 2023-11-20 MED ORDER — PANTOPRAZOLE SODIUM 40 MG PO TBEC
40.0000 mg | DELAYED_RELEASE_TABLET | Freq: Every day | ORAL | Status: DC
  Administered 2023-11-20 – 2023-11-24 (×5): 40 mg via ORAL
  Filled 2023-11-20 (×5): qty 1

## 2023-11-20 MED ORDER — PHENYLEPHRINE HCL-NACL 20-0.9 MG/250ML-% IV SOLN
INTRAVENOUS | Status: AC
  Filled 2023-11-20: qty 250

## 2023-11-20 MED ORDER — RIVASTIGMINE TARTRATE 1.5 MG PO CAPS
6.0000 mg | ORAL_CAPSULE | Freq: Two times a day (BID) | ORAL | Status: DC
  Administered 2023-11-20 – 2023-11-24 (×8): 6 mg via ORAL
  Filled 2023-11-20 (×10): qty 4

## 2023-11-20 MED ORDER — SUGAMMADEX SODIUM 200 MG/2ML IV SOLN
INTRAVENOUS | Status: DC | PRN
  Administered 2023-11-20: 200 mg via INTRAVENOUS

## 2023-11-20 MED ORDER — ONDANSETRON HCL 4 MG/2ML IJ SOLN
INTRAMUSCULAR | Status: DC | PRN
  Administered 2023-11-20: 4 mg via INTRAVENOUS

## 2023-11-20 MED ORDER — HYDROCHLOROTHIAZIDE 12.5 MG PO TABS
12.5000 mg | ORAL_TABLET | Freq: Every day | ORAL | Status: DC
  Administered 2023-11-21 – 2023-11-24 (×4): 12.5 mg via ORAL
  Filled 2023-11-20 (×4): qty 1

## 2023-11-20 MED ORDER — ACETAMINOPHEN 10 MG/ML IV SOLN
INTRAVENOUS | Status: AC
Start: 2023-11-20 — End: ?
  Filled 2023-11-20: qty 100

## 2023-11-20 MED ORDER — METOPROLOL TARTRATE 5 MG/5ML IV SOLN: 2.0000 mg | INTRAVENOUS | Status: DC | PRN

## 2023-11-20 MED ORDER — CLEVIDIPINE BUTYRATE 0.5 MG/ML IV EMUL
INTRAVENOUS | Status: AC
  Filled 2023-11-20: qty 50

## 2023-11-20 MED ORDER — IODIXANOL 320 MG/ML IV SOLN
INTRAVENOUS | Status: DC | PRN
Start: 2023-11-20 — End: 2023-11-20
  Administered 2023-11-20: 22 mL via INTRA_ARTERIAL

## 2023-11-20 MED ORDER — SODIUM CHLORIDE 0.9 % IV SOLN: INTRAVENOUS | Status: DC | PRN

## 2023-11-20 SURGICAL SUPPLY — 74 items
BAG COUNTER SPONGE SURGICOUNT (BAG) ×2 IMPLANT
BANDAGE ESMARK 6X9 LF (GAUZE/BANDAGES/DRESSINGS) ×1 IMPLANT
BLADE CLIPPER SURG (BLADE) ×2 IMPLANT
CANISTER SUCTION 3000ML PPV (SUCTIONS) ×2 IMPLANT
CATH BEACON 5 .035 65 KMP TIP (CATHETERS) IMPLANT
CATH EMB 3FR 40 (CATHETERS) ×1 IMPLANT
CATH OMNI FLUSH 5F 65CM (CATHETERS) ×1 IMPLANT
CHLORAPREP W/TINT 26 (MISCELLANEOUS) ×1 IMPLANT
CLIP FOGARTY SPRING 6M (CLIP) IMPLANT
CLIP TI MEDIUM 24 (CLIP) ×2 IMPLANT
CLIP TI MEDIUM 6 (CLIP) ×1 IMPLANT
CLIP TI WIDE RED SMALL 24 (CLIP) ×2 IMPLANT
CLIP TI WIDE RED SMALL 6 (CLIP) ×1 IMPLANT
COVER PROBE W GEL 5X96 (DRAPES) ×2 IMPLANT
CUFF TOURN SGL QUICK 42 (TOURNIQUET CUFF) IMPLANT
CUFF TRNQT CYL 24X4X16.5-23 (TOURNIQUET CUFF) ×1 IMPLANT
CUFF TRNQT CYL 34X4.125X (TOURNIQUET CUFF) IMPLANT
DCB IN.PACT 7X40 (BALLOONS) ×1 IMPLANT
DERMABOND ADVANCED .7 DNX12 (GAUZE/BANDAGES/DRESSINGS) ×3 IMPLANT
DERMABOND ADVANCED .7 DNX6 (GAUZE/BANDAGES/DRESSINGS) ×1 IMPLANT
DRAPE C-ARM 42X72 X-RAY (DRAPES) IMPLANT
DRESSING MORCELLS FINE 1000 (Tissue) ×1 IMPLANT
DRESSING PEEL AND PLC PRVNA 13 (GAUZE/BANDAGES/DRESSINGS) ×1 IMPLANT
DRSG COVADERM 4X8 (GAUZE/BANDAGES/DRESSINGS) ×1 IMPLANT
ELECTRODE REM PT RTRN 9FT ADLT (ELECTROSURGICAL) ×2 IMPLANT
EVACUATOR SILICONE 100CC (DRAIN) IMPLANT
GLOVE BIOGEL PI IND STRL 8 (GLOVE) ×3 IMPLANT
GOWN STRL REUS W/ TWL LRG LVL3 (GOWN DISPOSABLE) ×6 IMPLANT
GOWN STRL REUS W/TWL 2XL LVL3 (GOWN DISPOSABLE) ×4 IMPLANT
GRAFT PROPATEN W/RING 6X80X60 (Vascular Products) ×1 IMPLANT
HEMOSTAT SNOW SURGICEL 2X4 (HEMOSTASIS) ×5 IMPLANT
INSERT FOGARTY SM (MISCELLANEOUS) IMPLANT
KIT BASIN OR (CUSTOM PROCEDURE TRAY) ×2 IMPLANT
KIT ENCORE 26 ADVANTAGE (KITS) ×1 IMPLANT
KIT TURNOVER KIT B (KITS) ×2 IMPLANT
NS IRRIG 1000ML POUR BTL (IV SOLUTION) ×4 IMPLANT
PACK ENDO MINOR (CUSTOM PROCEDURE TRAY) ×1 IMPLANT
PACK PERIPHERAL VASCULAR (CUSTOM PROCEDURE TRAY) ×2 IMPLANT
PAD ARMBOARD POSITIONER FOAM (MISCELLANEOUS) ×4 IMPLANT
SET MICROPUNCTURE 5F STIFF (MISCELLANEOUS) ×1 IMPLANT
SET WALTER ACTIVATION W/DRAPE (SET/KITS/TRAYS/PACK) ×1 IMPLANT
SHEATH PINNACLE 5F 10CM (SHEATH) ×1 IMPLANT
SHEATH PINNACLE 8F 10CM (SHEATH) ×1 IMPLANT
SHEATH PINNACLE R/O II 6F 4CM (SHEATH) ×1 IMPLANT
SHEATH PINNACLE R/O II 7F 4CM (SHEATH) ×1 IMPLANT
SPONGE T-LAP 18X18 ~~LOC~~+RFID (SPONGE) ×1 IMPLANT
STAPLER SKIN PROX 35W (STAPLE) ×1 IMPLANT
STOPCOCK 4 WAY LG BORE MALE ST (IV SETS) ×1 IMPLANT
SURGIFLO W/THROMBIN 8M KIT (HEMOSTASIS) ×1 IMPLANT
SUT ETHILON 3 0 PS 1 (SUTURE) IMPLANT
SUT MNCRL AB 4-0 PS2 18 (SUTURE) ×5 IMPLANT
SUT PROLENE 5 0 C 1 24 (SUTURE) ×8 IMPLANT
SUT PROLENE 6 0 BV (SUTURE) ×13 IMPLANT
SUT PROLENE 7 0 BV 1 (SUTURE) IMPLANT
SUT SILK 0 TIES 10X30 (SUTURE) ×1 IMPLANT
SUT SILK 2 0 PERMA HAND 18 BK (SUTURE) IMPLANT
SUT SILK 3-0 18XBRD TIE 12 (SUTURE) ×1 IMPLANT
SUT VIC AB 2-0 CT1 TAPERPNT 27 (SUTURE) ×5 IMPLANT
SUT VIC AB 3-0 SH 27X BRD (SUTURE) ×7 IMPLANT
SYR 20ML LL LF (SYRINGE) ×1 IMPLANT
SYR 3ML LL SCALE MARK (SYRINGE) ×1 IMPLANT
SYR BULB IRRIG 60ML STRL (SYRINGE) ×1 IMPLANT
SYR MEDRAD MARK V 150ML (SYRINGE) ×1 IMPLANT
TAPE UMBILICAL 1/8X30 (MISCELLANEOUS) IMPLANT
TOWEL GREEN STERILE (TOWEL DISPOSABLE) ×2 IMPLANT
TRAY FOLEY MTR SLVR 16FR STAT (SET/KITS/TRAYS/PACK) ×2 IMPLANT
TUBING EXTENTION W/L.L. (IV SETS) IMPLANT
TUBING HIGH PRESSURE 120CM (CONNECTOR) IMPLANT
UNDERPAD 30X36 HEAVY ABSORB (UNDERPADS AND DIAPERS) ×2 IMPLANT
WATER STERILE IRR 1000ML POUR (IV SOLUTION) ×2 IMPLANT
WIRE AMPLATZ SS-J .035X180CM (WIRE) IMPLANT
WIRE AMPLATZ SS-J .035X260CM (WIRE) IMPLANT
WIRE BENTSON .035X145CM (WIRE) ×2 IMPLANT
WIRE ROSEN-J .035X260CM (WIRE) ×1 IMPLANT

## 2023-11-20 NOTE — Anesthesia Postprocedure Evaluation (Signed)
 Anesthesia Post Note  Patient: Donald Gordon  Procedure(s) Performed: BYPASS GRAFT RIGHT COMMON FEMORAL- BELOW KNEE POPLITEAL ARTERY USING X 80CM PROPATEN GRAFT (Right: Groin) ANGIOGRAM (Right: Groin) DRUG COATED BALLOON ANGIOPLASTY, RIGHT ILIAC ARTERY (Right: Groin)     Patient location during evaluation: PACU Anesthesia Type: General Level of consciousness: awake and alert Pain management: pain level controlled Vital Signs Assessment: post-procedure vital signs reviewed and stable Respiratory status: spontaneous breathing, nonlabored ventilation, respiratory function stable and patient connected to nasal cannula oxygen Cardiovascular status: blood pressure returned to baseline and stable Postop Assessment: no apparent nausea or vomiting Anesthetic complications: no   No notable events documented.  Last Vitals:  Vitals:   11/20/23 1330 11/20/23 1345  BP: (!) 134/58 (!) 134/56  Pulse: 67 68  Resp: 14 15  Temp:  37.1 C  SpO2: 94% 93%    Last Pain:  Vitals:   11/20/23 1330  TempSrc:   PainSc: 0-No pain                 [name]

## 2023-11-20 NOTE — H&P (Signed)
 [facility] [place] HISTORY AND PHYSICAL   VASCULAR STAFF ADDENDUM: I have independently interviewed and examined the patient. I agree with the above.  In short, Donald Gordon is an 83 year old male with multiple revascularizations to the right lower extremity including multiple bypasses as well as endovascular operations and lysis attempts for limb salvage.  He has also had 4 compartment fasciotomies to the right lower extremity.  [name] has rest pain, and I called [name] yesterday to ensure that his rest pain was bad enough to seek surgery.  I also called his daughter to level about the seriousness of this operation, most notably that this is a redo redo redo groin and that with the prior fasciotomy, exposing the posterior tibial artery will also be very difficult.  There is a chance that I am not able to perform this operation if the artery is not suitable for sewing.  He is aware that a plastic femoral to posterior tibial artery bypass likely has a patency of roughly 6 months.  He also requires retrograde iliac stenting for stenosis of previously placed stent.  I had a long conversation again with [name] this morning.  He is aware that this is a last ditch effort to save the right lower extremity.  When this bypass fails in the future, the only option will be limb loss. Both he and his daughter are aware of the risks and benefits of surgery, and have elected to proceed.  [name] MD [facility] Phone Number: [phone] 11/20/2023 7:22 AM     History of Present Illness:  Patient is a 83 y.o. year old male who presents for evaluation of  Occluded right LE bypass on 09/10/22.  S/P Lysis catheter directed for 2 days by Dr. Rosalva Comber followed by Dr. Sharia Daunt.  He had patent fem-pop bypass with PT/Peroneal inflow to the foot.  The AT was occluded.  He states that 3-4 months ago he started having calf pain on the right LE and his walking  distance has progressively become shorter before he has to stop and rest.  He states a few weeks ago he now has coolness and pain in the right toes.  He denies non healing wounds. He has B LE edema without history of venous insufficiency or CHF.  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    Unknown previous vascular surgery at a different institution, does have right external iliac stents 01/09/2018: Right common femoral to below-knee popliteal artery bypass graft with PTFE, tibial thrombectomy and 4 compartment fasciotomy for ischemia [name]) 01/11/2018: Fasciotomy closure [name]) 07/25/2019: Restenting of previous PTFE bypass graft with stent to the right tibioperoneal trunk, popliteal artery, SFA with two 5x250 Viabahn distally and a 6 x 50 Viabahn proximally [name], rest pain) 08/31/2019: Initiation of thrombolytic therapy for rest pain and occluded stents [name]) 09/01/2019: Penumbra mechanical thrombectomy with stenting of the tibioperoneal trunk using a 5 x 60 Tiger us , balloon angioplasty of the peroneal artery with a 2.5 mm balloon and proximal bypass graft stenting into the common femoral artery with a 6 x 60 Eluvia   The patient has come off anticoagulation in the past which led to bypass graft occlusions.  He was last seen in our office in June 2023 with a moderate stenosis in his distal bypass graft with peak systolic velocity of 180.  His ABI on the right was 0.94.  He had a palpable posterior tibial pulse    Past Medical  History:  Diagnosis Date   Arthritis    "knees, elbows" (03/18/2018)   Chronic edema    a. Chronic RLE edema.   COPD (chronic obstructive pulmonary disease) (HCC)    Coronary artery disease    a. MI s/p balloon 1996, details unclear.   Dysrhythmia    PROXIMAL MARGIN FIBRILATION   GERD (gastroesophageal reflux disease)    Hyperlipidemia    Hypertension    Myocardial infarction (HCC) 1996   PAD (peripheral artery disease) (HCC)    a. s/p stenting 08/2012, 02/2013.    PAF  (paroxysmal atrial fibrillation) (HCC)    Sleep apnea    pt denies ever having sleep study   Stroke [facility]) ~ 2014   denies residual on 03/18/2018   Thrombosis of lower extremity    a. Listed on patient's medical bracelet - at [place].   Type II diabetes mellitus (HCC)     Past Surgical History:  Procedure Laterality Date   ABDOMINAL AORTOGRAM N/A 10/30/2023   Procedure: ABDOMINAL AORTOGRAM;  Surgeon: [name], MD;  [facility] INVAS[facility] CV LAB;  Service: Cardiovascular;  Laterality: N/A;   ABDOMINAL AORTOGRAM W/LOWER EXTREMITY N/A 07/25/2019   Procedure: ABDOMINAL AORTOGRAM W/LOWER EXTREMITY;  Surgeon: [name], MD;  Location: [facility] INVASIVE CV LAB;  Service: Cardiovascular;  Laterality: N/A;   ANGIOPLASTY Right 06/03/2020   Procedure: BALLOON ANGIOPLASTY POPITEAL ARTERY;  Surgeon: [name], MD;  Location: [facility] OR;  Service: Vascular;  Laterality: Right;   ANTERIOR LUMBAR FUSION  1981   APPENDECTOMY     APPLICATION OF WOUND VAC Right 01/09/2018   Procedure: APPLICATION OF WOUND VAC ON RIGHT LOWER LEG;  Surgeon: [name], MD;  Location: [facility] OR;  Service: Vascular;  Laterality: Right;   ATRIAL FIBRILLATION ABLATION N/A 12/07/2020   Procedure: ATRIAL FIBRILLATION ABLATION;  Surgeon: [name], MD;  [facility] INV[facility] LAB;  Service: Cardiovascular;  Laterality: N/A;   BACK SURGERY     BUBBLE STUDY  12/07/2020   Procedure: BUBBLE STUDY;  Surgeon: [name], MD;  Location: [facility] ENDOSCOPY;  Service: Cardiovascular;;   CATARACT EXTRACTION W/ INTRAOCULAR LENS  IMPLANT, BILATERAL Bilateral    CORONARY ANGIOPLASTY WITH STENT PLACEMENT     "I've got 1 stent in there" (03/18/2018)   FASCIOTOMY Right 01/09/2018   Procedure: FASCIOTOMY RIGHT LOWER LEG;  Surgeon: [name], MD;  Location[facility] OR;  Service: Vascular;  Laterality: Right;   FASCIOTOMY Right 06/04/2020   Procedure: RIGHT LOWER EXTREMITY FASCIOTOMY;  Surgeon: Richrd Char, MD;  Location: [facility]  OR;  Service: Vascular;  Laterality: Right;   FASCIOTOMY CLOSURE Right 01/11/2018   Procedure: FASCIOTOMY CLOSURE RIGHT CALF;  Surgeon: [name], MD;  Location: [facility] OR;  Service: Vascular;  Laterality: Right;   FEMORAL-POPLITEAL BYPASS GRAFT Right 01/09/2018   Procedure: RIGHT FEMORAL-POPLITEAL ARTERY BYPASS USING PROPATEN X 80CM VASCULAR GRAFT;  Surgeon: [name], MD;  Location: [facility] OR;  Service: Vascular;  Laterality: Right;   HEMORRHOID SURGERY     LEFT HEART CATH AND CORONARY ANGIOGRAPHY N/A 03/19/2018   Procedure: LEFT HEART CATH AND CORONARY ANGIOGRAPHY;  Surgeon: Swaziland, Peter M, MD;  [facility] INV[facility];  Service: Cardiovascular;  Laterality: N/A;   LOWER EXTREMITY ANGIOGRAM Right 06/02/2020   Procedure: RIGHT LOWER EXTREMITY ANGIOGRAM INTERVENTION;  Surgeon: [name], MD;  Location: [facility] OR;  Service: Vascular;  Laterality: Right;   LOWER EXTREMITY ANGIOGRAM Right 06/03/2020   Procedure: RIGHT  LOWER EXTREMITY ANGIOGRAM from FEMORAL APPROACH;  Surgeon: [name], MD;  Location: [facility] OR;  Service: Vascular;  Laterality: Right;   LOWER EXTREMITY ANGIOGRAPHY N/A 08/31/2019   Procedure: LOWER EXTREMITY ANGIOGRAPHY;  Surgeon: Young Hensen, MD;  [facility];  Service: Cardiovascular;  Laterality: N/A;   LOWER EXTREMITY ANGIOGRAPHY Right 01/18/2020   Procedure: LOWER EXTREMITY ANGIOGRAPHY;  Surgeon: Young Hensen, MD;  [facility];  Service: Cardiovascular;  Laterality: Right;   LOWER EXTREMITY ANGIOGRAPHY N/A 09/11/2022   Procedure: Lower Extremity Angiography;  Surgeon: [name], MD;  Location: [facility] INVASIVE CV LAB;  Service: Cardiovascular;  Laterality: N/A;  With thrombolysis catheter insertion   LOWER EXTREMITY ANGIOGRAPHY Right 10/30/2023   Procedure: Lower Extremity Angiography;  Surgeon: [name], MD;  Location: [facility] [place] [facility] INVASIVE CV LAB;  Service: Cardiovascular;  Laterality: Right;   PATCH ANGIOPLASTY Right  01/09/2018   Procedure: PATCH ANGIOPLASTY USING Corinna Dickens BIOLOGIC 1CM X 6CM PATCH;  Surgeon: [name], MD;  Location: [facility] OR;  Service: Vascular;  Laterality: Right;   PERIPHERAL VASCULAR ATHERECTOMY Right 01/19/2020   Procedure: PERIPHERAL VASCULAR ATHERECTOMY;  Surgeon: Young Hensen, MD;  [facility];  Service: Cardiovascular;  Laterality: Right;  Femoral popliteal and tibioperoneal arteries.   PERIPHERAL VASCULAR BALLOON ANGIOPLASTY Right 09/01/2019   Procedure: PERIPHERAL VASCULAR BALLOON ANGIOPLASTY;  Surgeon: [name], MD;  Location: [facility] INVASIVE CV LAB;  Service: Cardiovascular;  Laterality: Right;  peroneal artery.   PERIPHERAL VASCULAR INTERVENTION Right 07/25/2019   Procedure: PERIPHERAL VASCULAR INTERVENTION;  Surgeon: [name], MD;  Location: [facility] INVASIVE CV LAB;  Service: Cardiovascular;  Laterality: Right;  SFA X 3   PERIPHERAL VASCULAR INTERVENTION Right 09/01/2019   Procedure: PERIPHERAL VASCULAR INTERVENTION;  Surgeon: [name], MD;  Location: [facility] INVASIVE CV LAB;  Service: Cardiovascular;  Laterality: Right;  superficial femoral   PERIPHERAL VASCULAR THROMBECTOMY Right 09/01/2019   Procedure: PERIPHERAL VASCULAR THROMBECTOMY;  Surgeon: [name], MD;  Location: [facility] INVASIVE CV LAB;  Service: Cardiovascular;  Laterality: Right;   PERIPHERAL VASCULAR THROMBECTOMY Right 01/18/2020   Procedure: PERIPHERAL VASCULAR THROMBECTOMY;  Surgeon: Young Hensen, MD;  [facility];  Service: Cardiovascular;  Laterality: Right;   PERIPHERAL VASCULAR THROMBECTOMY N/A 01/19/2020   Procedure: LYSIS RECHECK;  Surgeon: Young Hensen, MD;  [facility];  Service: Cardiovascular;  Laterality: N/A;   PERIPHERAL VASCULAR THROMBECTOMY N/A 09/12/2022   Procedure: LYSIS RECHECK;  Surgeon: [name], MD;  [facility];  Service: Cardiovascular;  Laterality: N/A;    POSTERIOR LUMBAR FUSION  1978   TEE WITHOUT CARDIOVERSION N/A 12/07/2020   Procedure: TRANSESOPHAGEAL ECHOCARDIOGRAM (TEE);  Surgeon: [name], MD;  Location: [facility] ENDOSCOPY;  Service: Cardiovascular;  Laterality: N/A;   THROMBECTOMY FEMORAL ARTERY Right 01/09/2018   Procedure: THROMBECTOMY RIGHT LOWER LEG;  Surgeon: [name], MD;  Location: [facility] OR;  Service: Vascular;  Laterality: Right;   TONSILLECTOMY      ROS:   General:  No weight loss, Fever, chills  HEENT: No recent headaches, no nasal bleeding, no visual changes, no sore throat  Neurologic: No dizziness, blackouts, seizures. No recent symptoms of stroke or mini- stroke. No recent episodes of slurred speech, or temporary blindness.  Cardiac: No recent episodes of chest pain/pressure, no shortness of breath at rest.  No shortness of breath with exertion.  Denies history of atrial fibrillation or  irregular heartbeat  Vascular: No history of rest pain in feet.  Positive recent history of claudication.  No history of non-healing ulcer, No history of DVT   Pulmonary: No home oxygen, no productive cough, no hemoptysis,  No asthma or wheezing  Musculoskeletal:  [ ]  Arthritis, [ ]  Low back pain,  [ ]  Joint pain  Hematologic:No history of hypercoagulable state.  No history of easy bleeding.  No history of anemia  Gastrointestinal: No hematochezia or melena,  No gastroesophageal reflux, no trouble swallowing  Urinary: [ ]  chronic Kidney disease, [ ]  on HD - [ ]  MWF or [ ]  TTHS, [ ]  Burning with urination, [ ]  Frequent urination, [ ]  Difficulty urinating;   Skin: No rashes  Psychological: No history of anxiety,  No history of depression  Social History Social History   Tobacco Use   Smoking status: Former    Current packs/day: 0.00    Average packs/day: 3.0 packs/day for 30.0 years (90.0 ttl pk-yrs)    Types: Cigarettes    Start date: 30    Quit date: 52    Years since quitting: 29.4   Smokeless tobacco: Never   Vaping Use   Vaping status: Never Used  Substance Use Topics   Alcohol use: No   Drug use: Never    Family History Family History  Problem Relation Age of Onset   CAD Mother    CAD Brother    Hypertension Other     Allergies  Allergies  Allergen Reactions   Oxycodone  Nausea And Vomiting   Statins Other (See Comments)    Muscle and Bone pain    Amiodarone      Caused issues and was stopped by MD- possible was the cause for the patient's heart "going in and out of rhythm" multiple times   Donepezil Other (See Comments)    Per [facility] Insomnia   Imipramine Other (See Comments)    Per [facility]   Oxybutynin Chloride Swelling and Other (See Comments)    Per [facility]     Current Facility-Administered Medications  Medication Dose Route Frequency Provider Last Rate Last Admin   0.9 %  sodium chloride  infusion   Intravenous Continuous Tallin Hart E, MD       ceFAZolin  (ANCEF ) 2-4 GM/100ML-% IVPB            ceFAZolin  (ANCEF ) IVPB 2g/100 mL premix  2 g Intravenous 30 min Pre-Op Brach Birdsall E, MD       Chlorhexidine  Gluconate Cloth 2 % PADS 6 each  6 each Topical Once Que Meneely E, MD       And   Chlorhexidine  Gluconate Cloth 2 % PADS 6 each  6 each Topical Once Elder Davidian E, MD       insulin  aspart (novoLOG ) injection 0-7 Units  0-7 Units Subcutaneous Q2H PRN Stoltzfus, Valente Gaskin, DO       lactated ringers  infusion   Intravenous Continuous Jake Mayers, MD        Physical Examination  Vitals:   11/20/23 0545  BP: (!) (P) 193/82  Pulse: (P) 64  Resp: (P) 17  Temp: (P) 98.6 F (37 C)  TempSrc: (P) Oral  SpO2: (P) 96%  Weight: (P) 83.8 kg  Height: (P) 5\' 9"  (1.753 m)    Body mass index is 27.28 kg/m (pended).  General:  Alert and oriented, no acute distress HEENT: Normal Neck: No bruit or JVD Pulmonary: Clear to auscultation bilaterally Cardiac: Regular Rate and Rhythm without murmur Abdomen:  Soft, non-tender, non-distended, no mass, no scars Skin:  No rash, right LE well healed incisions Extremity Pulses:   radial,femoral pulses bilaterally non palpable pedal pulses Musculoskeletal:positive LE edema  Neurologic: Upper and lower extremity motor grossly intact and symmetric  DATA:     ABI Findings:  +--------+------------------+-----+-------------------+--------+  Right  Rt Pressure (mmHg)IndexWaveform           Comment   +--------+------------------+-----+-------------------+--------+  [id number]                                                +--------+------------------+-----+-------------------+--------+  PTA    33                0.18 dampened monophasic          +--------+------------------+-----+-------------------+--------+  DP                            absent                       +--------+------------------+-----+-------------------+--------+   +---------+------------------+-----+----------+-------+  Left    Lt Pressure (mmHg)IndexWaveform  Comment  +---------+------------------+-----+----------+-------+  Brachial 187                                       +---------+------------------+-----+----------+-------+  PTA     126               0.67 monophasic         +---------+------------------+-----+----------+-------+  DP      110               0.59 monophasic         +---------+------------------+-----+----------+-------+  Great Toe100               0.53                    +---------+------------------+-----+----------+-------+   +-------+-----------+-----------+------------+------------+  ABI/TBIToday's ABIToday's TBIPrevious ABIPrevious TBI  +-------+-----------+-----------+------------+------------+  Right 0.18       absent     0.94        0.57          +-------+-----------+-----------+------------+------------+  Left  0.67       0.53       0.98        0.51          +-------+-----------+-----------+------------+------------+          Bilateral ABIs appear decreased compared to prior study on 12/10/2021.    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   Left: Resting left ankle-brachial index indicates moderate left lower  extremity arterial disease. The left toe-brachial index is abnormal.     +----------+--------+-----+--------+----------+--------+  RIGHT    PSV cm/sRatioStenosisWaveform  Comments  +----------+--------+-----+--------+----------+--------+  PTA Distal17                   monophasicdampened  +----------+--------+-----+--------+----------+--------+       Right Graft #1: fem-pop  +------------------+--------+--------+----------+--------------+                   PSV cm/sStenosisWaveform  Comments        +------------------+--------+--------+----------+--------------+  Inflow           120  monophasic                +------------------+--------+--------+----------+--------------+  Prox Anastomosis  48              monophasic                +------------------+--------+--------+----------+--------------+  Proximal Graft    0                                         +------------------+--------+--------+----------+--------------+  Mid Graft         0                                         +------------------+--------+--------+----------+--------------+  Distal Graft                                not visualized  +------------------+--------+--------+----------+--------------+  Distal Anastomosis                          not visualized  +------------------+--------+--------+----------+--------------+  Outflow                                    not visualized  +------------------+--------+--------+----------+--------------+    Summary:  Right: No color or spectral Doppler flow noted within the observed  segments of the femoral popliteal bypass graft   ASSESSMENT/ PLAN:   PAD s/p multiple right LE intervention.  He has an occluded right fem-pop bypass.  He states his claudication symptoms returned about 3-4 months ago and have gotten progressively worse.  He now has right foot toe pain for 2 weeks.  He denies non healing wounds.    The ABI and arterial duplex show an occluded bypass.  DR. Rosalva Comber reviewed the studies and spoke with him.. We have decided that he would benefit from repeat angiogram with right LE runoff and possible intervention.  He is at high risk of limb loss each time we intervene.  He understands this and wishes to proceed. We will schedule him for Wednesday 10/28/23 with Dr. Rosalva Comber.   [name] PA-C [facility] [place] Office: [phone]  MD in clinic [place]

## 2023-11-20 NOTE — Op Note (Signed)
 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  POSTOP DIAGNOSIS:    Same  PROCEDURE:    Redo, redo exposure of the right common femoral artery greater than 30 days Redo exposure of the right posterior tibial artery greater than 30 days Right distal external iliac artery drug-coated balloon angioplasty 7 x 40 mm Right greater saphenous vein saphenectomy Right femoral to posterior tibial artery bypass using 6 mm ringed PTFE Right posterior tibial artery vein patch Myriad morcel biologic placement in the right groin Right groin Prevena vacuum dressing  SURGEON: [name]  ASSIST: Aubry Blase, PA  ANESTHESIA: General  EBL: 250 mL  INDICATIONS:    Donald Gordon is a 83 y.o. male well-known to our service line having undergone multiple interventions to the right lower extremity including above-knee popliteal artery bypass, below-knee popliteal artery bypass, pharmacomechanical thrombolysis, recanalization with stenting.  He presents today with occluded right sided femoral to below-knee popliteal artery bypass.  Unknown when this occurred.  He underwent right lower extremity angiogram demonstrating filling of the posterior tibial artery which continued into the foot.  After discussing risks and benefits of femoral to posterior tibial artery bypass with external iliac artery balloon angioplasty versus stent, [name] elected to proceed.  FINDINGS:   Greater than 70% stenosis of the mid external iliac artery at the proximal portion of previously placed stent Two occluded PTFE bypasses laying on top of each other at the right common femoral artery. The top bypass was resected into the thigh, stents pulled out with old thrombus present.  Bypass filled with antibiotic laden saline and ligated using 0 silk tie. The first bypass was left in situ as the walls  of the artery were difficult to discern.  They appear very thin, and I was worried they would not hold suture as this was a redo redo redo case.  2 profunda branches, both with excellent backbleeding. Femoral to PT bypass tunneled in the subcutaneous tissue medially.  At the distal anastomosis I performed a vein patch using ipsilateral greater saphenous vein and placed the bypass into the vein patch. The posterior tibial artery was 2.5 mm in size. The posterior tibial artery was palpable at the ankle at case completion.   TECHNIQUE:   Patient is brought to the OR laid in supine position.  General anesthesia was induced the patient was prepped draped standard fashion.  The case began with a standard posterior tibial artery exposure from a medial approach.  An incision was made on the medial calf, and taken down to the posterior tibial artery.  This was not easy as the patient had had previous fasciotomies.  Tissue planes were not normal.  Once the posterior tibial artery was exposed, it was found to be small.  2.5 cm.  It was diseased but soft.  I then moved to expose the redo redo exposure of the right common femoral artery.  Ultrasounds brought to the field and the right common femoral artery and sedated.  The bifurcation was marked and an oblique incision was made.  This was carried down to the common femoral artery.  This took a considerable amount of time to expose the distal external iliac artery, common femoral artery, 2 prior bypasses, native SFA, 2 profunda branches.  The main profunda branch was not controlled with a vessel loop.  As  it was extremely fragile.  An injury required 6-0 Prolene suture.  The other vessels were controlled with Vesseloops.   I then made a tunnel from the postoperative posterior tibial artery to the common femoral artery in the subcutaneous plane.  Next, I moved to evaluate inflow.  Previous angiography demonstrated greater than 1% stenosis at the mid extrailiac  artery at the proximal aspect of previously placed stent.  The patient was heparinized I accessed the common femoral artery with a micropuncture needle, and a wire was run into the aorta.  This was followed by a micropuncture sheath, 0.035 wire, and 7 [place] sheath.  Angiography followed from the sheath.  This demonstrated greater than 70% stenosis at the mid external iliac artery.  A 7 x 40 mm drug-coated balloon was brought to the field and inflated.  Follow-up angiography demonstrated excellent result with resolution of flow-limiting stenosis.   I then used an ultrasound and assess the greater saphenous vein.  There was a short segment that looked very nice, therefore I resected a 5 cm portion to patch the posterior tibial artery prior to starting the bypass on in an effort to improve flow dynamics.  The arteriotomy was closed using 5-0 Prolene suture.  The common femoral artery had to PTFE grafts on top of 1 another.  The first graft was resected, and the Gore Viabahn stent was pulled out in an effort to ligate the PTFE graft with 0 Prolene suture when the graft was pulled out, chronic thrombus was also removed.  I filled the bypass graft with antibiotic laden saline and closed with 0 silk tie.  A 6 mm ringed PTFE bypass graft was brought to the field and tunneled.  The walls of the common femoral artery were very fragile, I was worried that endarterectomy would thin them out dramatically, and would require me to ensure the profunda which was very fragile.  Being that the lumens were widely patent, I elected to bevel a 6 mm ringed PTFE graft and so the graft in end-to-side fashion using running 5-0 Prolene suture.  At completion, the profunda was insonated.  It had an excellent pulse and flow with Doppler.  At this point, I moved to the distal incision.  The bleeding from the 6 mm ringed PTFE graft was very brisk.  A tourniquet was placed, Esmarch wrap used to drain the leg. I clamped the bypass graft  open to the posterior tibial artery with a longitudinal arteriotomy.  The greater saphenous vein was splayed open, and sewn into the posterior tibial artery as a patch using 6-0 Prolene suture in running fashion.  I then pulled the graft to length, beveled it and sewed it in end-to-side fashion using running 6-0 Prolene suture to the previously sewn greater saphenous vein patch.  The tourniquet was released and bypass bled prior to reestablishing inline flow.  When inline flow was reestablished, the patient had an excellent, multiphasic signal in the foot that augmented appropriately with graft occlusion.  The pulse was palpable at the ankle a few minutes later.  The wound beds were irrigated with copious amounts of antibiotic saline.  The right groin. myriad Morcel powder placed in the wound bed to aid in wound healing as it was a redo redo plan with a significant amount of scarring.  Thrombin product was also placed in the wound bed.  Wound bed was closed in layers using 2-0 Vicryl suture with staples and Prevena vacuum dressing at the level of the skin.  The saphenectomy site was closed using 3-0 Vicryl with Monocryl and Dermabond.  The posterior tibial incision was closed using Vicryl suture with staples at the level of the skin.  The patient was taken back in stable condition. Palpable PT pulse   [name], MD [facility] DATE OF DICTATION:   11/20/2023

## 2023-11-20 NOTE — Progress Notes (Signed)
      Right LE well perfused with brisk PT and DP signals B LE Right groin with vac to good suction, tissue soft around vac Lower leg incision dressing with minimal spotting bloody drainage   S/P  Right LE re do bypass femoral to posterior tibial artery bypass using 6 mm ringed PTFE, Right posterior tibial artery vein patch and Right distal external iliac artery drug-coated balloon angioplasty 7 x 40 mm   Stable post disposition with improved inflow to the right LE   Rocky Cipro PA-C

## 2023-11-20 NOTE — Anesthesia Procedure Notes (Signed)
 Procedure Name: Intubation Date/Time: 11/20/2023 7:57 AM  Performed by: Hershall Lory, CRNAPre-anesthesia Checklist: Patient identified, Emergency Drugs available, Suction available and Patient being monitored Patient Re-evaluated:Patient Re-evaluated prior to induction Oxygen Delivery Method: Circle system utilized Preoxygenation: Pre-oxygenation with 100% oxygen Induction Type: IV induction Ventilation: Mask ventilation without difficulty Laryngoscope Size: Mac and 4 Grade View: Grade II Tube type: Oral Tube size: 7.5 mm Number of attempts: 1 Airway Equipment and Method: Stylet and Oral airway Placement Confirmation: ETT inserted through vocal cords under direct vision, positive ETCO2 and breath sounds checked- equal and bilateral Secured at: 22 cm Tube secured with: Tape Dental Injury: Teeth and Oropharynx as per pre-operative assessment

## 2023-11-20 NOTE — Plan of Care (Incomplete Revision)
   Problem: Education: Goal: Knowledge of General Education information will improve Description: Including pain rating scale, medication(s)/side effects and non-pharmacologic comfort measures Outcome: Progressing   Problem: Clinical Measurements: Goal: Ability to maintain clinical measurements within normal limits will improve Outcome: Progressing

## 2023-11-20 NOTE — Progress Notes (Addendum)
 Pt arrived from PACU. CHB bath, assessed, vss, foley, CCMD called and verified. Pt with warm, dry bilateral LE. Doppler pulses. Provena wound vac plugged into wall outlet. Pt resting and aware of NPO status. No complaints at this time. Benna Brasher, RN

## 2023-11-20 NOTE — Plan of Care (Signed)
   Problem: Education: Goal: Knowledge of General Education information will improve Description: Including pain rating scale, medication(s)/side effects and non-pharmacologic comfort measures Outcome: Progressing   Problem: Clinical Measurements: Goal: Ability to maintain clinical measurements within normal limits will improve Outcome: Progressing

## 2023-11-20 NOTE — Anesthesia Postprocedure Evaluation (Signed)
 Anesthesia Post Note  Patient: Donald Gordon  Procedure(s) Performed: BYPASS GRAFT RIGHT COMMON FEMORAL- BELOW KNEE POPLITEAL ARTERY USING X 80CM PROPATEN GRAFT WITH REDO COMMON FEMORAL AND POSTERIOR TIBIAL EXPOSURE (Right: Groin) ANGIOGRAM (Right: Groin) DRUG COATED BALLOON ANGIOPLASTY, RIGHT ILIAC ARTERY (Right: Groin)     Patient location during evaluation: PACU Anesthesia Type: General Level of consciousness: awake and alert Pain management: pain level controlled Vital Signs Assessment: post-procedure vital signs reviewed and stable Respiratory status: spontaneous breathing, nonlabored ventilation, respiratory function stable and patient connected to nasal cannula oxygen Cardiovascular status: blood pressure returned to baseline and stable Postop Assessment: no apparent nausea or vomiting Anesthetic complications: no   No notable events documented.  Last Vitals:  Vitals:   11/20/23 1445 11/20/23 1500  BP: (!) 152/62 (!) 159/62  Pulse: 61 70  Resp: 16 20  Temp:    SpO2:  95%    Last Pain:  Vitals:   11/20/23 1422  TempSrc: Oral  PainSc:                  Valente Gaskin Tenisha Fleece

## 2023-11-20 NOTE — Transfer of Care (Signed)
 Immediate Anesthesia Transfer of Care Note  Patient: Donald Gordon  Procedure(s) Performed: BYPASS GRAFT RIGHT COMMON FEMORAL- BELOW KNEE POPLITEAL ARTERY USING X 80CM PROPATEN GRAFT (Right: Groin) ANGIOGRAM (Right: Groin) ANGIOPLASTY, ARTERY, ILIAC (Right)  Patient Location: PACU  Anesthesia Type:General  Level of Consciousness: awake  Airway & Oxygen Therapy: Patient Spontanous Breathing  Post-op Assessment: Report given to RN  Post vital signs: Reviewed and stable  Last Vitals:  Vitals Value Taken Time  BP 149/72 11/20/23 1300  Temp 37.1 C 11/20/23 1300  Pulse 73 11/20/23 1304  Resp 16 11/20/23 1304  SpO2 96 % 11/20/23 1304  Vitals shown include unfiled device data.  Last Pain:  Vitals:   11/20/23 0630  TempSrc:   PainSc: 6          Complications: No notable events documented.

## 2023-11-21 DIAGNOSIS — Z95828 Presence of other vascular implants and grafts: Secondary | ICD-10-CM

## 2023-11-21 DIAGNOSIS — Z95820 Peripheral vascular angioplasty status with implants and grafts: Secondary | ICD-10-CM

## 2023-11-21 LAB — BASIC METABOLIC PANEL WITH GFR
Anion gap: 14 (ref 5–15)
BUN: 18 mg/dL (ref 8–23)
CO2: 23 mmol/L (ref 22–32)
Calcium: 7.7 mg/dL — ABNORMAL LOW (ref 8.9–10.3)
Chloride: 102 mmol/L (ref 98–111)
Creatinine, Ser: 1.41 mg/dL — ABNORMAL HIGH (ref 0.61–1.24)
GFR, Estimated: 49 mL/min — ABNORMAL LOW (ref >60)
Glucose, Bld: 154 mg/dL — ABNORMAL HIGH (ref 70–99)
Potassium: 3.4 mmol/L — ABNORMAL LOW (ref 3.5–5.1)
Sodium: 139 mmol/L (ref 135–145)

## 2023-11-21 LAB — CBC
HCT: 30.9 % — ABNORMAL LOW (ref 39.0–52.0)
Hemoglobin: 10.3 g/dL — ABNORMAL LOW (ref 13.0–17.0)
MCH: 31.8 pg (ref 26.0–34.0)
MCHC: 33.3 g/dL (ref 30.0–36.0)
MCV: 95.4 fL (ref 80.0–100.0)
Platelets: 213 10*3/uL (ref 150–400)
RBC: 3.24 MIL/uL — ABNORMAL LOW (ref 4.22–5.81)
RDW: 12.2 % (ref 11.5–15.5)
WBC: 10.9 10*3/uL — ABNORMAL HIGH (ref 4.0–10.5)
nRBC: 0 % (ref 0.0–0.2)

## 2023-11-21 LAB — POCT I-STAT 7, (LYTES, BLD GAS, ICA,H+H)
Acid-Base Excess: 0 mmol/L (ref 0.0–2.0)
Acid-base deficit: 1 mmol/L (ref 0.0–2.0)
Bicarbonate: 26.4 mmol/L (ref 20.0–28.0)
Bicarbonate: 25.4 mmol/L (ref 20.0–28.0)
Calcium, Ion: 1.07 mmol/L — ABNORMAL LOW (ref 1.15–1.40)
Calcium, Ion: 1.09 mmol/L — ABNORMAL LOW (ref 1.15–1.40)
HCT: 33 % — ABNORMAL LOW (ref 39.0–52.0)
HCT: 42 % (ref 39.0–52.0)
Hemoglobin: 11.2 g/dL — ABNORMAL LOW (ref 13.0–17.0)
Hemoglobin: 14.3 g/dL (ref 13.0–17.0)
O2 Saturation: 100 %
O2 Saturation: 94 %
Patient temperature: 35.6
Patient temperature: 36.2
Potassium: 2.8 mmol/L — ABNORMAL LOW (ref 3.5–5.1)
Potassium: 3.2 mmol/L — ABNORMAL LOW (ref 3.5–5.1)
Sodium: 141 mmol/L (ref 135–145)
Sodium: 141 mmol/L (ref 135–145)
TCO2: 28 mmol/L (ref 22–32)
TCO2: 27 mmol/L (ref 22–32)
pCO2 arterial: 44.8 mmHg (ref 32–48)
pCO2 arterial: 46.9 mmHg (ref 32–48)
pH, Arterial: 7.371 (ref 7.35–7.45)
pH, Arterial: 7.338 — ABNORMAL LOW (ref 7.35–7.45)
pO2, Arterial: 182 mmHg — ABNORMAL HIGH (ref 83–108)
pO2, Arterial: 74 mmHg — ABNORMAL LOW (ref 83–108)

## 2023-11-21 LAB — LIPID PANEL
Cholesterol: 130 mg/dL (ref 0–200)
HDL: 33 mg/dL — ABNORMAL LOW (ref >40)
LDL Cholesterol: 79 mg/dL (ref 0–99)
Total CHOL/HDL Ratio: 3.9 ratio
Triglycerides: 91 mg/dL (ref <150)
VLDL: 18 mg/dL (ref 0–40)

## 2023-11-21 LAB — POCT ACTIVATED CLOTTING TIME
Activated Clotting Time: 170 s
Activated Clotting Time: 308 s
Activated Clotting Time: 268 s
Activated Clotting Time: 268 s

## 2023-11-21 MED ORDER — ROSUVASTATIN CALCIUM 5 MG PO TABS
10.0000 mg | ORAL_TABLET | Freq: Every day | ORAL | Status: DC
  Administered 2023-11-21 – 2023-11-24 (×4): 10 mg via ORAL
  Filled 2023-11-21 (×4): qty 2

## 2023-11-21 NOTE — Progress Notes (Signed)
 PT Cancellation Note  Patient Details Name: Donald Gordon MRN: [id number] DOB: [birth year]   Cancelled Treatment:    Reason Eval/Treat Not Completed: Patient declined due to high pain levels in right leg despite pre-medication. Acute PT attempted at 13:41 and 15:50. Educated pt on the importance of mobility with pt requesting PT return tomorrow. Acute PT to follow.   Orysia Blas, PT, DPT Secure Chat [facility] Office [phone]   11/21/2023, 3:55 PM

## 2023-11-21 NOTE — Progress Notes (Addendum)
 [facility] [place]  Subjective  - Doing well over all, pain in the right lE surround the incisions.   Objective (!) 159/58 78 98.5 F (36.9 C) (Oral) 20 97%  Intake/Output Summary (Last 24 hours) at 11/21/2023 0719 Last data filed at 11/21/2023 0335 Gross per 24 hour  Intake 570 ml  Output 1350 ml  Net -780 ml   Right LE well perfused with brisk PT and DP signals B LE Right groin with vac to good suction, tissue soft around vac Lower leg incision dressing with minimal spotting bloody drainage General no acute distress   Assessment/Planning: Right LE re do bypass femoral to posterior tibial artery bypass using 6 mm ringed PTFE, Right posterior tibial artery vein patch and Right distal external iliac artery drug-coated balloon angioplasty 7 x 40 mm   Continue Plavix  and Zetia  daily for medical therapy.  He has an allergy to Statins Improved inflow with good doppler signals Pain controlled with Tramadol  Pending mobility and pain control  Rocky Cipro 11/21/2023 7:19 AM --  Laboratory Lab Results: Recent Labs    11/20/23 1513 11/21/23 0026  WBC 10.9* 10.9*  HGB 12.1* 10.3*  HCT 36.8* 30.9*  PLT 216 213   BMET Recent Labs    11/20/23 0707 11/20/23 1513 11/21/23 0026  NA 140  --  139  K 3.5  --  3.4*  CL 103  --  102  CO2  --   --  23  GLUCOSE 109*  --  154*  BUN 21  --  18  CREATININE 1.30* 1.29* 1.41*  CALCIUM   --   --  7.7*    COAG Lab Results  Component Value Date   INR 1.3 (H) 11/11/2023   INR 1.5 (H) 09/10/2022   INR 1.1 01/18/2020   No results found for: "PTT"  I have seen and evaluated the patient. I agree with the PA note as documented above.  Postop day 1 status post right iliac stent including right common femoral to posterior tibial bypass with ringed PTFE (re-do bypass).  No acute events overnight.  Palpable PT pulse at the ankle.  Incisional VAC to the groin.  Out of bed and work with therapy.  Will hold  Eliquis  until Sunday.  Plavix  is ordered for his iliac stent.  Young Hensen, MD [facility] [place] Office: [phone]

## 2023-11-21 NOTE — Progress Notes (Signed)
 PHARMACIST LIPID MONITORING   Donald Gordon is a 83 y.o. male admitted on 11/20/2023 s/p R fempop bympass on 5/30. Pharmacy has been consulted to optimize lipid-lowering therapy with the indication of secondary prevention for clinical ASCVD.  Past Medical History: CAD - s/p MI and PTCA 1996 - s/p LHC 02/2018 >> 30% mLAD, 30% pLCx, 40% pRCA - Recent NSTEMI and PCI to RCA April 2024. PAF diagnosed 2014 - amiodarone  d/c'd 05/2020 due to hyperthyroid - s/p PVI June 2022 [place] Hyperthyroidism Prior CVA 2016 HTN HLD DM COPD Alzheimer's dementia PAD - s/p fem-pop 08/2012 - s/p bilateral iliac stents and thrombolysis/thrombectomy for graft occlusion 02/2013 - s/p PCI of RLE (TP trunk and prox FA) 08/2019  Prior GI bleed following thrombectomy in 2014 Abnormal PFTs GERD BPH spinal stenosis hearing loss Left pubic ramus fx after fall 10/2022  Social hx: quit tobacco 1996    Recent Labs: Lipid Panel (last 6 months):   Lab Results  Component Value Date   CHOL 130 11/21/2023   TRIG 91 11/21/2023   HDL 33 (L) 11/21/2023   CHOLHDL 3.9 11/21/2023   VLDL 18 11/21/2023   LDLCALC 79 11/21/2023    Hepatic function panel (last 6 months):   Lab Results  Component Value Date   AST 14 (L) 11/11/2023   ALT 11 11/11/2023   ALKPHOS 74 11/11/2023   BILITOT 0.7 11/11/2023    SCr (since admission):   Serum creatinine: 1.41 mg/dL (H) 16/10/96 0454 Estimated creatinine clearance: 39.7 mL/min (A)  The ASCVD Risk score (Arnett DK, et al., 2019) failed to calculate for the following reasons:   The 2019 ASCVD risk score is only valid for ages 24 to 80   Risk score cannot be calculated because patient has a medical history suggesting prior/existing ASCVD   Current therapy and lipid therapy tolerance Current lipid-lowering therapy: Zetia  10 mg daily Previous lipid-lowering therapies (if applicable): atorvastatin   Documented or reported allergies or intolerances to  lipid-lowering therapies (if applicable): - atorvastatin  40 mg - reports muscle and bone pain but doesn't recall any other detail  - Doesn't recall trying other statin medications   Assessment:   Patient's lipid panel reports a LDL of 79. Patient is has a very high risk of ASCVD and secondary prevention so LDL goal <55 so patient is not at goal. Ideally, a high-intensity statin is recommended. Since rosuvastatin is more hydrophilic and less likely to cause myalgias, would recommend trialing rosuvastatin (at moderate-intensity dosing) and increase to reach a high-intensity statin dose if his renal function permits.   Plan:    1.Statin intensity (high intensity recommended for all patients regardless of the LDL):  Statin intolerance noted. Discussed with patient, and he is amendable to trialing another statin. Recommend rosuvastatin 10 mg   2.Add ezetimibe  (if any one of the following): continue Zetia  10 mg daily   3.Refer to lipid clinic:   Not at this time - could consider referral to lipid clinic if patient does not tolerate statin to dicuss other therapies (like a PCSK9 inhibitor)   4.Follow-up with:  Primary care provider - [facility]  5.Follow-up labs after discharge:  Changes in lipid therapy were made. Check a lipid panel in 8-12 weeks then annually.     Adaline Ada, PharmD PGY1 Pharmacy Resident 11/21/2023 8:37 AM

## 2023-11-21 NOTE — Evaluation (Signed)
 Occupational Therapy Evaluation Patient Details Name: Donald Gordon MRN: [id number] DOB: Oct 13, 1940 Today's Date: 11/21/2023   History of Present Illness   Pt is an 83 y/o M s/p R fempop bympass on 5/30. PMH includes CKD III, DM, CHF     Clinical Impressions Pt lives alone, reports he is ind with ADLs, though completes most of them seated, and has been using rollator x1 month due to progressive leg pain/weakness. Pt has daughter and neighbor who can assist PRN. Pt currently needing up to mod A for ADLs, min A for bed mobility and min A for transfers with RW. Pt with 5/10 RLE pain but improves once ambulating, able to tolerate low commode surface for toileting. Pt presenting with impairments listed below, will follow acutely. Recommend HHOT at d/c.      If plan is discharge home, recommend the following:   A little help with walking and/or transfers;A little help with bathing/dressing/bathroom;Assistance with cooking/housework;Direct supervision/assist for medications management;Direct supervision/assist for financial management;Assist for transportation;Help with stairs or ramp for entrance     Functional Status Assessment   Patient has had a recent decline in their functional status and demonstrates the ability to make significant improvements in function in a reasonable and predictable amount of time.     Equipment Recommendations   Tub/shower seat;Other (comment) (RW)     Recommendations for Other Services   PT consult     Precautions/Restrictions   Precautions Precaution/Restrictions Comments: preveena R groin Restrictions Weight Bearing Restrictions Per Provider Order: No     Mobility Bed Mobility Overal bed mobility: Needs Assistance Bed Mobility: Supine to Sit     Supine to sit: Min assist     General bed mobility comments: min A for trunk elevation    Transfers Overall transfer level: Needs assistance Equipment used: Rolling walker (2  wheels) Transfers: Sit to/from Stand Sit to Stand: Min assist           General transfer comment: min A to steady, improved to supervision/CGA once ambulating in room      Balance Overall balance assessment: Needs assistance Sitting-balance support: Feet supported Sitting balance-Leahy Scale: Good     Standing balance support: During functional activity Standing balance-Leahy Scale: Fair                             ADL either performed or assessed with clinical judgement   ADL Overall ADL's : Needs assistance/impaired Eating/Feeding: Set up;Sitting   Grooming: Set up;Sitting   Upper Body Bathing: Minimal assistance;Sitting   Lower Body Bathing: Moderate assistance;Sitting/lateral leans;Sit to/from stand   Upper Body Dressing : Minimal assistance;Sitting   Lower Body Dressing: Moderate assistance;Sitting/lateral leans;Sit to/from stand   Toilet Transfer: Contact guard assist;Ambulation;Rolling walker (2 wheels);Regular Toilet   Toileting- Clothing Manipulation and Hygiene: Contact guard assist;Sitting/lateral lean;Sit to/from stand       Functional mobility during ADLs: Contact guard assist;Rolling walker (2 wheels)       Vision   Vision Assessment?: No apparent visual deficits     Perception Perception: Not tested       Praxis Praxis: Not tested       Pertinent Vitals/Pain Pain Assessment Pain Assessment: Faces Pain Score: 5  Faces Pain Scale: Hurts even more Pain Location: RLE Pain Descriptors / Indicators: Discomfort Pain Intervention(s): Limited activity within patient's tolerance, Monitored during session, Repositioned     Extremity/Trunk Assessment Upper Extremity Assessment Upper Extremity Assessment: Overall WFL for tasks  assessed   Lower Extremity Assessment Lower Extremity Assessment: Defer to PT evaluation   Cervical / Trunk Assessment Cervical / Trunk Assessment: Normal   Communication Communication Communication: No  apparent difficulties   Cognition Arousal: Alert Behavior During Therapy: WFL for tasks assessed/performed Cognition: No apparent impairments                               Following commands: Intact       Cueing  General Comments   Cueing Techniques: Verbal cues  BP elevated 170/62 RN notified   Exercises     Shoulder Instructions      Home Living Family/patient expects to be discharged to:: Private residence Living Arrangements: Alone Available Help at Discharge: Family;Available PRN/intermittently;Friend(s) (daughter) Type of Home: House Home Access: Stairs to enter [employer] of Steps: 3 Entrance Stairs-Rails: Can reach both Home Layout: One level     Bathroom Shower/Tub: Chief Strategy Officer: Standard     Home Equipment: Rollator (4 wheels);Cane - single point;Wheelchair - manual;BSC/3in1          Prior Functioning/Environment Prior Level of Function : Independent/Modified Independent;Driving             Mobility Comments: has been using rollator daily x4 weeks, reports no falls ADLs Comments: ind with ADLs but completes them seated    OT Problem List: Decreased strength;Decreased activity tolerance;Decreased range of motion;Impaired balance (sitting and/or standing);Cardiopulmonary status limiting activity;Pain   OT Treatment/Interventions: Self-care/ADL training;Therapeutic exercise;Energy conservation;DME and/or AE instruction;Therapeutic activities;Patient/family education;Balance training      OT Goals(Current goals can be found in the care plan section)   Acute Rehab OT Goals Patient Stated Goal: none stated OT Goal Formulation: With patient Time For Goal Achievement: 12/05/23 Potential to Achieve Goals: Good ADL Goals Pt Will Perform Lower Body Dressing: with modified independence;sitting/lateral leans;sit to/from stand;with adaptive equipment Pt Will Transfer to Toilet: with modified  independence;ambulating;regular height toilet Pt Will Perform Tub/Shower Transfer: Tub transfer;Shower transfer;with modified independence;ambulating Additional ADL Goal #1: pt will tolerate OOB activity x15 min in order to improve balance and activity tolerance for ADLs   OT Frequency:  Min 2X/week    Co-evaluation              AM-PAC OT "6 Clicks" Daily Activity     Outcome Measure Help from another person eating meals?: None Help from another person taking care of personal grooming?: A Little Help from another person toileting, which includes using toliet, bedpan, or urinal?: A Little Help from another person bathing (including washing, rinsing, drying)?: A Lot Help from another person to put on and taking off regular upper body clothing?: A Little Help from another person to put on and taking off regular lower body clothing?: A Lot 6 Click Score: 17   End of Session Equipment Utilized During Treatment: Gait belt;Rolling walker (2 wheels) Nurse Communication: Mobility status  Activity Tolerance: Patient tolerated treatment well Patient left: with call bell/phone within reach;in chair;with chair alarm set  OT Visit Diagnosis: Unsteadiness on feet (R26.81);Other abnormalities of gait and mobility (R26.89);Muscle weakness (generalized) (M62.81)                Time: 1610-9604 OT Time Calculation (min): 31 min Charges:  OT General Charges $OT Visit: 1 Visit OT Evaluation $OT Eval Moderate Complexity: 1 Mod OT Treatments $Self Care/Home Management : 8-22 mins  George Alcantar K, OTD, OTR/L [employer] [facility] Acute Rehab [phone]   Jereline Ticer K Koonce 11/21/2023, 8:28 AM

## 2023-11-22 MED ORDER — APIXABAN 5 MG PO TABS
5.0000 mg | ORAL_TABLET | Freq: Two times a day (BID) | ORAL | Status: DC
  Administered 2023-11-22 – 2023-11-24 (×5): 5 mg via ORAL
  Filled 2023-11-22 (×5): qty 1

## 2023-11-22 NOTE — Evaluation (Addendum)
 Physical Therapy Evaluation Patient Details Name: Donald Gordon MRN: [id number] DOB: [birth year] Today's Date: 11/22/2023  History of Present Illness  Pt is an 83 y/o M s/p R fempop bympass on 5/30. PMH includes CKD III, DM, CHF  Clinical Impression  Pt admitted with above diagnosis. Pt was able to ambulate with RW short distance in room with CGA and cues only for safety as he tends to lean on RW. Pt states,"I will be fine when my pain is gone in this leg."  Tried to give pt safety tips for use of RW and also tried to get pt to allow PT to make walker shorter and pt stated, " I like it the height it is."  Pt leaning on RW at times.  No overt LOB. Pt appears to have "his way" of doing things and was not following instruction for safety during this session.  Pt has rollator at home.  Pt does live alone and would benefit from another session of PT tomorrow.  OT will see pt later today as well.  Issued HEP to pt. Given pts safety issues as well as pain and weakness, recommend post acute rehab < 3 hours day although pt may refuse as he states, " I will be fine getting around at home."  Have updated CM/SW as to recommendations. Pt currently with functional limitations due to the deficits listed below (see PT Problem List). Pt will benefit from acute skilled PT to increase their independence and safety with mobility to allow discharge.           If plan is discharge home, recommend the following: A little help with bathing/dressing/bathroom;Assistance with cooking/housework;Assist for transportation;Help with stairs or ramp for entrance   Can travel by private vehicle        Equipment Recommendations None recommended by PT  Recommendations for Other Services       Functional Status Assessment Patient has had a recent decline in their functional status and demonstrates the ability to make significant improvements in function in a reasonable and predictable amount of time.     Precautions /  Restrictions Precautions Precaution/Restrictions Comments: preveena R groin Restrictions Weight Bearing Restrictions Per Provider Order: No      Mobility  Bed Mobility Overal bed mobility: Needs Assistance Bed Mobility: Sit to Supine     Supine to sit: Min assist     General bed mobility comments: Pt in bathroom on toilet on arrival.  Assist to move right LE into bed at end of session    Transfers Overall transfer level: Needs assistance Equipment used: Rolling walker (2 wheels) Transfers: Sit to/from Stand Sit to Stand: Contact guard assist           General transfer comment: Pt used rails to stand from toilet and has a 3N1 at home.No assist needed with pt slow to rise but safe.    Ambulation/Gait Ambulation/Gait assistance: Contact guard assist Gait Distance (Feet): 20 Feet Assistive device: Rolling walker (2 wheels) Gait Pattern/deviations: Step-to pattern, Decreased step length - left, Decreased step length - right, Decreased stance time - right, Decreased weight shift to right, Trunk flexed, Wide base of support   Gait velocity interpretation: <1.31 ft/sec, indicative of household ambulator   General Gait Details: Pt ambulated from bathroom to bed. Refused further ambulation stating he "doesnt walk far at home".  Pt would not allow PT to make walker shorter as it was too tall.  Pt leaning on RW at times due to right groin  and right LE pain. Asked pt if he leaned on rollator at home and he stated "I can get around fine once I feel better."  Pt did not need physical assist today but cues needed for safety.  Pt states he walks limited distances at home.  Stairs            Wheelchair Mobility     Tilt Bed    Modified Rankin (Stroke Patients Only)       Balance Overall balance assessment: Needs assistance Sitting-balance support: Feet supported Sitting balance-Leahy Scale: Good     Standing balance support: During functional activity, Bilateral upper  extremity supported, Reliant on assistive device for balance Standing balance-Leahy Scale: Poor Standing balance comment: relies on RW for stability but no external support needed.                             Pertinent Vitals/Pain Pain Assessment Pain Assessment: Faces Faces Pain Scale: Hurts even more Pain Location: RLE Pain Descriptors / Indicators: Discomfort Pain Intervention(s): Limited activity within patient's tolerance, Monitored during session, Repositioned    Home Living Family/patient expects to be discharged to:: Private residence Living Arrangements: Alone Available Help at Discharge: Family;Available PRN/intermittently;Friend(s) (daughter) Type of Home: House Home Access: Stairs to enter Entrance Stairs-Rails: Can reach both Entrance Stairs-Number of Steps: 3   Home Layout: One level Home Equipment: Rollator (4 wheels);Cane - single point;Wheelchair - manual;BSC/3in1      Prior Function Prior Level of Function : Independent/Modified Independent;Driving             Mobility Comments: has been using rollator daily x4 weeks, reports no falls ADLs Comments: ind with ADLs but completes them seated     Extremity/Trunk Assessment   Upper Extremity Assessment Upper Extremity Assessment: Defer to OT evaluation    Lower Extremity Assessment Lower Extremity Assessment: RLE deficits/detail RLE: Unable to fully assess due to pain    Cervical / Trunk Assessment Cervical / Trunk Assessment: Normal  Communication   Communication Communication: No apparent difficulties    Cognition Arousal: Alert Behavior During Therapy: WFL for tasks assessed/performed   PT - Cognitive impairments: No apparent impairments                         Following commands: Intact       Cueing Cueing Techniques: Verbal cues     General Comments General comments (skin integrity, edema, etc.): 76 bpm after activity    Exercises General Exercises - Lower  Extremity Ankle Circles/Pumps: AROM, Both, Supine Quad Sets: AROM, Both, Supine Gluteal Sets: AROM, Both, Supine Heel Slides: AAROM, Both, Supine Hip ABduction/ADduction: AAROM, Both, Supine Straight Leg Raises: AAROM, Both, Supine Other Exercises Other Exercises: Educated and distributed handout [id number] [employer] HEP.  Pt did not want to do many reps but did go over exercises with pt.   Assessment/Plan    PT Assessment Patient needs continued PT services  PT Problem List Decreased activity tolerance;Decreased balance;Decreased mobility;Decreased range of motion;Decreased knowledge of use of DME;Decreased safety awareness;Decreased knowledge of precautions;Pain       PT Treatment Interventions DME instruction;Gait training;Functional mobility training;Therapeutic activities;Therapeutic exercise;Balance training;Patient/family education;Stair training    PT Goals (Current goals can be found in the Care Plan section)  Acute Rehab PT Goals Patient Stated Goal: to go home and get rid of pain PT Goal Formulation: With patient Time For Goal Achievement: 12/06/23 Potential to Achieve Goals:  Fair    Frequency Min 2X/week     Co-evaluation               AM-PAC PT "6 Clicks" Mobility  Outcome Measure Help needed turning from your back to your side while in a flat bed without using bedrails?: A Little Help needed moving from lying on your back to sitting on the side of a flat bed without using bedrails?: A Little Help needed moving to and from a bed to a chair (including a wheelchair)?: A Little Help needed standing up from a chair using your arms (e.g., wheelchair or bedside chair)?: A Little Help needed to walk in hospital room?: A Little Help needed climbing 3-5 steps with a railing? : A Little 6 Click Score: 18    End of Session Equipment Utilized During Treatment: Gait belt Activity Tolerance: Patient tolerated treatment well Patient left: with call bell/phone within  reach;with bed alarm set;in bed Nurse Communication: Mobility status PT Visit Diagnosis: Muscle weakness (generalized) (M62.81);Pain Pain - Right/Left: Right Pain - part of body: Leg    Time: 0842-0900 PT Time Calculation (min) (ACUTE ONLY): 18 min   Charges:   PT Evaluation $PT Eval Moderate Complexity: 1 Mod   PT General Charges $$ ACUTE PT VISIT: 1 Visit         Shelle Galdamez M,PT Acute Rehab Services [phone]   Florencia Hunter 11/22/2023, 9:18 AM

## 2023-11-22 NOTE — Care Management (Signed)
 Encounter submitted through [place] portal confirmation number [id number] [phone]

## 2023-11-22 NOTE — Progress Notes (Addendum)
 [facility]  Subjective  - Feels generalized weakness this am.  Sitting on side of bed eating.   Objective (!) 172/64 78 99.8 F (37.7 C) (Oral) 20 94%  Intake/Output Summary (Last 24 hours) at 11/22/2023 1610 Last data filed at 11/22/2023 0000 Gross per 24 hour  Intake 356 ml  Output 1000 ml  Net -644 ml    Right LE well perfused with brisk PT and DP signals B LE Right groin with vac to good suction, tissue soft around vac Lower leg incision dressing with minimal spotting bloody drainage  Assessment/Planning: Right LE re do bypass femoral to posterior tibial artery bypass using 6 mm ringed PTFE, Right posterior tibial artery vein patch and Right distal external iliac artery drug-coated balloon angioplasty 7 x 40 mm   Crestor started 11/21/23, Zetia , and Plavix  Eliquis  restarted today per pharmacy Improved inflow with good doppler signals Pain controlled with Tramadol  HGB stable 10.3, feels weak this am in general.    He has not eaten much and thinks maybe food will help.  Pending mobility and pain control Improving mobility working with PT/OT.  He lives alone.  Rocky Cipro 11/22/2023 7:11 AM --  Laboratory Lab Results: Recent Labs    11/20/23 1513 11/21/23 0026  WBC 10.9* 10.9*  HGB 12.1* 10.3*  HCT 36.8* 30.9*  PLT 216 213   BMET Recent Labs    11/20/23 0707 11/20/23 0826 11/20/23 1102 11/20/23 1513 11/21/23 0026  NA 140   < > 141  --  139  K 3.5   < > 3.2*  --  3.4*  CL 103  --   --   --  102  CO2  --   --   --   --  23  GLUCOSE 109*  --   --   --  154*  BUN 21  --   --   --  18  CREATININE 1.30*  --   --  1.29* 1.41*  CALCIUM   --   --   --   --  7.7*   < > = values in this interval not displayed.    COAG Lab Results  Component Value Date   INR 1.3 (H) 11/11/2023   INR 1.5 (H) 09/10/2022   INR 1.1 01/18/2020   No results found for: "PTT"  I have seen and evaluated the patient. I agree with the PA  note as documented above.  Postop day 2 status post right common femoral to posterior tibial bypass.  Right PT palpable.  Incisions all look good with VAC in the groin.  Start Eliquis  in addition to Plavix .  Young Hensen, MD [facility] Office: [phone]

## 2023-11-22 NOTE — Progress Notes (Signed)
   11/22/23 0345  Vitals  Temp 99.8 F (37.7 C)  Temp Source Oral  BP (!) 172/64  MAP (mmHg) 95  BP Location Left Arm  BP Method Automatic  Patient Position (if appropriate) Lying  Pulse Rate 78  Pulse Rate Source Monitor  ECG Heart Rate 81  Resp 20   10 mg labetalol  given per MD order

## 2023-11-22 NOTE — Progress Notes (Addendum)
 Occupational Therapy Treatment Patient Details Name: Donald Gordon MRN: [id number] DOB: July 18, 1940 Today's Date: 11/22/2023   History of present illness Pt is an 83 y/o M s/p R fempop bympass on 5/30. PMH includes CKD III, DM, CHF   OT comments  Pt. Seen for skilled OT treatment session.  Great difficulty noted during bed mobility without use of rails or HOB elevation that he will not have access to at home.  Min A for trunk support coming into sitting and required assistance guiding RLE towards eob. For back to bed pt. Able to guide BLEs into bed but not problem solve how to adjust hips and scoot without assistance. Required cues for management of wound vac also. Cont. To say "what's this thing".    Unable to raise RLE to needed height to clear tub ledge for bathing.  Would need to sponge bathe initially or acquire a bench to gain access safely into tub vs. Stepping over. Pt. Lives alone and states he has a neighbor he "hopes can help him".  Rn also reported some cognitive variations observed during day vs. Night shift.   Discussed tx session with PT with similar safety concerns noted during her session and we have reached out to SW to explore the option of <3hrs/day continued therapies for pt. Prior to home.    BP supine to sit: 151/129-138, RN notified pt. States "it sometimes happens when I haven't been doing anything and then move and then I get really sweaty and then I cool off"   BP sit to stand: 124/105-111       If plan is discharge home, recommend the following:  A little help with walking and/or transfers;A little help with bathing/dressing/bathroom;Assistance with cooking/housework;Direct supervision/assist for medications management;Direct supervision/assist for financial management;Assist for transportation;Help with stairs or ramp for entrance   Equipment Recommendations  Tub/shower seat;Other (comment)    Recommendations for Other Services      Precautions / Restrictions  Precautions Precaution/Restrictions Comments: preveena R groin       Mobility Bed Mobility Overal bed mobility: Needs Assistance Bed Mobility: Sit to Supine, Supine to Sit     Supine to sit: Min assist Sit to supine: Contact guard assist   General bed mobility comments: hob lowered to home height with pillows, height of bed set for height at home, no rails. pt. cont. to reach for rails, max cues to attempt like he would at home.  pt. required trunk support to come upright, required assistance guiding RLE towards eob. cont. to say he has a neighbor that he "hopes will help him".  asked if the neighbor would be able to assist with bed mobility the way i was and he could not say.  for back to bed pt. able to lift RLE with his BUEs and guide into bed, brought LLE into bed without assistance.  laying crooked in the bed but difficulty trouble shooting how to adjust hips and scoot over. requried assistance shifting hips to the R.  also to manage wound vac, cont. to say "whats this thing"    Transfers Overall transfer level: Needs assistance Equipment used: Rolling walker (2 wheels) Transfers: Sit to/from Stand Sit to Stand: Contact guard assist, From elevated surface           General transfer comment: set height of bed height he says it is at home     Balance  ADL either performed or assessed with clinical judgement   ADL Overall ADL's : Needs assistance/impaired                                 Tub/ Shower Transfer: Tub transfer;Maximal assistance;Ambulation Tub/Shower Transfer Details (indicate cue type and reason): simulated to see if pt. could clear tub height for L faucet. pt. unable to bring leg to est. height of tub. reviewed sponge bathing as safer option until balance/rom for RLE increases Functional mobility during ADLs: Minimal assistance;Rolling walker (2 wheels) General ADL Comments: session  focused on bed mobility and tub transfer    Extremity/Trunk Assessment              Vision       Perception     Praxis     Communication     Cognition Arousal: Alert Behavior During Therapy: WFL for tasks assessed/performed Cognition: No family/caregiver present to determine baseline             OT - Cognition Comments: rn reports that pt. sundowns at night from night shift report to her                 Following commands: Intact        Cueing   Cueing Techniques: Verbal cues  Exercises      Shoulder Instructions       General Comments      Pertinent Vitals/ Pain       Pain Assessment Pain Assessment: Faces Faces Pain Scale: Hurts little more Pain Location: RLE and back Pain Descriptors / Indicators: Discomfort Pain Intervention(s): Repositioned, RN gave pain meds during session, Monitored during session, Limited activity within patient's tolerance  Home Living                                          Prior Functioning/Environment              Frequency  Min 2X/week        Progress Toward Goals  OT Goals(current goals can now be found in the care plan section)  Progress towards OT goals: Progressing toward goals     Plan      Co-evaluation                 AM-PAC OT "6 Clicks" Daily Activity     Outcome Measure   Help from another person eating meals?: None Help from another person taking care of personal grooming?: A Little Help from another person toileting, which includes using toliet, bedpan, or urinal?: A Little Help from another person bathing (including washing, rinsing, drying)?: A Lot Help from another person to put on and taking off regular upper body clothing?: A Little Help from another person to put on and taking off regular lower body clothing?: A Lot 6 Click Score: 17    End of Session Equipment Utilized During Treatment: Rolling walker (2 wheels)  OT Visit Diagnosis:  Unsteadiness on feet (R26.81);Other abnormalities of gait and mobility (R26.89);Muscle weakness (generalized) (M62.81)   Activity Tolerance Patient tolerated treatment well   Patient Left in bed;with call bell/phone within reach;with bed alarm set   Nurse Communication Other (comment);Patient requests pain meds;Mobility status (alerted RN of BPs with supine to sit and sit/stand. she provided meds during session.  also reviewed mobility  status with her and concerns for home alone)        Time: 1223-1247 OT Time Calculation (min): 24 min  Charges: OT General Charges $OT Visit: 1 Visit OT Treatments $Self Care/Home Management : 23-37 mins  Howell Macintosh, COTA/L Acute Rehabilitation [phone]   Leory Rands [place]-COTA/L  11/22/2023, 1:16 PM

## 2023-11-23 ENCOUNTER — Encounter (HOSPITAL_COMMUNITY): Payer: Self-pay | Admitting: Vascular Surgery

## 2023-11-23 LAB — BPAM RBC
Blood Product Expiration Date: 202506232359
Blood Product Expiration Date: 202506252359
Blood Product Expiration Date: 202506252359
Blood Product Expiration Date: 202506252359
ISSUE DATE / TIME: 202505300718
ISSUE DATE / TIME: 202505300718
Unit Type and Rh: 6200
Unit Type and Rh: 6200
Unit Type and Rh: 6200
Unit Type and Rh: 6200

## 2023-11-23 LAB — CBC
HCT: 32.4 % — ABNORMAL LOW (ref 39.0–52.0)
Hemoglobin: 10.6 g/dL — ABNORMAL LOW (ref 13.0–17.0)
MCH: 31.4 pg (ref 26.0–34.0)
MCHC: 32.7 g/dL (ref 30.0–36.0)
MCV: 95.9 fL (ref 80.0–100.0)
Platelets: 203 10*3/uL (ref 150–400)
RBC: 3.38 MIL/uL — ABNORMAL LOW (ref 4.22–5.81)
RDW: 12.2 % (ref 11.5–15.5)
WBC: 10.9 10*3/uL — ABNORMAL HIGH (ref 4.0–10.5)
nRBC: 0 % (ref 0.0–0.2)

## 2023-11-23 LAB — TYPE AND SCREEN
ABO/RH(D): A POS
Antibody Screen: NEGATIVE
Unit division: 0
Unit division: 0
Unit division: 0
Unit division: 0

## 2023-11-23 NOTE — Discharge Instructions (Signed)
 [facility]  Discharge instructions  Lower Extremity Bypass Surgery  Please refer to the following instruction for your post-procedure care. Your surgeon or physician assistant will discuss any changes with you.  Activity  You are encouraged to walk as much as you can. You can slowly return to normal activities during the month after your surgery. Avoid strenuous activity and heavy lifting until your doctor tells you it's OK. Avoid activities such as vacuuming or swinging a golf club. Do not drive until your doctor give the OK and you are no longer taking prescription pain medications. It is also normal to have difficulty with sleep habits, eating and bowel movement after surgery. These will go away with time.  Bathing/Showering  Shower daily after you go home. Do not soak in a bathtub, hot tub, or swim until the incision heals completely.  Incision Care  Clean your incision with mild soap and water. Shower every day. Pat the area dry with a clean towel. You do not need a bandage unless otherwise instructed. Do not apply any ointments or creams to your incision. If you have open wounds you will be instructed how to care for them or a visiting nurse may be arranged for you. If you have staples or sutures along your incision they will be removed at your post-op appointment. You may have skin glue on your incision. Do not peel it off. It will come off on its own in about one week.   Keep Pravena wound vac on your groin incision until it loses it seal in about 7-10 days.  Once that happens, you can remove the entire sponge and then wash the groin wound with soap and water daily and pat dry. (No tub bath-only shower)  Then put a dry gauze or washcloth in the groin to keep this area dry to help prevent wound infection.  Do this daily and as needed.  Do not use Vaseline, powder or neosporin on your incisions.  Only use soap and water on your incisions and then  protect and keep dry.  Diet  Resume your normal diet. There are no special food restrictions following this procedure. A low fat/ low cholesterol diet is recommended for all patients with vascular disease. In order to heal from your surgery, it is CRITICAL to get adequate nutrition. Your body requires vitamins, minerals, and protein. Vegetables are the best source of vitamins and minerals. Vegetables also provide the perfect balance of protein. Processed food has little nutritional value, so try to avoid this.  Medications  Resume taking all your medications unless your doctor or physician assistant tells you not to. If your incision is causing pain, you may take over-the-counter pain relievers such as acetaminophen  (Tylenol ). If you were prescribed a stronger pain medication, please aware these medication can cause nausea and constipation. Prevent nausea by taking the medication with a snack or meal. Avoid constipation by drinking plenty of fluids and eating foods with high amount of fiber, such as fruits, vegetables, and grains. Take Colace 100 mg (an over-the-counter stool softener) twice a day as needed for constipation.  Do not take Tylenol  if you are taking prescription pain medications.  Follow Up  Our office will schedule a follow up appointment 2-3 weeks following discharge.  Please call us  immediately for any of the following conditions  Severe or worsening pain in your legs or feet while at rest or while walking Increase pain, redness, warmth, or drainage (pus) from your incision  site(s) Fever of 101 degree or higher The swelling in your leg with the bypass suddenly worsens and becomes more painful than when you were in the hospital If you have been instructed to feel your graft pulse then you should do so every day. If you can no longer feel this pulse, call the office immediately. Not all patients are given this instruction.  Leg swelling is common after leg bypass surgery.  The  swelling should improve over a few months following surgery. To improve the swelling, you may elevate your legs above the level of your heart while you are sitting or resting. Your surgeon or physician assistant may ask you to apply an ACE wrap or wear compression (TED) stockings to help to reduce swelling.  Reduce your risk of vascular disease  Stop smoking. If you would like help call [facility] at 1-800-QUIT-NOW (718-809-0418) or [place] at 2083972417.  Manage your cholesterol Maintain a desired weight Control your diabetes weight Control your diabetes Keep your blood pressure down  If you have any questions, please call the office at [phone]

## 2023-11-23 NOTE — Discharge Summary (Signed)
 Discharge Summary     Donald Gordon 03/04/1941 83 y.o. male  [id number]  Admission Date: 11/20/2023  Discharge Date: 11/24/2023  Physician: [name], MD  Admission Diagnosis: Atherosclerosis of native artery of right lower extremity with rest pain (HCC) [I70.221] Status post femoral-popliteal bypass surgery [Z95.828] 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) [I70.221]  HPI:   This is a 83 y.o. male well-known to our service line having undergone multiple interventions to the right lower extremity including above-knee popliteal artery bypass, below-knee popliteal artery bypass, pharmacomechanical thrombolysis, recanalization with stenting. He presents today with occluded right sided femoral to below-knee popliteal artery bypass. Unknown when this occurred. He underwent right lower extremity angiogram demonstrating filling of the posterior tibial artery which continued into the foot. After discussing risks and benefits of femoral to posterior tibial artery bypass with external iliac artery balloon angioplasty versus stent, Donald Gordon elected to proceed.   Hospital Course:  The patient was admitted to the hospital and taken to the operating room on 11/20/2023 and underwent: Redo, redo exposure of the right common femoral artery greater than 30 days Redo exposure of the right posterior tibial artery greater than 30 days Right distal external iliac artery drug-coated balloon angioplasty 7 x 40 mm Right greater saphenous vein saphenectomy Right femoral to posterior tibial artery bypass using 6 mm ringed PTFE Right posterior tibial artery vein patch Myriad morcel biologic placement in the right groin Right groin Prevena vacuum dressing    Findings: Greater than 70% stenosis of the mid external iliac artery at the proximal portion of previously placed stent Two occluded PTFE bypasses laying on top of each other at the right common femoral artery. The top bypass was  resected into the thigh, stents pulled out with old thrombus present.  Bypass filled with antibiotic laden saline and ligated using 0 silk tie. The first bypass was left in situ as the walls of the artery were difficult to discern.  They appear very thin, and I was worried they would not hold suture as this was a redo redo redo case.  2 profunda branches, both with excellent backbleeding. Femoral to PT bypass tunneled in the subcutaneous tissue medially.  At the distal anastomosis I performed a vein patch using ipsilateral greater saphenous vein and placed the bypass into the vein patch. The posterior tibial artery was 2.5 mm in size. The posterior tibial artery was palpable at the ankle at case completion.  The pt tolerated the procedure well and was transported to the PACU in good condition.   Postop day 1 status post right iliac stent including right common femoral to posterior tibial bypass with ringed PTFE (re-do bypass).  No acute events overnight.  Palpable PT pulse at the ankle.  Incisional VAC to the groin.  Out of bed and work with therapy.  Will hold Eliquis  until Sunday.  Plavix  is ordered for his iliac stent.   Postop day 2 status post right common femoral to posterior tibial bypass.  Right PT palpable.  Incisions all look good with VAC in the groin.  Start Eliquis  in addition to Plavix .   Postop day 3, Continues to have a palpable PT pulse at the ankle.  All of his incisions look good.  Eliquis  and Plavix .  Leave incisional VAC for 1 week in the groin per Dr. Rosalva Comber.  Will work with therapy more today.  Will see how he does with discharge either today or tomorrow.   Postop day 4, Continues to have  a palpable PT pulse at the ankle. On Plavix  Eliquis . PT is recommending SNF but patient declines. States he has plenty of support at home. Will plan tentative discharge today. Discussed leaving the VAC in place for 1 week with the Prevena. Will arrange short order follow-up in 2 to 3 weeks.  Incisions look good.   Pt has allergy to statin.  Pt was started on Crestor 10mg  daily.  Pharmacy spoke with pt prior to discharge and pt was tolerating and therefore, rx sent to pharmacy.    CBC    Component Value Date/Time   WBC 10.9 (H) 11/23/2023 0352   RBC 3.38 (L) 11/23/2023 0352   HGB 10.6 (L) 11/23/2023 0352   HGB 11.0 (L) 12/05/2020 1407   HCT 32.4 (L) 11/23/2023 0352   HCT 35.5 (L) 12/05/2020 1407   PLT 203 11/23/2023 0352   PLT 254 12/05/2020 1407   MCV 95.9 11/23/2023 0352   MCV 85 12/05/2020 1407   MCH 31.4 11/23/2023 0352   MCHC 32.7 11/23/2023 0352   RDW 12.2 11/23/2023 0352   RDW 20.4 (H) 12/05/2020 1407   LYMPHSABS 2.0 09/10/2022 1807   MONOABS 1.1 (H) 09/10/2022 1807   EOSABS 0.1 09/10/2022 1807   BASOSABS 0.1 09/10/2022 1807    BMET    Component Value Date/Time   NA 139 11/21/2023 0026   NA 139 12/05/2020 1407   K 3.4 (L) 11/21/2023 0026   CL 102 11/21/2023 0026   CO2 23 11/21/2023 0026   GLUCOSE 154 (H) 11/21/2023 0026   BUN 18 11/21/2023 0026   BUN 25 12/05/2020 1407   CREATININE 1.41 (H) 11/21/2023 0026   CREATININE 1.51 (H) 08/31/2020 1046   CALCIUM  7.7 (L) 11/21/2023 0026   GFRNONAA 49 (L) 11/21/2023 0026   GFRNONAA 46 (L) 08/31/2020 1046   GFRAA 52 (L) 01/19/2020 0547     Discharge Instructions     Discharge patient   Complete by: As directed    Discharge home after pt has meds from [facility] pharmacy   Discharge disposition: 01-Home or Self Care   Discharge patient date: 11/24/2023       Discharge Diagnosis:  Atherosclerosis of native artery of right lower extremity with rest pain (HCC) [I70.221] Status post femoral-popliteal bypass surgery [Z95.828] 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) [I70.221]  Secondary Diagnosis: Patient Active Problem List   Diagnosis Date Noted   Status post femoral-popliteal bypass surgery 11/20/2023   Malignant neoplasm of lower lobe of right lung (HCC) 08/19/2023   Critical limb ischemia  of right lower extremity (HCC) 09/10/2022   Chronic kidney disease, stage 3a (HCC) 09/10/2022   Secondary hypercoagulable state (HCC) 01/07/2021   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) 09/03/2020   Right lower lobe pulmonary nodule 09/03/2020   Thrombosis of femoro-popliteal bypass graft (HCC) 06/02/2020   Chest pain 05/18/2020   Mixed diabetic hyperlipidemia associated with type 2 diabetes mellitus (HCC) 05/18/2020   Acute renal failure superimposed on stage 3a chronic kidney disease (HCC) 05/18/2020   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 esophagitis 05/18/2020   Chronic diastolic (congestive) heart failure (HCC) 05/18/2020   Critical limb ischemia with history of revascularization of same extremity (HCC) 01/18/2020   Critical lower limb ischemia (HCC) 08/31/2019   PAD (peripheral artery disease) (HCC) 07/23/2019   Ischemia of right lower extremity 01/09/2018   Critical ischemia of foot (HCC) 01/09/2018   Atrial fibrillation (HCC) 07/04/2014   Atrial fibrillation with rapid ventricular response (HCC)    Paroxysmal atrial  fibrillation (HCC) 07/02/2014   Essential hypertension 07/02/2014   Atherosclerosis of native coronary artery of native heart with angina pectoris (HCC) 07/02/2014   Cough 07/02/2014   Atrial fibrillation with RVR (HCC) 07/02/2014   Type 2 diabetes mellitus with stage 3a chronic kidney disease, without long-term current use of insulin  (HCC) 07/02/2014   Past Medical History:  Diagnosis Date   Arthritis    "knees, elbows" (03/18/2018)   Chronic edema    a. Chronic RLE edema.   COPD (chronic obstructive pulmonary disease) (HCC)    Coronary artery disease    a. MI s/p balloon 1996, details unclear.   Dysrhythmia    PROXIMAL MARGIN FIBRILATION   GERD (gastroesophageal reflux disease)    Hyperlipidemia    Hypertension    Myocardial infarction (HCC) 1996   PAD (peripheral artery disease) (HCC)    a. s/p stenting 08/2012, 02/2013.    PAF (paroxysmal atrial  fibrillation) (HCC)    Sleep apnea    pt denies ever having sleep study   Stroke [facility]) ~ 2014   denies residual on 03/18/2018   Thrombosis of lower extremity    a. Listed on patient's medical bracelet - at [place].   Type II diabetes mellitus (HCC)      Allergies as of 11/24/2023       Reactions   Oxycodone  Nausea And Vomiting   Statins Other (See Comments)   Muscle and Bone pain   Amiodarone     Caused issues and was stopped by MD- possible was the cause for the patient's heart "going in and out of rhythm" multiple times   Donepezil Other (See Comments)   Per [facility] Insomnia   Imipramine Other (See Comments)   Per [facility]   Oxybutynin Chloride Swelling, Other (See Comments)   Per [facility]        Medication List     TAKE these medications    acetaminophen  500 MG tablet Commonly known as: TYLENOL  Take 500-1,000 mg by mouth 4 (four) times daily as needed for moderate pain (pain score 4-6) or headache.   albuterol  108 (90 Base) MCG/ACT inhaler Commonly known as: VENTOLIN  HFA Inhale 1 puff into the lungs every 6 (six) hours as needed for shortness of breath.   apixaban  5 MG Tabs tablet Commonly known as: Eliquis  Take 1 tablet (5 mg total) by mouth 2 (two) times daily.   clopidogrel  75 MG tablet Commonly known as: PLAVIX  Take 1 tablet by mouth daily.   ezetimibe  10 MG tablet Commonly known as: ZETIA  Take 10 mg by mouth every evening.   ferrous sulfate  325 (65 FE) MG tablet Take 325 mg by mouth every other day.   finasteride  5 MG tablet Commonly known as: PROSCAR  Take 5 mg by mouth daily.   hydrochlorothiazide  12.5 MG capsule Commonly known as: MICROZIDE  Take 12.5 mg by mouth daily.   memantine  10 MG tablet Commonly known as: NAMENDA  Take 20 mg by mouth at bedtime.   metoprolol  tartrate 50 MG tablet Commonly known as: LOPRESSOR  Take 50 mg by mouth 2 (two) times daily.   nitroGLYCERIN  0.4 MG SL tablet Commonly known as: NITROSTAT  Place 0.4 mg under the tongue every  5 (five) minutes x 3 doses as needed for chest pain.   pantoprazole  40 MG tablet Commonly known as: PROTONIX  Take 40 mg by mouth daily before breakfast.   ramipril  10 MG capsule Commonly known as: ALTACE  Take 10 mg by mouth 2 (two) times daily.   rivastigmine  6 MG capsule Commonly known  as: EXELON  Take 6 mg by mouth 2 (two) times daily.   rosuvastatin 10 MG tablet Commonly known as: CRESTOR Take 1 tablet (10 mg total) by mouth daily.   traMADol  50 MG tablet Commonly known as: ULTRAM  Take 1 tablet (50 mg total) by mouth every 6 (six) hours as needed for moderate pain (pain score 4-6).   vitamin B-12 500 MCG tablet Commonly known as: CYANOCOBALAMIN  Take 500 mcg by mouth every other day.   Vitamin D  50 MCG (2000 UT) tablet Take 2,000 Units by mouth every other day.        Discharge Instructions: [facility] Discharge instructions Lower Extremity Bypass Surgery  Please refer to the following instruction for your post-procedure care. Your surgeon or physician assistant will discuss any changes with you.  Activity  You are encouraged to walk as much as you can. You can slowly return to normal activities during the month after your surgery. Avoid strenuous activity and heavy lifting until your doctor tells you it's OK. Avoid activities such as vacuuming or swinging a golf club. Do not drive until your doctor give the OK and you are no longer taking prescription pain medications. It is also normal to have difficulty with sleep habits, eating and bowel movement after surgery. These will go away with time.  Bathing/Showering  You may shower after you go home. Do not soak in a bathtub, hot tub, or swim until the incision heals completely.  Incision Care  Clean your incision with mild soap and water. Shower every day. Pat the area dry with a clean towel. You do not need a bandage unless otherwise instructed. Do not apply any ointments or creams to  your incision. If you have open wounds you will be instructed how to care for them or a visiting nurse may be arranged for you. If you have staples or sutures along your incision they will be removed at your post-op appointment. You may have skin glue on your incision. Do not peel it off. It will come off on its own in about one week.  Keep Pravena wound vac on your groin incision until it loses it seal in about 7-10 days.  Once that happens, you can remove and then wash the groin wound with soap and water daily and pat dry. (No tub bath-only shower)  Then put a dry gauze or washcloth in the groin to keep this area dry to help prevent wound infection.  Do this daily and as needed.  Do not use Vaseline or neosporin on your incisions.  Only use soap and water on your incisions and then protect and keep dry.   Diet  Resume your normal diet. There are no special food restrictions following this procedure. A low fat/ low cholesterol diet is recommended for all patients with vascular disease. In order to heal from your surgery, it is CRITICAL to get adequate nutrition. Your body requires vitamins, minerals, and protein. Vegetables are the best source of vitamins and minerals. Vegetables also provide the perfect balance of protein. Processed food has little nutritional value, so try to avoid this.  Medications  Resume taking all your medications unless your doctor or Physician Assistant tells you not to. If your incision is causing pain, you may take over-the-counter pain relievers such as acetaminophen  (Tylenol ). If you were prescribed a stronger pain medication, please aware these medication can cause nausea and constipation. Prevent nausea by taking the medication with a snack or meal. Avoid constipation  by drinking plenty of fluids and eating foods with high amount of fiber, such as fruits, vegetables, and grains. Take Colace 100 mg (an over-the-counter stool softener) twice a day as needed for constipation.   Do not take Tylenol  if you are taking prescription pain medications.  Follow Up  Our office will schedule a follow up appointment 2-3 weeks following discharge.  Please call us  immediately for any of the following conditions  Severe or worsening pain in your legs or feet while at rest or while walking Increase pain, redness, warmth, or drainage (pus) from your incision site(s) Fever of 101 degree or higher The swelling in your leg with the bypass suddenly worsens and becomes more painful than when you were in the hospital If you have been instructed to feel your graft pulse then you should do so every day. If you can no longer feel this pulse, call the office immediately. Not all patients are given this instruction.  Leg swelling is common after leg bypass surgery.  The swelling should improve over a few months following surgery. To improve the swelling, you may elevate your legs above the level of your heart while you are sitting or resting. Your surgeon or physician assistant may ask you to apply an ACE wrap or wear compression (TED) stockings to help to reduce swelling.  Reduce your risk of vascular disease  Stop smoking. If you would like help call [facility] at 1-800-QUIT-NOW (878-661-0382) or [place] at [phone].  Manage your cholesterol Maintain a desired weight Control your diabetes weight Control your diabetes Keep your blood pressure down  If you have any questions, please call the office at [phone]   Prescriptions given: 1.  Tramadol #20 No Refill 2.  Crestor 10mg  daily #30 with 11 refills **both sent to [facility] pharmacy  Disposition: home  Patient's condition: is Good  Follow up: 1. [facility] in 2-3 weeks   Maryanna Smart, PA-C [facility] [phone] 11/24/2023  10:08 AM  - For VQI Registry use ---   Post-op:  Wound infection: No  Graft infection: No  Transfusion: No    If yes, n/a units given New Arrhythmia: No Ipsilateral  amputation: No, [ ]  Minor, [ ]  BKA, [ ]  AKA Discharge patency: [x ] Primary, [ ]  Primary assisted, [ ]  Secondary, [ ]  Occluded Patency judged by: [ ]  Dopper only, [ ]  Palpable graft pulse, [x]  Palpable distal pulse, [ ]  ABI inc. > 0.15, [ ]  Duplex Discharge ABI: R not done, L  D/C Ambulatory Status: Ambulatory  Complications: MI: No, [ ]  Troponin only, [ ]  EKG or Clinical CHF: No Resp failure:No, [ ]  Pneumonia, [ ]  Ventilator Chg in renal function: No, [ ]  Inc. Cr > 0.5, [ ]  Temp. Dialysis,  [ ]  Permanent dialysis Stroke: No, [ ]  Minor, [ ]  Major Return to OR: No  Reason for return to OR: [ ]  Bleeding, [ ]  Infection, [ ]  Thrombosis, [ ]  Revision  Discharge medications: Statin use:  yes ASA use:  no Plavix  use:  yes Beta blocker use: yes CCB use:  No ACEI use:   yes ARB use:  no Coumadin  use: no Eliquis :  yes

## 2023-11-23 NOTE — Progress Notes (Signed)
 PT Cancellation Note  Patient Details Name: LYCAN DAVEE MRN: [id number] DOB: 1940/08/03   Cancelled Treatment:    Reason Eval/Treat Not Completed: (P) Patient declined, no reason specified, pt declining all mobility, reporting nausea, max encouragement provided, however pt continuing to decline session. Will check back as schedule allows to continue with PT POC.  Beverly Buckler. PTA Acute Rehabilitation Services Office: [phone]    Agapito Horseman 11/23/2023, 9:50 AM

## 2023-11-23 NOTE — Progress Notes (Signed)
 Mobility Specialist Progress Note:    11/23/23 1437  Mobility  Activity Ambulated with assistance to bathroom  Level of Assistance Contact guard assist, steadying assist  Assistive Device Front wheel walker  Distance Ambulated (ft) 25 ft  Activity Response Tolerated well  Mobility Referral Yes  Mobility visit 1 Mobility  Mobility Specialist Start Time (ACUTE ONLY) 1431  Mobility Specialist Stop Time (ACUTE ONLY) 1436  Mobility Specialist Time Calculation (min) (ACUTE ONLY) 5 min   Pt received in bathroom, requesting assistance to ambulate back to chair. Required MinG with RW to ambulate. Pt c/o LE discomfort, resulting in a limp. Tolerated well. Returned to chair, all needs met, call bell in reach, and chair alarm on.   He[facility]to [facility] Mobility Specialist Please contact via Special educational needs teacher or  Rehab office at [phone]

## 2023-11-23 NOTE — Progress Notes (Addendum)
 Progress Note    11/23/2023 6:44 AM 3 Days Post-Op  Subjective:  having trouble using his flip phone.  Wants to sit up.  Says he walked in the room to the bathroom a few times.   Afebrile HR 60's-80's  160's-170's systolic 96% RA  Vitals:   11/22/23 2235 11/23/23 0355  BP: (!) 170/58 (!) 161/60  Pulse: 76 92  Resp: 20 20  Temp: 98.5 F (36.9 C) 98.8 F (37.1 C)  SpO2: 95% 96%    Physical Exam: General:  no distress Cardiac:  regular Lungs:  non labored Incisions:  right groin with prevena vac with good seal; right lower leg incision looks good.  Extremities:  palpable right PT pulse   CBC    Component Value Date/Time   WBC 10.9 (H) 11/23/2023 0352   RBC 3.38 (L) 11/23/2023 0352   HGB 10.6 (L) 11/23/2023 0352   HGB 11.0 (L) 12/05/2020 1407   HCT 32.4 (L) 11/23/2023 0352   HCT 35.5 (L) 12/05/2020 1407   PLT 203 11/23/2023 0352   PLT 254 12/05/2020 1407   MCV 95.9 11/23/2023 0352   MCV 85 12/05/2020 1407   MCH 31.4 11/23/2023 0352   MCHC 32.7 11/23/2023 0352   RDW 12.2 11/23/2023 0352   RDW 20.4 (H) 12/05/2020 1407   LYMPHSABS 2.0 09/10/2022 1807   MONOABS 1.1 (H) 09/10/2022 1807   EOSABS 0.1 09/10/2022 1807   BASOSABS 0.1 09/10/2022 1807    BMET    Component Value Date/Time   NA 139 11/21/2023 0026   NA 139 12/05/2020 1407   K 3.4 (L) 11/21/2023 0026   CL 102 11/21/2023 0026   CO2 23 11/21/2023 0026   GLUCOSE 154 (H) 11/21/2023 0026   BUN 18 11/21/2023 0026   BUN 25 12/05/2020 1407   CREATININE 1.41 (H) 11/21/2023 0026   CREATININE 1.51 (H) 08/31/2020 1046   CALCIUM  7.7 (L) 11/21/2023 0026   GFRNONAA 49 (L) 11/21/2023 0026   GFRNONAA 46 (L) 08/31/2020 1046   GFRAA 52 (L) 01/19/2020 0547    INR    Component Value Date/Time   INR 1.3 (H) 11/11/2023 0930     Intake/Output Summary (Last 24 hours) at 11/23/2023 0644 Last data filed at 11/22/2023 1935 Gross per 24 hour  Intake 440 ml  Output 440 ml  Net 0 ml      Assessment/Plan:  83  y.o. male is s/p:  Right femoral to PT bypass with PTFE, with right PTA patch angioplasty, right distal EIA stenting, myriad placement and Prevena vac on 11/20/2023 by Dr. Rosalva Comber  3 Days Post-Op   -continues to have palpable right PT pulse and lower leg incision looks good.  Right groin with prevena with good seal -plan to work with PT again today.   -acute blood loss anemia-hgb stable this morning at 10.6 (up from 10.3 yesterday). WBC stable at 10.9k.  -DVT prophylaxis:  Eliquis  -continue plavix Donald Gordon   Donald Smart, PA-C [facility] [phone] 11/23/2023 6:44 AM  I have seen and evaluated the patient. I agree with the PA note as documented above.  Continues to have a palpable PT pulse at the ankle.  All of his incisions look good.  Eliquis  and Plavix .  Leave incisional VAC for 1 week in the groin per Dr. Rosalva Comber.  Will work with therapy more today.  Will see how he does with discharge either today or tomorrow.  Donald Hensen, MD [facility] [place] [place] Office: [phone]

## 2023-11-23 NOTE — TOC Initial Note (Addendum)
 Transition of Care [facility]) - Initial/Assessment Note    Patient Details  Name: Donald Gordon MRN: [id number] Date of Birth: [birth year]  Transition of Care [facility]) CM/SW Contact:    Valery Gaucher, LCSW Phone Number: 11/23/2023, 3:32 PM  Clinical Narrative:                  CSW met with patient at bedside. CSW introduced self and explained role. Patient states he lives home alone. Patient states he has been to [facility] in the past. CSW explained [facility]. Patient states he understands and wants to CSW to pursue short term rehab authorization from the [place]. CSW explained  the SNF/ [facility] approval process ( it can 1-3 days for approval). CSW explained bed search will start once approved.   Patient receives services at the [facility] w/ Dr. Evon Hoguet. CSW is Ms Chauncey Cora [phone], ext [phone]. He is 60% service connected.   Patient declined PT today, will wait to see if the recommendations remains the same- since patient has been mobilizing to the rest room.  TOC will continue to follow and assist with discharge planning  Liddie Reel, MSW, LCSW Clinical Social Worker    Expected Discharge Plan: Skilled Nursing Facility Barriers to Discharge: English as a second language teacher, Continued Medical Work up, SNF Pending bed offer ([facility] Approval)   Patient Goals and CMS Choice            Expected Discharge Plan and Services In-house Referral: Clinical Social Work     Living arrangements for the past 2 months: Single Family Home                                      Prior Living Arrangements/Services Living arrangements for the past 2 months: Single Family Home Lives with:: Self Patient language and need for interpreter reviewed:: No        Need for Family Participation in Patient Care: Yes (Comment) Care giver support system in place?: Yes (comment)   Criminal Activity/Legal Involvement Pertinent to Current Situation/Hospitalization: No - Comment as  needed  Activities of Daily Living   ADL Screening (condition at time of admission) Independently performs ADLs?: Yes (appropriate for developmental age) Is the patient deaf or have difficulty hearing?: No Does the patient have difficulty seeing, even when wearing glasses/contacts?: No Does the patient have difficulty concentrating, remembering, or making decisions?: No  Permission Sought/Granted Permission sought to share information with : Family Supports Permission granted to share information with : Yes, Verbal Permission Granted  Share Information with NAME: Albertha Alosa  Permission granted to share info w AGENCY: SNF  Permission granted to share info w Relationship: daughter  Permission granted to share info w Contact Information: [phone]  Emotional Assessment Appearance:: Appears older than stated age Attitude/Demeanor/Rapport: Engaged Affect (typically observed): Accepting, Appropriate Orientation: : Oriented to Self, Oriented to Place, Oriented to  Time, Oriented to Situation Alcohol / Substance Use: Not Applicable Psych Involvement: No (comment)  Admission diagnosis:  Atherosclerosis of native artery of right lower extremity with rest pain (HCC) [I70.221] Status post femoral-popliteal bypass surgery [N62.952] 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) [I70.221] Patient Active Problem List   Diagnosis Date Noted   Status post femoral-popliteal bypass surgery 11/20/2023   Malignant neoplasm of lower lobe of right lung (HCC) 08/19/2023   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) 09/10/2022  Chronic kidney disease, stage 3a (HCC) 09/10/2022   Secondary hypercoagulable state (HCC) 01/07/2021   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) 09/03/2020   Right lower lobe pulmonary nodule 09/03/2020   Thrombosis of femoro-popliteal bypass graft (HCC) 06/02/2020   Chest pain 05/18/2020   Mixed diabetic hyperlipidemia  associated with type 2 diabetes mellitus (HCC) 05/18/2020   Acute renal failure superimposed on stage 3a chronic kidney disease (HCC) 05/18/2020   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 esophagitis 05/18/2020   Chronic diastolic (congestive) heart failure (HCC) 05/18/2020   Critical limb ischemia with history of revascularization of same extremity (HCC) 01/18/2020   Critical lower limb ischemia (HCC) 08/31/2019   PAD (peripheral artery disease) (HCC) 07/23/2019   Ischemia of right lower extremity 01/09/2018   Critical ischemia of foot (HCC) 01/09/2018   Atrial fibrillation (HCC) 07/04/2014   Atrial fibrillation with rapid ventricular response (HCC)    Paroxysmal atrial fibrillation (HCC) 07/02/2014   Essential hypertension 07/02/2014   Atherosclerosis of native coronary artery of native heart with angina pectoris (HCC) 07/02/2014   Cough 07/02/2014   Atrial fibrillation with RVR (HCC) 07/02/2014   Type 2 diabetes mellitus with stage 3a chronic kidney disease, without long-term current use of insulin  (HCC) 07/02/2014   [facility]P:  [facility]   [facility] - [place], [place] - [address] [place] [address] Phone: [phone] Fax: [phone]  [id number] - [place], [place] - [address] [place] [address] Phone: [phone] Fax: [phone]     Social Drivers of Health (SDOH) Social History: SDOH Screenings   Food Insecurity: No Food Insecurity (11/20/2023)  Housing: Low Risk  (11/20/2023)  Transportation Needs: No Transportation Needs (11/20/2023)  Utilities: Not At Risk (11/20/2023)  Social Connections: Unknown (11/20/2023)  Stress: No Stress Concern Present (10/16/2022)   Received from [facility]  Tobacco Use: Medium Risk (11/20/2023)   SDOH Interventions:     Readmission Risk Interventions     No data to display

## 2023-11-23 NOTE — Progress Notes (Signed)
 Physical Therapy Treatment Patient Details Name: Donald Gordon MRN: [id number] DOB: June 19, 1941 Today's Date: 11/23/2023   History of Present Illness Pt is an 83 y/o M s/p R fempop bympass on 5/30. PMH includes CKD III, DM, CHF    PT Comments  Pt resting in bed on arrival and agreeable to session with steady progress towards acute goals, however pt continues to be limited in safe mobility by decreased activity tolerance, poor balance/postural reactions and pain. Pt requiring min A to complete bed mobility and min A to steady throughout gait with RW for support. Pt continues to require cues for safety with ambulation, optimal hand placement on RW and assist for RW management. Pt agreeable to time up in chair at end of session and expressing appreciation for therapies. Educated pt on importance of continued mobility and appropriate activity progression with pt verbalizing understanding. Patient will benefit from continued inpatient follow up therapy, <3 hours/day, will continue to follow acutely.     If plan is discharge home, recommend the following: A little help with bathing/dressing/bathroom;Assistance with cooking/housework;Assist for transportation;Help with stairs or ramp for entrance   Can travel by private vehicle     No  Equipment Recommendations  None recommended by PT    Recommendations for Other Services       Precautions / Restrictions Precautions Precautions: Other (comment) Precaution/Restrictions Comments: preveena R groin Restrictions Weight Bearing Restrictions Per Provider Order: No     Mobility  Bed Mobility Overal bed mobility: Needs Assistance Bed Mobility: Supine to Sit     Supine to sit: Min assist     General bed mobility comments: min A to elevate trunk to sitting, despite encouragement o complete without assist to simulate home    Transfers Overall transfer level: Needs assistance Equipment used: Rolling walker (2 wheels) Transfers: Sit to/from  Stand Sit to Stand: Contact guard assist, From elevated surface           General transfer comment: cga for safety    Ambulation/Gait Ambulation/Gait assistance: Min assist Gait Distance (Feet): 106 Feet Assistive device: Rolling walker (2 wheels) Gait Pattern/deviations: Step-to pattern, Decreased step length - left, Decreased step length - right, Decreased stance time - right, Decreased weight shift to right, Trunk flexed, Wide base of support, Step-through pattern Gait velocity: decr     General Gait Details: step to progressing to step-through, pt resting forearms on RW at times needed cues to stand up and place hands on grips, max cues to remain inside RW during turning, pt more receptive to cues this session and attempting to correct min A to maintain balance especially with turning   Stairs             Wheelchair Mobility     Tilt Bed    Modified Rankin (Stroke Patients Only)       Balance Overall balance assessment: Needs assistance Sitting-balance support: Feet supported Sitting balance-Leahy Scale: Good     Standing balance support: During functional activity, Bilateral upper extremity supported, Reliant on assistive device for balance Standing balance-Leahy Scale: Poor Standing balance comment: relies on RW for stability but no external support needed.                            Communication Communication Communication: No apparent difficulties  Cognition Arousal: Alert Behavior During Therapy: WFL for tasks assessed/performed   PT - Cognitive impairments: No apparent impairments  PT - Cognition Comments: poor insight into current deficits and how that will impact his ability to care for himself home alone Following commands: Intact      Cueing Cueing Techniques: Verbal cues  Exercises      General Comments General comments (skin integrity, edema, etc.): VSS on RA      Pertinent Vitals/Pain  Pain Assessment Pain Assessment: Faces Faces Pain Scale: Hurts little more Pain Location: RLE Pain Descriptors / Indicators: Discomfort Pain Intervention(s): Monitored during session, Limited activity within patient's tolerance    Home Living                          Prior Function            PT Goals (current goals can now be found in the care plan section) Acute Rehab PT Goals Patient Stated Goal: to go home and get rid of pain PT Goal Formulation: With patient Time For Goal Achievement: 12/06/23 Progress towards PT goals: Progressing toward goals    Frequency    Min 2X/week      PT Plan      Co-evaluation              AM-PAC PT "6 Clicks" Mobility   Outcome Measure  Help needed turning from your back to your side while in a flat bed without using bedrails?: A Little Help needed moving from lying on your back to sitting on the side of a flat bed without using bedrails?: A Little Help needed moving to and from a bed to a chair (including a wheelchair)?: A Little Help needed standing up from a chair using your arms (e.g., wheelchair or bedside chair)?: A Little Help needed to walk in hospital room?: A Little Help needed climbing 3-5 steps with a railing? : Total 6 Click Score: 16    End of Session Equipment Utilized During Treatment: Gait belt Activity Tolerance: Patient tolerated treatment well Patient left: with call bell/phone within reach;in chair;with chair alarm set;with family/visitor present (with lumch tray set up) Nurse Communication: Mobility status PT Visit Diagnosis: Muscle weakness (generalized) (M62.81);Pain Pain - Right/Left: Right Pain - part of body: Leg     Time: 1610-9604 PT Time Calculation (min) (ACUTE ONLY): 18 min  Charges:    $Gait Training: 8-22 mins PT General Charges $$ ACUTE PT VISIT: 1 Visit                     Aliyanna Wassmer R. PTA Acute Rehabilitation Services Office: [phone]   Agapito Horseman 11/23/2023, 4:02  PM

## 2023-11-24 ENCOUNTER — Other Ambulatory Visit (HOSPITAL_COMMUNITY): Payer: Self-pay

## 2023-11-24 MED ORDER — ROSUVASTATIN CALCIUM 10 MG PO TABS
10.0000 mg | ORAL_TABLET | Freq: Every day | ORAL | 11 refills | Status: AC
  Filled 2023-11-24: qty 30, 30d supply, fill #0

## 2023-11-24 MED ORDER — TRAMADOL HCL 50 MG PO TABS
50.0000 mg | ORAL_TABLET | Freq: Four times a day (QID) | ORAL | 0 refills | Status: DC | PRN
  Filled 2023-11-24: qty 20, 5d supply, fill #0

## 2023-11-24 NOTE — Progress Notes (Addendum)
  Progress Note    11/24/2023 6:45 AM 4 Days Post-Op  Subjective:  no complaints; says he is going home and not rehab  Tm 99.1 now afebrile HR 60's-100's  130's-150's systolic 96% RA  Vitals:   11/23/23 2329 11/24/23 0246  BP: (!) 159/57 135/62  Pulse: 86 60  Resp: 19 13  Temp: 98.1 F (36.7 C) 97.8 F (36.6 C)  SpO2: 93% 96%    Physical Exam: General:  no distress Cardiac:  regular Lungs:  non labored Incisions:  right groin with prevena with good seal.  Right lower leg incisions look good.  Extremities:  palpable left DP pulse and palpable right PT pulse   CBC    Component Value Date/Time   WBC 10.9 (H) 11/23/2023 0352   RBC 3.38 (L) 11/23/2023 0352   HGB 10.6 (L) 11/23/2023 0352   HGB 11.0 (L) 12/05/2020 1407   HCT 32.4 (L) 11/23/2023 0352   HCT 35.5 (L) 12/05/2020 1407   PLT 203 11/23/2023 0352   PLT 254 12/05/2020 1407   MCV 95.9 11/23/2023 0352   MCV 85 12/05/2020 1407   MCH 31.4 11/23/2023 0352   MCHC 32.7 11/23/2023 0352   RDW 12.2 11/23/2023 0352   RDW 20.4 (H) 12/05/2020 1407   LYMPHSABS 2.0 09/10/2022 1807   MONOABS 1.1 (H) 09/10/2022 1807   EOSABS 0.1 09/10/2022 1807   BASOSABS 0.1 09/10/2022 1807    BMET    Component Value Date/Time   NA 139 11/21/2023 0026   NA 139 12/05/2020 1407   K 3.4 (L) 11/21/2023 0026   CL 102 11/21/2023 0026   CO2 23 11/21/2023 0026   GLUCOSE 154 (H) 11/21/2023 0026   BUN 18 11/21/2023 0026   BUN 25 12/05/2020 1407   CREATININE 1.41 (H) 11/21/2023 0026   CREATININE 1.51 (H) 08/31/2020 1046   CALCIUM  7.7 (L) 11/21/2023 0026   GFRNONAA 49 (L) 11/21/2023 0026   GFRNONAA 46 (L) 08/31/2020 1046   GFRAA 52 (L) 01/19/2020 0547    INR    Component Value Date/Time   INR 1.3 (H) 11/11/2023 0930     Intake/Output Summary (Last 24 hours) at 11/24/2023 0645 Last data filed at 11/23/2023 1637 Gross per 24 hour  Intake 480.95 ml  Output 750 ml  Net -269.05 ml      Assessment/Plan:  83 y.o. male is s/p:   Right femoral to PT bypass with PTFE, with right PTA patch angioplasty, right distal EIA stenting, myriad placement and Prevena vac on 11/20/2023 by Dr. Rosalva Comber   4 Days Post-Op   -palpable pedal pulses -DVT prophylaxis:  Eliquis  -PT recommending SNF as of 6/2-pt states he is going home -continue plavix Donald Gordon   Donald Smart, PA-C [facility] [phone] 11/24/2023 6:45 AM  I have seen and evaluated the patient. I agree with the PA note as documented above.  Status post right common femoral to PT bypass on Friday.  Continues to have a palpable PT pulse at the ankle.  On Plavix  Eliquis .  PT is recommending SNF but patient declines.  States he has plenty of support at home.  Will plan tentative discharge today.  Discussed leaving the VAC in place for 1 week with the Prevena.  Will arrange short order follow-up in 2 to 3 weeks.  Incisions look good.  Donald Hensen, MD [facility] [place] Office: [phone]

## 2023-11-24 NOTE — TOC Progression Note (Addendum)
 Transition of Care [facility]) - Progression Note    Patient Details  Name: Donald Gordon MRN: [id number] Date of Birth: 1941/02/24  Transition of Care [facility]) CM/SW Contact  Valery Gaucher, [place] Phone Number: 11/24/2023, 3:27 PM  Clinical Narrative:     Received call from [facility]- she advised if patient wanted to discharge to SNF - he must use his Medicare benefits.   Liddie Reel, MSW, LCSW Clinical Social Worker    Expected Discharge Plan: Skilled Nursing Facility Barriers to Discharge: Barriers Resolved  Expected Discharge Plan and Services In-house Referral: Clinical Social Work Discharge Planning Services: CM Consult Post Acute Care Choice: Home Health, Skilled Nursing Facility Living arrangements for the past 2 months: Single Family Home Expected Discharge Date: 11/24/23               DME Arranged: N/A DME Agency: NA       HH Arranged: NA, Patient Refused HH HH Agency: NA         Social Determinants of Health (SDOH) Interventions SDOH Screenings   Food Insecurity: No Food Insecurity (11/20/2023)  Housing: Low Risk  (11/20/2023)  Transportation Needs: No Transportation Needs (11/20/2023)  Utilities: Not At Risk (11/20/2023)  Social Connections: Unknown (11/20/2023)  Stress: No Stress Concern Present (10/16/2022)   Received from [facility]  Tobacco Use: Medium Risk (11/20/2023)    Readmission Risk Interventions    11/24/2023   11:16 AM  Readmission Risk Prevention Plan  Transportation Screening Complete  Home Care Screening Complete  Medication Review (RN CM) Complete

## 2023-11-24 NOTE — TOC Transition Note (Signed)
 Transition of Care (TOC) - Discharge Note Sherin Dingwall RN, BSN Transitions of Care Unit 4E- RN Case Manager See Treatment Team for direct phone #   Patient Details  Name: Donald Gordon MRN: [id number] Date of Birth: 29-Jun-1940  Transition of Care [facility]) CM/SW Contact:  Rox Cope, RN Phone Number: 11/24/2023, 11:16 AM   Clinical Narrative:    Noted pt has decided to return home and no longer wants to go to rehab. D/C order has been placed- pt stable to transition home today.   CM spoke with pt at bedside, discussed HH and DME- per pt he has RW at home- declines any new DME needs. Pt voiced he does not feel he will need HH services- pt states he discussed with MD this am and it was agreed that he did not need HH. Pt will go home with prevena VAC.  No HH orders placed for discharge and pt declined HH needs.   No further TOC needs noted at this time. Pt voiced he has transportation home.    Final next level of care: Home/Self Care Barriers to Discharge: Barriers Resolved   Patient Goals and CMS Choice Patient states their goals for this hospitalization and ongoing recovery are:: return home   Choice offered to / list presented to : Patient      Discharge Placement                 Home      Discharge Plan and Services Additional resources added to the After Visit Summary for   In-house Referral: Clinical Social Work Discharge Planning Services: CM Consult Post Acute Care Choice: Home Health, Skilled Nursing Facility          DME Arranged: N/A DME Agency: NA       HH Arranged: NA, Patient Refused HH HH Agency: NA        Social Drivers of Health (SDOH) Interventions SDOH Screenings   Food Insecurity: No Food Insecurity (11/20/2023)  Housing: Low Risk  (11/20/2023)  Transportation Needs: No Transportation Needs (11/20/2023)  Utilities: Not At Risk (11/20/2023)  Social Connections: Unknown (11/20/2023)  Stress: No Stress Concern Present (10/16/2022)    [facility]  Tobacco Use: Medium Risk (11/20/2023)     Readmission Risk Interventions    11/24/2023   11:16 AM  Readmission Risk Prevention Plan  Transportation Screening Complete  Home Care Screening Complete  Medication Review (RN CM) Complete

## 2023-11-24 NOTE — Plan of Care (Signed)

## 2023-11-24 NOTE — Progress Notes (Signed)
 Physical Therapy Treatment Patient Details Name: Donald Gordon MRN: [id number] DOB: 12-18-1940 Today's Date: 11/24/2023   History of Present Illness Pt is an 83 y/o M s/p R fempop bympass on 5/30. PMH includes CKD III, DM, CHF    PT Comments  Pt up in chair on arrival and agreeable to session with steady progress towards acute goals. Pt able to come to standing with CGA for safety and demonstrate ascent/descent of 1 step x2 to simulate entry to home with cues for optimal LE sequencing. Pt declining further gait trial, internally distracted with desire to rest, despite max encouragement and education on importance of mobility to maintain endurance and strength. Pt continues to benefit from skilled PT services to progress toward functional mobility goals.       If plan is discharge home, recommend the following: A little help with bathing/dressing/bathroom;Assistance with cooking/housework;Assist for transportation;Help with stairs or ramp for entrance   Can travel by private vehicle     No  Equipment Recommendations  None recommended by PT    Recommendations for Other Services       Precautions / Restrictions Precautions Precautions: Other (comment) Precaution/Restrictions Comments: preveena R groin Restrictions Weight Bearing Restrictions Per Provider Order: No     Mobility  Bed Mobility Overal bed mobility: Needs Assistance             General bed mobility comments: up in chair on arrival    Transfers Overall transfer level: Needs assistance Equipment used: Rolling walker (2 wheels) Transfers: Sit to/from Stand Sit to Stand: Contact guard assist, From elevated surface           General transfer comment: CGA for safety    Ambulation/Gait Ambulation/Gait assistance: Min assist Gait Distance (Feet): 3 Feet Assistive device: Rolling walker (2 wheels) Gait Pattern/deviations: Step-to pattern, Decreased step length - left, Decreased step length - right, Decreased  stance time - right, Decreased weight shift to right, Trunk flexed, Wide base of support, Step-through pattern Gait velocity: decr     General Gait Details: taking steps forward back from recliner to single step in room, pt declining further gait trials   Stairs             Wheelchair Mobility     Tilt Bed    Modified Rankin (Stroke Patients Only)       Balance Overall balance assessment: Needs assistance Sitting-balance support: Feet supported Sitting balance-Leahy Scale: Good     Standing balance support: During functional activity, Bilateral upper extremity supported, Reliant on assistive device for balance Standing balance-Leahy Scale: Poor Standing balance comment: relies on RW for stability but no external support needed.                            Communication Communication Communication: No apparent difficulties  Cognition Arousal: Alert Behavior During Therapy: WFL for tasks assessed/performed   PT - Cognitive impairments: No apparent impairments                       PT - Cognition Comments: poor insight into current deficits and how that will impact his ability to care for himself home alone Following commands: Intact      Cueing Cueing Techniques: Verbal cues  Exercises      General Comments General comments (skin integrity, edema, etc.): VSS on RA      Pertinent Vitals/Pain Pain Assessment Pain Assessment: Faces Faces Pain Scale: Hurts a  little bit Pain Location: RLE Pain Descriptors / Indicators: Discomfort Pain Intervention(s): Monitored during session, Limited activity within patient's tolerance    Home Living                          Prior Function            PT Goals (current goals can now be found in the care plan section) Acute Rehab PT Goals Patient Stated Goal: to go home and get rid of pain PT Goal Formulation: With patient Time For Goal Achievement: 12/06/23 Progress towards PT goals:  Progressing toward goals    Frequency    Min 2X/week      PT Plan      Co-evaluation              AM-PAC PT "6 Clicks" Mobility   Outcome Measure  Help needed turning from your back to your side while in a flat bed without using bedrails?: A Little Help needed moving from lying on your back to sitting on the side of a flat bed without using bedrails?: A Little Help needed moving to and from a bed to a chair (including a wheelchair)?: A Little Help needed standing up from a chair using your arms (e.g., wheelchair or bedside chair)?: A Little Help needed to walk in hospital room?: A Little Help needed climbing 3-5 steps with a railing? : A Little 6 Click Score: 18    End of Session Equipment Utilized During Treatment: Gait belt Activity Tolerance: Patient tolerated treatment well Patient left: with call bell/phone within reach;in chair Nurse Communication: Mobility status PT Visit Diagnosis: Muscle weakness (generalized) (M62.81);Pain Pain - Right/Left: Right Pain - part of body: Leg     Time: 1610-9604 PT Time Calculation (min) (ACUTE ONLY): 11 min  Charges:    $Gait Training: 8-22 mins PT General Charges $$ ACUTE PT VISIT: 1 Visit                     Arriah Wadle R. PTA Acute Rehabilitation Services Office: [phone]   Agapito Horseman 11/24/2023, 11:52 AM

## 2023-12-06 ENCOUNTER — Inpatient Hospital Stay (HOSPITAL_COMMUNITY)
Admission: EM | Admit: 2023-12-06 | Discharge: 2023-12-10 | DRG: 069 | Disposition: A | Discharge status: Skilled Nursing Facility | Attending: Internal Medicine | Admitting: Internal Medicine

## 2023-12-06 ENCOUNTER — Emergency Department (HOSPITAL_COMMUNITY)

## 2023-12-06 ENCOUNTER — Other Ambulatory Visit: Payer: Self-pay

## 2023-12-06 ENCOUNTER — Encounter (HOSPITAL_COMMUNITY): Payer: Self-pay

## 2023-12-06 DIAGNOSIS — M542 Cervicalgia: Secondary | ICD-10-CM | POA: Diagnosis present

## 2023-12-06 DIAGNOSIS — K219 Gastro-esophageal reflux disease without esophagitis: Secondary | ICD-10-CM | POA: Diagnosis present

## 2023-12-06 DIAGNOSIS — Z86718 Personal history of other venous thrombosis and embolism: Secondary | ICD-10-CM

## 2023-12-06 DIAGNOSIS — W1830XA Fall on same level, unspecified, initial encounter: Secondary | ICD-10-CM | POA: Diagnosis present

## 2023-12-06 DIAGNOSIS — R7989 Other specified abnormal findings of blood chemistry: Secondary | ICD-10-CM | POA: Diagnosis present

## 2023-12-06 DIAGNOSIS — R197 Diarrhea, unspecified: Secondary | ICD-10-CM | POA: Diagnosis present

## 2023-12-06 DIAGNOSIS — Y92019 Unspecified place in single-family (private) house as the place of occurrence of the external cause: Secondary | ICD-10-CM

## 2023-12-06 DIAGNOSIS — Z7901 Long term (current) use of anticoagulants: Secondary | ICD-10-CM

## 2023-12-06 DIAGNOSIS — Z7409 Other reduced mobility: Secondary | ICD-10-CM | POA: Diagnosis present

## 2023-12-06 DIAGNOSIS — N1831 Chronic kidney disease, stage 3a: Secondary | ICD-10-CM | POA: Diagnosis present

## 2023-12-06 DIAGNOSIS — I130 Hypertensive heart and chronic kidney disease with heart failure and stage 1 through stage 4 chronic kidney disease, or unspecified chronic kidney disease: Secondary | ICD-10-CM | POA: Diagnosis present

## 2023-12-06 DIAGNOSIS — Z981 Arthrodesis status: Secondary | ICD-10-CM

## 2023-12-06 DIAGNOSIS — G4733 Obstructive sleep apnea (adult) (pediatric): Secondary | ICD-10-CM | POA: Diagnosis present

## 2023-12-06 DIAGNOSIS — Z888 Allergy status to other drugs, medicaments and biological substances status: Secondary | ICD-10-CM

## 2023-12-06 DIAGNOSIS — E860 Dehydration: Secondary | ICD-10-CM | POA: Diagnosis present

## 2023-12-06 DIAGNOSIS — S51012A Laceration without foreign body of left elbow, initial encounter: Secondary | ICD-10-CM | POA: Diagnosis present

## 2023-12-06 DIAGNOSIS — Z8673 Personal history of transient ischemic attack (TIA), and cerebral infarction without residual deficits: Secondary | ICD-10-CM

## 2023-12-06 DIAGNOSIS — R630 Anorexia: Secondary | ICD-10-CM | POA: Diagnosis present

## 2023-12-06 DIAGNOSIS — Z6824 Body mass index (BMI) 24.0-24.9, adult: Secondary | ICD-10-CM

## 2023-12-06 DIAGNOSIS — I6789 Other cerebrovascular disease: Principal | ICD-10-CM | POA: Diagnosis present

## 2023-12-06 DIAGNOSIS — R531 Weakness: Secondary | ICD-10-CM | POA: Diagnosis not present

## 2023-12-06 DIAGNOSIS — D631 Anemia in chronic kidney disease: Secondary | ICD-10-CM | POA: Diagnosis present

## 2023-12-06 DIAGNOSIS — Z8249 Family history of ischemic heart disease and other diseases of the circulatory system: Secondary | ICD-10-CM

## 2023-12-06 DIAGNOSIS — Z79899 Other long term (current) drug therapy: Secondary | ICD-10-CM

## 2023-12-06 DIAGNOSIS — Z7902 Long term (current) use of antithrombotics/antiplatelets: Secondary | ICD-10-CM

## 2023-12-06 DIAGNOSIS — I4891 Unspecified atrial fibrillation: Secondary | ICD-10-CM | POA: Diagnosis present

## 2023-12-06 DIAGNOSIS — E876 Hypokalemia: Secondary | ICD-10-CM

## 2023-12-06 DIAGNOSIS — I452 Bifascicular block: Secondary | ICD-10-CM | POA: Diagnosis present

## 2023-12-06 DIAGNOSIS — Z955 Presence of coronary angioplasty implant and graft: Secondary | ICD-10-CM

## 2023-12-06 DIAGNOSIS — I480 Paroxysmal atrial fibrillation: Secondary | ICD-10-CM | POA: Diagnosis present

## 2023-12-06 DIAGNOSIS — I739 Peripheral vascular disease, unspecified: Secondary | ICD-10-CM | POA: Diagnosis present

## 2023-12-06 DIAGNOSIS — Z87891 Personal history of nicotine dependence: Secondary | ICD-10-CM

## 2023-12-06 DIAGNOSIS — E1151 Type 2 diabetes mellitus with diabetic peripheral angiopathy without gangrene: Secondary | ICD-10-CM | POA: Diagnosis present

## 2023-12-06 DIAGNOSIS — M11212 Other chondrocalcinosis, left shoulder: Secondary | ICD-10-CM | POA: Diagnosis present

## 2023-12-06 DIAGNOSIS — E1122 Type 2 diabetes mellitus with diabetic chronic kidney disease: Secondary | ICD-10-CM | POA: Diagnosis present

## 2023-12-06 DIAGNOSIS — F0390 Unspecified dementia without behavioral disturbance: Secondary | ICD-10-CM | POA: Diagnosis present

## 2023-12-06 DIAGNOSIS — F03A0 Unspecified dementia, mild, without behavioral disturbance, psychotic disturbance, mood disturbance, and anxiety: Secondary | ICD-10-CM | POA: Diagnosis present

## 2023-12-06 DIAGNOSIS — J449 Chronic obstructive pulmonary disease, unspecified: Secondary | ICD-10-CM | POA: Diagnosis present

## 2023-12-06 DIAGNOSIS — R110 Nausea: Secondary | ICD-10-CM | POA: Diagnosis not present

## 2023-12-06 DIAGNOSIS — I5032 Chronic diastolic (congestive) heart failure: Secondary | ICD-10-CM | POA: Diagnosis present

## 2023-12-06 DIAGNOSIS — M549 Dorsalgia, unspecified: Secondary | ICD-10-CM | POA: Diagnosis present

## 2023-12-06 DIAGNOSIS — K5730 Diverticulosis of large intestine without perforation or abscess without bleeding: Secondary | ICD-10-CM | POA: Diagnosis present

## 2023-12-06 DIAGNOSIS — M8580 Other specified disorders of bone density and structure, unspecified site: Secondary | ICD-10-CM | POA: Diagnosis present

## 2023-12-06 DIAGNOSIS — Z95820 Peripheral vascular angioplasty status with implants and grafts: Secondary | ICD-10-CM

## 2023-12-06 DIAGNOSIS — W19XXXA Unspecified fall, initial encounter: Secondary | ICD-10-CM

## 2023-12-06 DIAGNOSIS — N400 Enlarged prostate without lower urinary tract symptoms: Secondary | ICD-10-CM | POA: Diagnosis present

## 2023-12-06 DIAGNOSIS — I70201 Unspecified atherosclerosis of native arteries of extremities, right leg: Secondary | ICD-10-CM | POA: Diagnosis present

## 2023-12-06 DIAGNOSIS — R627 Adult failure to thrive: Secondary | ICD-10-CM

## 2023-12-06 DIAGNOSIS — M19012 Primary osteoarthritis, left shoulder: Secondary | ICD-10-CM | POA: Diagnosis present

## 2023-12-06 DIAGNOSIS — I2510 Atherosclerotic heart disease of native coronary artery without angina pectoris: Secondary | ICD-10-CM | POA: Diagnosis present

## 2023-12-06 DIAGNOSIS — D72829 Elevated white blood cell count, unspecified: Secondary | ICD-10-CM | POA: Diagnosis present

## 2023-12-06 DIAGNOSIS — E785 Hyperlipidemia, unspecified: Secondary | ICD-10-CM | POA: Diagnosis present

## 2023-12-06 DIAGNOSIS — I252 Old myocardial infarction: Secondary | ICD-10-CM

## 2023-12-06 DIAGNOSIS — I10 Essential (primary) hypertension: Secondary | ICD-10-CM | POA: Diagnosis not present

## 2023-12-06 DIAGNOSIS — I609 Nontraumatic subarachnoid hemorrhage, unspecified: Principal | ICD-10-CM

## 2023-12-06 MED ORDER — FENTANYL CITRATE PF 50 MCG/ML IJ SOSY
50.0000 ug | PREFILLED_SYRINGE | Freq: Once | INTRAMUSCULAR | Status: AC
  Administered 2023-12-07: 50 ug via INTRAVENOUS
  Filled 2023-12-06: qty 1

## 2023-12-06 MED ORDER — SODIUM CHLORIDE 0.9 % IV BOLUS
1000.0000 mL | Freq: Once | INTRAVENOUS | Status: AC
  Administered 2023-12-07: 1000 mL via INTRAVENOUS

## 2023-12-06 MED ORDER — ONDANSETRON HCL 4 MG/2ML IJ SOLN
4.0000 mg | Freq: Once | INTRAMUSCULAR | Status: AC
  Administered 2023-12-07: 4 mg via INTRAVENOUS
  Filled 2023-12-06: qty 2

## 2023-12-06 MED ORDER — LOPERAMIDE HCL 2 MG PO CAPS
4.0000 mg | ORAL_CAPSULE | Freq: Once | ORAL | Status: AC
  Administered 2023-12-07: 4 mg via ORAL
  Filled 2023-12-06: qty 2

## 2023-12-06 NOTE — ED Provider Notes (Signed)
 [facility] Emergency Department Provider Note MRN:  [id number]  Arrival date & time: 12/07/23     Chief Complaint   Weakness and Diarrhea   History of Present Illness   Donald Gordon is a 83 y.o. year-old male presents to the ED with chief complaint of failure to thrive.  Daughter states patient's nephew is concerned that he hasn't been eating or drinking for the past 3 days.  A neighbor found him down on the ground in the house and called EMS.  Patient complains of back pain, neck pain, and arm pain.    States that he has been having several days of diarrhea.  Reports some associated nausea, but denies any vomiting.    Had recent fem-pop bypass at the end of last month.  History provided by daughter and patient.   Review of Systems  Pertinent positive and negative review of systems noted in HPI.    Physical Exam   Vitals:   12/06/23 2300 12/07/23 0254  BP: (!) 162/68   Pulse: 78   Resp: 20   Temp:  97.6 F (36.4 C)  SpO2: 100%     CONSTITUTIONAL:  non toxic, unkempt-appearing, NAD NEURO:  Alert and oriented x 3, CN 3-12 grossly intact EYES:  eyes equal and reactive ENT/NECK:  Supple, no stridor  CARDIO:  normal rate, regular rhythm, appears well-perfused, intact distal pulses PULM:  No respiratory distress, CTAB GI/GU:  non-distended, generalized abdominal tenderness MSK/SPINE:  No gross deformities, no edema, moves all extremities  SKIN:  no rash, well-healing surgical incisions to right groin and right calf   *Additional and/or pertinent findings included in MDM below  Diagnostic and Interventional Summary    EKG Interpretation Date/Time:  Sunday December 06 2023 22:42:44 EDT Ventricular Rate:  78 PR Interval:  164 QRS Duration:  134 QT Interval:  458 QTC Calculation: 522 R Axis:   270  Text Interpretation: Sinus rhythm RBBB and LAFB Confirmed by Kelsey Patricia [phone]) on 12/06/2023 10:48:33 PM       Labs Reviewed  CBC WITH  DIFFERENTIAL/PLATELET - Abnormal; Notable for the following components:      Result Value   WBC 18.5 (*)    RBC 3.92 (*)    Hemoglobin 12.1 (*)    HCT 37.0 (*)    Neutro Abs 14.8 (*)    Monocytes Absolute 2.4 (*)    All other components within normal limits  COMPREHENSIVE METABOLIC PANEL WITH GFR - Abnormal; Notable for the following components:   Potassium 2.7 (*)    Chloride 95 (*)    Glucose, Bld 153 (*)    Creatinine, Ser 1.49 (*)    Calcium  7.4 (*)    Albumin  3.0 (*)    Total Bilirubin 1.3 (*)    GFR, Estimated 46 (*)    Anion gap 16 (*)    All other components within normal limits  PROTIME-INR - Abnormal; Notable for the following components:   Prothrombin Time 20.9 (*)    INR 1.8 (*)    All other components within normal limits  TROPONIN I (HIGH SENSITIVITY) - Abnormal; Notable for the following components:   Troponin I (High Sensitivity) 40 (*)    All other components within normal limits  CULTURE, BLOOD (ROUTINE X 2)  CULTURE, BLOOD (ROUTINE X 2)  C DIFFICILE QUICK SCREEN W PCR REFLEX    GASTROINTESTINAL PANEL BY PCR, STOOL (REPLACES STOOL CULTURE)  MAGNESIUM   CK  BASIC METABOLIC PANEL WITH GFR  CBC  I-STAT CG4 LACTIC ACID, ED  TROPONIN I (HIGH SENSITIVITY)    CT HEAD WO CONTRAST ( )  Final Result    CT ABDOMEN PELVIS W CONTRAST  Final Result    DG Chest Port 1 View  Final Result    DG Shoulder Left  Final Result      Medications  potassium chloride  10 mEq in 100 mL IVPB (10 mEq Intravenous New Bag/Given 12/07/23 0253)  rosuvastatin  (CRESTOR ) tablet 10 mg (has no administration in time range)  memantine  (NAMENDA ) tablet 20 mg (has no administration in time range)  rivastigmine  (EXELON ) capsule 6 mg (has no administration in time range)  metoprolol  tartrate (LOPRESSOR ) tablet 50 mg (has no administration in time range)  pantoprazole  (PROTONIX ) EC tablet 40 mg (has no administration in time range)  finasteride  (PROSCAR ) tablet 5 mg (has no  administration in time range)  sodium chloride  flush (NS) 0.9 % injection 3 mL (has no administration in time range)  potassium chloride  (KLOR-CON ) packet 20 mEq (has no administration in time range)  acetaminophen  (TYLENOL ) tablet 650 mg (has no administration in time range)    Or  acetaminophen  (TYLENOL ) suppository 650 mg (has no administration in time range)  fentaNYL  (SUBLIMAZE ) injection 12.5-50 mcg (has no administration in time range)  sodium chloride  0.9 % bolus 1,000 mL (1,000 mLs Intravenous New Bag/Given 12/07/23 0049)  ondansetron  (ZOFRAN ) injection 4 mg (4 mg Intravenous Given 12/07/23 0051)  loperamide (IMODIUM) capsule 4 mg (4 mg Oral Given 12/07/23 0053)  fentaNYL  (SUBLIMAZE ) injection 50 mcg (50 mcg Intravenous Given 12/07/23 0049)  iohexol  (OMNIPAQUE ) 350 MG/ML injection 75 mL (75 mLs Intravenous Contrast Given 12/07/23 0118)     Procedures  /  Critical Care .Ultrasound ED Peripheral IV (Provider)  Date/Time: 12/07/2023 12:15 AM  Performed by: Sherel Dikes, PA-C Authorized by: Sherel Dikes, PA-C   Procedure details:    Indications: multiple failed IV attempts and poor IV access     Location:  Right AC   Angiocath:  18 G   Bedside Ultrasound Guided: Yes     Images: archived     Patient tolerated procedure without complications: Yes     Dressing applied: Yes   .Critical Care  Performed by: Sherel Dikes, PA-C Authorized by: Sherel Dikes, PA-C   Critical care provider statement:    Critical care time (minutes):  37   Critical care was necessary to treat or prevent imminent or life-threatening deterioration of the following conditions: hypokalemia to 2.7, questionable SAH.   Critical care was time spent personally by me on the following activities:  Development of treatment plan with patient or surrogate, discussions with consultants, evaluation of patient's response to treatment, examination of patient, ordering and review of laboratory studies, ordering  and review of radiographic studies, ordering and performing treatments and interventions, pulse oximetry, re-evaluation of patient's condition and review of old charts   ED Course and Medical Decision Making  I have reviewed the triage vital signs, the nursing notes, and pertinent available records from the EMR.  Social Determinants Affecting Complexity of Care: Patient has no clinically significant social determinants affecting this chief complaint..   ED Course: Clinical Course as of 12/07/23 0258  Mon Dec 07, 2023  0254 Comprehensive metabolic panel(!!) Potassium is noted to be 2.7, creatinine 1.49, this is about baseline.  Suspect that his potassium is low in part due to his diarrhea.  Will supplement with IV potassium here. [RB]  0254 CBC with Differential(!) Hemoglobin is 12.1, leukocytosis to 18.5,  will check CT abdomen/pelvis to look for evidence of infectious diarrhea or colitis or diverticulitis.  Will check GI pathogen panel. [RB]  0255 Troponin I (High Sensitivity) Troponin is slightly elevated at 40, repeat pending, patient denies any chest pain. [RB]  0255 I-Stat Lactic Acid, ED Normal lactic [RB]    Clinical Course User Index [RB] Sherel Dikes, PA-C    Medical Decision Making Patient here with multiple complaints.  Patient was found down at his home by a neighbor today.  EMS was called and patient was brought to the hospital.  Patient has reportedly been having frequent diarrhea for the past few days.  He has not been eating or drinking.  He was recently discharged after femoropopliteal bypass.  Patient had initially declined admission for rehab after his procedure and decided to go home.  It sounds like he has not been doing well at home.  Will check labs, CT, and reassess.  Patient will be admitted for repeat CT, continued repletion of potassium and rehydration.  He may also benefit from social work evaluation.  Amount and/or Complexity of Data Reviewed Labs:  ordered. Decision-making details documented in ED Course. Radiology: ordered.  Risk Prescription drug management. Decision regarding hospitalization.         Consultants: I consulted with Dr. Michale Age, who recommends repeat CT in 6 hours if anticoagulated or no repeat if not.  Available to consult if something changes, but signing off for now.  I consulted with Dr. Brice Campi, who is appreciated for admitting.   Treatment and Plan: Patient's exam and diagnostic results are concerning for hypokalemia, diarrhea, possible posttraumatic subarachnoid hemorrhage.  Feel that patient will need admission to the hospital for further treatment and evaluation.  Patient seen by and discussed with attending physician, Dr. Monique Ano, who agrees with plan.  Final Clinical Impressions(s) / ED Diagnoses     ICD-10-CM   1. SAH (subarachnoid hemorrhage) (HCC)  I60.9     2. Diarrhea, unspecified type  R19.7     3. Hypokalemia  E87.6     4. Fall, initial encounter  W19.XXXA     5. Failure to thrive in adult  R62.7       ED Discharge Orders     None         Discharge Instructions Discussed with and Provided to Patient:   Discharge Instructions   None      Sherel Dikes, PA-C 12/07/23 0258    Kelsey Patricia, MD 12/07/23 [phone]

## 2023-12-07 ENCOUNTER — Emergency Department (HOSPITAL_COMMUNITY)

## 2023-12-07 ENCOUNTER — Encounter (HOSPITAL_COMMUNITY): Payer: Self-pay | Admitting: Family Medicine

## 2023-12-07 ENCOUNTER — Observation Stay (HOSPITAL_COMMUNITY)

## 2023-12-07 DIAGNOSIS — R197 Diarrhea, unspecified: Secondary | ICD-10-CM | POA: Diagnosis not present

## 2023-12-07 DIAGNOSIS — E1122 Type 2 diabetes mellitus with diabetic chronic kidney disease: Secondary | ICD-10-CM

## 2023-12-07 DIAGNOSIS — I609 Nontraumatic subarachnoid hemorrhage, unspecified: Principal | ICD-10-CM

## 2023-12-07 DIAGNOSIS — F0390 Unspecified dementia without behavioral disturbance: Secondary | ICD-10-CM

## 2023-12-07 DIAGNOSIS — N1831 Chronic kidney disease, stage 3a: Secondary | ICD-10-CM

## 2023-12-07 DIAGNOSIS — I4891 Unspecified atrial fibrillation: Secondary | ICD-10-CM

## 2023-12-07 DIAGNOSIS — I739 Peripheral vascular disease, unspecified: Secondary | ICD-10-CM

## 2023-12-07 LAB — CBC WITH DIFFERENTIAL/PLATELET
Abs Immature Granulocytes: 0 10*3/uL (ref 0.00–0.07)
Basophils Absolute: 0 10*3/uL (ref 0.0–0.1)
Basophils Relative: 0 %
Eosinophils Absolute: 0 10*3/uL (ref 0.0–0.5)
Eosinophils Relative: 0 %
HCT: 37 % — ABNORMAL LOW (ref 39.0–52.0)
Hemoglobin: 12.1 g/dL — ABNORMAL LOW (ref 13.0–17.0)
Lymphocytes Relative: 7 %
Lymphs Abs: 1.3 10*3/uL (ref 0.7–4.0)
MCH: 30.9 pg (ref 26.0–34.0)
MCHC: 32.7 g/dL (ref 30.0–36.0)
MCV: 94.4 fL (ref 80.0–100.0)
Monocytes Absolute: 2.4 10*3/uL — ABNORMAL HIGH (ref 0.1–1.0)
Monocytes Relative: 13 %
Neutro Abs: 14.8 10*3/uL — ABNORMAL HIGH (ref 1.7–7.7)
Neutrophils Relative %: 80 %
Platelets: 289 10*3/uL (ref 150–400)
RBC: 3.92 MIL/uL — ABNORMAL LOW (ref 4.22–5.81)
RDW: 12.5 % (ref 11.5–15.5)
WBC: 18.5 10*3/uL — ABNORMAL HIGH (ref 4.0–10.5)
nRBC: 0 % (ref 0.0–0.2)

## 2023-12-07 LAB — BASIC METABOLIC PANEL WITH GFR
Anion gap: 11 (ref 5–15)
BUN: 18 mg/dL (ref 8–23)
CO2: 24 mmol/L (ref 22–32)
Calcium: 6.3 mg/dL — CL (ref 8.9–10.3)
Chloride: 99 mmol/L (ref 98–111)
Creatinine, Ser: 1.25 mg/dL — ABNORMAL HIGH (ref 0.61–1.24)
GFR, Estimated: 57 mL/min — ABNORMAL LOW (ref >60)
Glucose, Bld: 120 mg/dL — ABNORMAL HIGH (ref 70–99)
Potassium: 3.3 mmol/L — ABNORMAL LOW (ref 3.5–5.1)
Sodium: 134 mmol/L — ABNORMAL LOW (ref 135–145)

## 2023-12-07 LAB — TROPONIN I (HIGH SENSITIVITY)
Troponin I (High Sensitivity): 38 ng/L — ABNORMAL HIGH (ref <18)
Troponin I (High Sensitivity): 40 ng/L — ABNORMAL HIGH (ref <18)

## 2023-12-07 LAB — COMPREHENSIVE METABOLIC PANEL WITH GFR
ALT: 9 U/L (ref 0–44)
AST: 15 U/L (ref 15–41)
Albumin: 3 g/dL — ABNORMAL LOW (ref 3.5–5.0)
Alkaline Phosphatase: 71 U/L (ref 38–126)
Anion gap: 16 — ABNORMAL HIGH (ref 5–15)
BUN: 21 mg/dL (ref 8–23)
CO2: 26 mmol/L (ref 22–32)
Calcium: 7.4 mg/dL — ABNORMAL LOW (ref 8.9–10.3)
Chloride: 95 mmol/L — ABNORMAL LOW (ref 98–111)
Creatinine, Ser: 1.49 mg/dL — ABNORMAL HIGH (ref 0.61–1.24)
GFR, Estimated: 46 mL/min — ABNORMAL LOW (ref >60)
Glucose, Bld: 153 mg/dL — ABNORMAL HIGH (ref 70–99)
Potassium: 2.7 mmol/L — CL (ref 3.5–5.1)
Sodium: 137 mmol/L (ref 135–145)
Total Bilirubin: 1.3 mg/dL — ABNORMAL HIGH (ref 0.0–1.2)
Total Protein: 7.1 g/dL (ref 6.5–8.1)

## 2023-12-07 LAB — PROTIME-INR
INR: 1.8 — ABNORMAL HIGH (ref 0.8–1.2)
Prothrombin Time: 20.9 s — ABNORMAL HIGH (ref 11.4–15.2)

## 2023-12-07 LAB — CBC
HCT: 29.4 % — ABNORMAL LOW (ref 39.0–52.0)
Hemoglobin: 9.8 g/dL — ABNORMAL LOW (ref 13.0–17.0)
MCH: 31.3 pg (ref 26.0–34.0)
MCHC: 33.3 g/dL (ref 30.0–36.0)
MCV: 93.9 fL (ref 80.0–100.0)
Platelets: 258 10*3/uL (ref 150–400)
RBC: 3.13 MIL/uL — ABNORMAL LOW (ref 4.22–5.81)
RDW: 12.5 % (ref 11.5–15.5)
WBC: 15.6 10*3/uL — ABNORMAL HIGH (ref 4.0–10.5)
nRBC: 0 % (ref 0.0–0.2)

## 2023-12-07 LAB — MAGNESIUM: Magnesium: 1.8 mg/dL (ref 1.7–2.4)

## 2023-12-07 LAB — I-STAT CG4 LACTIC ACID, ED: Lactic Acid, Venous: 1.6 mmol/L (ref 0.5–1.9)

## 2023-12-07 LAB — CK: Total CK: 144 U/L (ref 49–397)

## 2023-12-07 MED ORDER — ACETAMINOPHEN 325 MG PO TABS
650.0000 mg | ORAL_TABLET | Freq: Four times a day (QID) | ORAL | Status: DC | PRN
  Administered 2023-12-07 – 2023-12-10 (×8): 650 mg via ORAL
  Filled 2023-12-07 (×8): qty 2

## 2023-12-07 MED ORDER — POTASSIUM CHLORIDE 20 MEQ PO PACK
20.0000 meq | PACK | Freq: Once | ORAL | Status: AC
  Administered 2023-12-07: 20 meq via ORAL
  Filled 2023-12-07: qty 1

## 2023-12-07 MED ORDER — POTASSIUM CHLORIDE 10 MEQ/100ML IV SOLN
10.0000 meq | INTRAVENOUS | Status: AC
  Administered 2023-12-07 (×4): 10 meq via INTRAVENOUS
  Filled 2023-12-07 (×4): qty 100

## 2023-12-07 MED ORDER — CLOPIDOGREL BISULFATE 75 MG PO TABS
75.0000 mg | ORAL_TABLET | Freq: Every day | ORAL | Status: DC
  Administered 2023-12-07 – 2023-12-10 (×4): 75 mg via ORAL
  Filled 2023-12-07 (×4): qty 1

## 2023-12-07 MED ORDER — IOHEXOL 350 MG/ML SOLN
75.0000 mL | Freq: Once | INTRAVENOUS | Status: AC | PRN
  Administered 2023-12-07: 75 mL via INTRAVENOUS

## 2023-12-07 MED ORDER — ACETAMINOPHEN 650 MG RE SUPP: 650.0000 mg | Freq: Four times a day (QID) | RECTAL | Status: DC | PRN

## 2023-12-07 MED ORDER — FENTANYL CITRATE PF 50 MCG/ML IJ SOSY: 12.5000 ug | PREFILLED_SYRINGE | INTRAMUSCULAR | Status: DC | PRN

## 2023-12-07 MED ORDER — SODIUM CHLORIDE 0.9% FLUSH
3.0000 mL | Freq: Two times a day (BID) | INTRAVENOUS | Status: DC
  Administered 2023-12-07 – 2023-12-10 (×8): 3 mL via INTRAVENOUS

## 2023-12-07 MED ORDER — ROSUVASTATIN CALCIUM 5 MG PO TABS
10.0000 mg | ORAL_TABLET | Freq: Every day | ORAL | Status: DC
  Administered 2023-12-07 – 2023-12-10 (×4): 10 mg via ORAL
  Filled 2023-12-07 (×4): qty 2

## 2023-12-07 MED ORDER — FINASTERIDE 5 MG PO TABS
5.0000 mg | ORAL_TABLET | Freq: Every day | ORAL | Status: DC
  Administered 2023-12-07 – 2023-12-10 (×4): 5 mg via ORAL
  Filled 2023-12-07 (×4): qty 1

## 2023-12-07 MED ORDER — METOPROLOL TARTRATE 50 MG PO TABS
50.0000 mg | ORAL_TABLET | Freq: Two times a day (BID) | ORAL | Status: DC
  Administered 2023-12-07 – 2023-12-10 (×8): 50 mg via ORAL
  Filled 2023-12-07 (×7): qty 1
  Filled 2023-12-07: qty 2

## 2023-12-07 MED ORDER — APIXABAN 5 MG PO TABS
5.0000 mg | ORAL_TABLET | Freq: Two times a day (BID) | ORAL | Status: DC
  Administered 2023-12-07 – 2023-12-10 (×7): 5 mg via ORAL
  Filled 2023-12-07 (×7): qty 1

## 2023-12-07 MED ORDER — SODIUM CHLORIDE 0.9 % IV SOLN: INTRAVENOUS | Status: DC

## 2023-12-07 MED ORDER — RIVASTIGMINE TARTRATE 1.5 MG PO CAPS
6.0000 mg | ORAL_CAPSULE | Freq: Two times a day (BID) | ORAL | Status: DC
  Administered 2023-12-07 – 2023-12-10 (×7): 6 mg via ORAL
  Filled 2023-12-07 (×8): qty 4

## 2023-12-07 MED ORDER — MEMANTINE HCL 10 MG PO TABS
20.0000 mg | ORAL_TABLET | Freq: Every day | ORAL | Status: DC
  Administered 2023-12-07 – 2023-12-09 (×3): 20 mg via ORAL
  Filled 2023-12-07 (×3): qty 2

## 2023-12-07 MED ORDER — PANTOPRAZOLE SODIUM 40 MG PO TBEC
40.0000 mg | DELAYED_RELEASE_TABLET | Freq: Every day | ORAL | Status: DC
  Administered 2023-12-07 – 2023-12-10 (×4): 40 mg via ORAL
  Filled 2023-12-07 (×4): qty 1

## 2023-12-07 MED ORDER — FERROUS SULFATE 325 (65 FE) MG PO TABS
325.0000 mg | ORAL_TABLET | ORAL | Status: DC
  Administered 2023-12-07 – 2023-12-09 (×2): 325 mg via ORAL
  Filled 2023-12-07 (×2): qty 1

## 2023-12-07 MED ORDER — EZETIMIBE 10 MG PO TABS
10.0000 mg | ORAL_TABLET | Freq: Every evening | ORAL | Status: DC
  Administered 2023-12-07 – 2023-12-09 (×3): 10 mg via ORAL
  Filled 2023-12-07 (×3): qty 1

## 2023-12-07 MED ORDER — CALCIUM GLUCONATE-NACL 1-0.675 GM/50ML-% IV SOLN
1.0000 g | Freq: Once | INTRAVENOUS | Status: AC
  Administered 2023-12-07: 1000 mg via INTRAVENOUS
  Filled 2023-12-07: qty 50

## 2023-12-07 NOTE — Progress Notes (Signed)
 PROGRESS NOTE  Donald Gordon  DOB: 29-Aug-1940  PCP: [facility] NFA:[id number]  DOA: 12/06/2023  LOS: 0 days  Hospital Day: 2  Brief narrative: Donald Gordon is a 83 y.o. male with PMH significant for DM2, HTN, HLD, OSA, CAD/MI/stent, arrhythmia, PAD s/p stent/bypass, paroxysmal A-fib on Eliquis , stroke, GERD, COPD, arthritis. 6/15, patient presented to the ED with complaint of diarrhea, loss of appetite, generalized weakness and a fall.    5/30, patient had right lower extremity femoropopliteal bypass and stenting done.  He was admitted under vascular surgery at that time.  PT eval was obtained and recommended SNF but patient chose to go home. Patient lives alone, daughter lives in different town.  Patient states he tries his best to stay active and is starting to be mobile again. 6/12, patient developed diarrhea which persisted.  He lost his appetite, got dehydrated.  Got progressively weak.  The night before presentation, patient felt lightheaded, fell to the floor and sustained a wound on his left elbow.  Next day, his daughter happened to check with the neighbors who stated they have not seen him out for few days.  EMS was called and patient was brought to the ED.  Patient was afebrile, blood pressure in 150s, breathing on room air Labs showed WBC count 15.5, hemoglobin 12.1, lactic acid level normal, potassium low at 2.7, BUN/creatinine 21/1.49, albumin  low at 3, troponin elevated to 40 Blood culture collected.  Chest x-ray did not show any acute chest disease  CT abdomen pelvis showed left colonic diverticulosis without evidence of diverticulitis. CT head showed  -1.2 cm linear hyperdensity involving the high left frontal lobe that reflects a small amount of acute posttraumatic SAH, no significant surrounding edema.   -Chronic left PCA distribution infarct, with additional small remote left cerebellar infarct. -Underlying atrophy with moderate chronic microvascular ischemic  disease.  Neurosurgery (Dr. Michale Age) was consulted by the ED PA.  Recommended to repeat CT head in 6 hours if no change, no intervention indicated.   Patient was given a liter of NS, IV potassium, fentanyl , Zofran , and Imodium.  Admitted to [facility] Repeat CT head this morning 6/16 showed that the curvilinear radiopacity noted within the left anterior frontal lobe appears to clearly be within the cortex and is compatible with laminar necrosis. There is no convincing evidence of intracranial hemorrhage.  Subjective: Patient was seen and examined this morning. Pleasant elderly Caucasian male.  Propped up in bed.  Not in distress.  His daughter Ms. Donald Gordon was at bedside. Patient had 1 loose bowel movement this morning.  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 Since [facility] has been ruled out, I will resume Eliquis  and Plavix  given recent vascular intervention.   Diarrhea  Started on 6/12 and persisted leading to dehydration weakness and fall  No acute findings on CT abdomen/pelvis marked leukocytosis noted    Check C diff and GI pathogen panel, use enteric precautions for now  WBC count downtrending without antibiotics will continue to monitor. NS at 75 mL/h for next 24 hours.  Continue monitor diarrhea frequency and severity Recent Labs  Lab 12/06/23 2349 12/06/23 2358 12/07/23 0500  WBC 18.5*  --  15.6*  LATICACIDVEN  --  1.6  --      Hypokalemia  K 3.3 this am. Kcl 40 mEq ordered. Recent Labs  Lab 12/06/23 2349  12/07/23 0500  K 2.7* 3.3*  MG 1.8  --    CKD 3A Creatinine baseline Recent Labs    10/28/23 0939 10/30/23 0819 11/11/23 0930 11/20/23 0707 11/20/23 1513 11/21/23 0026 12/06/23 2349 12/07/23 0500  BUN 19 22 15 21   --  18 21 18   CREATININE 1.40* 1.40* 1.37* 1.30* 1.29* 1.41* 1.49* 1.25*   Acute anemia Hemoglobin  dropped to 9.8.  No active bleeding.   Watch out on Plavix  and Eliquis . Recent Labs    11/20/23 1513 11/21/23 0026 11/23/23 0352 12/06/23 2349 12/07/23 0500  HGB 12.1* 10.3* 10.6* 12.1* 9.8*  MCV 96.1 95.4 95.9 94.4 93.9   HTN PTA meds-metoprolol  tartrate 50 mg twice daily, HCTZ 12.5 mg daily, ramipril  10 mg twice daily Continue metoprolol .   Given dehydration from diarrhea, keep HCTZ and ramipril  on hold IV hydralazine  as needed  PAF  Continue metoprolol  Eliquis  resumed   CAD/MI s/p stent  PAD s/p stent/bypass HLD No acute ischemia  Eliquis  and Plavix  resumed Continue Crestor   Dementia  Continue Namenda  and Exelon  delirium precautions    BPH finasteride   GERD PPI  Arthritis Left shoulder pain Left shoulder x-ray showed -Osteopenia and degenerative change without evidence of left shoulder fractures or dislocation. -Labral chondrocalcinosis. -Trace calcification at the humeral cuff insertion which may indicate calcific tendinitis or tendinopathy. Pain regimen with Tylenol  as needed   Mobility: PT eval ordered  Goals of care   Code Status: Full Code     DVT prophylaxis:  SCDs Start: 12/07/23 0253 apixaban  (ELIQUIS ) tablet 5 mg   Antimicrobials: None Fluid: NS at 75 mL/h for next 24 hours Consultants: Neurosurgery on the phone by EDP Family Communication: Daughter at bedside  Status: Observation Level of care:  Telemetry Medical   Patient is from: Home Needs to continue in-hospital care: Needs to monitor for diarrhea severity, may need IV hydration Anticipated d/c to: Pending clinical course, pending PT eval    Diet: Started cardiac diet Diet Order             Diet Heart Fluid consistency: Thin  Diet effective now                   Scheduled Meds:  apixaban   5 mg Oral BID   clopidogrel   75 mg Oral Daily   ezetimibe   10 mg Oral QPM   ferrous sulfate   325 mg Oral Q48H   finasteride   5 mg Oral Daily   memantine   20 mg Oral QHS    metoprolol  tartrate  50 mg Oral BID   pantoprazole   40 mg Oral QAC breakfast   rivastigmine   6 mg Oral BID   rosuvastatin   10 mg Oral Daily   sodium chloride  flush  3 mL Intravenous Q12H    PRN meds: acetaminophen  **OR** acetaminophen    Infusions:   sodium chloride       Antimicrobials: Anti-infectives (From admission, onward)    None       Objective: Vitals:   12/07/23 0700 12/07/23 0817  BP: (!) 151/72 (!) 165/76  Pulse: 72 74  Resp: 20 17  Temp:  97.7 F (36.5 C)  SpO2: 96% 98%    Intake/Output Summary (Last 24 hours) at 12/07/2023 1201 Last data filed at 12/07/2023 0657 Gross per 24 hour  Intake 898.96 ml  Output --  Net 898.96 ml   Filed Weights   12/07/23 0905  Weight: 75.2 kg   Weight change:  Body mass index is 24.48 kg/m.   Physical  Exam: General exam: Pleasant, elderly Caucasian male.  Not in distress Skin: No rashes, lesions or ulcers. HEENT: Atraumatic, normocephalic, no obvious bleeding Lungs: Clear to auscultation bilaterally,  CVS: S1, S2, no murmur,   GI/Abd: Soft, nontender, nondistended, bowel sound present,   CNS: Alert, awake, slow to respond but oriented x 3. Psychiatry: Mood appropriate,  Extremities: No pedal edema, no calf tenderness, mild abrasion on the left elbow.  Data Review: I have personally reviewed the laboratory data and studies available.  F/u labs ordered Unresulted Labs (From admission, onward)     Start     Ordered   12/07/23 0500  Basic metabolic panel  Daily,   R      12/07/23 0253   12/07/23 0500  CBC  Daily,   R      12/07/23 0253   12/07/23 0056  C Difficile Quick Screen w PCR reflex  (C Difficile quick screen w PCR reflex panel )  Once, for 24 hours,   URGENT       References:    CDiff Information Tool   12/07/23 0055   12/07/23 0056  Gastrointestinal Panel by PCR , Stool  (Gastrointestinal Panel by PCR, Stool                                                                                                                                                      **Does Not include CLOSTRIDIUM DIFFICILE testing. **If CDIFF testing is needed, place order from the C Difficile Testing order set.**)  Once,   URGENT        12/07/23 0055   12/06/23 2313  Blood Culture (routine x 2)  (Undifferentiated presentation (screening labs and basic nursing orders))  BLOOD CULTURE X 2,   STAT      12/06/23 2313            Signed, Hoyt Macleod, MD [facility] 12/07/2023

## 2023-12-07 NOTE — Evaluation (Signed)
 Physical Therapy Evaluation Patient Details Name: Donald Gordon MRN: [id number] DOB: 20-Jul-1940 Today's Date: 12/07/2023  History of Present Illness  83 yo male presents to [facility] 6/15 with diarrhea, poor appetite, weakness, fall. CT abdomen pelvis showed left colonic diverticulosis without evidence of diverticulitis. 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 Repeat CT 6/16 shows lesion area is more compatible with laminar necrosis and NOT SAH. Recent admission with RLE fempop bypass 5/30, PT recommending SNF at that time and pt went home instead. PMH includes dementia, DM2, HTN, HLD, OSA, CAD/MI/stent, arrhythmia, PAD s/p stent/bypass, paroxysmal A-fib on Eliquis , stroke, GERD, COPD, arthritis.  Clinical Impression   Pt presents with LE weakness, impaired balance with recent history of falls, decreased activity tolerance vs baseline. Pt to benefit from acute PT to address deficits. Pt ambulated hallway distance with use of RW, requires CGA for safety and mod cuing for form throughout. Pt states after last discharge, pt has struggled at home without support, but pt states he does not want to d/c to SNF and does not want to ask family for help. Would recommend [facility] services if pt not amenable to SNF level of care post-acutely. PT to progress mobility as tolerated, and will continue to follow acutely.          If plan is discharge home, recommend the following: A little help with bathing/dressing/bathroom;Assistance with cooking/housework;Assist for transportation;Help with stairs or ramp for entrance   Can travel by private vehicle        Equipment Recommendations None recommended by PT  Recommendations for Other Services       Functional Status Assessment Patient has had a recent decline in their functional status and demonstrates the ability to make significant improvements in function in a reasonable and predictable amount of time.     Precautions /  Restrictions Precautions Precautions: Fall Precaution/Restrictions Comments: R groin and R lower leg medial incision, s/p fempop bypass 5/30 Restrictions Weight Bearing Restrictions Per Provider Order: No      Mobility  Bed Mobility Overal bed mobility: Needs Assistance Bed Mobility: Sit to Supine       Sit to supine: Supervision   General bed mobility comments: sitting up in recliner upon arrival to room, increased time and x2 trials needed to get into bed by himself    Transfers Overall transfer level: Needs assistance Equipment used: Rolling walker (2 wheels) Transfers: Sit to/from Stand Sit to Stand: Contact guard assist           General transfer comment: for safety, slow to rise and sit. stand x2, from recliner and toilet    Ambulation/Gait Ambulation/Gait assistance: Contact guard assist Gait Distance (Feet): 90 Feet Assistive device: Rolling walker (2 wheels) Gait Pattern/deviations: Trunk flexed, Wide base of support, Step-through pattern, Decreased stride length Gait velocity: decr     General Gait Details: close guard for safety, cues for upright posture and placement in RW. Pt with heavy anterior leaning and propping on elbows at about halfway point in gait, pt stating his back hurts and declines correction of form  Stairs            Wheelchair Mobility     Tilt Bed    Modified Rankin (Stroke Patients Only)       Balance Overall balance assessment: Needs assistance Sitting-balance support: Feet supported Sitting balance-Leahy Scale: Good     Standing balance support: During functional activity, Bilateral upper extremity supported, Reliant on assistive  device for balance Standing balance-Leahy Scale: Poor Standing balance comment: relies on RW for stability but no external support needed                             Pertinent Vitals/Pain Pain Assessment Pain Assessment: Faces Faces Pain Scale: Hurts little more Pain  Location: RLE, back Pain Descriptors / Indicators: Discomfort Pain Intervention(s): Limited activity within patient's tolerance, Monitored during session, Repositioned    Home Living Family/patient expects to be discharged to:: Private residence Living Arrangements: Alone Available Help at Discharge: Family;Available PRN/intermittently;Friend(s) (daughter) Type of Home: House Home Access: Stairs to enter Entrance Stairs-Rails: Can reach both Entrance Stairs-Number of Steps: 3   Home Layout: One level Home Equipment: Rollator (4 wheels);Cane - single point;Wheelchair - manual;BSC/3in1      Prior Function Prior Level of Function : Independent/Modified Independent;Driving             Mobility Comments: uses RW for mobility, states he has been struggling at home since recent d/c but was not requiring physical assist from anyone. Pt states it would sometimes take me 3-4 hours to get out of bed ADLs Comments: pt endorses indep     Extremity/Trunk Assessment   Upper Extremity Assessment Upper Extremity Assessment: Defer to OT evaluation    Lower Extremity Assessment Lower Extremity Assessment: Generalized weakness (3+/5 knee extension bilat, 4/5 hip flexion LLE (unable on RLE given groin pain from incision), 4/5 hip abd/add bilat) RLE: Unable to fully assess due to pain    Cervical / Trunk Assessment Cervical / Trunk Assessment: Kyphotic  Communication   Communication Communication: No apparent difficulties    Cognition Arousal: Alert Behavior During Therapy: WFL for tasks assessed/performed   PT - Cognitive impairments: History of cognitive impairments                       PT - Cognition Comments: history of dementia, lacks insight into present deficits and how it will translate into home setting. Following commands: Intact       Cueing Cueing Techniques: Verbal cues     General Comments General comments (skin integrity, edema, etc.): vss     Exercises     Assessment/Plan    PT Assessment Patient needs continued PT services  PT Problem List Decreased activity tolerance;Decreased balance;Decreased mobility;Decreased range of motion;Decreased knowledge of use of DME;Decreased safety awareness;Decreased knowledge of precautions;Pain       PT Treatment Interventions DME instruction;Gait training;Functional mobility training;Therapeutic activities;Therapeutic exercise;Balance training;Patient/family education;Stair training    PT Goals (Current goals can be found in the Care Plan section)  Acute Rehab PT Goals Patient Stated Goal: get stronger, be independent PT Goal Formulation: With patient Time For Goal Achievement: 12/21/23 Potential to Achieve Goals: Fair    Frequency Min 2X/week     Co-evaluation               AM-PAC PT 6 Clicks Mobility  Outcome Measure Help needed turning from your back to your side while in a flat bed without using bedrails?: A Little Help needed moving from lying on your back to sitting on the side of a flat bed without using bedrails?: A Little Help needed moving to and from a bed to a chair (including a wheelchair)?: A Little Help needed standing up from a chair using your arms (e.g., wheelchair or bedside chair)?: A Little Help needed to walk in hospital room?: A Little Help needed  climbing 3-5 steps with a railing? : A Little 6 Click Score: 18    End of Session Equipment Utilized During Treatment: Gait belt Activity Tolerance: Patient tolerated treatment well Patient left: with call bell/phone within reach;in bed;with bed alarm set Nurse Communication: Mobility status PT Visit Diagnosis: Muscle weakness (generalized) (M62.81);Pain Pain - Right/Left: Right Pain - part of body: Leg    Time: 1315-1340 PT Time Calculation (min) (ACUTE ONLY): 25 min   Charges:   PT Evaluation $PT Eval Low Complexity: 1 Low   PT General Charges $$ ACUTE PT VISIT: 1 Visit         Shirlene Doughty, PT DPT Acute Rehabilitation Services Secure Chat Preferred  Office [phone]   Lily Kernen E Burnadette Carrion 12/07/2023, 4:14 PM

## 2023-12-07 NOTE — ED Triage Notes (Signed)
 Pt bib [facility] coming from home. EMS states they were called out by patient's neighbor, who found the patient on the ground at home. EMS believes patient had an unwitnessed fall. Pt states he was on the ground for about 5 minutes. Notable skin tear to left elbow. Pt reports having diarrhea x2 days. Pt has no other complaints at this time. GCS 15.   EMS VS: 154/64 166 cbg 84 HR 97% RA

## 2023-12-07 NOTE — H&P (Signed)
 History and Physical    Donald Gordon:[id number] DOB: 1940/09/10 DOA: 12/06/2023  PCP: [facility]   Patient coming from: Home   Chief Complaint: Diarrhea, loss of appetite, fall   HPI: Donald Gordon is a 83 y.o. male with medical history significant for hypertension, CAD, PAF on Eliquis , CKD 3A, dementia, and PAD status post right femoral to popliteal bypass on 11/20/2023 who now presents with diarrhea, loss of appetite, generalized weakness, and a fall.  Patient reports feeling generally weak after 3-4 days of nausea, diarrhea, and loss of appetite. He fell last night, does not believe that he hit his head, but suffered a wound on his left elbow.  He denies abdominal pain, headache, or focal numbness or weakness.  He does not believe that he has had a fever.  ED Course: Upon arrival to the ED, patient is found to be afebrile and saturating well on room air with normal HR and stable BP.  Labs are most notable for potassium 2.7, creatinine 1.49, WBC 18,500, normal lactic acid, and normal CK.  Head CT demonstrates 1.2 cm linear hyperdensity involving the high left frontal lobe which is suspected to represent a small subarachnoid hemorrhage.  Neurosurgery (Dr. Michale Age) was consulted by the ED PA and indicated that there was nothing more to do with repeat head CT is unchanged in 6 hours.  Patient was given a liter of NS, IV potassium, fentanyl , Zofran , and Imodium.  Review of Systems:  All other systems reviewed and apart from HPI, are negative.  Past Medical History:  Diagnosis Date   Arthritis    knees, elbows (03/18/2018)   Chronic edema    a. Chronic RLE edema.   COPD (chronic obstructive pulmonary disease) (HCC)    Coronary artery disease    a. MI s/p balloon 1996, details unclear.   Dysrhythmia    PROXIMAL MARGIN FIBRILATION   GERD (gastroesophageal reflux disease)    Hyperlipidemia    Hypertension    Myocardial infarction (HCC) 1996   PAD (peripheral artery  disease) (HCC)    a. s/p stenting 08/2012, 02/2013.    PAF (paroxysmal atrial fibrillation) (HCC)    Sleep apnea    pt denies ever having sleep study   Stroke [facility]) ~ 2014   denies residual on 03/18/2018   Thrombosis of lower extremity    a. Listed on patient's medical bracelet - at [place].   Type II diabetes mellitus (HCC)     Past Surgical History:  Procedure Laterality Date   ABDOMINAL AORTOGRAM N/A 10/30/2023   Procedure: ABDOMINAL AORTOGRAM;  Surgeon: [name], MD;  [facility] INVASIVE CV LAB;  Service: Cardiovascular;  Laterality: N/A;   ABDOMINAL AORTOGRAM W/LOWER EXTREMITY N/A 07/25/2019   Procedure: ABDOMINAL AORTOGRAM W/LOWER EXTREMITY;  Surgeon: [name], MD;  Location: [facility] INVASIVE CV LAB;  Service: Cardiovascular;  Laterality: N/A;   ANGIOPLASTY Right 06/03/2020   Procedure: BALLOON ANGIOPLASTY POPITEAL ARTERY;  Surgeon: [name], MD;  Location: [facility] OR;  Service: Vascular;  Laterality: Right;   ANGIOPLASTY ILLIAC ARTERY Right 11/20/2023   Procedure: DRUG COATED BALLOON ANGIOPLASTY, RIGHT ILIAC ARTERY;  Surgeon: [name], MD;  Location: [facility] OR;  Service: Vascular;  Laterality: Right;   ANTERIOR LUMBAR FUSION  1981   APPENDECTOMY     APPLICATION OF WOUND VAC Right 01/09/2018   Procedure: APPLICATION OF WOUND VAC ON RIGHT LOWER LEG;  Surgeon: [name], MD;  Location: [facility] OR;  Service: Vascular;  Laterality:  Right;   ATRIAL FIBRILLATION ABLATION N/A 12/07/2020   Procedure: ATRIAL FIBRILLATION ABLATION;  Surgeon: [name], MD;  [facility]: [facility] LAB;  Service: Cardiovascular;  Laterality: N/A;   BACK SURGERY     BUBBLE STUDY  12/07/2020   Procedure: BUBBLE STUDY;  Surgeon: [name], MD;  Location: [facility] ENDOSCOPY;  Service: Cardiovascular;;   CATARACT EXTRACTION W/ INTRAOCULAR LENS  IMPLANT, BILATERAL Bilateral    CORONARY ANGIOPLASTY WITH STENT PLACEMENT     I've got 1 stent in there (03/18/2018)   FASCIOTOMY Right  01/09/2018   Procedure: FASCIOTOMY RIGHT LOWER LEG;  Surgeon: [name], MD;  Location: [facility] OR;  Service: Vascular;  Laterality: Right;   FASCIOTOMY Right 06/04/2020   Procedure: RIGHT LOWER EXTREMITY FASCIOTOMY;  Surgeon: Richrd Char, MD;  Location: [facility] OR;  Service: Vascular;  Laterality: Right;   FASCIOTOMY CLOSURE Right 01/11/2018   Procedure: FASCIOTOMY CLOSURE RIGHT CALF;  Surgeon: [name], MD;  Location: [facility] OR;  Service: Vascular;  Laterality: Right;   FEMORAL-POPLITEAL BYPASS GRAFT Right 01/09/2018   Procedure: RIGHT FEMORAL-POPLITEAL ARTERY BYPASS USING PROPATEN X 80CM VASCULAR GRAFT;  Surgeon: [name], MD;  Location: [facility] OR;  Service: Vascular;  Laterality: Right;   FEMORAL-POPLITEAL BYPASS GRAFT Right 11/20/2023   Procedure: BYPASS GRAFT RIGHT COMMON FEMORAL- BELOW KNEE POPLITEAL ARTERY USING X 80CM PROPATEN GRAFT WITH REDO COMMON FEMORAL AND POSTERIOR TIBIAL EXPOSURE;  Surgeon: [name], MD;  Location: [facility] OR;  Service: Vascular;  Laterality: Right;   HEMORRHOID SURGERY     INTRAOPERATIVE ARTERIOGRAM Right 11/20/2023   Procedure: [employer];  Surgeon: [name], MD;  Location: [facility] OR;  Service: Vascular;  Laterality: Right;   LEFT HEART CATH AND CORONARY ANGIOGRAPHY N/A 03/19/2018   Procedure: LEFT HEART CATH AND CORONARY ANGIOGRAPHY;  Surgeon: Swazil[place], Peter M, MD;  [facility];  Service: Cardiovascular;  Laterality: N/A;   LOWER EXTREMITY ANGIOGRAM Right 06/02/2020   Procedure: RIGHT LOWER EXTREMITY ANGIOGRAM INTERVENTION;  Surgeon: [name], MD;  Location: [facility] OR;  Service: Vascular;  Laterality: Right;   LOWER EXTREMITY ANGIOGRAM Right 06/03/2020   Procedure: RIGHT LOWER EXTREMITY ANGIOGRAM from FEMORAL APPROACH;  Surgeon: [name], MD;  Location: [facility] OR;  Service: Vascular;  Laterality: Right;   LOWER EXTREMITY ANGIOGRAPHY N/A 08/31/2019   Procedure: LOWER EXTREMITY ANGIOGRAPHY;  Surgeon: Young Hensen, MD;   [facility];  Service: Cardiovascular;  Laterality: N/A;   LOWER EXTREMITY ANGIOGRAPHY Right 01/18/2020   Procedure: LOWER EXTREMITY ANGIOGRAPHY;  Surgeon: Young Hensen, MD;  [facility];  Service: Cardiovascular;  Laterality: Right;   LOWER EXTREMITY ANGIOGRAPHY N/A 09/11/2022   Procedure: Lower Extremity Angiography;  Surgeon: [name], MD;  Location: [facility] INVASIVE CV LAB;  Service: Cardiovascular;  Laterality: N/A;  With thrombolysis catheter insertion   LOWER EXTREMITY ANGIOGRAPHY Right 10/30/2023   Procedure: Lower Extremity Angiography;  Surgeon: [name], MD;  Location: [facility] INVASIVE CV LAB;  Service: Cardiovascular;  Laterality: Right;   PATCH ANGIOPLASTY Right 01/09/2018   Procedure: PATCH ANGIOPLASTY USING Corinna Dickens BIOLOGIC 1CM X 6CM PATCH;  Surgeon: [name], MD;  Location: [facility] OR;  Service: Vascular;  Laterality: Right;   PERIPHERAL VASCULAR ATHERECTOMY Right 01/19/2020   Procedure: PERIPHERAL VASCULAR ATHERECTOMY;  Surgeon: Young Hensen, MD;  [facility];  Service: Cardiovascular;  Laterality: Right;  Femoral popliteal and tibioperoneal arteries.  PERIPHERAL VASCULAR BALLOON ANGIOPLASTY Right 09/01/2019   Procedure: PERIPHERAL VASCULAR BALLOON ANGIOPLASTY;  Surgeon: [name], MD;  Location: [facility] INVASIVE CV LAB;  Service: Cardiovascular;  Laterality: Right;  peroneal artery.   PERIPHERAL VASCULAR INTERVENTION Right 07/25/2019   Procedure: PERIPHERAL VASCULAR INTERVENTION;  Surgeon: [name], MD;  Location: [facility] INVASIVE CV LAB;  Service: Cardiovascular;  Laterality: Right;  SFA X 3   PERIPHERAL VASCULAR INTERVENTION Right 09/01/2019   Procedure: PERIPHERAL VASCULAR INTERVENTION;  Surgeon: [name], MD;  Location: [facility] INVASIVE CV LAB;  Service: Cardiovascular;  Laterality: Right;  superficial femoral   PERIPHERAL VASCULAR THROMBECTOMY Right 09/01/2019   Procedure:  PERIPHERAL VASCULAR THROMBECTOMY;  Surgeon: [name], MD;  Location: [facility] INVASIVE CV LAB;  Service: Cardiovascular;  Laterality: Right;   PERIPHERAL VASCULAR THROMBECTOMY Right 01/18/2020   Procedure: PERIPHERAL VASCULAR THROMBECTOMY;  Surgeon: Young Hensen, MD;  [facility];  Service: Cardiovascular;  Laterality: Right;   PERIPHERAL VASCULAR THROMBECTOMY N/A 01/19/2020   Procedure: LYSIS RECHECK;  Surgeon: Young Hensen, MD;  [facility];  Service: Cardiovascular;  Laterality: N/A;   PERIPHERAL VASCULAR THROMBECTOMY N/A 09/12/2022   Procedure: LYSIS RECHECK;  Surgeon: [name], MD;  [facility];  Service: Cardiovascular;  Laterality: N/A;   POSTERIOR LUMBAR FUSION  1978   TEE WITHOUT CARDIOVERSION N/A 12/07/2020   Procedure: TRANSESOPHAGEAL ECHOCARDIOGRAM (TEE);  Surgeon: [name], MD;  Location: [facility] ENDOSCOPY;  Service: Cardiovascular;  Laterality: N/A;   THROMBECTOMY FEMORAL ARTERY Right 01/09/2018   Procedure: THROMBECTOMY RIGHT LOWER LEG;  Surgeon: [name], MD;  Location: [facility] OR;  Service: Vascular;  Laterality: Right;   TONSILLECTOMY      Social History:   reports that he quit smoking about 29 years ago. His smoking use included cigarettes. He started smoking about 59 years ago. He has a 90 pack-year smoking history. He has never used smokeless tobacco. He reports that he does not drink alcohol and does not use drugs.  Allergies  Allergen Reactions   Oxycodone  Nausea And Vomiting   Statins Other (See Comments)    Muscle and Bone pain    Amiodarone      Caused issues and was stopped by MD- possible was the cause for the patient's heart going in and out of rhythm multiple times   Donepezil Other (See Comments)    Per [facility] Insomnia   Imipramine Other (See Comments)    Per [facility]   Oxybutynin Chloride Swelling and Other (See Comments)    Per [facility]    Family History  Problem Relation  Age of Onset   CAD Mother    CAD Brother    Hypertension Other      Prior to Admission medications   Medication Sig Start Date End Date Taking? Authorizing Provider  acetaminophen  (TYLENOL ) 500 MG tablet Take 500-1,000 mg by mouth 4 (four) times daily as needed for moderate pain (pain score 4-6) or headache.    [provider]  albuterol  (VENTOLIN  HFA) 108 (90 Base) MCG/ACT inhaler Inhale 1 puff into the lungs every 6 (six) hours as needed for shortness of breath. 12/05/20   [provider]  apixaban  (ELIQUIS ) 5 MG TABS tablet Take 1 tablet (5 mg total) by mouth 2 (two) times daily. 09/13/22   Baglia, Corrina, PA-C  Cholecalciferol  (VITAMIN D ) 50 MCG (2000 UT) tablet Take 2,000 Units by mouth every other day.    [provider]  clopidogrel  (PLAVIX ) 75 MG tablet Take 1 tablet by mouth daily. 12/17/20   [provider]  ezetimibe  (ZETIA ) 10 MG tablet Take 10 mg by mouth every evening.     [provider]  ferrous sulfate  325 (65 FE) MG tablet Take 325 mg by mouth every other day.    [provider]  finasteride  (PROSCAR ) 5 MG tablet Take 5 mg by mouth daily.    [provider]  hydrochlorothiazide  (MICROZIDE ) 12.5 MG capsule Take 12.5 mg by mouth daily.    [provider]  memantine  (NAMENDA ) 10 MG tablet Take 20 mg by mouth at bedtime.    [provider]  metoprolol  tartrate (LOPRESSOR ) 50 MG tablet Take 50 mg by mouth 2 (two) times daily.    [provider]  nitroGLYCERIN  (NITROSTAT ) 0.4 MG SL tablet Place 0.4 mg under the tongue every 5 (five) minutes x 3 doses as needed for chest pain.    [provider]  pantoprazole  (PROTONIX ) 40 MG tablet Take 40 mg by mouth daily before breakfast.    [provider]  ramipril  (ALTACE ) 10 MG capsule Take 10 mg by mouth 2 (two) times daily.    [provider]  rivastigmine  (EXELON ) 6 MG capsule Take 6 mg by mouth 2 (two) times daily.     [provider]  rosuvastatin  (CRESTOR ) 10 MG tablet Take 1 tablet (10 mg total) by mouth daily. 11/24/23   Rhyne, Samantha J, PA-C  traMADol  (ULTRAM ) 50 MG tablet Take 1 tablet (50 mg total) by mouth every 6 (six) hours as needed for moderate pain (pain score 4-6). 11/24/23   Rhyne, Samantha J, PA-C  vitamin B-12 (CYANOCOBALAMIN ) 500 MCG tablet Take 500 mcg by mouth every other day.    [provider]  amiodarone  (PACERONE ) 200 MG tablet Take 1 tablet (200 mg total) by mouth daily. Patient not taking: Reported on 06/02/2020 05/22/20 06/02/20  Faith Homes, MD    Physical Exam: Vitals:   12/06/23 2240 12/06/23 2300  BP: (!) 159/59 (!) 162/68  Pulse: 82 78  Resp: (!) 21 20  Temp: 98.5 F (36.9 C)   TempSrc: Oral   SpO2: 97% 100%    Constitutional: NAD, calm  Eyes: PERTLA, lids and conjunctivae normal ENMT: Mucous membranes are moist. Posterior pharynx clear of any exudate or lesions.   Neck: supple, no masses  Respiratory: no wheezing, no crackles. No accessory muscle use.  Cardiovascular: S1 & S2 heard, regular rate and rhythm. No JVD. Abdomen: No tenderness, soft. Bowel sounds active.  Musculoskeletal: no clubbing / cyanosis. No joint deformity upper and lower extremities.   Skin: no significant rashes, lesions, ulcers. Warm, dry, well-perfused. Neurologic: CN 2-12 grossly intact. Sensation to light touch intact. Strength 5/5 in all 4 limbs. Alert and oriented to person and place only.  Psychiatric: Pleasant. Cooperative.    Labs and Imaging on Admission: I have personally reviewed following labs and imaging studies  CBC: Recent Labs  Lab 12/06/23 2349  WBC 18.5*  NEUTROABS 14.8*  HGB 12.1*  HCT 37.0*  MCV 94.4  PLT 289   Basic Metabolic Panel: Recent Labs  Lab 12/06/23 2349  NA 137  K 2.7*  CL 95*  CO2 26  GLUCOSE 153*  BUN 21  CREATININE 1.49*  CALCIUM  7.4*  MG 1.8   GFR: CrCl cannot be calculated (Unknown ideal weight.). Liver Function  Tests: Recent Labs  Lab 12/06/23 2349  AST 15  ALT 9  ALKPHOS 71  BILITOT  1.3*  PROT 7.1  ALBUMIN  3.0*   No results for input(s): LIPASE, AMYLASE in the last 168 hours. No results for input(s): AMMONIA in the last 168 hours. Coagulation Profile: Recent Labs  Lab 12/06/23 2349  INR 1.8*   Cardiac Enzymes: Recent Labs  Lab 12/06/23 2349  CKTOTAL 144   BNP (last 3 results) No results for input(s): PROBNP in the last 8760 hours. HbA1C: No results for input(s): HGBA1C in the last 72 hours. CBG: No results for input(s): GLUCAP in the last 168 hours. Lipid Profile: No results for input(s): CHOL, HDL, LDLCALC, TRIG, CHOLHDL, LDLDIRECT in the last 72 hours. Thyroid  Function Tests: No results for input(s): TSH, T4TOTAL, FREET4, T3FREE, THYROIDAB in the last 72 hours. Anemia Panel: No results for input(s): VITAMINB12, FOLATE, FERRITIN, TIBC, IRON, RETICCTPCT in the last 72 hours. Urine analysis:    Component Value Date/Time   COLORURINE YELLOW 11/11/2023 0912   APPEARANCEUR CLEAR 11/11/2023 0912   LABSPEC 1.016 11/11/2023 0912   PHURINE 5.0 11/11/2023 0912   GLUCOSEU NEGATIVE 11/11/2023 0912   HGBUR MODERATE (A) 11/11/2023 0912   BILIRUBINUR NEGATIVE 11/11/2023 0912   KETONESUR NEGATIVE 11/11/2023 0912   PROTEINUR 100 (A) 11/11/2023 0912   UROBILINOGEN 0.2 07/02/2014 1800   NITRITE NEGATIVE 11/11/2023 0912   LEUKOCYTESUR NEGATIVE 11/11/2023 0912   Sepsis Labs: @LABRCNTIP (procalcitonin:4,lacticidven:4) )No results found for this or any previous visit (from the past 240 hours).   Radiological Exams on Admission: CT HEAD WO CONTRAST ( ) Result Date: 12/07/2023 CLINICAL DATA:  Initial evaluation for acute blood polytrauma. EXAM: CT HEAD WITHOUT CONTRAST TECHNIQUE: Contiguous axial images were obtained from the base of the skull through the vertex without intravenous contrast. RADIATION DOSE REDUCTION: This exam was performed  according to the departmental dose-optimization program which includes automated exposure control, adjustment of the mA and/or kV according to patient size and/or use of iterative reconstruction technique. COMPARISON:  None Available. FINDINGS: Brain: Generalized age-related cerebral atrophy with moderate chronic microvascular ischemic disease. Chronic left PCA distribution infarct. Associated ex vacuo dilatation of the left lateral ventricle. Small remote left cerebellar infarct. In the high left frontal lobe, there is a 1.2 cm linear hyperdensity (series 3, image 13). While this could reflect a small amount of acute posttraumatic subarachnoid hemorrhage, this appears to be parenchymal in location, and could reflect a small focus of mineralization and/or laminar necrosis. No significant surrounding edema. No other acute intracranial hemorrhage. No acute large vessel territory infarct. No mass lesion or midline shift. Ventricular prominence related global parenchymal volume loss of hydrocephalus. No extra-axial fluid collection. Vascular: No abnormal hyperdense vessel. Calcified atherosclerosis present at skull base. Skull: No appreciable scalp soft tissue abnormality. Calvarium intact. Sinuses/Orbits: Globes orbital soft tissues demonstrate no acute finding. Mild mucosal thickening about the ethmoidal air cells and right maxillary sinus. Paranasal sinuses are otherwise clear. No mastoid effusion. Other: None. IMPRESSION: 1. 1.2 cm linear hyperdensity involving the high left frontal lobe. While this could reflect a small amount of acute posttraumatic subarachnoid hemorrhage, this appears to be potentially be parenchymal in nature, and could reflect a small focus of mineralization and/or laminar necrosis. No significant surrounding edema. Short interval follow-up CT in 6 hours to evaluate for potential interval change is recommended. 2. No other acute intracranial abnormality. 3. Chronic left PCA distribution  infarct, with additional small remote left cerebellar infarct. 4. Underlying atrophy with moderate chronic microvascular ischemic disease. Results discussed by telephone at the time of interpretation on 12/07/2023 at 2:00 am to provider  Sherel Dikes , who verbally acknowledged these results. Electronically Signed   By: Virgia Griffins M.D.   On: 12/07/2023 02:12   CT ABDOMEN PELVIS W CONTRAST Result Date: 12/07/2023 EXAM: CT ABDOMEN AND PELVIS WITH CONTRAST 12/07/2023 01:18:15 AM TECHNIQUE: CT of the abdomen and pelvis was performed with the administration of intravenous contrast. Multiplanar reformatted images are provided for review. Automated exposure control, iterative reconstruction, and/or weight based adjustment of the mA/kV was utilized to reduce the radiation dose to as low as reasonably achievable. COMPARISON: PET/CT dated 08/21/2023. CLINICAL HISTORY: Abdominal pain, acute, nonlocalized. Chief complaints; Weakness; Diarrhea; CT HEAD WO CONTRAST ( ); Polytrauma, blunt; CT ABDOMEN PELVIS W CONTRAST; Abdominal pain, acute, nonlocalized. FINDINGS: LOWER CHEST: No acute abnormality. LIVER: The liver is unremarkable. GALLBLADDER AND BILE DUCTS: Gallbladder is unremarkable. No biliary ductal dilatation. SPLEEN: No acute abnormality. PANCREAS: No acute abnormality. ADRENAL GLANDS: No acute abnormality. KIDNEYS, URETERS AND BLADDER: No stones in the kidneys or ureters. No hydronephrosis. No perinephric or periureteral stranding. Urinary bladder is unremarkable. GI AND BOWEL: Stomach demonstrates no acute abnormality. There is no bowel obstruction. No bowel wall thickening. Left colonic diverticulosis without convincing inflammatory changes to suggest diverticulitis. Prior appendectomy. PERITONEUM AND RETROPERITONEUM: No ascites. No free air. VASCULATURE: Atherosclerotic calcifications of the abdominal aorta and branch vessels, although patent. Bilateral iliac stents. LYMPH NODES: No lymphadenopathy.  REPRODUCTIVE ORGANS: Prostatomegaly, with enlargement of the central gland, indenting the base of the bladder. BONES AND SOFT TISSUES: Moderate degenerative changes of the visualized thoracolumbar spine. Postsurgical changes in the right groin. IMPRESSION: 1. No traumatic injury to the abdomen or pelvis. 2. Left colonic diverticulosis, without evidence of diverticulitis. 3. Additionally ancillary findings as above. Electronically signed by: Zadie Herter MD 12/07/2023 01:37 AM EDT RP Workstation: [id number]   DG Chest Port 1 View Result Date: 12/07/2023 CLINICAL DATA:  Left shoulder pain.  Questionable sepsis. EXAM: PORTABLE CHEST 1 VIEW LEFT SHOULDER ROUTINE THREE VIEWS COMPARISON:  PET-CT 08/21/2023.  No prior shoulder dedicated study. FINDINGS: Chest AP portable.  11:51 p.m.: The lungs are clear. There is mild cardiomegaly without evidence of CHF. No pleural effusion is seen. Mediastinum is normally outlined.  There is aortic atherosclerosis. Six slight thoracic dextroscoliosis with osteopenia and degenerative change. Multiple overlying telemetry leads. Left shoulder: There is osteopenia without evidence of fractures or dislocation. There is mild osteophytosis at the [facility] joint. There is labral chondrocalcinosis. Trace calcification at the humeral cuff insertion which may indicate calcific tendinitis or tendinopathy. Soft tissues are otherwise unremarkable. No fracture of the visualized left ribcage. IMPRESSION: 1. No evidence of acute chest disease. Mild cardiomegaly. 2. Osteopenia and degenerative change without evidence of left shoulder fractures or dislocation. 3. Labral chondrocalcinosis. 4. Trace calcification at the humeral cuff insertion which may indicate calcific tendinitis or tendinopathy. Electronically Signed   By: Denman Fischer M.D.   On: 12/07/2023 00:21   DG Shoulder Left Result Date: 12/07/2023 CLINICAL DATA:  Left shoulder pain.  Questionable sepsis. EXAM: PORTABLE CHEST 1 VIEW LEFT  SHOULDER ROUTINE THREE VIEWS COMPARISON:  PET-CT 08/21/2023.  No prior shoulder dedicated study. FINDINGS: Chest AP portable.  11:51 p.m.: The lungs are clear. There is mild cardiomegaly without evidence of CHF. No pleural effusion is seen. Mediastinum is normally outlined.  There is aortic atherosclerosis. Six slight thoracic dextroscoliosis with osteopenia and degenerative change. Multiple overlying telemetry leads. Left shoulder: There is osteopenia without evidence of fractures or dislocation. There is mild osteophytosis at the [facility] joint. There is labral chondrocalcinosis. Trace  calcification at the humeral cuff insertion which may indicate calcific tendinitis or tendinopathy. Soft tissues are otherwise unremarkable. No fracture of the visualized left ribcage. IMPRESSION: 1. No evidence of acute chest disease. Mild cardiomegaly. 2. Osteopenia and degenerative change without evidence of left shoulder fractures or dislocation. 3. Labral chondrocalcinosis. 4. Trace calcification at the humeral cuff insertion which may indicate calcific tendinitis or tendinopathy. Electronically Signed   By: Denman Fischer M.D.   On: 12/07/2023 00:21    EKG: Independently reviewed. Sinus rhythm, RBBB, LAFB.   Assessment/Plan   1. Suspected SAH  - Hold Eliquis  and Plavix , neuro checks, repeat CT at 07:30 am   2. Diarrhea  - No acute findings on CT abdomen/pelvis; marked leukocytosis noted    - Check C diff and GI pathogen panel, use enteric precautions for now   3. Hypokalemia  - Replacing    4. PAF  - Hold Eliquis , continue metoprolol     5. PAD  - No acute ischemia  - Hold Plavix , continue Crestor     6. CKD 3A  - Appears slightly up from baseline  - Renally-dose medications, monitor   7. Dementia  - Continue Namenda  and Exelon , use delirium precautions     DVT prophylaxis: SCDs  Code Status: Full  Level of Care: Level of care: Telemetry Medical Family Communication: Family updated from ED    Disposition Plan:  Patient is from: Home  Anticipated d/c is to: TBD Anticipated d/c date is: 12/08/23  Patient currently: pending repeat CT, stool studies Consults called: none  Admission status: Observation     Walton Guppy, MD [facility]  12/07/2023, 2:53 AM

## 2023-12-07 NOTE — ED Notes (Signed)
Assisted pt with bedside commode.

## 2023-12-08 ENCOUNTER — Inpatient Hospital Stay (HOSPITAL_COMMUNITY)

## 2023-12-08 DIAGNOSIS — I6789 Other cerebrovascular disease: Secondary | ICD-10-CM | POA: Diagnosis present

## 2023-12-08 DIAGNOSIS — R2681 Unsteadiness on feet: Secondary | ICD-10-CM | POA: Diagnosis not present

## 2023-12-08 DIAGNOSIS — D72829 Elevated white blood cell count, unspecified: Secondary | ICD-10-CM | POA: Diagnosis present

## 2023-12-08 DIAGNOSIS — M542 Cervicalgia: Secondary | ICD-10-CM | POA: Diagnosis present

## 2023-12-08 DIAGNOSIS — E860 Dehydration: Secondary | ICD-10-CM | POA: Diagnosis present

## 2023-12-08 DIAGNOSIS — N400 Enlarged prostate without lower urinary tract symptoms: Secondary | ICD-10-CM | POA: Diagnosis present

## 2023-12-08 DIAGNOSIS — S51012A Laceration without foreign body of left elbow, initial encounter: Secondary | ICD-10-CM | POA: Diagnosis present

## 2023-12-08 DIAGNOSIS — R197 Diarrhea, unspecified: Secondary | ICD-10-CM | POA: Diagnosis not present

## 2023-12-08 DIAGNOSIS — E1151 Type 2 diabetes mellitus with diabetic peripheral angiopathy without gangrene: Secondary | ICD-10-CM | POA: Diagnosis present

## 2023-12-08 DIAGNOSIS — R531 Weakness: Secondary | ICD-10-CM | POA: Diagnosis present

## 2023-12-08 DIAGNOSIS — K219 Gastro-esophageal reflux disease without esophagitis: Secondary | ICD-10-CM | POA: Diagnosis not present

## 2023-12-08 DIAGNOSIS — E785 Hyperlipidemia, unspecified: Secondary | ICD-10-CM | POA: Diagnosis present

## 2023-12-08 DIAGNOSIS — F0390 Unspecified dementia without behavioral disturbance: Secondary | ICD-10-CM | POA: Diagnosis not present

## 2023-12-08 DIAGNOSIS — I25119 Atherosclerotic heart disease of native coronary artery with unspecified angina pectoris: Secondary | ICD-10-CM | POA: Diagnosis not present

## 2023-12-08 DIAGNOSIS — I130 Hypertensive heart and chronic kidney disease with heart failure and stage 1 through stage 4 chronic kidney disease, or unspecified chronic kidney disease: Secondary | ICD-10-CM | POA: Diagnosis present

## 2023-12-08 DIAGNOSIS — Z95828 Presence of other vascular implants and grafts: Secondary | ICD-10-CM | POA: Diagnosis not present

## 2023-12-08 DIAGNOSIS — I5032 Chronic diastolic (congestive) heart failure: Secondary | ICD-10-CM | POA: Diagnosis not present

## 2023-12-08 DIAGNOSIS — I2510 Atherosclerotic heart disease of native coronary artery without angina pectoris: Secondary | ICD-10-CM | POA: Diagnosis present

## 2023-12-08 DIAGNOSIS — M810 Age-related osteoporosis without current pathological fracture: Secondary | ICD-10-CM | POA: Diagnosis not present

## 2023-12-08 DIAGNOSIS — I10 Essential (primary) hypertension: Secondary | ICD-10-CM | POA: Diagnosis not present

## 2023-12-08 DIAGNOSIS — E1122 Type 2 diabetes mellitus with diabetic chronic kidney disease: Secondary | ICD-10-CM | POA: Diagnosis not present

## 2023-12-08 DIAGNOSIS — I480 Paroxysmal atrial fibrillation: Secondary | ICD-10-CM | POA: Diagnosis not present

## 2023-12-08 DIAGNOSIS — I739 Peripheral vascular disease, unspecified: Secondary | ICD-10-CM | POA: Diagnosis not present

## 2023-12-08 DIAGNOSIS — Y92019 Unspecified place in single-family (private) house as the place of occurrence of the external cause: Secondary | ICD-10-CM | POA: Diagnosis not present

## 2023-12-08 DIAGNOSIS — F03A0 Unspecified dementia, mild, without behavioral disturbance, psychotic disturbance, mood disturbance, and anxiety: Secondary | ICD-10-CM | POA: Diagnosis present

## 2023-12-08 DIAGNOSIS — M6281 Muscle weakness (generalized): Secondary | ICD-10-CM | POA: Diagnosis not present

## 2023-12-08 DIAGNOSIS — I70221 Atherosclerosis of native arteries of extremities with rest pain, right leg: Secondary | ICD-10-CM | POA: Diagnosis not present

## 2023-12-08 DIAGNOSIS — N1831 Chronic kidney disease, stage 3a: Secondary | ICD-10-CM | POA: Diagnosis not present

## 2023-12-08 DIAGNOSIS — W19XXXD Unspecified fall, subsequent encounter: Secondary | ICD-10-CM | POA: Diagnosis not present

## 2023-12-08 DIAGNOSIS — C3431 Malignant neoplasm of lower lobe, right bronchus or lung: Secondary | ICD-10-CM | POA: Diagnosis not present

## 2023-12-08 DIAGNOSIS — W1830XA Fall on same level, unspecified, initial encounter: Secondary | ICD-10-CM | POA: Diagnosis present

## 2023-12-08 DIAGNOSIS — J449 Chronic obstructive pulmonary disease, unspecified: Secondary | ICD-10-CM | POA: Diagnosis present

## 2023-12-08 DIAGNOSIS — E876 Hypokalemia: Secondary | ICD-10-CM | POA: Diagnosis present

## 2023-12-08 DIAGNOSIS — Z95820 Peripheral vascular angioplasty status with implants and grafts: Secondary | ICD-10-CM | POA: Diagnosis not present

## 2023-12-08 DIAGNOSIS — R7989 Other specified abnormal findings of blood chemistry: Secondary | ICD-10-CM | POA: Diagnosis present

## 2023-12-08 DIAGNOSIS — E1169 Type 2 diabetes mellitus with other specified complication: Secondary | ICD-10-CM | POA: Diagnosis not present

## 2023-12-08 DIAGNOSIS — D631 Anemia in chronic kidney disease: Secondary | ICD-10-CM | POA: Diagnosis present

## 2023-12-08 DIAGNOSIS — I609 Nontraumatic subarachnoid hemorrhage, unspecified: Secondary | ICD-10-CM | POA: Diagnosis not present

## 2023-12-08 DIAGNOSIS — Z7901 Long term (current) use of anticoagulants: Secondary | ICD-10-CM | POA: Diagnosis not present

## 2023-12-08 DIAGNOSIS — R627 Adult failure to thrive: Secondary | ICD-10-CM | POA: Diagnosis present

## 2023-12-08 DIAGNOSIS — E782 Mixed hyperlipidemia: Secondary | ICD-10-CM | POA: Diagnosis not present

## 2023-12-08 DIAGNOSIS — I452 Bifascicular block: Secondary | ICD-10-CM | POA: Diagnosis present

## 2023-12-08 LAB — BASIC METABOLIC PANEL WITH GFR
Anion gap: 10 (ref 5–15)
BUN: 20 mg/dL (ref 8–23)
CO2: 24 mmol/L (ref 22–32)
Calcium: 6.2 mg/dL — CL (ref 8.9–10.3)
Chloride: 102 mmol/L (ref 98–111)
Creatinine, Ser: 1.18 mg/dL (ref 0.61–1.24)
GFR, Estimated: >60 mL/min (ref >60)
Glucose, Bld: 101 mg/dL — ABNORMAL HIGH (ref 70–99)
Potassium: 3 mmol/L — ABNORMAL LOW (ref 3.5–5.1)
Sodium: 136 mmol/L (ref 135–145)

## 2023-12-08 LAB — CBC
HCT: 27.8 % — ABNORMAL LOW (ref 39.0–52.0)
Hemoglobin: 9 g/dL — ABNORMAL LOW (ref 13.0–17.0)
MCH: 30.5 pg (ref 26.0–34.0)
MCHC: 32.4 g/dL (ref 30.0–36.0)
MCV: 94.2 fL (ref 80.0–100.0)
Platelets: 217 10*3/uL (ref 150–400)
RBC: 2.95 MIL/uL — ABNORMAL LOW (ref 4.22–5.81)
RDW: 12.6 % (ref 11.5–15.5)
WBC: 11.1 10*3/uL — ABNORMAL HIGH (ref 4.0–10.5)
nRBC: 0 % (ref 0.0–0.2)

## 2023-12-08 MED ORDER — POTASSIUM CHLORIDE CRYS ER 20 MEQ PO TBCR
40.0000 meq | EXTENDED_RELEASE_TABLET | ORAL | Status: AC
  Administered 2023-12-08 (×2): 40 meq via ORAL
  Filled 2023-12-08 (×2): qty 2

## 2023-12-08 MED ORDER — METHOCARBAMOL 500 MG PO TABS
500.0000 mg | ORAL_TABLET | Freq: Four times a day (QID) | ORAL | Status: DC | PRN
  Administered 2023-12-08 – 2023-12-10 (×5): 500 mg via ORAL
  Filled 2023-12-08 (×5): qty 1

## 2023-12-08 MED ORDER — AMLODIPINE BESYLATE 10 MG PO TABS
10.0000 mg | ORAL_TABLET | Freq: Every day | ORAL | Status: DC
  Administered 2023-12-08: 10 mg via ORAL
  Filled 2023-12-08: qty 1

## 2023-12-08 MED ORDER — LIDOCAINE 5 % EX PTCH
1.0000 | MEDICATED_PATCH | CUTANEOUS | Status: AC
  Administered 2023-12-08: 1 via TRANSDERMAL
  Filled 2023-12-08: qty 1

## 2023-12-08 MED ORDER — POTASSIUM CHLORIDE CRYS ER 20 MEQ PO TBCR: 40.0000 meq | EXTENDED_RELEASE_TABLET | Freq: Once | ORAL | Status: DC

## 2023-12-08 MED ORDER — CALCIUM GLUCONATE-NACL 2-0.675 GM/100ML-% IV SOLN
2.0000 g | Freq: Once | INTRAVENOUS | Status: AC
  Administered 2023-12-08: 2000 mg via INTRAVENOUS
  Filled 2023-12-08: qty 100

## 2023-12-08 MED ORDER — AMLODIPINE BESYLATE 5 MG PO TABS
5.0000 mg | ORAL_TABLET | Freq: Every day | ORAL | Status: DC
  Administered 2023-12-09 – 2023-12-10 (×2): 5 mg via ORAL
  Filled 2023-12-08 (×2): qty 1

## 2023-12-08 NOTE — Care Management Obs Status (Signed)
 MEDICARE OBSERVATION STATUS NOTIFICATION   Patient Details  Name: Donald Gordon MRN: [id number] Date of Birth: [birth year]   Medicare Observation Status Notification Given:  Yes    Felix Host 12/08/2023, 8:47 AM

## 2023-12-08 NOTE — Plan of Care (Signed)

## 2023-12-08 NOTE — Progress Notes (Signed)
 Pt oriented x1.  PT recommendation for SNF.  CSW spoke with pt in room briefly.  Two attempts to reach pt daughter [name], message left and message sent.   Baldo Bonds, MSW, LCSW 6/17/20252:30 PM

## 2023-12-08 NOTE — NC FL2 (Cosign Needed)
 [place]  MEDICAID FL2 LEVEL OF CARE FORM     IDENTIFICATION  Patient Name: Donald Gordon Birthdate: 1941/01/10 Sex: male Admission Date (Current Location): 12/06/2023  [facility] and [place] Number:  Producer, television/film/video and Address:  [facility], [address] [place], [place] [address]      Provider Number: [id number]  Attending Physician Name and Address:  Hoyt Macleod, MD  Relative Name and Phone Number:  Antonette Batters Daughter   [phone]    Current Level of Care: Hospital Recommended Level of Care: Skilled Nursing Facility Prior Approval Number:    Date Approved/Denied:   PASRR Number: [id number]  Discharge Plan: SNF    Current Diagnoses: Patient Active Problem List   Diagnosis Date Noted   SAH (subarachnoid hemorrhage) (HCC) 12/07/2023   Diarrhea 12/07/2023   Dementia (HCC) 12/07/2023   Status post femoral-popliteal bypass surgery 11/20/2023   Malignant neoplasm of lower lobe of right lung (HCC) 08/19/2023   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) 09/10/2022   Chronic kidney disease, stage 3a (HCC) 09/10/2022   Secondary hypercoagulable state (HCC) 01/07/2021   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) 09/03/2020   Right lower lobe pulmonary nodule 09/03/2020   Thrombosis of femoro-popliteal bypass graft (HCC) 06/02/2020   Chest pain 05/18/2020   Mixed diabetic hyperlipidemia associated with type 2 diabetes mellitus (HCC) 05/18/2020   Acute renal failure superimposed on stage 3a chronic kidney disease (HCC) 05/18/2020   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 esophagitis 05/18/2020   Chronic diastolic (congestive) heart failure (HCC) 05/18/2020   Critical limb ischemia with history of revascularization of same extremity (HCC) 01/18/2020   Critical lower limb ischemia (HCC) 08/31/2019   PAD (peripheral artery disease) (HCC) 07/23/2019   Ischemia of right lower extremity 01/09/2018   Critical ischemia of foot (HCC)  01/09/2018   Atrial fibrillation (HCC) 07/04/2014   Atrial fibrillation with rapid ventricular response (HCC)    Paroxysmal atrial fibrillation (HCC) 07/02/2014   Essential hypertension 07/02/2014   Atherosclerosis of native coronary artery of native heart with angina pectoris (HCC) 07/02/2014   Cough 07/02/2014   Atrial fibrillation with RVR (HCC) 07/02/2014   Type 2 diabetes mellitus with stage 3a chronic kidney disease, without long-term current use of insulin  (HCC) 07/02/2014    Orientation RESPIRATION BLADDER Height & Weight     Self  Normal Continent Weight: 165 lb 12.6 oz (75.2 kg) Height:  5' 9 (175.3 cm)  BEHAVIORAL SYMPTOMS/MOOD NEUROLOGICAL BOWEL NUTRITION STATUS      Continent Diet (see discharge summary)  AMBULATORY STATUS COMMUNICATION OF NEEDS Skin   Limited Assist Verbally Skin abrasions                       Personal Care Assistance Level of Assistance  Bathing, Feeding, Dressing Bathing Assistance: Limited assistance Feeding assistance: Limited assistance Dressing Assistance: Limited assistance     Functional Limitations Info  Sight, Hearing, Speech Sight Info: Adequate Hearing Info: Impaired Speech Info: Adequate    SPECIAL CARE FACTORS FREQUENCY  PT (By licensed PT), OT (By licensed OT)     PT Frequency: 5x week OT Frequency: 5x week            Contractures Contractures Info: Not present    Additional Factors Info  Code Status, Allergies Code Status Info: full Allergies Info: Oxycodone , Statins, Amiodarone , Donepezil, Imipramine, Oxybutynin Chloride           Current Medications (12/08/2023):  This is the  current hospital active medication list Current Facility-Administered Medications  Medication Dose Route Frequency Provider Last Rate Last Admin   acetaminophen  (TYLENOL ) tablet 650 mg  650 mg Oral Q6H PRN Opyd, Timothy S, MD   650 mg at 12/08/23 4782   Or   acetaminophen  (TYLENOL ) suppository 650 mg  650 mg Rectal Q6H PRN Opyd,  Timothy S, MD       [START ON 12/09/2023] amLODipine  (NORVASC ) tablet 5 mg  5 mg Oral Daily Dahal, Binaya, MD       apixaban  (ELIQUIS ) tablet 5 mg  5 mg Oral BID Dahal, Binaya, MD   5 mg at 12/08/23 0939   clopidogrel  (PLAVIX ) tablet 75 mg  75 mg Oral Daily Dahal, Binaya, MD   75 mg at 12/08/23 9562   ezetimibe  (ZETIA ) tablet 10 mg  10 mg Oral QPM Dahal, Aminta Baldy, MD   10 mg at 12/07/23 1723   ferrous sulfate  tablet 325 mg  325 mg Oral Q48H Dahal, Binaya, MD   325 mg at 12/07/23 1343   finasteride  (PROSCAR ) tablet 5 mg  5 mg Oral Daily Opyd, Timothy S, MD   5 mg at 12/08/23 1308   memantine  (NAMENDA ) tablet 20 mg  20 mg Oral QHS Opyd, Timothy S, MD   20 mg at 12/07/23 2127   methocarbamol (ROBAXIN) tablet 500 mg  500 mg Oral Q6H PRN Hoyt Macleod, MD   500 mg at 12/08/23 1305   metoprolol  tartrate (LOPRESSOR ) tablet 50 mg  50 mg Oral BID Opyd, Timothy S, MD   50 mg at 12/08/23 6578   pantoprazole  (PROTONIX ) EC tablet 40 mg  40 mg Oral QAC breakfast Opyd, Timothy S, MD   40 mg at 12/08/23 4696   rivastigmine  (EXELON ) capsule 6 mg  6 mg Oral BID Opyd, Timothy S, MD   6 mg at 12/08/23 2952   rosuvastatin  (CRESTOR ) tablet 10 mg  10 mg Oral Daily Opyd, Timothy S, MD   10 mg at 12/08/23 8413   sodium chloride  flush (NS) 0.9 % injection 3 mL  3 mL Intravenous Q12H Opyd, Timothy S, MD   3 mL at 12/08/23 0945     Discharge Medications: Please see discharge summary for a list of discharge medications.  Relevant Imaging Results:  Relevant Lab Results:   Additional Information SSN [id number]  Elspeth Hals, LCSW

## 2023-12-08 NOTE — TOC Initial Note (Signed)
 Transition of Care [facility]) - Initial/Assessment Note    Patient Details  Name: Donald Gordon MRN: [id number] Date of Birth: 07-11-1940  [facility] CM/SW Contact:    Elspeth Hals, LCSW Phone Number: 12/08/2023, 3:52 PM  Clinical Narrative:     Pt oriented x1, able to respond to CSW questions.  All information from pt daughter Lovett Ruck by phone.  Pt from home alone, no current services. He declined SNF placement during his recent admission earlier in the month and DC home with no services.  Discussed PT recommendation for SNF, daughter is agreeable, permission given to send out referral in the hub.  Pt was at [facility] a number of years ago, Melissa requesting [facility] again.  Referral sent out in hub for SNF, CSW reached out to [facility] to review.               Expected Discharge Plan: Skilled Nursing Facility Barriers to Discharge: Continued Medical Work up, SNF Pending bed offer   Patient Goals and CMS Choice     Choice offered to / list presented to : Adult Children (daughter [name])      Expected Discharge Plan and Services In-house Referral: Clinical Social Work   Post Acute Care Choice: Skilled Nursing Facility Living arrangements for the past 2 months: Single Family Home                                      Prior Living Arrangements/Services Living arrangements for the past 2 months: Single Family Home Lives with:: Self Patient language and need for interpreter reviewed:: Yes        Need for Family Participation in Patient Care: Yes (Comment) Care giver support system in place?: Yes (comment) Current home services: Other (comment) (none) Criminal Activity/Legal Involvement Pertinent to Current Situation/Hospitalization: No - Comment as needed  Activities of Daily Living   ADL Screening (condition at time of admission) Independently performs ADLs?: No Does the patient have a NEW difficulty with  bathing/dressing/toileting/self-feeding that is expected to last >3 days?: Yes (Initiates electronic notice to provider for possible OT consult) Does the patient have a NEW difficulty with getting in/out of bed, walking, or climbing stairs that is expected to last >3 days?: Yes (Initiates electronic notice to provider for possible PT consult) Does the patient have a NEW difficulty with communication that is expected to last >3 days?: No Is the patient deaf or have difficulty hearing?: Yes Does the patient have difficulty seeing, even when wearing glasses/contacts?: Yes Does the patient have difficulty concentrating, remembering, or making decisions?: Yes  Permission Sought/Granted                  Emotional Assessment Appearance:: Appears stated age Attitude/Demeanor/Rapport: Engaged Affect (typically observed): Pleasant Orientation: : Oriented to Self      Admission diagnosis:  Hypokalemia [E87.6] SAH (subarachnoid hemorrhage) (HCC) [I60.9] Failure to thrive in adult [R62.7] Fall, initial encounter [W19.XXXA] Diarrhea, unspecified type [R19.7] Patient Active Problem List   Diagnosis Date Noted   SAH (subarachnoid hemorrhage) (HCC) 12/07/2023   Diarrhea 12/07/2023   Dementia (HCC) 12/07/2023   Status post femoral-popliteal bypass surgery 11/20/2023   Malignant neoplasm of lower lobe of right lung (HCC) 08/19/2023   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) 09/10/2022   Chronic kidney disease, stage 3a (HCC) 09/10/2022   Secondary hypercoagulable state (HCC) 01/07/2021   Embolism and  thrombosis of arteries of lower extremities (HCC) 09/03/2020   Right lower lobe pulmonary nodule 09/03/2020   Thrombosis of femoro-popliteal bypass graft (HCC) 06/02/2020   Chest pain 05/18/2020   Mixed diabetic hyperlipidemia associated with type 2 diabetes mellitus (HCC) 05/18/2020   Acute renal failure superimposed on stage 3a chronic kidney disease (HCC) 05/18/2020   GERD  without esophagitis 05/18/2020   Chronic diastolic (congestive) heart failure (HCC) 05/18/2020   Critical limb ischemia with history of revascularization of same extremity (HCC) 01/18/2020   Critical lower limb ischemia (HCC) 08/31/2019   PAD (peripheral artery disease) (HCC) 07/23/2019   Ischemia of right lower extremity 01/09/2018   Critical ischemia of foot (HCC) 01/09/2018   Atrial fibrillation (HCC) 07/04/2014   Atrial fibrillation with rapid ventricular response (HCC)    Paroxysmal atrial fibrillation (HCC) 07/02/2014   Essential hypertension 07/02/2014   Atherosclerosis of native coronary artery of native heart with angina pectoris (HCC) 07/02/2014   Cough 07/02/2014   Atrial fibrillation with RVR (HCC) 07/02/2014   Type 2 diabetes mellitus with stage 3a chronic kidney disease, without long-term current use of insulin  (HCC) 07/02/2014   PCP:  [facility]:   [facility] - [place], [place] - [address] [place] [address] Phone: [phone] Fax: [phone]  [id number] - [place], [place] - [address] [place] [address] Phone: [phone] Fax: [phone]  [facility] [address] [place] [address] Phone: [phone] Fax: [phone]     Social Drivers of Health (SDOH) Social History: SDOH Screenings   Food Insecurity: No Food Insecurity (12/07/2023)  Housing: Low Risk  (12/07/2023)  Transportation Needs: No Transportation Needs (12/07/2023)  Utilities: Not At Risk (12/07/2023)  Social Connections: Moderately Isolated (12/07/2023)  Stress: No Stress Concern Present (10/16/2022)   Received from [facility]  Tobacco Use: Medium Risk (12/07/2023)   SDOH Interventions:     Readmission Risk Interventions    11/24/2023   11:16 AM  Readmission Risk Prevention Plan  Transportation Screening Complete  Home  Care Screening Complete  Medication Review (RN CM) Complete

## 2023-12-08 NOTE — Evaluation (Signed)
 Occupational Therapy Evaluation Patient Details Name: Donald Gordon MRN: [id number] DOB: Nov 18, 1940 Today's Date: 12/08/2023   History of Present Illness   83 yo male presents to [facility] 6/15 with diarrhea, poor appetite, weakness, fall. CT abdomen pelvis showed left colonic diverticulosis without evidence of diverticulitis. 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 Repeat CT 6/16 shows lesion area is more compatible with laminar necrosis and NOT SAH. Recent admission with RLE fempop bypass 5/30, PT recommending SNF at that time and pt went home instead. PMH includes dementia, DM2, HTN, HLD, OSA, CAD/MI/stent, arrhythmia, PAD s/p stent/bypass, paroxysmal A-fib on Eliquis , stroke, GERD, COPD, arthritis.     Clinical Impressions PTA, pt lives alone, reports typically Modified Independent with ADLs, IADLs and mobility using RW. However, pt does endorse recent difficulty managing these tasks and confirms recent falls. Pt presents now with deficits in standing balance, endurance, pain (R side of neck most significant), strength and cognition. Pt requires Mod A for bed mobility, Min-CGA for transfers with RW. Pt able to complete bathing EOB with Min A for UB ADL and Mod A for LB ADLs. Based on current assist needed and decreased support at DC, recommend continued inpatient follow up therapy, <3 hours/day at DC.     If plan is discharge home, recommend the following:   A little help with walking and/or transfers;A little help with bathing/dressing/bathroom;Assistance with cooking/housework;Direct supervision/assist for financial management;Direct supervision/assist for medications management;Assist for transportation;Help with stairs or ramp for entrance     Functional Status Assessment   Patient has had a recent decline in their functional status and demonstrates the ability to make significant improvements in function in a reasonable and predictable amount of  time.     Equipment Recommendations   Tub/shower bench     Recommendations for Other Services         Precautions/Restrictions   Precautions Precautions: Fall Precaution/Restrictions Comments: R groin and R lower leg medial incision, s/p fempop bypass 5/30 Restrictions Weight Bearing Restrictions Per Provider Order: No     Mobility Bed Mobility Overal bed mobility: Needs Assistance Bed Mobility: Supine to Sit     Supine to sit: Mod assist     General bed mobility comments: Mod A to lift trunk in bed, scoot hips to EOB. pt reported limitations due to R sided neck pain    Transfers Overall transfer level: Needs assistance Equipment used: Rolling walker (2 wheels) Transfers: Sit to/from Stand, Bed to chair/wheelchair/BSC Sit to Stand: Min assist     Step pivot transfers: Contact guard assist     General transfer comment: Min A to stand from bedside, CGA to walk to recliner, turn (assist with IV line d/t pt impulsive with this)      Balance Overall balance assessment: Needs assistance Sitting-balance support: Feet supported Sitting balance-Leahy Scale: Good     Standing balance support: During functional activity, Bilateral upper extremity supported, Reliant on assistive device for balance Standing balance-Leahy Scale: Poor                             ADL either performed or assessed with clinical judgement   ADL Overall ADL's : Needs assistance/impaired Eating/Feeding: Set up;Sitting   Grooming: Set up;Sitting;Wash/dry face;Applying deodorant   Upper Body Bathing: Minimal assistance;Sitting Upper Body Bathing Details (indicate cue type and reason): assist to bathe back Lower Body Bathing: Moderate assistance;Sitting/lateral leans;Sit to/from stand Lower Body  Bathing Details (indicate cue type and reason): assist to bathe lower LE and bottom in standing Upper Body Dressing : Set up;Sitting   Lower Body Dressing: Moderate assistance;Sit  to/from stand;Sitting/lateral leans   Toilet Transfer: Minimal assistance;Ambulation;Contact guard assist;Rolling walker (2 wheels)   Toileting- Clothing Manipulation and Hygiene: Minimal assistance;Sitting/lateral lean;Sit to/from stand               Vision Baseline Vision/History: 1 Wears glasses Ability to See in Adequate Light: 0 Adequate Patient Visual Report: No change from baseline Vision Assessment?: No apparent visual deficits     Perception         Praxis         Pertinent Vitals/Pain Pain Assessment Pain Assessment: Faces Faces Pain Scale: Hurts even more Pain Location: R side of neck, back Pain Descriptors / Indicators: Discomfort Pain Intervention(s): Monitored during session, Limited activity within patient's tolerance, Heat applied, Repositioned     Extremity/Trunk Assessment Upper Extremity Assessment Upper Extremity Assessment: Generalized weakness;Right hand dominant   Lower Extremity Assessment Lower Extremity Assessment: Defer to PT evaluation   Cervical / Trunk Assessment Cervical / Trunk Assessment: Kyphotic   Communication Communication Communication: No apparent difficulties   Cognition Arousal: Alert Behavior During Therapy: WFL for tasks assessed/performed Cognition: Cognition impaired     Awareness: Intellectual awareness intact, Online awareness impaired   Attention impairment (select first level of impairment): Selective attention Executive functioning impairment (select all impairments): Sequencing, Problem solving OT - Cognition Comments: appropriate in conversation, decreased insight into deficits at times. cues for sequencing transfers w/ IV line                 Following commands: Intact       Cueing  General Comments   Cueing Techniques: Verbal cues      Exercises     Shoulder Instructions      Home Living Family/patient expects to be discharged to:: Private residence Living Arrangements:  Alone Available Help at Discharge: Family;Available PRN/intermittently;Friend(s) Type of Home: House Home Access: Stairs to enter [employer] of Steps: 3 Entrance Stairs-Rails: Can reach both Home Layout: One level     Bathroom Shower/Tub: Chief Strategy Officer: Standard     Home Equipment: Rollator (4 wheels);Cane - single point;Wheelchair - manual;BSC/3in1          Prior Functioning/Environment Prior Level of Function : Independent/Modified Independent;Driving             Mobility Comments: uses RW for mobility, states he has been struggling at home since recent d/c but was not requiring physical assist from anyone. ADLs Comments: Pt reports being Independent with ADLs, IADLs but recent difficulty. Noted EMS found pt covered in stool    OT Problem List: Decreased strength;Decreased activity tolerance;Decreased range of motion;Impaired balance (sitting and/or standing);Cardiopulmonary status limiting activity;Pain   OT Treatment/Interventions: Self-care/ADL training;Therapeutic exercise;Energy conservation;DME and/or AE instruction;Therapeutic activities;Patient/family education;Balance training      OT Goals(Current goals can be found in the care plan section)   Acute Rehab OT Goals Patient Stated Goal: consider home vs rehab OT Goal Formulation: With patient Time For Goal Achievement: 12/22/23 Potential to Achieve Goals: Good   OT Frequency:  Min 2X/week    Co-evaluation              AM-PAC OT 6 Clicks Daily Activity     Outcome Measure Help from another person eating meals?: None Help from another person taking care of personal grooming?: A Little Help from another  person toileting, which includes using toliet, bedpan, or urinal?: A Little Help from another person bathing (including washing, rinsing, drying)?: A Lot Help from another person to put on and taking off regular upper body clothing?: A Little Help from another  person to put on and taking off regular lower body clothing?: A Lot 6 Click Score: 17   End of Session Equipment Utilized During Treatment: Rolling walker (2 wheels) Nurse Communication: Mobility status;Patient requests pain meds  Activity Tolerance: Patient tolerated treatment well Patient left: in chair;with call bell/phone within reach;with chair alarm set  OT Visit Diagnosis: Unsteadiness on feet (R26.81);Other abnormalities of gait and mobility (R26.89);Muscle weakness (generalized) (M62.81)                Time: 1100-1133 OT Time Calculation (min): 33 min Charges:  OT General Charges $OT Visit: 1 Visit OT Evaluation $OT Eval Moderate Complexity: 1 Mod OT Treatments $Self Care/Home Management : 8-22 mins  Lawrence Pretty, OTR/L Acute Rehab Services Office: [phone]   Shireen Dory 12/08/2023, 12:42 PM

## 2023-12-08 NOTE — Progress Notes (Signed)
 PROGRESS NOTE  Donald Gordon  DOB: [birth year]  PCP: [facility] NWG:[id number]  DOA: 12/06/2023  LOS: 0 days  Hospital Day: 3  Brief narrative: Donald Gordon is a 83 y.o. male with PMH significant for DM2, HTN, HLD, OSA, CAD/MI/stent, arrhythmia, PAD s/p stent/bypass, paroxysmal A-fib on Eliquis , stroke, GERD, COPD, arthritis. 6/15, patient presented to the ED with complaint of diarrhea, loss of appetite, generalized weakness and a fall.    5/30, patient had right lower extremity femoropopliteal bypass and stenting done.  He was admitted under vascular surgery at that time.  PT eval was obtained and recommended SNF but patient chose to go home. Patient lives alone, daughter lives in different town.  Patient states he tries his best to stay active and is starting to be mobile again. 6/12, patient developed diarrhea which persisted.  He lost his appetite, got dehydrated.  Got progressively weak.  The night before presentation, patient felt lightheaded, fell to the floor and sustained a wound on his left elbow.  Next day, his daughter happened to check with the neighbors who stated they have not seen him out for few days.  EMS was called and patient was brought to the ED.  CT head showed 1.2 cm linear hyperdensity involving the high left frontal lobe that reflects a small amount of acute posttraumatic SAH, no significant surrounding edema.    Neurosurgery (Dr. Michale Age) was consulted by the ED PA.  Recommended to repeat CT head in 6 hours. Admitted to [facility]. Repeat CT head this morning 6/16 showed that the curvilinear radiopacity noted within the left anterior frontal lobe appears to clearly be within the cortex and is compatible with laminar necrosis. There is no convincing evidence of intracranial hemorrhage.  Subjective: Patient was seen and examined this morning. Lying on bed.  Not in distress.  Family not at bedside. 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 No intervention needed per neurosurgery Eliquis  and Plavix  were initially held  Since Donald Gordon was ruled out, Eliquis  and Plavix  were resumed given recent vascular intervention.   Diarrhea  Started on 6/12 and persisted leading to dehydration weakness and fall  No acute findings on CT abdomen/pelvis marked leukocytosis on admission noted. Check C diff and GI pathogen panel ordered but diarrhea seems to be slowing down already.  WC count improving as well.  Not on antibiotics. Currently on NS at 75 mL/h.  I would stop IV fluid at this time. Recent Labs  Lab 12/06/23 2349 12/06/23 2358 12/07/23 0500 12/08/23 0635  WBC 18.5*  --  15.6* 11.1*  LATICACIDVEN  --  1.6  --   --      Hypokalemia  Potassium low at 3 this morning.  Oral replacement ordered. Recent Labs  Lab 12/06/23 2349 12/07/23 0500 12/08/23 0635  K 2.7* 3.3* 3.0*  MG 1.8  --   --    CKD 3A Creatinine baseline Recent Labs    10/28/23 0939 10/30/23 0819 11/11/23 0930 11/20/23 0707 11/20/23 1513 11/21/23 0026 12/06/23 2349 12/07/23 0500 12/08/23 0635  BUN 19 22 15 21   --  18 21 18 20   CREATININE 1.40* 1.40* 1.37* 1.30* 1.29* 1.41* 1.49* 1.25* 1.18   Acute anemia Baseline hemoglobin above 10.  Hemoglobin dropped to 9 this morning.  Likely dilutional. Watch out on Plavix   and Eliquis . Recent Labs    11/21/23 0026 11/23/23 0352 12/06/23 2349 12/07/23 0500 12/08/23 0635  HGB 10.3* 10.6* 12.1* 9.8* 9.0*  MCV 95.4 95.9 94.4 93.9 94.2   HTN PTA meds-metoprolol  tartrate 50 mg twice daily, HCTZ 12.5 mg daily, ramipril  10 mg twice daily Currently on metoprolol  only.  Blood pressure trending up.  Will continue to avoid HCTZ and ramipril .  Add amlodipine  5 mg daily. IV hydralazine  as needed  PAF  Continue metoprolol  Eliquis  resumed   CAD/MI s/p stent  PAD  s/p stent/bypass HLD No acute ischemia  Eliquis  and Plavix  resumed Continue Crestor   Past strokes  dementia  CT head showed chronic left PCA distribution infarct, with additional small remote left cerebellar infarct. Underlying atrophy with moderate chronic microvascular ischemic disease. Continue Namenda  and Exelon  delirium precautions    BPH finasteride   GERD PPI  Arthritis Left shoulder pain Left shoulder x-ray showed -Osteopenia and degenerative change without evidence of left shoulder fractures or dislocation. -Labral chondrocalcinosis. -Trace calcification at the humeral cuff insertion which may indicate calcific tendinitis or tendinopathy. Pain regimen with Tylenol  as needed  Diverticulosis Without evidence of diverticulitis Bowel regimen to be started once diarrhea improves  Impaired mobility PT recommended SNF   Mobility: PT eval ordered  Goals of care   Code Status: Full Code     DVT prophylaxis:  SCDs Start: 12/07/23 0253 apixaban  (ELIQUIS ) tablet 5 mg   Antimicrobials: None Fluid: Stop IV fluid today Consultants: Neurosurgery on the phone Family Communication: Daughter not at bedside today  Status: Observation Level of care:  Telemetry Medical   Patient is from: Home Needs to continue in-hospital care: Continue monitor diarrhea and electrolytes. Anticipated d/c to: PT recommended SNF    Diet: Started cardiac diet Diet Order             Diet Heart Fluid consistency: Thin  Diet effective now                   Scheduled Meds:  [START ON 12/09/2023] amLODipine   5 mg Oral Daily   apixaban   5 mg Oral BID   clopidogrel   75 mg Oral Daily   ezetimibe   10 mg Oral QPM   ferrous sulfate   325 mg Oral Q48H   finasteride   5 mg Oral Daily   memantine   20 mg Oral QHS   metoprolol  tartrate  50 mg Oral BID   pantoprazole   40 mg Oral QAC breakfast   potassium chloride   40 mEq Oral Q4H   rivastigmine   6 mg Oral BID   rosuvastatin   10 mg Oral  Daily   sodium chloride  flush  3 mL Intravenous Q12H    PRN meds: acetaminophen  **OR** acetaminophen    Infusions:     Antimicrobials: Anti-infectives (From admission, onward)    None       Objective: Vitals:   12/07/23 2021 12/08/23 0723  BP: (!) 185/61 (!) 152/63  Pulse: 79 86  Resp: 18 17  Temp: (!) 97.3 F (36.3 C) 98.3 F (36.8 C)  SpO2: 100% 95%    Intake/Output Summary (Last 24 hours) at 12/08/2023 1040 Last data filed at 12/08/2023 0900 Gross per 24 hour  Intake 257.69 ml  Output 400 ml  Net -142.31 ml   Filed Weights   12/07/23 0905  Weight: 75.2 kg   Weight change:  Body mass index is 24.48 kg/m.   Physical Exam: General exam: Pleasant, elderly Caucasian male.  Not in distress Skin: No rashes, lesions or  ulcers. HEENT: Atraumatic, normocephalic, no obvious bleeding Lungs: Clear to auscultation bilaterally,  CVS: S1, S2, no murmur,   GI/Abd: Soft, nontender, nondistended, bowel sound present,   CNS: Alert, awake, slow to respond but oriented x 3. Psychiatry: Mood appropriate,  Extremities: No pedal edema, no calf tenderness, mild abrasion on the left elbow.  Data Review: I have personally reviewed the laboratory data and studies available.  F/u labs ordered Unresulted Labs (From admission, onward)     Start     Ordered   12/07/23 0500  Basic metabolic panel  Daily,   R      12/07/23 0253   12/07/23 0500  CBC  Daily,   R      12/07/23 0253   12/07/23 0056  C Difficile Quick Screen w PCR reflex  (C Difficile quick screen w PCR reflex panel )  Once, for 24 hours,   URGENT       References:    CDiff Information Tool   12/07/23 0055   12/07/23 0056  Gastrointestinal Panel by PCR , Stool  (Gastrointestinal Panel by PCR, Stool                                                                                                                                                     **Does Not include CLOSTRIDIUM DIFFICILE testing. **If CDIFF testing is  needed, place order from the C Difficile Testing order set.**)  Once,   URGENT        12/07/23 0055            Signed, Hoyt Macleod, MD [facility] 12/08/2023

## 2023-12-09 LAB — BASIC METABOLIC PANEL WITH GFR
Anion gap: 9 (ref 5–15)
BUN: 15 mg/dL (ref 8–23)
CO2: 23 mmol/L (ref 22–32)
Calcium: 6.9 mg/dL — ABNORMAL LOW (ref 8.9–10.3)
Chloride: 102 mmol/L (ref 98–111)
Creatinine, Ser: 1.17 mg/dL (ref 0.61–1.24)
GFR, Estimated: >60 mL/min (ref >60)
Glucose, Bld: 108 mg/dL — ABNORMAL HIGH (ref 70–99)
Potassium: 3.8 mmol/L (ref 3.5–5.1)
Sodium: 134 mmol/L — ABNORMAL LOW (ref 135–145)

## 2023-12-09 LAB — CBC
HCT: 29 % — ABNORMAL LOW (ref 39.0–52.0)
Hemoglobin: 9.4 g/dL — ABNORMAL LOW (ref 13.0–17.0)
MCH: 30 pg (ref 26.0–34.0)
MCHC: 32.4 g/dL (ref 30.0–36.0)
MCV: 92.7 fL (ref 80.0–100.0)
Platelets: 232 10*3/uL (ref 150–400)
RBC: 3.13 MIL/uL — ABNORMAL LOW (ref 4.22–5.81)
RDW: 12.6 % (ref 11.5–15.5)
WBC: 7.7 10*3/uL (ref 4.0–10.5)
nRBC: 0 % (ref 0.0–0.2)

## 2023-12-09 NOTE — Progress Notes (Signed)
 Mobility Specialist Progress Note:    12/09/23 1400  Mobility  Activity Ambulated with assistance in room  Level of Assistance Standby assist, set-up cues, supervision of patient - no hands on  Assistive Device Front wheel walker  Distance Ambulated (ft) 15 ft  Activity Response Tolerated well  Mobility Referral Yes  Mobility visit 1 Mobility  Mobility Specialist Start Time (ACUTE ONLY) 1342  Mobility Specialist Stop Time (ACUTE ONLY) 1355  Mobility Specialist Time Calculation (min) (ACUTE ONLY) 13 min   Pt received in BR, only requiring supervision. Declined further mobility d/t fatigue and BLE cramps. Ambulated back to bed and returned to supine. Pt left in bed with call bell and all needs met.  D'Vante Nolon Baxter Mobility Specialist Please contact via Special educational needs teacher or Rehab office at [phone]

## 2023-12-09 NOTE — TOC Progression Note (Addendum)
 Transition of Care [facility]) - Progression Note    Patient Details  Name: Donald Gordon MRN: [id number] Date of Birth: [birth year]  [facility] CM/SW Contact  Elspeth Hals, LCSW Phone Number: 12/09/2023, 10:14 AM  Clinical Narrative:   CSW received message from pt daughter [name] asking about [facility] rehab facility in [place].  CSW able to identify pt [facility] CSW: Katherleen Pancoast, [id number].  [place].  PCP: Dr Rayann Cage.  CSW left message with VA CSW.   1300: [facility].  [facility] [place] [facility] and they cannot pay for care there.    CSW spoke with [facility]: they are not able to bill medicare yet, pt would need to be private pay in order to come there for rehab.  CSW spoke with pt daughter [name], updated her on the above.  She wants to accept offer at [facility].  CSW put [place] on speakerphone, went to pt room and discussed this with him as well, he is in agreement.  Medicare payer with inpt order on 12/08/23, however pt does have [facility] banner, CSW confirmed with [facility] that pt is eligible for SNF waiver, confirmed with [facility] that they can receive pt under the waiver.    Expected Discharge Plan: Skilled Nursing Facility Barriers to Discharge: Continued Medical Work up, SNF Pending bed offer  Expected Discharge Plan and Services In-house Referral: Clinical Social Work   Post Acute Care Choice: Skilled Nursing Facility Living arrangements for the past 2 months: Single Family Home                                       Social Determinants of Health (SDOH) Interventions SDOH Screenings   Food Insecurity: No Food Insecurity (12/07/2023)  Housing: Low Risk  (12/07/2023)  Transportation Needs: No Transportation Needs (12/07/2023)  Utilities: Not At Risk (12/07/2023)  Social Connections: Moderately Isolated (12/07/2023)  Stress: No Stress Concern Present (10/16/2022)   Received  from [facility]  Tobacco Use: Medium Risk (12/07/2023)    Readmission Risk Interventions    11/24/2023   11:16 AM  Readmission Risk Prevention Plan  Transportation Screening Complete  Home Care Screening Complete  Medication Review (RN CM) Complete

## 2023-12-09 NOTE — Plan of Care (Signed)

## 2023-12-09 NOTE — Progress Notes (Addendum)
 [facility] PROGRESS NOTE    Progress Note  Donald Gordon  FMW:[id number] DOB: 1940-11-17 DOA: 12/06/2023 [facility]CP: [facility]     Brief Narrative:   Donald Gordon is an 83 y.o. male past medical history significant for diabetes mellitus type 2, essential hypertension hyperlipidemia history of an MI status post stent, history of arrhythmias, PAD status post stent on 11/20/2023 during this time PT evaluated the patient recommended skilled but he chose to go home lives alone, paroxysmal atrial fibrillation on Eliquis , history of CVA who came into the ED complaining of diarrhea and loss of appetite with generalized weakness, abdominal Donald Gordon prior to admission he felt lightheaded and sustained a fall, CT of the head showed 1.2 cm small acute posttraumatic subarachnoid hemorrhage, neurosurgery was consulted recommended to repeat a CT in 6 hours, repeated CT showed no evidence of intracranial hemorrhage some laminar necrosis   Assessment/Plan:   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  No convincing hemorrhage. Neurosurgery was curb sided recommended no intervention. Eliquis  and Plavix  were resumed given recent vascular intervention. Physical therapy evaluated the patient, will need skilled nursing facility placement.  Diarrhea: CT scan of the abdomen pelvis showed no acute findings Has remained afebrile with no leukocytosis. Now resolved.  Likely viral in nature. KVO IV fluids.  Hypokalemia:  Repleted now improved.  Chronic kidney stage III a: Creatinine clearance to be baseline.  Normocytic anemia: Continue to monitor.  Appears to be stable.  Essential hypertension: Continue metoprolol  and amlodipine . Hold ACE inhibitor and hydrochlorothiazide .  Paroxysmal atrial fibrillation: Continue Eliquis  and metoprolol .  History of CAD/PAD with recent stent  bypass/hyperlipidemia: Continue Eliquis  and Plavix . Continue Eliquis .  History of CVA/dementia: Continue Namenda  and Exelon .  BPH: Continue finasteride .  GERD: Continue PPI.  Left shoulder pain: Imaging showed no acute findings continue Tylenol .   DVT prophylaxis: Eliquis  Family Communication: Daughter Status is: Inpatient Remains inpatient appropriate because: Mechanical fall    Code Status:     Code Status Orders  (From admission, onward)           Start     Ordered   12/07/23 0253  Full code  Continuous       Question:  By:  Answer:  Consent: discussion documented in EHR   12/07/23 0253           Code Status History     Date Active Date Inactive Code Status Order ID Comments User Context   11/20/2023 1425 11/24/2023 1724 Full Code [id number]  Elna Ahmed SQUIBB, PA-C Inpatient   10/30/2023 1246 10/30/2023 2030 Full Code [id number]  Magda Donald SAILOR, MD Inpatient   09/10/2022 1752 09/13/2022 1729 Full Code [id number]  Tobie Yetta HERO, MD Inpatient   12/07/2020 1325 12/07/2020 2134 Full Code [id number]  Inocencio Soyla Lunger, MD Inpatient   06/03/2020 1450 06/12/2020 1652 Full Code [id number]  Magda Donald SAILOR, MD Inpatient   05/18/2020 2102 05/21/2020 2104 Full Code [id number]  Kenard Zachary PARAS, MD Inpatient   01/18/2020 1241 01/21/2020 2139 Full Code [id number]  Rosendo Nena PARAS, PA-C ED   08/30/2019 2102 09/02/2019 1754 Full Code [id number]  Serene Gaile ORN, MD ED   07/23/2019 0800 07/26/2019 1733 Full Code [id number]  Sheree Penne Bruckner, MD Inpatient   03/17/2018 1817 03/19/2018 1837 Full Code [id number]  Wonda Sharper, MD Inpatient   01/09/2018 2346 01/14/2018 1629 Full Code [id number]  Laurence Redell CROME, MD Inpatient  07/02/2014 2209 07/04/2014 1959 Full Code [id number]  Franky Redia SAILOR, MD ED         IV Access:   Peripheral IV   Procedures and diagnostic studies:   CT CERVICAL SPINE WO CONTRAST Result Date: 12/08/2023 CLINICAL DATA:  Neck pain after fall EXAM:  CT CERVICAL SPINE WITHOUT CONTRAST TECHNIQUE: Multidetector CT imaging of the cervical spine was performed without intravenous contrast. Multiplanar CT image reconstructions were also generated. RADIATION DOSE REDUCTION: This exam was performed according to the departmental dose-optimization program which includes automated exposure control, adjustment of the mA and/or kV according to patient size and/or use of iterative reconstruction technique. COMPARISON:  None Available. FINDINGS: Alignment: Mild degenerative anterolisthesis of C3 on C4 and C4 on C5. Otherwise alignment is anatomic. Skull base and vertebrae: No acute fracture. No primary bone lesion or focal pathologic process. Soft tissues and spinal canal: No prevertebral fluid or swelling. No visible canal hematoma. Disc levels: Multilevel facet hypertrophic changes are greatest from C2-3 through C4-5. Moderate spondylosis at the C5-6 and C6-7 levels. Prominent hypertrophic changes at the C1-C2 interface. Upper chest: Airway is patent. Emphysematous changes are seen at the lung apices. Other: Reconstructed images demonstrate no additional findings. IMPRESSION: 1. No evidence of cervical spine fracture. 2. Multilevel cervical degenerative changes as above. 3. Emphysema. Electronically Signed   By: Donald Gordon M.D.   On: 12/08/2023 18:53     Medical Consultants:   None.   Subjective:    Donald Gordon no complaints.  Objective:    Vitals:   12/08/23 1351 12/08/23 2035 12/09/23 0457 12/09/23 0826  BP: (!) 135/59 (!) 152/58 (!) 147/58 95/67  Pulse: 77 80 84 92  Resp: 17 15 14 16   Temp: (!) 97.5 F (36.4 C) (!) 97.5 F (36.4 C) (!) 97.5 F (36.4 C) (!) 97.4 F (36.3 C)  TempSrc:      SpO2: 100% 97% 94% 97%  Weight:      Height:       SpO2: 97 %   Intake/Output Summary (Last 24 hours) at 12/09/2023 1048 Last data filed at 12/09/2023 1000 Gross per 24 hour  Intake 597 ml  Output 200 ml  Net 397 ml   Filed Weights   12/07/23  0905  Weight: 75.2 kg    Exam: General exam: In no acute distress. Respiratory system: Good air movement and clear to auscultation. Cardiovascular system: S1 & S2 heard, RRR. No JVD. Gastrointestinal system: Abdomen is nondistended, soft and nontender.  Extremities: No pedal edema. Skin: No rashes, lesions or ulcers Psychiatry: Judgement and insight appear normal. Mood & affect appropriate.    Data Reviewed:    Labs: Basic Metabolic Panel: Recent Labs  Lab 12/06/23 2349 12/07/23 0500 12/08/23 0635 12/09/23 0603  NA 137 134* 136 134*  K 2.7* 3.3* 3.0* 3.8  CL 95* 99 102 102  CO2 26 24 24 23   GLUCOSE 153* 120* 101* 108*  BUN 21 18 20 15   CREATININE 1.49* 1.25* 1.18 1.17  CALCIUM  7.4* 6.3* 6.2* 6.9*  MG 1.8  --   --   --    GFR Estimated Creatinine Clearance: 47.8 mL/min (by C-G formula based on SCr of 1.17 mg/dL). Liver Function Tests: Recent Labs  Lab 12/06/23 2349  AST 15  ALT 9  ALKPHOS 71  BILITOT 1.3*  PROT 7.1  ALBUMIN  3.0*   No results for input(s): LIPASE, AMYLASE in the last 168 hours. No results for input(s): AMMONIA in the last 168 hours.  Coagulation profile Recent Labs  Lab 12/06/23 2349  INR 1.8*   COVID-19 Labs  No results for input(s): DDIMER, FERRITIN, LDH, CRP in the last 72 hours.  Lab Results  Component Value Date   SARSCOV2NAA NEGATIVE 06/11/2020   SARSCOV2NAA NEGATIVE 06/02/2020   SARSCOV2NAA NEGATIVE 05/18/2020   SARSCOV2NAA NEGATIVE 01/18/2020    CBC: Recent Labs  Lab 12/06/23 2349 12/07/23 0500 12/08/23 0635 12/09/23 0603  WBC 18.5* 15.6* 11.1* 7.7  NEUTROABS 14.8*  --   --   --   HGB 12.1* 9.8* 9.0* 9.4*  HCT 37.0* 29.4* 27.8* 29.0*  MCV 94.4 93.9 94.2 92.7  PLT 289 258 217 232   Cardiac Enzymes: Recent Labs  Lab 12/06/23 2349  CKTOTAL 144   BNP (last 3 results) No results for input(s): PROBNP in the last 8760 hours. CBG: No results for input(s): GLUCAP in the last 168  hours. D-Dimer: No results for input(s): DDIMER in the last 72 hours. Hgb A1c: No results for input(s): HGBA1C in the last 72 hours. Lipid Profile: No results for input(s): CHOL, HDL, LDLCALC, TRIG, CHOLHDL, LDLDIRECT in the last 72 hours. Thyroid  function studies: No results for input(s): TSH, T4TOTAL, T3FREE, THYROIDAB in the last 72 hours.  Invalid input(s): FREET3 Anemia work up: No results for input(s): VITAMINB12, FOLATE, FERRITIN, TIBC, IRON, RETICCTPCT in the last 72 hours. Sepsis Labs: Recent Labs  Lab 12/06/23 2349 12/06/23 2358 12/07/23 0500 12/08/23 0635 12/09/23 0603  WBC 18.5*  --  15.6* 11.1* 7.7  LATICACIDVEN  --  1.6  --   --   --    Microbiology Recent Results (from the past 240 hours)  Blood Culture (routine x 2)     Status: None (Preliminary result)   Collection Time: 12/06/23 11:13 PM   Specimen: BLOOD  Result Value Ref Range Status   Specimen Description BLOOD LEFT ANTECUBITAL  Final   Special Requests   Final    BOTTLES DRAWN AEROBIC AND ANAEROBIC Blood Culture adequate volume   Culture   Final    NO GROWTH 2 DAYS Performed at [facility], [address], [place], [place] [address]    Report Status PENDING  Incomplete  Blood Culture (routine x 2)     Status: None (Preliminary result)   Collection Time: 12/07/23  8:52 AM   Specimen: BLOOD  Result Value Ref Range Status   Specimen Description BLOOD LEFT ANTECUBITAL  Final   Special Requests   Final    BOTTLES DRAWN AEROBIC AND ANAEROBIC Blood Culture results may not be optimal due to an inadequate volume of blood received in culture bottles   Culture   Final    NO GROWTH 2 DAYS Performed at [facility], [address] [place], [place] [address]    Report Status PENDING  Incomplete     Medications:    amLODipine   5 mg Oral Daily   apixaban   5 mg Oral BID   clopidogrel   75 mg Oral Daily   ezetimibe   10 mg Oral QPM   ferrous sulfate   325  mg Oral Q48H   finasteride   5 mg Oral Daily   memantine   20 mg Oral QHS   metoprolol  tartrate  50 mg Oral BID   pantoprazole   40 mg Oral QAC breakfast   rivastigmine   6 mg Oral BID   rosuvastatin   10 mg Oral Daily   sodium chloride  flush  3 mL Intravenous Q12H   Continuous Infusions:    LOS:  1 day   Erle Odell Castor  [facility]  12/09/2023, 10:48 AM

## 2023-12-09 NOTE — Plan of Care (Signed)

## 2023-12-09 NOTE — Progress Notes (Signed)
 Physical Therapy Treatment Patient Details Name: Donald Gordon MRN: [id number] DOB: 1940/08/03 Today's Date: 12/09/2023   History of Present Illness 83 yo male presents to [facility] 6/15 with diarrhea, poor appetite, weakness, fall. CT abdomen pelvis showed left colonic diverticulosis without evidence of diverticulitis. 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 Repeat CT 6/16 shows lesion area is more compatible with laminar necrosis and NOT SAH. Recent admission with RLE fempop bypass 5/30, PT recommending SNF at that time and pt went home instead. PMH includes dementia, DM2, HTN, HLD, OSA, CAD/MI/stent, arrhythmia, PAD s/p stent/bypass, paroxysmal A-fib on Eliquis , stroke, GERD, COPD, arthritis.    PT Comments  Patient resting in bed and c/o Rt neck stiffness and pain but agreeable to mobilize with therapy. Encouraged gentle AROM for neck stiffness. Patient required extra time and mod assist for supine>sit with reliance on therapist to assist for turn and to raise trunk. Pt able to scoot to EOB and with cues for hand placement power up to RW. VSS and orthostatics monitored (see below). Pt amb hallway distance today with no standing or seated rest break needed, pace slow and cautious, cues needed for proximity to RW and posture. Pt with trunk flexion and slightly flexed towards Rt. EOS pt agreeable to remain OOB in recliner. Alarm on and call bell within reach. Will continue to progress pt as able during acute stay. Patient will benefit from continued inpatient follow up therapy, <3 hours/day.    Orthostatic VS for the past 24 hrs:  BP- Lying Pulse- Lying BP- Sitting Pulse- Sitting BP- Standing at 0 minutes Pulse- Standing at 0 minutes BP- Standing at 0 minutes Pulse- Standing at 0 minutes  12/09/23 0956 179/70 89 148/83 102 145/73 104 153/84 115       If plan is discharge home, recommend the following: A little help with bathing/dressing/bathroom;Assistance  with cooking/housework;Assist for transportation;Help with stairs or ramp for entrance   Can travel by private vehicle     No  Equipment Recommendations  None recommended by PT    Recommendations for Other Services       Precautions / Restrictions Precautions Precautions: Fall Recall of Precautions/Restrictions: Intact Precaution/Restrictions Comments: R groin and R lower leg medial incision, s/p fempop bypass 5/30 Restrictions Weight Bearing Restrictions Per Provider Order: No     Mobility  Bed Mobility Overal bed mobility: Needs Assistance Bed Mobility: Supine to Sit     Supine to sit: Mod assist     General bed mobility comments: Mod A to lift trunk in bed, scoot hips to EOB. pt reported limitations due to R sided neck pain    Transfers Overall transfer level: Needs assistance Equipment used: Rolling walker (2 wheels) Transfers: Sit to/from Stand Sit to Stand: Contact guard assist           General transfer comment: CGA for rise from EOB, cues for hand placement.    Ambulation/Gait Ambulation/Gait assistance: Contact guard assist Gait Distance (Feet): 80 Feet Assistive device: Rolling walker (2 wheels) Gait Pattern/deviations: Trunk flexed, Wide base of support, Step-through pattern, Decreased stride length Gait velocity: decr     General Gait Details: CGA for safety with cues for position to RW, pt with flexed trunk. no standing breaks and no leaning on RW today, HR in 110's wit mobility.   Stairs             Wheelchair Mobility     Tilt Bed    Modified  Rankin (Stroke Patients Only)       Balance Overall balance assessment: Needs assistance Sitting-balance support: Feet supported Sitting balance-Leahy Scale: Good     Standing balance support: During functional activity, Bilateral upper extremity supported, Reliant on assistive device for balance Standing balance-Leahy Scale: Poor                               Communication Communication Communication: No apparent difficulties  Cognition Arousal: Alert Behavior During Therapy: WFL for tasks assessed/performed   PT - Cognitive impairments: No apparent impairments, History of cognitive impairments                         Following commands: Intact      Cueing Cueing Techniques: Verbal cues  Exercises      General Comments        Pertinent Vitals/Pain Pain Assessment Pain Assessment: Faces Faces Pain Scale: Hurts even more Pain Location: R side of neck, back Pain Descriptors / Indicators: Discomfort Pain Intervention(s): Limited activity within patient's tolerance, Monitored during session, Repositioned    Home Living                          Prior Function            PT Goals (current goals can now be found in the care plan section) Acute Rehab PT Goals PT Goal Formulation: With patient Time For Goal Achievement: 12/21/23 Potential to Achieve Goals: Fair Progress towards PT goals: Progressing toward goals    Frequency    Min 2X/week      PT Plan      Co-evaluation              AM-PAC PT 6 Clicks Mobility   Outcome Measure  Help needed turning from your back to your side while in a flat bed without using bedrails?: A Little Help needed moving from lying on your back to sitting on the side of a flat bed without using bedrails?: A Lot Help needed moving to and from a bed to a chair (including a wheelchair)?: A Little Help needed standing up from a chair using your arms (e.g., wheelchair or bedside chair)?: A Little Help needed to walk in hospital room?: A Little Help needed climbing 3-5 steps with a railing? : A Lot 6 Click Score: 16    End of Session Equipment Utilized During Treatment: Gait belt Activity Tolerance: Patient tolerated treatment well Patient left: with call bell/phone within reach;in chair;with chair alarm set;with nursing/sitter in room Nurse Communication:  Mobility status PT Visit Diagnosis: Muscle weakness (generalized) (M62.81);Pain Pain - Right/Left: Right Pain - part of body:  (neck)     Time: 4540-9811 PT Time Calculation (min) (ACUTE ONLY): 31 min  Charges:    $Gait Training: 8-22 mins $Therapeutic Activity: 8-22 mins PT General Charges $$ ACUTE PT VISIT: 1 Visit                     Tish Forge, DPT Acute Rehabilitation Services Office [phone]  12/09/23 10:34 AM

## 2023-12-10 DIAGNOSIS — I2510 Atherosclerotic heart disease of native coronary artery without angina pectoris: Secondary | ICD-10-CM | POA: Diagnosis not present

## 2023-12-10 DIAGNOSIS — Z95828 Presence of other vascular implants and grafts: Secondary | ICD-10-CM | POA: Diagnosis not present

## 2023-12-10 DIAGNOSIS — E782 Mixed hyperlipidemia: Secondary | ICD-10-CM | POA: Diagnosis not present

## 2023-12-10 DIAGNOSIS — K219 Gastro-esophageal reflux disease without esophagitis: Secondary | ICD-10-CM | POA: Diagnosis not present

## 2023-12-10 DIAGNOSIS — N1831 Chronic kidney disease, stage 3a: Secondary | ICD-10-CM | POA: Diagnosis not present

## 2023-12-10 DIAGNOSIS — R627 Adult failure to thrive: Secondary | ICD-10-CM

## 2023-12-10 DIAGNOSIS — W19XXXD Unspecified fall, subsequent encounter: Secondary | ICD-10-CM | POA: Diagnosis not present

## 2023-12-10 DIAGNOSIS — F0390 Unspecified dementia without behavioral disturbance: Secondary | ICD-10-CM | POA: Diagnosis not present

## 2023-12-10 DIAGNOSIS — I25119 Atherosclerotic heart disease of native coronary artery with unspecified angina pectoris: Secondary | ICD-10-CM | POA: Diagnosis not present

## 2023-12-10 DIAGNOSIS — E1169 Type 2 diabetes mellitus with other specified complication: Secondary | ICD-10-CM | POA: Diagnosis not present

## 2023-12-10 DIAGNOSIS — C3431 Malignant neoplasm of lower lobe, right bronchus or lung: Secondary | ICD-10-CM | POA: Diagnosis not present

## 2023-12-10 DIAGNOSIS — R2681 Unsteadiness on feet: Secondary | ICD-10-CM | POA: Diagnosis not present

## 2023-12-10 DIAGNOSIS — E876 Hypokalemia: Secondary | ICD-10-CM | POA: Diagnosis not present

## 2023-12-10 DIAGNOSIS — I480 Paroxysmal atrial fibrillation: Secondary | ICD-10-CM | POA: Diagnosis not present

## 2023-12-10 DIAGNOSIS — E1122 Type 2 diabetes mellitus with diabetic chronic kidney disease: Secondary | ICD-10-CM | POA: Diagnosis not present

## 2023-12-10 DIAGNOSIS — R197 Diarrhea, unspecified: Secondary | ICD-10-CM | POA: Diagnosis not present

## 2023-12-10 DIAGNOSIS — M810 Age-related osteoporosis without current pathological fracture: Secondary | ICD-10-CM | POA: Diagnosis not present

## 2023-12-10 DIAGNOSIS — I739 Peripheral vascular disease, unspecified: Secondary | ICD-10-CM | POA: Diagnosis not present

## 2023-12-10 DIAGNOSIS — R262 Difficulty in walking, not elsewhere classified: Secondary | ICD-10-CM | POA: Diagnosis not present

## 2023-12-10 DIAGNOSIS — I70221 Atherosclerosis of native arteries of extremities with rest pain, right leg: Secondary | ICD-10-CM | POA: Diagnosis not present

## 2023-12-10 DIAGNOSIS — I609 Nontraumatic subarachnoid hemorrhage, unspecified: Secondary | ICD-10-CM | POA: Diagnosis not present

## 2023-12-10 DIAGNOSIS — I5032 Chronic diastolic (congestive) heart failure: Secondary | ICD-10-CM | POA: Diagnosis not present

## 2023-12-10 DIAGNOSIS — M6281 Muscle weakness (generalized): Secondary | ICD-10-CM | POA: Diagnosis not present

## 2023-12-10 DIAGNOSIS — I10 Essential (primary) hypertension: Secondary | ICD-10-CM | POA: Diagnosis not present

## 2023-12-10 MED ORDER — TRAMADOL HCL 50 MG PO TABS
50.0000 mg | ORAL_TABLET | Freq: Once | ORAL | Status: AC
  Administered 2023-12-10: 50 mg via ORAL
  Filled 2023-12-10: qty 1

## 2023-12-10 MED ORDER — TRAMADOL HCL 50 MG PO TABS: 50.0000 mg | ORAL_TABLET | Freq: Four times a day (QID) | ORAL | 0 refills | Status: AC | PRN

## 2023-12-10 NOTE — TOC Transition Note (Addendum)
 Transition of Care [facility]) - Discharge Note   Patient Details  Name: Donald Gordon MRN: [id number] Date of Birth: 01-17-1941  [facility]) CM/SW Contact:  Elspeth Hals, LCSW Phone Number: 12/10/2023, 10:40 AM   Clinical Narrative:   Pt discharging to [facility].  RN call report to [phone].  PTAR called 1040 and scheduled for noon pickup.   0930: CSW confirmed with [facility] that they can receive pt today.  They are requesting pickup schedule for noon.    1100: some confusion of which [facility] pt will DC to.  CSW spoke with pt and daughter [name].  Pt wants [facility] [place], daughter OK with this.  CSW confirmed with [facility] and they are expecting pt at [place].  PTAR has been updated.  Final next level of care: Skilled Nursing Facility Barriers to Discharge: Barriers Resolved   Patient Goals and CMS Choice     Choice offered to / list presented to : Adult Children (daughter [name])      Discharge Placement              Patient chooses bed at: [facility], [place] Patient to be transferred to facility by: ptar Name of family member notified: message sent to daughter [name] Patient and family notified of of transfer: 12/10/23  Discharge Plan and Services Additional resources added to the After Visit Summary for   In-house Referral: Clinical Social Work   Post Acute Care Choice: Skilled Nursing Facility                               Social Drivers of Health (SDOH) Interventions SDOH Screenings   Food Insecurity: No Food Insecurity (12/07/2023)  Housing: Low Risk  (12/07/2023)  Transportation Needs: No Transportation Needs (12/07/2023)  Utilities: Not At Risk (12/07/2023)  Social Connections: Moderately Isolated (12/07/2023)  Stress: No Stress Concern Present (10/16/2022)   Received from [facility]  Tobacco Use: Medium Risk (12/07/2023)     Readmission Risk Interventions    11/24/2023   11:16 AM  Readmission Risk  Prevention Plan  Transportation Screening Complete  Home Care Screening Complete  Medication Review (RN CM) Complete

## 2023-12-10 NOTE — Progress Notes (Signed)
 AVS completed for discharge packet and placed with chart.

## 2023-12-10 NOTE — Progress Notes (Signed)
 Mobility Specialist Progress Note:    12/10/23 1042  Mobility  Activity Refused mobility   Pt refused mobility d/t fear of falling. Despite max encouragement pt refused/ Will f/u as able.    Doak Free Mobility Specialist  Please contact via [employer] or  Rehab Office [phone]

## 2023-12-10 NOTE — Progress Notes (Signed)
 Report given to [facility] LPN at [facility], [place]. PTAR here for transport.

## 2023-12-10 NOTE — Progress Notes (Signed)
 Notified MD of patients severe neck pain. Tylenol  and Robaxin were admin earlier per MD. Hot pack placed patient was also repositioned without any relief.

## 2023-12-10 NOTE — Plan of Care (Signed)
  Problem: Education: Goal: Knowledge of General Education information will improve Description: Including pain rating scale, medication(s)/side effects and non-pharmacologic comfort measures Outcome: Progressing   Problem: Activity: Goal: Risk for activity intolerance will decrease Outcome: Progressing   Problem: Safety: Goal: Ability to remain free from injury will improve Outcome: Progressing   Problem: Pain Managment: Goal: General experience of comfort will improve and/or be controlled Outcome: Progressing

## 2023-12-10 NOTE — Discharge Summary (Addendum)
 Physician Discharge Summary  Donald Gordon:[id number] DOB: 03-04-1941 DOA: 12/06/2023  PCP: [facility]  Admit date: 12/06/2023 Discharge date: 12/10/2023  Admitted From: Home Disposition:  SNF  Recommendations for Outpatient Follow-up:  Follow up with PCP in 1-2 weeks Please obtain BMP/CBC in one week   Home Health:No Equipment/Devices:None  Discharge Condition:Stable CODE STATUS:Full Diet recommendation: Heart Healthy   Brief/Interim Summary: 83 y.o. male past medical history significant for diabetes mellitus type 2, essential hypertension hyperlipidemia history of an MI status post stent, history of arrhythmias, PAD status post stent on 11/20/2023 during this time PT evaluated the patient recommended skilled but he choose to go home lives alone, paroxysmal atrial fibrillation on Eliquis , history of CVA who came into the ED complaining of diarrhea and loss of appetite with generalized weakness, abdominal Naitik prior to admission he felt lightheaded and sustained a fall, CT of the head showed 1.2 cm small acute posttraumatic subarachnoid hemorrhage, neurosurgery was consulted recommended to repeat a CT in 6 hours, repeated CT showed no evidence of intracranial hemorrhage some laminar necrosis   Discharge Diagnoses:  Possible lateral necrosis   Chronic diastolic (congestive) heart failure (HCC)   Type 2 diabetes mellitus with stage 3a chronic kidney disease, without long-term current use of insulin  (HCC)   Atrial fibrillation (HCC)   PAD (peripheral artery disease) (HCC)   Chronic kidney disease, stage 3a (HCC)   Diarrhea   Dementia (HCC)  Possible left anterior frontal lobe laminar necrosis: Neurosurgery was consulted as CT scan of the head show a small acute posttraumatic subdural hemorrhage. Recommended to repeat a CT scan in a few hours it was repeated and it showed no convincing evidence of subarachnoid hemorrhage, but it did show a laminar necrosis from the left  anterior lobe Neurosurgery related there is no intervention to continue conservative management. He was okay to resume Eliquis  and Plavix  given recent vascular intervention. Physical therapy evaluated the patient and recommended skilled nursing facility.  Diarrhea: CT scan of the abdomen pelvis showed no acute findings he remained afebrile and no leukocytosis is resolved. Question viral in nature.  Hypokalemia: Repleted now resolved.  Chronic kidney disease stage IIIa: His creatinine has remained at baseline.  Normocytic anemia: His hemoglobin appears to be stable.  Essential hypertension: Continue metoprolol , amlodipine  and hydrochlorothiazide . Can resume ACE inhibitor once blood pressure continues to rise.  Paroxysmal atrial fibrillation: Continue Eliquis  metoprolol .  History of CAD/PAD with recent stent bypass/hyperlipidemia: Plavix  and Eliquis  was held due to the concern of subarachnoid hemorrhage. Per neurosurgery recommended to repeat the CT scan we did not show the subdural. They agreed to resume Eliquis  and Plavix .  History of CVA/mild dementia: Continue Namenda  and Exelon  .  BPH: Continue finasteride .  GERD: Continue PPI.  Left shoulder pain: Imaging showed no acute findings Tylenol  helped with the pain.  Discharge Instructions  Discharge Instructions     Diet - low sodium heart healthy   Complete by: As directed    Increase activity slowly   Complete by: As directed       Allergies as of 12/10/2023       Reactions   Oxycodone  Nausea And Vomiting   Statins Other (See Comments)   Muscle and Bone pain   Amiodarone     Caused issues and was stopped by MD- possible was the cause for the patient's heart going in and out of rhythm multiple times   Donepezil Other (See Comments)   Per [facility] Insomnia   Imipramine Other (See  Comments)   Per [facility]   Oxybutynin Chloride Swelling, Other (See Comments)   Per [facility]        Medication List     PAUSE  taking these medications    ramipril  10 MG capsule Wait to take this until: December 15, 2023 Commonly known as: ALTACE  Take 10 mg by mouth 2 (two) times daily.       STOP taking these medications    albuterol  108 (90 Base) MCG/ACT inhaler Commonly known as: VENTOLIN  HFA   calcitonin (salmon) 200 UNIT/ACT nasal spray Commonly known as: MIACALCIN/FORTICAL       TAKE these medications    acetaminophen  500 MG tablet Commonly known as: TYLENOL  Take 1,000 mg by mouth daily as needed for moderate pain (pain score 4-6), headache or mild pain (pain score 1-3).   amLODipine  10 MG tablet Commonly known as: NORVASC  Take 10 mg by mouth daily.   apixaban  5 MG Tabs tablet Commonly known as: Eliquis  Take 1 tablet (5 mg total) by mouth 2 (two) times daily.   clopidogrel  75 MG tablet Commonly known as: PLAVIX  Take 1 tablet by mouth daily.   ferrous sulfate  325 (65 FE) MG tablet Take 325 mg by mouth daily.   finasteride  5 MG tablet Commonly known as: PROSCAR  Take 5 mg by mouth daily.   hydrochlorothiazide  12.5 MG capsule Commonly known as: MICROZIDE  Take 12.5 mg by mouth daily.   lidocaine  5 % Commonly known as: LIDODERM  Place 1 patch onto the skin daily as needed (pain). Remove & Discard patch within 12 hours or as directed by MD   metoprolol  tartrate 50 MG tablet Commonly known as: LOPRESSOR  Take 50 mg by mouth 2 (two) times daily.   nitroGLYCERIN  0.4 MG SL tablet Commonly known as: NITROSTAT  Place 0.4 mg under the tongue every 5 (five) minutes x 3 doses as needed for chest pain.   pantoprazole  40 MG tablet Commonly known as: PROTONIX  Take 40 mg by mouth daily before breakfast.   rivastigmine  6 MG capsule Commonly known as: EXELON  Take 6 mg by mouth 2 (two) times daily.   rosuvastatin  10 MG tablet Commonly known as: CRESTOR  Take 1 tablet (10 mg total) by mouth daily.   Tiotropium Bromide-Olodaterol 2.5-2.5 MCG/ACT Aers Inhale 2 puffs into the lungs daily.    traMADol  50 MG tablet Commonly known as: ULTRAM  Take 1 tablet (50 mg total) by mouth every 6 (six) hours as needed for moderate pain (pain score 4-6). What changed:  when to take this reasons to take this   urea  10 % cream Commonly known as: CARMOL Apply 1 Application topically 2 (two) times daily.   vitamin B-12 500 MCG tablet Commonly known as: CYANOCOBALAMIN  Take 500 mcg by mouth daily.   Vitamin D  50 MCG (2000 UT) tablet Take 2,000 Units by mouth daily.        Allergies  Allergen Reactions   Oxycodone  Nausea And Vomiting   Statins Other (See Comments)    Muscle and Bone pain    Amiodarone      Caused issues and was stopped by MD- possible was the cause for the patient's heart going in and out of rhythm multiple times   Donepezil Other (See Comments)    Per [facility] Insomnia   Imipramine Other (See Comments)    Per [facility]   Oxybutynin Chloride Swelling and Other (See Comments)    Per [facility]    Consultations: Neurosurgery Dr. Michale Age   Procedures/Studies: CT CERVICAL SPINE WO CONTRAST Result Date: 12/08/2023  CLINICAL DATA:  Neck pain after fall EXAM: CT CERVICAL SPINE WITHOUT CONTRAST TECHNIQUE: Multidetector CT imaging of the cervical spine was performed without intravenous contrast. Multiplanar CT image reconstructions were also generated. RADIATION DOSE REDUCTION: This exam was performed according to the departmental dose-optimization program which includes automated exposure control, adjustment of the mA and/or kV according to patient size and/or use of iterative reconstruction technique. COMPARISON:  None Available. FINDINGS: Alignment: Mild degenerative anterolisthesis of C3 on C4 and C4 on C5. Otherwise alignment is anatomic. Skull base and vertebrae: No acute fracture. No primary bone lesion or focal pathologic process. Soft tissues and spinal canal: No prevertebral fluid or swelling. No visible canal hematoma. Disc levels: Multilevel facet hypertrophic changes are  greatest from C2-3 through C4-5. Moderate spondylosis at the C5-6 and C6-7 levels. Prominent hypertrophic changes at the C1-C2 interface. Upper chest: Airway is patent. Emphysematous changes are seen at the lung apices. Other: Reconstructed images demonstrate no additional findings. IMPRESSION: 1. No evidence of cervical spine fracture. 2. Multilevel cervical degenerative changes as above. 3. Emphysema. Electronically Signed   By: Bobbye Burrow M.D.   On: 12/08/2023 18:53   CT HEAD WO CONTRAST ( ) Result Date: 12/07/2023 CLINICAL DATA:  Head trauma, minor (Age >= 65y) EXAM: CT HEAD WITHOUT CONTRAST TECHNIQUE: Contiguous axial images were obtained from the base of the skull through the vertex without intravenous contrast. RADIATION DOSE REDUCTION: This exam was performed according to the departmental dose-optimization program which includes automated exposure control, adjustment of the mA and/or kV according to patient size and/or use of iterative reconstruction technique. COMPARISON:  CT of the head dated December 07, 2023. FINDINGS: Brain: There is moderate generalized cerebral and cerebellar volume loss present. There are chronic encephalomalacia changes in again demonstrated involving the left occipital and temporal lobes ex vacuo dilatation of the left lateral ventricle. Curvilinear radiopacities are again demonstrated in the left anterior frontal lobe, which should. It clearly be intraparenchymal involving the cortex, compatible with laminar necrosis/mineralization. There is no convincing evidence of subarachnoid hemorrhage. Vascular: Extensive calcifications involving the carotid siphons and vertebrobasilar system. Skull: Intact and unremarkable. Sinuses/Orbits: Mild mucosal disease within the right maxillary sinus. The visualized paranasal sinuses and mastoid air cells are otherwise clear. The patient is status post bilateral lens replacement. Other: None.  No significant soft tissue swelling is evident.  IMPRESSION: 1. The curvilinear radiopacity noted within the left anterior frontal lobe appears to clearly be within the cortex and is compatible with laminar necrosis. There is no convincing evidence of intracranial hemorrhage. 2. Chronic encephalomalacia changes within the left occipital and temporal lobes. Electronically Signed   By: Maribeth Shivers M.D.   On: 12/07/2023 08:17   CT HEAD WO CONTRAST ( ) Result Date: 12/07/2023 CLINICAL DATA:  Initial evaluation for acute blood polytrauma. EXAM: CT HEAD WITHOUT CONTRAST TECHNIQUE: Contiguous axial images were obtained from the base of the skull through the vertex without intravenous contrast. RADIATION DOSE REDUCTION: This exam was performed according to the departmental dose-optimization program which includes automated exposure control, adjustment of the mA and/or kV according to patient size and/or use of iterative reconstruction technique. COMPARISON:  None Available. FINDINGS: Brain: Generalized age-related cerebral atrophy with moderate chronic microvascular ischemic disease. Chronic left PCA distribution infarct. Associated ex vacuo dilatation of the left lateral ventricle. Small remote left cerebellar infarct. In the high left frontal lobe, there is a 1.2 cm linear hyperdensity (series 3, image 13). While this could reflect a small amount of acute posttraumatic subarachnoid hemorrhage,  this appears to be parenchymal in location, and could reflect a small focus of mineralization and/or laminar necrosis. No significant surrounding edema. No other acute intracranial hemorrhage. No acute large vessel territory infarct. No mass lesion or midline shift. Ventricular prominence related global parenchymal volume loss of hydrocephalus. No extra-axial fluid collection. Vascular: No abnormal hyperdense vessel. Calcified atherosclerosis present at skull base. Skull: No appreciable scalp soft tissue abnormality. Calvarium intact. Sinuses/Orbits: Globes orbital soft  tissues demonstrate no acute finding. Mild mucosal thickening about the ethmoidal air cells and right maxillary sinus. Paranasal sinuses are otherwise clear. No mastoid effusion. Other: None. IMPRESSION: 1. 1.2 cm linear hyperdensity involving the high left frontal lobe. While this could reflect a small amount of acute posttraumatic subarachnoid hemorrhage, this appears to be potentially be parenchymal in nature, and could reflect a small focus of mineralization and/or laminar necrosis. No significant surrounding edema. Short interval follow-up CT in 6 hours to evaluate for potential interval change is recommended. 2. No other acute intracranial abnormality. 3. Chronic left PCA distribution infarct, with additional small remote left cerebellar infarct. 4. Underlying atrophy with moderate chronic microvascular ischemic disease. Results discussed by telephone at the time of interpretation on 12/07/2023 at 2:00 am to provider Sherel Dikes , who verbally acknowledged these results. Electronically Signed   By: Virgia Griffins M.D.   On: 12/07/2023 02:12   CT ABDOMEN PELVIS W CONTRAST Result Date: 12/07/2023 EXAM: CT ABDOMEN AND PELVIS WITH CONTRAST 12/07/2023 01:18:15 AM TECHNIQUE: CT of the abdomen and pelvis was performed with the administration of intravenous contrast. Multiplanar reformatted images are provided for review. Automated exposure control, iterative reconstruction, and/or weight based adjustment of the mA/kV was utilized to reduce the radiation dose to as low as reasonably achievable. COMPARISON: PET/CT dated 08/21/2023. CLINICAL HISTORY: Abdominal pain, acute, nonlocalized. Chief complaints; Weakness; Diarrhea; CT HEAD WO CONTRAST ( ); Polytrauma, blunt; CT ABDOMEN PELVIS W CONTRAST; Abdominal pain, acute, nonlocalized. FINDINGS: LOWER CHEST: No acute abnormality. LIVER: The liver is unremarkable. GALLBLADDER AND BILE DUCTS: Gallbladder is unremarkable. No biliary ductal dilatation. SPLEEN: No  acute abnormality. PANCREAS: No acute abnormality. ADRENAL GLANDS: No acute abnormality. KIDNEYS, URETERS AND BLADDER: No stones in the kidneys or ureters. No hydronephrosis. No perinephric or periureteral stranding. Urinary bladder is unremarkable. GI AND BOWEL: Stomach demonstrates no acute abnormality. There is no bowel obstruction. No bowel wall thickening. Left colonic diverticulosis without convincing inflammatory changes to suggest diverticulitis. Prior appendectomy. PERITONEUM AND RETROPERITONEUM: No ascites. No free air. VASCULATURE: Atherosclerotic calcifications of the abdominal aorta and branch vessels, although patent. Bilateral iliac stents. LYMPH NODES: No lymphadenopathy. REPRODUCTIVE ORGANS: Prostatomegaly, with enlargement of the central gland, indenting the base of the bladder. BONES AND SOFT TISSUES: Moderate degenerative changes of the visualized thoracolumbar spine. Postsurgical changes in the right groin. IMPRESSION: 1. No traumatic injury to the abdomen or pelvis. 2. Left colonic diverticulosis, without evidence of diverticulitis. 3. Additionally ancillary findings as above. Electronically signed by: Zadie Herter MD 12/07/2023 01:37 AM EDT RP Workstation: [id number]   DG Chest Port 1 View Result Date: 12/07/2023 CLINICAL DATA:  Left shoulder pain.  Questionable sepsis. EXAM: PORTABLE CHEST 1 VIEW LEFT SHOULDER ROUTINE THREE VIEWS COMPARISON:  PET-CT 08/21/2023.  No prior shoulder dedicated study. FINDINGS: Chest AP portable.  11:51 p.m.: The lungs are clear. There is mild cardiomegaly without evidence of CHF. No pleural effusion is seen. Mediastinum is normally outlined.  There is aortic atherosclerosis. Six slight thoracic dextroscoliosis with osteopenia and degenerative change. Multiple overlying telemetry leads. Left  shoulder: There is osteopenia without evidence of fractures or dislocation. There is mild osteophytosis at the [facility] joint. There is labral chondrocalcinosis. Trace  calcification at the humeral cuff insertion which may indicate calcific tendinitis or tendinopathy. Soft tissues are otherwise unremarkable. No fracture of the visualized left ribcage. IMPRESSION: 1. No evidence of acute chest disease. Mild cardiomegaly. 2. Osteopenia and degenerative change without evidence of left shoulder fractures or dislocation. 3. Labral chondrocalcinosis. 4. Trace calcification at the humeral cuff insertion which may indicate calcific tendinitis or tendinopathy. Electronically Signed   By: Denman Fischer M.D.   On: 12/07/2023 00:21   DG Shoulder Left Result Date: 12/07/2023 CLINICAL DATA:  Left shoulder pain.  Questionable sepsis. EXAM: PORTABLE CHEST 1 VIEW LEFT SHOULDER ROUTINE THREE VIEWS COMPARISON:  PET-CT 08/21/2023.  No prior shoulder dedicated study. FINDINGS: Chest AP portable.  11:51 p.m.: The lungs are clear. There is mild cardiomegaly without evidence of CHF. No pleural effusion is seen. Mediastinum is normally outlined.  There is aortic atherosclerosis. Six slight thoracic dextroscoliosis with osteopenia and degenerative change. Multiple overlying telemetry leads. Left shoulder: There is osteopenia without evidence of fractures or dislocation. There is mild osteophytosis at the [facility] joint. There is labral chondrocalcinosis. Trace calcification at the humeral cuff insertion which may indicate calcific tendinitis or tendinopathy. Soft tissues are otherwise unremarkable. No fracture of the visualized left ribcage. IMPRESSION: 1. No evidence of acute chest disease. Mild cardiomegaly. 2. Osteopenia and degenerative change without evidence of left shoulder fractures or dislocation. 3. Labral chondrocalcinosis. 4. Trace calcification at the humeral cuff insertion which may indicate calcific tendinitis or tendinopathy. Electronically Signed   By: Denman Fischer M.D.   On: 12/07/2023 00:21   HYBRID OR IMAGING ([facility] ONLY) Result Date: 11/20/2023 There is no interpretation for this exam.   This order is for images obtained during a surgical procedure.  Please See Surgeries Tab for more information regarding the procedure.    Subjective: No complaints.  Discharge Exam: Vitals:   12/09/23 2030 12/10/23 0459  BP: (!) 159/76 (!) 162/75  Pulse: 87 80  Resp:  15  Temp: 98.7 F (37.1 C) (!) 97.4 F (36.3 C)  SpO2: 98% 97%   Vitals:   12/09/23 0826 12/09/23 1403 12/09/23 2030 12/10/23 0459  BP: 95/67 125/62 (!) 159/76 (!) 162/75  Pulse: 92 84 87 80  Resp: 16 18  15   Temp: (!) 97.4 F (36.3 C) 98.1 F (36.7 C) 98.7 F (37.1 C) (!) 97.4 F (36.3 C)  TempSrc:      SpO2: 97% 98% 98% 97%  Weight:      Height:        General: Pt is alert, awake, not in acute distress Cardiovascular: RRR, S1/S2 +, no rubs, no gallops Respiratory: CTA bilaterally, no wheezing, no rhonchi Abdominal: Soft, NT, ND, bowel sounds + Extremities: no edema, no cyanosis    The results of significant diagnostics from this hospitalization (including imaging, microbiology, ancillary and laboratory) are listed below for reference.     Microbiology: Recent Results (from the past 240 hours)  Blood Culture (routine x 2)     Status: None (Preliminary result)   Collection Time: 12/06/23 11:13 PM   Specimen: BLOOD  Result Value Ref Range Status   Specimen Description BLOOD LEFT ANTECUBITAL  Final   Special Requests   Final    BOTTLES DRAWN AEROBIC AND ANAEROBIC Blood Culture adequate volume   Culture   Final    NO GROWTH 3 DAYS Performed at  [facility], [address], [place], [place] [address]    Report Status PENDING  Incomplete  Blood Culture (routine x 2)     Status: None (Preliminary result)   Collection Time: 12/07/23  8:52 AM   Specimen: BLOOD  Result Value Ref Range Status   Specimen Description BLOOD LEFT ANTECUBITAL  Final   Special Requests   Final    BOTTLES DRAWN AEROBIC AND ANAEROBIC Blood Culture results may not be optimal due to an inadequate volume of blood  received in culture bottles   Culture   Final    NO GROWTH 3 DAYS Performed at [facility], [address] [place], [place] [address]    Report Status PENDING  Incomplete     Labs: BNP (last 3 results) No results for input(s): BNP in the last 8760 hours. Basic Metabolic Panel: Recent Labs  Lab 12/06/23 2349 12/07/23 0500 12/08/23 0635 12/09/23 0603  NA 137 134* 136 134*  K 2.7* 3.3* 3.0* 3.8  CL 95* 99 102 102  CO2 26 24 24 23   GLUCOSE 153* 120* 101* 108*  BUN 21 18 20 15   CREATININE 1.49* 1.25* 1.18 1.17  CALCIUM  7.4* 6.3* 6.2* 6.9*  MG 1.8  --   --   --    Liver Function Tests: Recent Labs  Lab 12/06/23 2349  AST 15  ALT 9  ALKPHOS 71  BILITOT 1.3*  PROT 7.1  ALBUMIN  3.0*   No results for input(s): LIPASE, AMYLASE in the last 168 hours. No results for input(s): AMMONIA in the last 168 hours. CBC: Recent Labs  Lab 12/06/23 2349 12/07/23 0500 12/08/23 0635 12/09/23 0603  WBC 18.5* 15.6* 11.1* 7.7  NEUTROABS 14.8*  --   --   --   HGB 12.1* 9.8* 9.0* 9.4*  HCT 37.0* 29.4* 27.8* 29.0*  MCV 94.4 93.9 94.2 92.7  PLT 289 258 217 232   Cardiac Enzymes: Recent Labs  Lab 12/06/23 2349  CKTOTAL 144   BNP: Invalid input(s): POCBNP CBG: No results for input(s): GLUCAP in the last 168 hours. D-Dimer No results for input(s): DDIMER in the last 72 hours. Hgb A1c No results for input(s): HGBA1C in the last 72 hours. Lipid Profile No results for input(s): CHOL, HDL, LDLCALC, TRIG, CHOLHDL, LDLDIRECT in the last 72 hours. Thyroid  function studies No results for input(s): TSH, T4TOTAL, T3FREE, THYROIDAB in the last 72 hours.  Invalid input(s): FREET3 Anemia work up No results for input(s): VITAMINB12, FOLATE, FERRITIN, TIBC, IRON, RETICCTPCT in the last 72 hours. Urinalysis    Component Value Date/Time   COLORURINE YELLOW 11/11/2023 0912   APPEARANCEUR CLEAR 11/11/2023 0912   LABSPEC 1.016  11/11/2023 0912   PHURINE 5.0 11/11/2023 0912   GLUCOSEU NEGATIVE 11/11/2023 0912   HGBUR MODERATE (A) 11/11/2023 0912   BILIRUBINUR NEGATIVE 11/11/2023 0912   KETONESUR NEGATIVE 11/11/2023 0912   PROTEINUR 100 (A) 11/11/2023 0912   UROBILINOGEN 0.2 07/02/2014 1800   NITRITE NEGATIVE 11/11/2023 0912   LEUKOCYTESUR NEGATIVE 11/11/2023 0912   Sepsis Labs Recent Labs  Lab 12/06/23 2349 12/07/23 0500 12/08/23 0635 12/09/23 0603  WBC 18.5* 15.6* 11.1* 7.7   Microbiology Recent Results (from the past 240 hours)  Blood Culture (routine x 2)     Status: None (Preliminary result)   Collection Time: 12/06/23 11:13 PM   Specimen: BLOOD  Result Value Ref Range Status   Specimen Description BLOOD LEFT ANTECUBITAL  Final   Special Requests   Final  BOTTLES DRAWN AEROBIC AND ANAEROBIC Blood Culture adequate volume   Culture   Final    NO GROWTH 3 DAYS Performed at [facility], [address] [place], [place] [address]    Report Status PENDING  Incomplete  Blood Culture (routine x 2)     Status: None (Preliminary result)   Collection Time: 12/07/23  8:52 AM   Specimen: BLOOD  Result Value Ref Range Status   Specimen Description BLOOD LEFT ANTECUBITAL  Final   Special Requests   Final    BOTTLES DRAWN AEROBIC AND ANAEROBIC Blood Culture results may not be optimal due to an inadequate volume of blood received in culture bottles   Culture   Final    NO GROWTH 3 DAYS Performed at [facility], [address], [place], [place] [address]    Report Status PENDING  Incomplete     Time coordinating discharge: Over 35 minutes  SIGNED:   Macdonald Savoy, MD  [facility] 12/10/2023, 9:15 AM Pager   If 7PM-7AM, please contact night-coverage [email or web address] Password [email or web address]

## 2023-12-11 ENCOUNTER — Telehealth: Payer: Self-pay

## 2023-12-11 DIAGNOSIS — R262 Difficulty in walking, not elsewhere classified: Secondary | ICD-10-CM | POA: Diagnosis not present

## 2023-12-11 DIAGNOSIS — I739 Peripheral vascular disease, unspecified: Secondary | ICD-10-CM | POA: Diagnosis not present

## 2023-12-11 DIAGNOSIS — I2510 Atherosclerotic heart disease of native coronary artery without angina pectoris: Secondary | ICD-10-CM | POA: Diagnosis not present

## 2023-12-11 DIAGNOSIS — I5032 Chronic diastolic (congestive) heart failure: Secondary | ICD-10-CM | POA: Diagnosis not present

## 2023-12-11 NOTE — Telephone Encounter (Signed)
 1438 [facility] called [facility] triage line asking if they could remove pt's sutures.  [phone] Call back but go no answer, LM  It is not advised that [facility] remove pt's sutures and/or staples.  We will try to move pt's appointment to a sooner date provided he will have transportation.  Will await call back from [facility].

## 2023-12-12 LAB — CULTURE, BLOOD (ROUTINE X 2)
Culture: NO GROWTH
Culture: NO GROWTH
Special Requests: ADEQUATE

## 2023-12-15 ENCOUNTER — Telehealth: Payer: Self-pay

## 2023-12-15 NOTE — Progress Notes (Unsigned)
 POST OPERATIVE OFFICE NOTE    CC:  F/u for surgery  HPI:  This is a 83 y.o. male who is s/p redo redo right femoral to posterior tibial artery bypass with PTFE with balloon angioplasty of the right external iliac artery by Dr. Lanis on 11/20/2023 due to critical limb ischemia with rest pain.  Unfortunately since discharge from the hospital he was readmitted due to acute subarachnoid hemorrhage which was treated conservatively.  He has been restarted on Eliquis  and Plavix .  He denies any rest pain in the R foot.  He believes his incisions are well healed.    Allergies  Allergen Reactions   Oxycodone  Nausea And Vomiting   Statins Other (See Comments)    Muscle and Bone pain    Amiodarone      Caused issues and was stopped by MD- possible was the cause for the patient's heart going in and out of rhythm multiple times   Donepezil Other (See Comments)    Per [facility] Insomnia   Imipramine Other (See Comments)    Per [facility]   Oxybutynin Chloride Swelling and Other (See Comments)    Per [facility]    Current Outpatient Medications  Medication Sig Dispense Refill   acetaminophen  (TYLENOL ) 500 MG tablet Take 1,000 mg by mouth daily as needed for moderate pain (pain score 4-6), headache or mild pain (pain score 1-3).     amLODipine  (NORVASC ) 10 MG tablet Take 10 mg by mouth daily.     apixaban  (ELIQUIS ) 5 MG TABS tablet Take 1 tablet (5 mg total) by mouth 2 (two) times daily. 60 tablet 11   Cholecalciferol  (VITAMIN D ) 50 MCG (2000 UT) tablet Take 2,000 Units by mouth daily.     clopidogrel  (PLAVIX ) 75 MG tablet Take 1 tablet by mouth daily.     ferrous sulfate  325 (65 FE) MG tablet Take 325 mg by mouth daily.     finasteride  (PROSCAR ) 5 MG tablet Take 5 mg by mouth daily.     hydrochlorothiazide  (MICROZIDE ) 12.5 MG capsule Take 12.5 mg by mouth daily.     lidocaine  (LIDODERM ) 5 % Place 1 patch onto the skin daily as needed (pain). Remove & Discard patch within 12 hours or as directed by MD      metoprolol  tartrate (LOPRESSOR ) 50 MG tablet Take 50 mg by mouth 2 (two) times daily.     nitroGLYCERIN  (NITROSTAT ) 0.4 MG SL tablet Place 0.4 mg under the tongue every 5 (five) minutes x 3 doses as needed for chest pain.     pantoprazole  (PROTONIX ) 40 MG tablet Take 40 mg by mouth daily before breakfast.     ramipril  (ALTACE ) 10 MG capsule Take 10 mg by mouth 2 (two) times daily.     rivastigmine  (EXELON ) 6 MG capsule Take 6 mg by mouth 2 (two) times daily.     rosuvastatin  (CRESTOR ) 10 MG tablet Take 1 tablet (10 mg total) by mouth daily. 30 tablet 11   Tiotropium Bromide-Olodaterol 2.5-2.5 MCG/ACT AERS Inhale 2 puffs into the lungs daily.     urea  (CARMOL) 10 % cream Apply 1 Application topically 2 (two) times daily.     vitamin B-12 (CYANOCOBALAMIN ) 500 MCG tablet Take 500 mcg by mouth daily.     No current facility-administered medications for this visit.     ROS:  See HPI  Physical Exam:  Vitals:   12/17/23 0803  BP: (!) 156/77  Pulse: 73  Temp: 97.9 F (36.6 C)  TempSrc: Temporal  SpO2: 95%  Weight: 166 lb (75.3 kg)  Height: 5' 9 (1.753 m)    Incision:  R groin and lower leg incisions healed Extremities:  palpable R PT pulse Neuro: somewhat confused but alert  Assessment/Plan:  This is a 83 y.o. male who is s/p: Redo redo right femoral to posterior tibial artery bypass with PTFE and external iliac artery balloon angioplasty due to critical limb ischemia  R foot well perfused with palpable PT pulse Incisions of the R leg have healed; staples removed; steri strips applied to lower leg incision Continue aspirin ; ok to discontinue plavix  from vascular standpoint; Eliquis  for atrial fibrillation per Cardiology Check RLE bypass duplex and ABI in 4-6 weeks   Donnice Sender, PA-C [facility] [phone]  Clinic MD:  Lanis

## 2023-12-15 NOTE — Telephone Encounter (Signed)
 Appt/Triage: Donald Gordon @ [facility] in [place] called about pt's post op appt (see previous triage note) -Reviewed chart & discussed with PA.  It is advised pt RTO this week if possible for f/u.  PA available 06/26 @ 8 am if possible -Discussed with Marval who is agreeable to bringing pt.  She will notified family of update.

## 2023-12-17 ENCOUNTER — Ambulatory Visit: Attending: Vascular Surgery | Admitting: Physician Assistant

## 2023-12-17 VITALS — BP 156/77 | HR 73 | Temp 97.9°F | Ht 69.0 in | Wt 166.0 lb

## 2023-12-17 DIAGNOSIS — I739 Peripheral vascular disease, unspecified: Secondary | ICD-10-CM

## 2023-12-17 DIAGNOSIS — I998 Other disorder of circulatory system: Secondary | ICD-10-CM

## 2023-12-18 ENCOUNTER — Other Ambulatory Visit: Payer: Self-pay | Admitting: *Deleted

## 2023-12-18 DIAGNOSIS — I739 Peripheral vascular disease, unspecified: Secondary | ICD-10-CM

## 2023-12-18 DIAGNOSIS — I998 Other disorder of circulatory system: Secondary | ICD-10-CM

## 2023-12-23 ENCOUNTER — Other Ambulatory Visit: Payer: Self-pay | Admitting: *Deleted

## 2023-12-23 NOTE — Patient Outreach (Signed)
 Mr. Reihl resides in [facility]. [facility] waiver utilized for admission.   Secure communication sent to Harlene, SNF social worker for collaboration about transition plans.  Will await response.   Pablo Hurst, MSN, RN, BSN [place]  [facility], [facility] RN Post- Acute Care Manager Direct Dial: [phone]

## 2023-12-24 ENCOUNTER — Encounter

## 2023-12-31 ENCOUNTER — Other Ambulatory Visit: Payer: Self-pay | Admitting: *Deleted

## 2023-12-31 NOTE — Patient Outreach (Signed)
 Post-Acute Care Manager follow-up. Mr. Munyon utilized [facility] waiver for admission.  Update received from Harlene [facility] social worker. Mr. Mcclenahan returned home on 12/26/23 with [facility].  PCP is the [place].  Pablo Hurst, MSN, RN, BSN [place]  [facility], [facility] RN Post- Acute Care Manager Direct Dial: [phone]

## 2024-01-07 ENCOUNTER — Ambulatory Visit: Attending: Vascular Surgery

## 2024-01-21 ENCOUNTER — Ambulatory Visit (HOSPITAL_BASED_OUTPATIENT_CLINIC_OR_DEPARTMENT_OTHER)
Admission: RE | Admit: 2024-01-21 | Discharge: 2024-01-21 | Disposition: A | Discharge status: Home / Self Care | Source: Ambulatory Visit | Attending: Vascular Surgery | Admitting: Vascular Surgery

## 2024-01-21 ENCOUNTER — Ambulatory Visit (HOSPITAL_COMMUNITY)
Admission: RE | Admit: 2024-01-21 | Discharge: 2024-01-21 | Disposition: A | Discharge status: Home / Self Care | Source: Ambulatory Visit | Attending: Vascular Surgery | Admitting: Vascular Surgery

## 2024-01-21 ENCOUNTER — Ambulatory Visit (INDEPENDENT_AMBULATORY_CARE_PROVIDER_SITE_OTHER): Admitting: Physician Assistant

## 2024-01-21 VITALS — BP 113/48 | HR 64 | Temp 97.7°F | Wt 170.9 lb

## 2024-01-21 DIAGNOSIS — I739 Peripheral vascular disease, unspecified: Secondary | ICD-10-CM | POA: Diagnosis present

## 2024-01-21 DIAGNOSIS — I70229 Atherosclerosis of native arteries of extremities with rest pain, unspecified extremity: Secondary | ICD-10-CM | POA: Diagnosis present

## 2024-01-21 DIAGNOSIS — I998 Other disorder of circulatory system: Secondary | ICD-10-CM | POA: Insufficient documentation

## 2024-01-21 DIAGNOSIS — Z7901 Long term (current) use of anticoagulants: Secondary | ICD-10-CM | POA: Diagnosis not present

## 2024-01-21 DIAGNOSIS — Z7902 Long term (current) use of antithrombotics/antiplatelets: Secondary | ICD-10-CM | POA: Diagnosis not present

## 2024-01-21 DIAGNOSIS — E1151 Type 2 diabetes mellitus with diabetic peripheral angiopathy without gangrene: Secondary | ICD-10-CM | POA: Insufficient documentation

## 2024-01-21 DIAGNOSIS — Z9889 Other specified postprocedural states: Secondary | ICD-10-CM

## 2024-01-21 LAB — VAS US ABI WITH/WO TBI
Left ABI: 0.95
Right ABI: 1.06

## 2024-01-21 NOTE — Progress Notes (Signed)
 POST OPERATIVE OFFICE NOTE    CC:  F/u for surgery  HPI: Donald Gordon is a 83 y.o. male who is here for postop visit.  He recently underwent right distal external iliac artery balloon angioplasty, right greater saphenous vein saphenectomy, right posterior tibial artery vein patch, and right femoral to posterior tibial artery bypass with PTFE on 11/20/2023 by Dr. Lanis.  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  He returns today for follow-up.  He says that he is doing well.  He denies any recurrent rest pain in the right foot.  He also denies any tissue loss or claudication.  He takes a daily Plavix , Eliquis , and statin.   Allergies  Allergen Reactions   Oxycodone  Nausea And Vomiting   Statins Other (See Comments)    Muscle and Bone pain    Amiodarone      Caused issues and was stopped by MD- possible was the cause for the patient's heart going in and out of rhythm multiple times   Donepezil Other (See Comments)    Per [facility] Insomnia   Imipramine Other (See Comments)    Per [facility]   Oxybutynin Chloride Swelling and Other (See Comments)    Per [facility]    Current Outpatient Medications  Medication Sig Dispense Refill   acetaminophen  (TYLENOL ) 500 MG tablet Take 1,000 mg by mouth daily as needed for moderate pain (pain score 4-6), headache or mild pain (pain score 1-3).     amLODipine  (NORVASC ) 10 MG tablet Take 10 mg by mouth daily.     apixaban  (ELIQUIS ) 5 MG TABS tablet Take 1 tablet (5 mg total) by mouth 2 (two) times daily. 60 tablet 11   Cholecalciferol  (VITAMIN D ) 50 MCG (2000 UT) tablet Take 2,000 Units by mouth daily.     clopidogrel  (PLAVIX ) 75 MG tablet Take 1 tablet by mouth daily.     ferrous sulfate  325 (65 FE) MG tablet Take 325 mg by mouth daily.     finasteride  (PROSCAR ) 5 MG tablet Take 5 mg by mouth daily.     hydrochlorothiazide  (MICROZIDE ) 12.5 MG capsule Take 12.5  mg by mouth daily.     lidocaine  (LIDODERM ) 5 % Place 1 patch onto the skin daily as needed (pain). Remove & Discard patch within 12 hours or as directed by MD     metoprolol  tartrate (LOPRESSOR ) 50 MG tablet Take 50 mg by mouth 2 (two) times daily.     nitroGLYCERIN  (NITROSTAT ) 0.4 MG SL tablet Place 0.4 mg under the tongue every 5 (five) minutes x 3 doses as needed for chest pain.     pantoprazole  (PROTONIX ) 40 MG tablet Take 40 mg by mouth daily before breakfast.     ramipril  (ALTACE ) 10 MG capsule Take 10 mg by mouth 2 (two) times daily.     rivastigmine  (EXELON ) 6 MG capsule Take 6 mg by mouth 2 (two) times daily.     rosuvastatin  (CRESTOR ) 10 MG tablet Take 1 tablet (10 mg total) by mouth daily. 30 tablet 11   Tiotropium Bromide-Olodaterol 2.5-2.5 MCG/ACT AERS Inhale 2 puffs into the lungs daily.     urea  (CARMOL) 10 % cream Apply 1 Application topically 2 (two) times daily.     vitamin B-12 (CYANOCOBALAMIN ) 500 MCG tablet Take 500 mcg by mouth daily.     No current facility-administered medications for this visit.     ROS:  See HPI  Physical Exam:  Incision: Right lower extremity incisions well-healed Extremities: Right foot warm and well-perfused with 2+ PT pulse Neuro: Intact motor and sensation of right lower extremity  Studies: ABIs: (01/21/2024) +---------+------------------+-----+--------+--------+  Right   Rt Pressure (mmHg)IndexWaveformComment   +---------+------------------+-----+--------+--------+  Brachial 143                                      +---------+------------------+-----+--------+--------+  PTA     152               1.06 biphasic          +---------+------------------+-----+--------+--------+  DP      141               0.99 biphasic          +---------+------------------+-----+--------+--------+  Great Toe94                0.66 Abnormal          +---------+------------------+-----+--------+--------+    +---------+------------------+-----+-----------+-------+  Left    Lt Pressure (mmHg)IndexWaveform   Comment  +---------+------------------+-----+-----------+-------+  Brachial 142                                        +---------+------------------+-----+-----------+-------+  PTA     136               0.95 monophasic          +---------+------------------+-----+-----------+-------+  DP      111               0.78 multiphasic         +---------+------------------+-----+-----------+-------+  Great Toe71                0.50 Abnormal            +---------+------------------+-----+-----------+-------+   +-------+-----------+-----------+------------+------------+  ABI/TBIToday's ABIToday's TBIPrevious ABIPrevious TBI  +-------+-----------+-----------+------------+------------+  Right 1.06       0.66       0.18        absent        +-------+-----------+-----------+------------+------------+  Left  0.95       0.50       0.67        0.53          +-------+-----------+-----------+------------+------------+   RLE BPG duplex: (01/21/2024) Right Graft #1: Femoral to posterior tibial bypass graft  +------------------+--------+---------------+--------+---------------------  -+                   PSV cm/sStenosis       WaveformComments                 +------------------+--------+---------------+--------+---------------------  -+  Inflow           161     30-49% stenosisbiphasic                         +------------------+--------+---------------+--------+---------------------  -+  Prox Anastomosis  171                    biphasicturbulent                +------------------+--------+---------------+--------+---------------------  -+  Proximal Graft    79  biphasic                         +------------------+--------+---------------+--------+---------------------  -+  Mid Graft         48                      biphasic                         +------------------+--------+---------------+--------+---------------------  -+  Distal Graft      47                     biphasic                         +------------------+--------+---------------+--------+---------------------  -+  Distal Anastomosis45                     biphasic                         +------------------+--------+---------------+--------+---------------------  -+  Outflow          38                     biphasic94 cm/s further  distal  +------------------+--------+---------------+--------+---------------------  -+    Assessment/Plan:  This is a 83 y.o. male who is here for a postop visit  - He continues to do well after recent right femoral to posterior tibial artery bypass with PTFE for critical limb ischemia with rest pain -His right lower extremity incisions are well-healed - He denies any rest pain, claudication, or tissue loss in the right lower extremity - On exam he has an easily palpable right femoral pulse.  He also has an easily palpable right PT pulse - His right ABIs have drastically increased from 0.18 to 1.06 after his bypass.  Duplex demonstrates a patent bypass without stenosis.  There appears to be sluggish velocities at the outflow of 38 cm/s, however just distal to this his velocities pick up to 94 cm/s - Given that he is asymptomatic and continues to have a palpable PT pulse, we will not pursue angiogram at this time for his sluggish outflow velocities.  We will bring him back for follow-up in 1 month with repeat studies.  He is aware that if his velocities worsen or if he becomes symptomatic again, he will require angiogram   Ahmed Holster, PA-C [facility] [phone]   Call MD: Gretta

## 2024-01-22 ENCOUNTER — Other Ambulatory Visit: Payer: Self-pay | Admitting: *Deleted

## 2024-01-22 DIAGNOSIS — I739 Peripheral vascular disease, unspecified: Secondary | ICD-10-CM

## 2024-01-25 ENCOUNTER — Other Ambulatory Visit: Payer: Self-pay | Admitting: *Deleted

## 2024-01-25 ENCOUNTER — Other Ambulatory Visit: Payer: Self-pay | Admitting: Radiation Oncology

## 2024-01-25 DIAGNOSIS — R911 Solitary pulmonary nodule: Secondary | ICD-10-CM

## 2024-01-25 DIAGNOSIS — I739 Peripheral vascular disease, unspecified: Secondary | ICD-10-CM

## 2024-01-25 DIAGNOSIS — I70229 Atherosclerosis of native arteries of extremities with rest pain, unspecified extremity: Secondary | ICD-10-CM

## 2024-01-28 ENCOUNTER — Telehealth: Payer: Self-pay | Admitting: *Deleted

## 2024-01-28 NOTE — Telephone Encounter (Signed)
 Called patient to inform of CT for 02-25-24- arrival time 12 pm @ [facility]- patient to have no solid food - 2 hrs. Prior to scan, patient to receive results from Donald Husband on 02/29/24 @ 2 pm via telephone, lvm for a return call

## 2024-01-28 NOTE — Telephone Encounter (Signed)
 xxxxx

## 2024-02-01 ENCOUNTER — Ambulatory Visit: Admitting: Radiation Oncology

## 2024-02-05 NOTE — Telephone Encounter (Signed)
 In error

## 2024-02-25 ENCOUNTER — Ambulatory Visit (HOSPITAL_COMMUNITY)

## 2024-02-25 ENCOUNTER — Other Ambulatory Visit (HOSPITAL_COMMUNITY)

## 2024-02-25 ENCOUNTER — Ambulatory Visit

## 2024-02-26 ENCOUNTER — Telehealth: Payer: Self-pay | Admitting: *Deleted

## 2024-02-26 NOTE — Telephone Encounter (Signed)
 CALLED PATIENT'S SGO PEGGY MANESS AND LVM TO CALL ME REGARDING RESCHEDULING PATIENT'S CT AND TELEPHONE FU

## 2024-02-29 ENCOUNTER — Ambulatory Visit: Admitting: Radiation Oncology

## 2024-04-04 DIAGNOSIS — I160 Hypertensive urgency: Secondary | ICD-10-CM | POA: Diagnosis not present

## 2024-04-04 DIAGNOSIS — R262 Difficulty in walking, not elsewhere classified: Secondary | ICD-10-CM | POA: Diagnosis not present

## 2024-04-04 DIAGNOSIS — R531 Weakness: Secondary | ICD-10-CM | POA: Diagnosis not present

## 2024-04-04 DIAGNOSIS — I119 Hypertensive heart disease without heart failure: Secondary | ICD-10-CM | POA: Diagnosis not present

## 2024-04-04 DIAGNOSIS — I4891 Unspecified atrial fibrillation: Secondary | ICD-10-CM | POA: Diagnosis not present

## 2024-04-04 DIAGNOSIS — R2681 Unsteadiness on feet: Secondary | ICD-10-CM | POA: Diagnosis not present

## 2024-04-04 DIAGNOSIS — F0390 Unspecified dementia without behavioral disturbance: Secondary | ICD-10-CM | POA: Diagnosis not present

## 2024-04-04 DIAGNOSIS — Z91199 Patient's noncompliance with other medical treatment and regimen due to unspecified reason: Secondary | ICD-10-CM | POA: Diagnosis not present

## 2024-04-04 DIAGNOSIS — D649 Anemia, unspecified: Secondary | ICD-10-CM | POA: Diagnosis not present

## 2024-04-04 DIAGNOSIS — U071 COVID-19: Secondary | ICD-10-CM | POA: Diagnosis not present

## 2024-04-04 DIAGNOSIS — J449 Chronic obstructive pulmonary disease, unspecified: Secondary | ICD-10-CM | POA: Diagnosis not present

## 2024-04-04 DIAGNOSIS — I2510 Atherosclerotic heart disease of native coronary artery without angina pectoris: Secondary | ICD-10-CM | POA: Diagnosis not present

## 2024-04-04 DIAGNOSIS — Z743 Need for continuous supervision: Secondary | ICD-10-CM | POA: Diagnosis not present

## 2024-04-04 DIAGNOSIS — I480 Paroxysmal atrial fibrillation: Secondary | ICD-10-CM | POA: Diagnosis not present

## 2024-04-04 DIAGNOSIS — K219 Gastro-esophageal reflux disease without esophagitis: Secondary | ICD-10-CM | POA: Diagnosis not present

## 2024-04-04 DIAGNOSIS — J189 Pneumonia, unspecified organism: Secondary | ICD-10-CM | POA: Diagnosis not present

## 2024-04-04 DIAGNOSIS — I10 Essential (primary) hypertension: Secondary | ICD-10-CM | POA: Diagnosis not present

## 2024-04-04 DIAGNOSIS — N183 Chronic kidney disease, stage 3 unspecified: Secondary | ICD-10-CM | POA: Diagnosis not present

## 2024-04-04 DIAGNOSIS — E785 Hyperlipidemia, unspecified: Secondary | ICD-10-CM | POA: Diagnosis not present

## 2024-04-04 DIAGNOSIS — R2689 Other abnormalities of gait and mobility: Secondary | ICD-10-CM | POA: Diagnosis not present

## 2024-04-04 DIAGNOSIS — R41841 Cognitive communication deficit: Secondary | ICD-10-CM | POA: Diagnosis not present

## 2024-04-04 DIAGNOSIS — G4733 Obstructive sleep apnea (adult) (pediatric): Secondary | ICD-10-CM | POA: Diagnosis not present

## 2024-04-04 DIAGNOSIS — I739 Peripheral vascular disease, unspecified: Secondary | ICD-10-CM | POA: Diagnosis not present

## 2024-04-05 ENCOUNTER — Ambulatory Visit (HOSPITAL_COMMUNITY): Admission: RE | Admit: 2024-04-05 | Source: Ambulatory Visit

## 2024-04-05 ENCOUNTER — Ambulatory Visit (HOSPITAL_COMMUNITY)

## 2024-04-05 ENCOUNTER — Ambulatory Visit

## 2024-04-07 DIAGNOSIS — R262 Difficulty in walking, not elsewhere classified: Secondary | ICD-10-CM | POA: Diagnosis not present

## 2024-04-07 DIAGNOSIS — I119 Hypertensive heart disease without heart failure: Secondary | ICD-10-CM | POA: Diagnosis not present

## 2024-04-07 DIAGNOSIS — I4891 Unspecified atrial fibrillation: Secondary | ICD-10-CM | POA: Diagnosis not present

## 2024-04-07 DIAGNOSIS — J189 Pneumonia, unspecified organism: Secondary | ICD-10-CM | POA: Diagnosis not present

## 2024-04-14 DIAGNOSIS — F0390 Unspecified dementia without behavioral disturbance: Secondary | ICD-10-CM | POA: Diagnosis not present

## 2024-04-21 ENCOUNTER — Telehealth: Payer: Self-pay | Admitting: *Deleted

## 2024-04-21 NOTE — Telephone Encounter (Signed)
 CALLED PATIENT TO INQUIRE WHEN HE WOULD BE WILLING TO DO CT, LVM FOR A RETURN CALL

## 2024-05-16 ENCOUNTER — Telehealth: Payer: Self-pay | Admitting: *Deleted

## 2024-05-16 NOTE — Telephone Encounter (Signed)
 CALLED PATIENT'S DAUGHTER- [name] CHILDRESS TO ASK ABOUT RESCHEDULING HER DAD'S MISSED CT, LVM FOR A RETURN CALL

## 2024-06-24 ENCOUNTER — Telehealth: Payer: Self-pay | Admitting: *Deleted

## 2024-06-24 NOTE — Telephone Encounter (Signed)
 CALLED PATIENT TO ASK ABOUT RESCHEDULING MISSED SCAN, LVM FOR A RETURN CALL

## 2024-07-12 ENCOUNTER — Telehealth: Payer: Self-pay | Admitting: *Deleted

## 2024-07-12 NOTE — Telephone Encounter (Signed)
 CALLED PATIENT'S DAUGHTER TO ASK ABOUT RESCHEDULING MISSED SCAN, LVM FOR A RETURN CALL
